# Patient Record
Sex: Female | Born: 1940 | Race: White | Hispanic: No | Smoking: Former smoker
Health system: Southern US, Community
[De-identification: ages and names within clinical notes are randomized; demographics above are authoritative.]

## PROBLEM LIST (undated history)

## (undated) DIAGNOSIS — K5730 Diverticulosis of large intestine without perforation or abscess without bleeding: Secondary | ICD-10-CM

## (undated) DIAGNOSIS — I2510 Atherosclerotic heart disease of native coronary artery without angina pectoris: Secondary | ICD-10-CM

## (undated) DIAGNOSIS — N390 Urinary tract infection, site not specified: Secondary | ICD-10-CM

## (undated) DIAGNOSIS — K272 Acute peptic ulcer, site unspecified, with both hemorrhage and perforation: Secondary | ICD-10-CM

## (undated) DIAGNOSIS — I5189 Other ill-defined heart diseases: Secondary | ICD-10-CM

## (undated) DIAGNOSIS — M1990 Unspecified osteoarthritis, unspecified site: Secondary | ICD-10-CM

## (undated) DIAGNOSIS — G4733 Obstructive sleep apnea (adult) (pediatric): Secondary | ICD-10-CM

## (undated) DIAGNOSIS — R2689 Other abnormalities of gait and mobility: Secondary | ICD-10-CM

## (undated) DIAGNOSIS — K921 Melena: Secondary | ICD-10-CM

## (undated) DIAGNOSIS — J45909 Unspecified asthma, uncomplicated: Secondary | ICD-10-CM

## (undated) DIAGNOSIS — J4489 Other specified chronic obstructive pulmonary disease: Secondary | ICD-10-CM

## (undated) DIAGNOSIS — E854 Organ-limited amyloidosis: Secondary | ICD-10-CM

## (undated) DIAGNOSIS — K439 Ventral hernia without obstruction or gangrene: Secondary | ICD-10-CM

## (undated) DIAGNOSIS — I680 Cerebral amyloid angiopathy: Secondary | ICD-10-CM

## (undated) DIAGNOSIS — B009 Herpesviral infection, unspecified: Secondary | ICD-10-CM

## (undated) DIAGNOSIS — J449 Chronic obstructive pulmonary disease, unspecified: Secondary | ICD-10-CM

## (undated) HISTORY — DX: Organ-limited amyloidosis: I68.0

## (undated) HISTORY — DX: Other specified chronic obstructive pulmonary disease: J44.89

## (undated) HISTORY — DX: Obstructive sleep apnea (adult) (pediatric): G47.33

## (undated) HISTORY — DX: Acute peptic ulcer, site unspecified, with both hemorrhage and perforation: K27.2

## (undated) HISTORY — DX: Urinary tract infection, site not specified: N39.0

## (undated) HISTORY — PX: BUNIONECTOMY: SHX129

## (undated) HISTORY — DX: Chronic obstructive pulmonary disease, unspecified: J44.9

## (undated) HISTORY — DX: Other abnormalities of gait and mobility: R26.89

## (undated) HISTORY — DX: Morbid (severe) obesity due to excess calories: E66.01

## (undated) HISTORY — PX: TOTAL HIP ARTHROPLASTY: SHX124

## (undated) HISTORY — DX: Organ-limited amyloidosis: E85.4

## (undated) HISTORY — DX: Atherosclerotic heart disease of native coronary artery without angina pectoris: I25.10

## (undated) HISTORY — DX: Other ill-defined heart diseases: I51.89

## (undated) HISTORY — PX: APPENDECTOMY: SHX54

## (undated) HISTORY — PX: UMBILICAL HERNIA REPAIR: SHX196

## (undated) HISTORY — DX: Diverticulosis of large intestine without perforation or abscess without bleeding: K57.30

## (undated) HISTORY — DX: Unspecified osteoarthritis, unspecified site: M19.90

## (undated) HISTORY — PX: FOOT SURGERY: SHX648

## (undated) HISTORY — PX: TUBAL LIGATION: SHX77

## (undated) HISTORY — DX: Melena: K92.1

## (undated) HISTORY — DX: Herpesviral infection, unspecified: B00.9

## (undated) HISTORY — DX: Ventral hernia without obstruction or gangrene: K43.9

---

## 1969-01-21 HISTORY — PX: COSMETIC SURGERY: SHX468

## 1998-02-02 ENCOUNTER — Ambulatory Visit (HOSPITAL_COMMUNITY): Admission: RE | Admit: 1998-02-02 | Discharge: 1998-02-02 | Payer: Self-pay | Admitting: Internal Medicine

## 1999-05-10 ENCOUNTER — Encounter: Payer: Self-pay | Admitting: Internal Medicine

## 1999-05-10 ENCOUNTER — Ambulatory Visit (HOSPITAL_COMMUNITY): Admission: RE | Admit: 1999-05-10 | Discharge: 1999-05-10 | Payer: Self-pay | Admitting: Internal Medicine

## 2000-12-11 ENCOUNTER — Ambulatory Visit (HOSPITAL_COMMUNITY): Admission: RE | Admit: 2000-12-11 | Discharge: 2000-12-11 | Payer: Self-pay | Admitting: Internal Medicine

## 2000-12-11 ENCOUNTER — Encounter: Payer: Self-pay | Admitting: Internal Medicine

## 2002-02-18 ENCOUNTER — Encounter: Payer: Self-pay | Admitting: Internal Medicine

## 2002-02-18 ENCOUNTER — Ambulatory Visit (HOSPITAL_COMMUNITY): Admission: RE | Admit: 2002-02-18 | Discharge: 2002-02-18 | Payer: Self-pay | Admitting: Internal Medicine

## 2005-05-23 HISTORY — PX: TOTAL HIP ARTHROPLASTY: SHX124

## 2007-02-20 ENCOUNTER — Ambulatory Visit: Payer: Self-pay

## 2007-03-07 ENCOUNTER — Ambulatory Visit: Payer: Self-pay

## 2007-10-17 ENCOUNTER — Ambulatory Visit: Payer: Self-pay | Admitting: Family Medicine

## 2007-10-17 DIAGNOSIS — B009 Herpesviral infection, unspecified: Secondary | ICD-10-CM | POA: Insufficient documentation

## 2007-10-17 DIAGNOSIS — K921 Melena: Secondary | ICD-10-CM | POA: Insufficient documentation

## 2007-10-17 DIAGNOSIS — K439 Ventral hernia without obstruction or gangrene: Secondary | ICD-10-CM | POA: Insufficient documentation

## 2007-10-22 ENCOUNTER — Ambulatory Visit: Payer: Self-pay | Admitting: Gastroenterology

## 2007-10-29 ENCOUNTER — Ambulatory Visit: Payer: Self-pay | Admitting: Gastroenterology

## 2007-11-26 DIAGNOSIS — K5730 Diverticulosis of large intestine without perforation or abscess without bleeding: Secondary | ICD-10-CM | POA: Insufficient documentation

## 2007-11-27 ENCOUNTER — Ambulatory Visit: Payer: Self-pay | Admitting: Gastroenterology

## 2007-11-27 LAB — CONVERTED CEMR LAB
AST: 20 units/L (ref 0–37)
Alkaline Phosphatase: 84 units/L (ref 39–117)
BUN: 18 mg/dL (ref 6–23)
Basophils Absolute: 0 10*3/uL (ref 0.0–0.1)
Basophils Relative: 0.9 % (ref 0.0–1.0)
CO2: 28 meq/L (ref 19–32)
Chloride: 104 meq/L (ref 96–112)
Eosinophils Absolute: 0.4 10*3/uL (ref 0.0–0.7)
Ferritin: 37.2 ng/mL (ref 10.0–291.0)
GFR calc non Af Amer: 89 mL/min
HCT: 39.6 % (ref 36.0–46.0)
Hemoglobin: 13.6 g/dL (ref 12.0–15.0)
MCHC: 34.5 g/dL (ref 30.0–36.0)
Monocytes Relative: 11.6 % (ref 3.0–12.0)
Potassium: 4.1 meq/L (ref 3.5–5.1)
RBC: 4.39 M/uL (ref 3.87–5.11)
RDW: 13.4 % (ref 11.5–14.6)
TSH: 2.29 microintl units/mL (ref 0.35–5.50)
Total Bilirubin: 0.9 mg/dL (ref 0.3–1.2)
Vitamin B-12: 404 pg/mL (ref 211–911)

## 2007-12-17 ENCOUNTER — Ambulatory Visit: Payer: Self-pay | Admitting: Family Medicine

## 2007-12-31 ENCOUNTER — Ambulatory Visit: Payer: Self-pay | Admitting: Family Medicine

## 2008-01-02 LAB — CONVERTED CEMR LAB: Cholesterol: 229 mg/dL (ref 0–200)

## 2008-01-21 ENCOUNTER — Ambulatory Visit (HOSPITAL_COMMUNITY): Admission: RE | Admit: 2008-01-21 | Discharge: 2008-01-21 | Payer: Self-pay | Admitting: Family Medicine

## 2008-01-22 ENCOUNTER — Encounter (INDEPENDENT_AMBULATORY_CARE_PROVIDER_SITE_OTHER): Payer: Self-pay | Admitting: *Deleted

## 2008-05-21 ENCOUNTER — Telehealth: Payer: Self-pay | Admitting: Family Medicine

## 2008-05-21 ENCOUNTER — Ambulatory Visit: Payer: Self-pay | Admitting: Family Medicine

## 2008-05-21 DIAGNOSIS — J309 Allergic rhinitis, unspecified: Secondary | ICD-10-CM | POA: Insufficient documentation

## 2008-05-21 DIAGNOSIS — M25579 Pain in unspecified ankle and joints of unspecified foot: Secondary | ICD-10-CM | POA: Insufficient documentation

## 2008-06-18 ENCOUNTER — Ambulatory Visit: Payer: Self-pay | Admitting: Family Medicine

## 2008-06-18 ENCOUNTER — Encounter: Payer: Self-pay | Admitting: Family Medicine

## 2008-06-18 ENCOUNTER — Other Ambulatory Visit: Admission: RE | Admit: 2008-06-18 | Discharge: 2008-06-18 | Payer: Self-pay | Admitting: Family Medicine

## 2008-06-18 DIAGNOSIS — E7800 Pure hypercholesterolemia, unspecified: Secondary | ICD-10-CM | POA: Insufficient documentation

## 2008-06-20 ENCOUNTER — Encounter (INDEPENDENT_AMBULATORY_CARE_PROVIDER_SITE_OTHER): Payer: Self-pay | Admitting: *Deleted

## 2008-06-30 ENCOUNTER — Ambulatory Visit: Payer: Self-pay | Admitting: Family Medicine

## 2008-06-30 LAB — CONVERTED CEMR LAB
Bilirubin, Direct: 0.1 mg/dL (ref 0.0–0.3)
Calcium: 9.2 mg/dL (ref 8.4–10.5)
GFR calc Af Amer: 92 mL/min
HDL: 36.1 mg/dL — ABNORMAL LOW (ref >39.0)
Sodium: 139 meq/L (ref 135–145)
Total Bilirubin: 1.1 mg/dL (ref 0.3–1.2)
Total CHOL/HDL Ratio: 5.7
Total Protein: 7.4 g/dL (ref 6.0–8.3)
Triglycerides: 233 mg/dL (ref 0–149)
VLDL: 47 mg/dL — ABNORMAL HIGH (ref 0–40)

## 2008-08-26 ENCOUNTER — Ambulatory Visit: Payer: Self-pay | Admitting: Family Medicine

## 2008-09-30 ENCOUNTER — Encounter (INDEPENDENT_AMBULATORY_CARE_PROVIDER_SITE_OTHER): Payer: Self-pay | Admitting: *Deleted

## 2008-10-13 ENCOUNTER — Encounter: Payer: Self-pay | Admitting: Family Medicine

## 2008-11-04 ENCOUNTER — Encounter: Payer: Self-pay | Admitting: Family Medicine

## 2008-11-10 ENCOUNTER — Encounter: Payer: Self-pay | Admitting: Family Medicine

## 2009-01-27 ENCOUNTER — Ambulatory Visit (HOSPITAL_COMMUNITY): Admission: RE | Admit: 2009-01-27 | Discharge: 2009-01-27 | Payer: Self-pay | Admitting: Family Medicine

## 2009-01-28 ENCOUNTER — Ambulatory Visit: Payer: Self-pay | Admitting: Family Medicine

## 2009-01-29 LAB — CONVERTED CEMR LAB
Cholesterol: 188 mg/dL (ref 0–200)
LDL Cholesterol: 113 mg/dL — ABNORMAL HIGH (ref 0–99)
Triglycerides: 182 mg/dL — ABNORMAL HIGH (ref 0.0–149.0)
VLDL: 36.4 mg/dL (ref 0.0–40.0)

## 2009-03-02 ENCOUNTER — Encounter: Payer: Self-pay | Admitting: Family Medicine

## 2009-03-25 ENCOUNTER — Ambulatory Visit: Payer: Self-pay | Admitting: Family Medicine

## 2009-03-25 DIAGNOSIS — M79609 Pain in unspecified limb: Secondary | ICD-10-CM | POA: Insufficient documentation

## 2009-03-25 LAB — CONVERTED CEMR LAB
Ketones, urine, test strip: NEGATIVE
Nitrite: POSITIVE
Specific Gravity, Urine: 1.015
Urobilinogen, UA: 0.2

## 2009-04-15 ENCOUNTER — Telehealth: Payer: Self-pay | Admitting: Family Medicine

## 2009-06-04 ENCOUNTER — Telehealth: Payer: Self-pay | Admitting: Family Medicine

## 2009-07-13 ENCOUNTER — Ambulatory Visit: Payer: Self-pay | Admitting: Family Medicine

## 2009-07-14 LAB — CONVERTED CEMR LAB
Cholesterol: 162 mg/dL (ref 0–200)
HDL: 47.3 mg/dL (ref >39.00)
LDL Cholesterol: 96 mg/dL (ref 0–99)
Triglycerides: 96 mg/dL (ref 0.0–149.0)
VLDL: 19.2 mg/dL (ref 0.0–40.0)

## 2009-07-20 ENCOUNTER — Ambulatory Visit: Payer: Self-pay | Admitting: Family Medicine

## 2009-07-20 DIAGNOSIS — M259 Joint disorder, unspecified: Secondary | ICD-10-CM | POA: Insufficient documentation

## 2009-10-21 ENCOUNTER — Ambulatory Visit: Payer: Self-pay | Admitting: Family Medicine

## 2009-10-21 DIAGNOSIS — J018 Other acute sinusitis: Secondary | ICD-10-CM | POA: Insufficient documentation

## 2009-12-08 ENCOUNTER — Ambulatory Visit: Payer: Self-pay | Admitting: Family Medicine

## 2009-12-21 ENCOUNTER — Ambulatory Visit: Payer: Self-pay | Admitting: Family Medicine

## 2009-12-21 DIAGNOSIS — Z96649 Presence of unspecified artificial hip joint: Secondary | ICD-10-CM | POA: Insufficient documentation

## 2009-12-24 ENCOUNTER — Telehealth (INDEPENDENT_AMBULATORY_CARE_PROVIDER_SITE_OTHER): Payer: Self-pay | Admitting: *Deleted

## 2009-12-29 ENCOUNTER — Telehealth: Payer: Self-pay | Admitting: Family Medicine

## 2010-01-05 ENCOUNTER — Encounter: Payer: Self-pay | Admitting: Family Medicine

## 2010-01-19 ENCOUNTER — Encounter: Payer: Self-pay | Admitting: Family Medicine

## 2010-01-19 ENCOUNTER — Telehealth: Payer: Self-pay | Admitting: Family Medicine

## 2010-01-22 ENCOUNTER — Telehealth: Payer: Self-pay | Admitting: Family Medicine

## 2010-02-15 ENCOUNTER — Encounter: Payer: Self-pay | Admitting: Family Medicine

## 2010-02-23 ENCOUNTER — Encounter: Payer: Self-pay | Admitting: Family Medicine

## 2010-02-23 ENCOUNTER — Ambulatory Visit: Payer: Self-pay | Admitting: Family Medicine

## 2010-02-23 DIAGNOSIS — G2581 Restless legs syndrome: Secondary | ICD-10-CM | POA: Insufficient documentation

## 2010-02-23 DIAGNOSIS — R0600 Dyspnea, unspecified: Secondary | ICD-10-CM | POA: Insufficient documentation

## 2010-02-23 DIAGNOSIS — R0609 Other forms of dyspnea: Secondary | ICD-10-CM

## 2010-03-02 ENCOUNTER — Ambulatory Visit: Payer: Self-pay | Admitting: Family Medicine

## 2010-03-03 ENCOUNTER — Telehealth: Payer: Self-pay | Admitting: Family Medicine

## 2010-03-05 LAB — CONVERTED CEMR LAB
ALT: 19 units/L (ref 0–35)
AST: 18 units/L (ref 0–37)
Alkaline Phosphatase: 74 units/L (ref 39–117)
Bilirubin, Direct: 0.1 mg/dL (ref 0.0–0.3)
CO2: 25 meq/L (ref 19–32)
Chloride: 104 meq/L (ref 96–112)
Eosinophils Absolute: 0.2 10*3/uL (ref 0.0–0.7)
Ferritin: 30.5 ng/mL (ref 10.0–291.0)
HCT: 39.9 % (ref 36.0–46.0)
Lymphs Abs: 1.4 10*3/uL (ref 0.7–4.0)
MCHC: 34.3 g/dL (ref 30.0–36.0)
MCV: 92.9 fL (ref 78.0–100.0)
Monocytes Absolute: 0.5 10*3/uL (ref 0.1–1.0)
Neutrophils Relative %: 56.7 % (ref 43.0–77.0)
Platelets: 197 10*3/uL (ref 150.0–400.0)
Potassium: 4.3 meq/L (ref 3.5–5.1)
Sodium: 139 meq/L (ref 135–145)
Total CHOL/HDL Ratio: 5
Total Protein: 6.8 g/dL (ref 6.0–8.3)
Triglycerides: 118 mg/dL (ref 0.0–149.0)
Vitamin B-12: 264 pg/mL (ref 211–911)

## 2010-03-23 ENCOUNTER — Encounter: Payer: Self-pay | Admitting: Family Medicine

## 2010-03-23 ENCOUNTER — Ambulatory Visit (HOSPITAL_COMMUNITY): Admission: RE | Admit: 2010-03-23 | Discharge: 2010-03-23 | Payer: Self-pay | Admitting: Family Medicine

## 2010-04-05 ENCOUNTER — Telehealth: Payer: Self-pay | Admitting: Family Medicine

## 2010-05-25 ENCOUNTER — Ambulatory Visit: Admit: 2010-05-25 | Payer: Self-pay | Admitting: Family Medicine

## 2010-06-12 ENCOUNTER — Encounter: Payer: Self-pay | Admitting: Family Medicine

## 2010-06-14 ENCOUNTER — Encounter: Payer: Self-pay | Admitting: Family Medicine

## 2010-06-24 NOTE — Progress Notes (Signed)
Summary: tramadol  Phone Note Refill Request Message from:  [employer] on April 05, 2010 1:20 PM  Refills Requested: Medication #1:  tramadol   Supply Requested: 1 month [facility]   Method Requested: Electronic Initial call taken by: Benny Lennert CMA Duncan Dull),  April 05, 2010 1:21 PM  Follow-up for Phone Call        Not on med list...we have not prescribed for her since 05/2009..  call to determine what MD she has been getting this from or if she hasn't had any since 05/2009. Needs to stick with one prescribing MD.  Follow-up by: Kerby Nora MD,  April 05, 2010 10:58 PM  Additional Follow-up for Phone Call Additional follow up Details #1::        Patient has only took the medication you gave her in january she said that she was just taken excidrin and could get by and she said that she has recently started taken these at night. This helps her when she can not stand the pain anymore.Consuello Masse CMA   Additional Follow-up by: Benny Lennert CMA (AAMA),  April 06, 2010 1:00 PM    New/Updated Medications: TRAMADOL HCL 50 MG TABS (TRAMADOL HCL) 1 tab by mouth daily as needed pain Prescriptions: TRAMADOL HCL 50 MG TABS (TRAMADOL HCL) 1 tab by mouth daily as needed pain  #30 x 0   Entered and Authorized by:   Kerby Nora MD   Signed by:   Kerby Nora MD on 04/06/2010   Method used:   Telephoned to ...       [facility]* (retail)       [address]       [place], [place]  [address]       Ph: [phone]       Fax: [phone]   RxID:   [id number] TRAMADOL HCL 50 MG TABS (TRAMADOL HCL) 1 tab by mouth daily as needed pain  #30 x 0   Entered and Authorized by:   Kerby Nora MD   Signed by:   Kerby Nora MD on 04/06/2010   Method used:   Electronically to        [facility]* (retail)       [address]       [place], [place]  [address]       Ph: [phone]       Fax: [phone]   RxID:    [id number]

## 2010-06-24 NOTE — Assessment & Plan Note (Signed)
Summary: ? SINUS INFECTION   Vital Signs:  Patient profile:   70 year old female Height:      62 inches Weight:      178.25 pounds BMI:     32.72 Temp:     98.6 degrees F oral Pulse rate:   88 / minute Pulse rhythm:   regular BP sitting:   140 / 80  (left arm) Cuff size:   regular  Vitals Entered By: Linde Gillis CMA Duncan Dull) (October 21, 2009 3:24 PM) CC: ? sinus infection   History of Present Illness: 70 yo here for almost 2 weeks of sinus pressure and congestion. chills, no fever. No cough. No SOB or wheezing. Taking OTC sinus medication and Excedrin. No ear pain. No n/v/d.  Current Medications (verified): 1)  Melatonin 3 Mg  Caps (Melatonin) .... Take 1 Tab By Mouth At Bedtime 2)  Acyclovir 400 Mg Tabs (Acyclovir) .Marland Kitchen.. 1 By Mouth Three Times A Day 3)  Allegra 180 Mg Tabs (Fexofenadine Hcl) .... One Daily 4)  Etodolac 400 Mg Tabs (Etodolac) .... As Directed 5)  Fluticasone Propionate 50 Mcg/act Susp (Fluticasone Propionate) .... 2 Sprays Per Nostril Daily 6)  Amoxicillin 500 Mg Tabs (Amoxicillin) .Marland Kitchen.. 1 Tab By Mouth Two Times A Day X 10 Days  Allergies (verified): No Known Drug Allergies  Review of Systems      See HPI General:  Complains of chills; denies fatigue and fever. ENT:  Complains of postnasal drainage, sinus pressure, and sore throat. Resp:  Denies cough, shortness of breath, sputum productive, and wheezing.  Physical Exam  General:  Non toxic, NAD. Nose:  boggy turbinates. TTP throughout Mouth:  good dentition and pharyngeal erythema.   no exudates Lungs:  Normal respiratory effort, chest expands symmetrically. Lungs are clear to auscultation, no crackles or wheezes. Heart:  Normal rate and regular rhythm. S1 and S2 normal without gallop, murmur, click, rub or other extra sounds. Extremities:  no edmea  Psych:  Cognition and judgment appear intact. Alert and cooperative with normal attention span and concentration. No apparent delusions, illusions,  hallucinations   Impression & Recommendations:  Problem # 1:  OTHER ACUTE SINUSITIS (ICD-461.8) Assessment New Given duration and progression of symptoms, will treat for bacterial sinusitis with amoxicillin. See pt instructions for details. Her updated medication list for this problem includes:    Fluticasone Propionate 50 Mcg/act Susp (Fluticasone propionate) .Marland Kitchen... 2 sprays per nostril daily    Amoxicillin 500 Mg Tabs (Amoxicillin) .Marland Kitchen... 1 tab by mouth two times a day x 10 days  Complete Medication List: 1)  Melatonin 3 Mg Caps (Melatonin) .... Take 1 tab by mouth at bedtime 2)  Acyclovir 400 Mg Tabs (Acyclovir) .Marland Kitchen.. 1 by mouth three times a day 3)  Allegra 180 Mg Tabs (Fexofenadine hcl) .... One daily 4)  Etodolac 400 Mg Tabs (Etodolac) .... As directed 5)  Fluticasone Propionate 50 Mcg/act Susp (Fluticasone propionate) .... 2 sprays per nostril daily 6)  Amoxicillin 500 Mg Tabs (Amoxicillin) .Marland Kitchen.. 1 tab by mouth two times a day x 10 days  Patient Instructions: 1)  Take amoxicillin as directed.  Drink lots of fluids.  Treat sympotmatically with Mucinex, nasal saline irrigation, and Tylenol/Ibuprofen. Also try claritin D or zyrtec D over the counter- two times a day as needed ( have to sign for them at pharmacy). You can use warm compresses.  Cough suppressant at night. Call if not improving as expected in 5-7 days.  Prescriptions: AMOXICILLIN 500 MG TABS (  AMOXICILLIN) 1 tab by mouth two times a day x 10 days  #20 x 0   Entered and Authorized by:   Ruthe Mannan MD   Signed by:   Ruthe Mannan MD on 10/21/2009   Method used:   Electronically to        [facility] (retail)       [address]       [place], [place]  [address]       Ph: [phone]       Fax: [phone]   RxID:   [id number]   Current Allergies (reviewed today): No known allergies

## 2010-06-24 NOTE — Assessment & Plan Note (Signed)
Summary: CHECK LUMP ON SHOULDER/CLE   Vital Signs:  Patient profile:   70 year old female Height:      62 inches Weight:      178.2 pounds BMI:     32.71 Temp:     97.6 degrees F oral Pulse rate:   80 / minute Pulse rhythm:   regular BP sitting:   120 / 72  (left arm) Cuff size:   regular  Vitals Entered By: Benny Lennert CMA Duncan Dull) (July 20, 2009 10:51 AM)  History of Present Illness: Chief complaint check lump on shoulder  2 weeks ago noted lump on right shoulder. No pain in lump, no redness, no discharge. No shoulder  Also a lot of nasal congestion..cannot breath through her nse. No nasal discharge. Using sudafed, allegra. Feels it is due to allergy in house and hair shop..does not get worse outside. 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 Take 1 Tab By Mouth At Bedtime 2)  Acyclovir 400 Mg Tabs (Acyclovir) .Marland Kitchen.. 1 By Mouth Three Times A Day 3)  Allegra 180 Mg Tabs (Fexofenadine Hcl) .... One Daily 4)  Etodolac 400 Mg Tabs (Etodolac) .... As Directed 5)  Prednisone 10 Mg Tabs (Prednisone) .... 3 Tabs By Mouth Daily X 3 Days, Then 2 Tabs By Mouth Daily X 2 Days Then 1 Tab By Mouth Daily X 2 Days 6)  Fluticasone Propionate 50 Mcg/act Susp (Fluticasone Propionate) .... 2 Sprays Per Nostril Daily  Allergies (verified): No Known Drug Allergies PMH-FH-SH reviewed-no changes except  otherwise noted  Review of Systems General:  Denies fatigue and fever. ENT:  Complains of nasal congestion and postnasal drainage; denies decreased hearing, difficulty swallowing, ear discharge, nosebleeds, sinus pressure, and sore throat. CV:  Denies chest pain or discomfort. Resp:  Denies shortness of breath. GI:  Denies abdominal pain.  Physical Exam  General:  Overwieght a[[earinmg femael in NAD Head:  no maxillary sinus ttp Ears:  External ear exam shows no significant lesions or deformities.  Otoscopic examination reveals clear canals, tympanic membranes are intact bilaterally without bulging, retraction, inflammation or discharge. Hearing is grossly normal bilaterally. Nose:  B turbinates swollen and eryhtematous Mouth:  Oral mucosa and oropharynx without lesions or exudates.  Teeth in good repair. Neck:  no carotid bruit or thyromegaly no cervical or supraclavicular lymphadenopathy  Lungs:  Normal respiratory effort, chest expands symmetrically. Lungs are clear to auscultation, no crackles or wheezes. Heart:  Normal rate and regular rhythm. S1 and S2 normal without gallop, murmur, click, rub or other extra sounds. Msk:  right shoulder 1 inch diameter solft mass over AC joint. minimally mobile, no redness, no tenderness. 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 Treat with prednisone taper  and start nasal steroid spray.  Her updated medication list for this problem includes:    Allegra 180 Mg Tabs (Fexofenadine hcl) ..... One daily    Fluticasone Propionate 50 Mcg/act Susp (Fluticasone propionate) .Marland Kitchen... 2 sprays per nostril daily  Problem # 2:  OTHER SYMPTOMS REFERABLE TO SHOULDER JOINT (ICD-719.61) ? AC synovial cyst vs. subcutaneous cyst. Spoke with Dr. Lajoyce Corners.. he recommends having her  call there office for possible aspiration and cortisone injection for ? arthritis related fluid accumulation.   Complete Medication List: 1)  Melatonin 3 Mg Caps (Melatonin) .... Take 1 tab by mouth at bedtime 2)  Acyclovir 400 Mg Tabs (Acyclovir) .Marland Kitchen.. 1 by mouth three times a day 3)  Allegra 180 Mg Tabs (Fexofenadine hcl) .... One daily 4)  Etodolac 400 Mg Tabs (Etodolac) .... As directed 5)  Prednisone 10 Mg Tabs (Prednisone) .... 3 tabs by mouth daily x 3 days, then 2 tabs by mouth daily x 2 days then 1 tab by mouth daily x 2 days 6)  Fluticasone Propionate 50 Mcg/act Susp (Fluticasone propionate) .... 2 sprays per nostril daily  Patient Instructions: 1)  Start nasal steroid spray, and prednsione taper . 2)  Stop nasal afrin medicaiton. 3)  Call if no relief..do not start Afrin back. 4)  Call Dr. Lajoyce Corners for further eval of ? [facility] joint arthritis/cyst right shoulder.  Prescriptions: FLUTICASONE PROPIONATE 50 MCG/ACT SUSP (FLUTICASONE PROPIONATE) 2 sprays per nostril daily  #1 x 11   Entered and Authorized by:   Kerby Nora MD   Signed by:   Kerby Nora MD on 07/20/2009   Method used:   Electronically to        [facility]* (retail)       [address]       [place], [place]  [address]       Ph: [phone]       Fax: [phone]   RxID:   [id number] PREDNISONE 10 MG TABS (PREDNISONE) 3 tabs by mouth daily x 3 days, then 2 tabs by mouth daily x 2 days then 1 tab by mouth daily x 2 days  #15 x 0   Entered and Authorized by:   Kerby Nora MD   Signed by:   Kerby Nora MD on 07/20/2009   Method used:   Electronically to        [facility]* (retail)       [address]       [place], [place]  [address]       Ph: [phone]       Fax: [phone]   RxID:   [id number]   Current Allergies (reviewed today): No known allergies

## 2010-06-24 NOTE — Letter (Signed)
Summary: [employer] Additional Documentation Request   Imported By: Beau Fanny 01/07/2010 08:59:46  _____________________________________________________________________  External Attachment:    Type:   Image     Comment:   External Document

## 2010-06-24 NOTE — Progress Notes (Signed)
Summary: wants script for RLS  Phone Note Call from Patient Call back at [phone]   Caller: Patient Call For: Kerby Nora MD Summary of Call: Pt is requesting generic requip for restless leggs,  she wants to take 2 every night. She says she has discussed this with you before.   Uses [facility]. Initial call taken by: Lowella Petties CMA,  March 03, 2010 3:12 PM  Follow-up for Phone Call        I don't know what she means take two at night...we will try 3 mg at bedtime which is typical treatment dose.  Follow-up by: Kerby Nora MD,  March 05, 2010 8:27 AM  Additional Follow-up for Phone Call Additional follow up Details #1::        Patient advised.Consuello Masse CMA   Additional Follow-up by: Benny Lennert CMA Duncan Dull),  March 05, 2010 8:36 AM    New/Updated Medications: ROPINIROLE HCL 3 MG TABS (ROPINIROLE HCL) 1 tab by mouth at bedtime as needed restless legs. Prescriptions: ROPINIROLE HCL 3 MG TABS (ROPINIROLE HCL) 1 tab by mouth at bedtime as needed restless legs.  #30 x 3   Entered and Authorized by:   Kerby Nora MD   Signed by:   Kerby Nora MD on 03/05/2010   Method used:   Electronically to        [facility]* (retail)       [address]       [place], [place]  [address]       Ph: [phone]       Fax: [phone]   RxID:   [id number]

## 2010-06-24 NOTE — Letter (Signed)
Summary: [employer] Progress Report/[employer]   Imported By: Lanelle Bal 02/24/2010 10:31:30  _____________________________________________________________________  External Attachment:    Type:   Image     Comment:   External Document

## 2010-06-24 NOTE — Progress Notes (Signed)
Summary: regarding paper work for scooter  Phone Note Other Incoming   Caller: Shauna at the scooter store  [phone] x [phone] Summary of Call: Sales rep is calling regarding status of paper work on patients scooter.  Has this been done?               Lowella Petties CMA  December 29, 2009 12:02 PM   Follow-up for Phone Call        have faxed this multiple times ,called sales rep and got alternate fax number and will call back later to see if she got this information Follow-up by: Benny Lennert CMA Duncan Dull),  December 29, 2009 12:37 PM  Additional Follow-up for Phone Call Additional follow up Details #1::        shuna will call back when info recieved.Consuello Masse CMA   Additional Follow-up by: Benny Lennert CMA Duncan Dull),  December 29, 2009 12:52 PM    Additional Follow-up for Phone Call Additional follow up Details #2::    sales rep got information Follow-up by: Benny Lennert CMA Duncan Dull),  December 29, 2009 2:28 PM

## 2010-06-24 NOTE — Progress Notes (Signed)
Summary: Rx Ultracet  Phone Note Refill Request Call back at [phone] Message from:  [facility] on June 04, 2009 10:16 AM  Refills Requested: Medication #1:  DARVOCET-N 100 100-650 MG TABS 1 tab by mouth q 6 hours as needed breakthrough pain Darvocet off the market, patient would like to use Ultracet.  Please advise   Method Requested: Pick up at Office Initial call taken by: Benny Lennert CMA Duncan Dull),  June 04, 2009 10:17 AM  Follow-up for Phone Call        Notify pt that ultracet is brand and not on her formulary... it is the equivalent to tramadol with tylenol...to make more affordable, will send in tramadol and she can use tylenol OTC as needed.  Okay to call in ultracet if she prefers in same quantity.   Additional Follow-up for Phone Call Additional follow up Details #1::        Called patient at work number line busy x 3, will call again later.  Tried reaching her at home and her husband said she was at work.   Linde Gillis CMA Duncan Dull)  June 04, 2009 12:44 PM   Rx called to pharmacy and patient notified. Additional Follow-up by: Linde Gillis CMA Duncan Dull),  June 04, 2009 2:27 PM    New/Updated Medications: TRAMADOL HCL 50 MG TABS (TRAMADOL HCL) 1 tab by mouth every 6 hours as needed  for pain, limit use Prescriptions: TRAMADOL HCL 50 MG TABS (TRAMADOL HCL) 1 tab by mouth every 6 hours as needed  for pain, limit use  #30 x 0   Entered and Authorized by:   Kerby Nora MD   Signed by:   Kerby Nora MD on 06/04/2009   Method used:   Telephoned to ...       [facility]* (retail)       [address]       [place], [place]  [address]       Ph: [phone]       Fax: [phone]   RxID:   [id number]

## 2010-06-24 NOTE — Letter (Signed)
Summary: Product Description Form/[employer]   Imported By: Lanelle Bal 01/27/2010 13:19:56  _____________________________________________________________________  External Attachment:    Type:   Image     Comment:   External Document

## 2010-06-24 NOTE — Progress Notes (Signed)
Summary: form from scooter store  Phone Note Other Incoming   Caller: Shauna at the scooter store Summary of Call: Form is on your desk for signature.  This is the last paper they need signed so that pt can get her scooter.  They are asking to have this faxed back to them today. Initial call taken by: Lowella Petties CMA,  January 19, 2010 10:11 AM     Appended Document: form from scooter store Patient called wanting to know status of fax for scooter.  Spoke with Dr. Ermalene Searing, she does have the form and will complete it at the end of the day today.  Notified patients spouse via telephone.

## 2010-06-24 NOTE — Progress Notes (Signed)
Summary: scooter store needs a letter  Phone Note Other Incoming   Caller: Scooter store- [employer]  [phone] Summary of Call: The scooter store needs a letter from you stating that pt is basically chair bound.  Pt says she spends the majority of her time in a chair, due to difficulty with her getting around, and due to pain. Needs this so that insurance will approve scooter.  Please fax to [phone] Initial call taken by: Lowella Petties CMA,  January 22, 2010 3:53 PM  Follow-up for Phone Call        Please compose letter I will edit and sign.  Follow-up by: Kerby Nora MD,  January 22, 2010 4:07 PM  Additional Follow-up for Phone Call Additional follow up Details #1::        [facility] [employer] called back, said they have gotten their authorization from the insurance company and they wont need that letter. Additional Follow-up by: Lowella Petties CMA,  January 22, 2010 4:44 PM

## 2010-06-24 NOTE — Assessment & Plan Note (Signed)
Summary: legs feel jumpy/dlo   Vital Signs:  Patient profile:   70 year old female Height:      62 inches Weight:      184.0 pounds BMI:     33.78 Temp:     98.2 degrees F oral Pulse rate:   70 / minute Pulse rhythm:   regular BP sitting:   116 / 80  (left arm) Cuff size:   regular  Vitals Entered By: Benny Lennert CMA Duncan Dull) (February 23, 2010 11:01 AM)  History of Present Illness: Chief complaint legs feel jumpy  She feels that she has restless legs. Creepy crawly feeling some. Occ cramps.  Legs feel jumpy when resting at night..interfering with sleep at night.  Cannot sleep well at night.  COPD.Marland KitchenMarland KitchenHas noted when standing in shower..she feels SOB.  Some gradual increase in SOB with exertion in past 6 months to 1 year. No chest pain. No cough, no wheezing.  Mother with idiopathic respiratory fibrosis. No longer smoking but has many year history.  She feels she is due for DXA.  Problems Prior to Update: 1)  Dyspnea  (ICD-786.05) 2)  Special Screening For Osteoporosis  (ICD-V82.81) 3)  Restless Leg Syndrome  (ICD-333.94) 4)  Hip Replacement, Left, Hx of  (ICD-V43.64) 5)  Other Acute Sinusitis  (ICD-461.8) 6)  Other Symptoms Referable To Shoulder Joint  (ICD-719.61) 7)  Foot Pain, Bilateral  (ICD-729.5) 8)  Hypercholesterolemia  (ICD-272.0) 9)  Ankle Pain, Bilateral  (ICD-719.47) 10)  Allergic Rhinitis  (ICD-477.9) 11)  Screening For Lipoid Disorders  (ICD-V77.91) 12)  Other Screening Mammogram  (ICD-V76.12) 13)  Diverticulosis, Colon  (ICD-562.10) 14)  Hematochezia  (ICD-578.1) 15)  Herpes Simplex Infection, 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 Take 3 Tab By Mouth At Bedtime 2)  Acyclovir 400 Mg Tabs (Acyclovir) .Marland Kitchen.. 1 By Mouth Three Times A Day 3)  Etodolac 400 Mg Tabs (Etodolac) .... As Directed 4)  Fluticasone Propionate 50 Mcg/act Susp  (Fluticasone Propionate) .... 2 Sprays Per Nostril Daily  Allergies (verified): No Known Drug Allergies  Past History:  Past medical, surgical, family and social histories (including risk factors) reviewed, and no changes noted (except as noted below).  Past Medical History: Reviewed history from 11/27/2007 and no changes required. Current Problems:  DIVERTICULOSIS, COLON (ICD-562.10) HEMATOCHEZIA (ICD-578.1) HERPES SIMPLEX INFECTION, RECURRENT (ICD-054.9) VENTRAL HERNIA (ICD-553.20) COPD (ICD-496)  Past Surgical History: Reviewed history from 10/17/2007 and no changes required. B bunionectomy 2007 left hip replacement Appendectomy Tubal ligation tummy tuck 1970s  Family History: Reviewed history from 10/17/2007 and no changes required. father: CAD, Dm, kidney failure mother: idiopathic resp fibrosis, DM, CAD siblings healthy no cancer no MI less than 13  Social History: Reviewed history from 10/17/2007 and no changes required. Occupation: owns [employer] Alcohol use-yes, 2-3 glasses of wine a night Married 3 kids: healthy former smoker, 40 pack years  Regular exercise-no Diet: poor, skips meals, some fruit and veggies  Review of Systems General:  Complains of fatigue; denies fever. CV:  Denies chest pain or discomfort. Resp:  Complains of shortness of breath; denies cough, sputum productive, and wheezing. GI:  Denies abdominal pain. GU:  Denies dysuria.  Physical Exam  General:  NAD overweight  Ears:  External ear exam shows no significant lesions or deformities.  Otoscopic examination reveals clear canals, tympanic membranes are intact bilaterally without bulging, retraction, inflammation or  discharge. Hearing is grossly normal bilaterally. Nose:  External nasal examination shows no deformity or inflammation. Nasal mucosa are pink and moist without lesions or exudates. Mouth:  Oral mucosa and oropharynx without lesions or exudates.  Teeth in good  repair. Neck:  no carotid bruit or thyromegaly no cervical or supraclavicular lymphadenopathy  Lungs:  Normal respiratory effort, chest expands symmetrically. Lungs are clear to auscultation, no crackles or wheezes. Heart:  Normal rate and regular rhythm. S1 and S2 normal without gallop, murmur, click, rub or other extra sounds. Pulses:  R and L posterior tibial pulses are full and equal bilaterally  Extremities:  no edema  Skin:  Intact without suspicious lesions or rashes Psych:  Cognition and judgment appear intact. Alert and cooperative with normal attention span and concentration. No apparent delusions, illusions, hallucinations   Impression & Recommendations:  Problem # 1:  DYSPNEA (ICD-786.05)  PFTs today are in nml range...no clear COPD . no treatment needed. Will eval for other causes of SOB such as anemia and thyroid issue. No clear cardiac source/chest pain, but if labs are normal consider EKG/stress test/ECHO to eval further.  Orders: Spirometry w/Graph (94010)  Problem # 2:  RESTLESS LEG SYNDROME, MILD (ICD-333.94) Will eval with labs for secondary causes. Will try mustard for leg cramping. if not improving and labs nml...can cosider ropinorole to treat.   Problem # 3:  SPECIAL SCREENING FOR OSTEOPOROSIS (ICD-V82.81) Scheduled DXA.  Orders: Radiology Referral (Radiology)  Complete Medication List: 1)  Melatonin 3 Mg Caps (Melatonin) .... Take 3 tab by mouth at bedtime 2)  Acyclovir 400 Mg Tabs (Acyclovir) .Marland Kitchen.. 1 by mouth three times a day 3)  Etodolac 400 Mg Tabs (Etodolac) .... As directed 4)  Fluticasone Propionate 50 Mcg/act Susp (Fluticasone propionate) .... 2 sprays per nostril daily  Patient Instructions: 1)  Schedule in 1 month  CPX. 2)   Fasting lipids, CMET, TSH, cbc ferritin, B12 Dx 272.0, 333.94 3)  Can use 1 tsp mustard  for cramping. 4)  Referral Appointment Information 5)  Day/Date: 6)  Time: 7)  Place/MD: 8)  Address: 9)  Phone/Fax: 10)  Patient  given appointment information. Information/Orders faxed/mailed.   Current Allergies (reviewed today): No known allergies

## 2010-06-24 NOTE — Progress Notes (Signed)
  Phone Note Other Incoming   Request: Send information Action Taken: Software engineer of Call: Received an Attending Physician's Statement from [employer] requesting records. Request forwarded to [facility].

## 2010-06-24 NOTE — Assessment & Plan Note (Signed)
Summary: ACU FOR REACCURING SINUS INFECTION ANTI-BIOTIC IS NOT WORKING...   Vital Signs:  Patient profile:   70 year old female Weight:      178.25 pounds Temp:     98.7 degrees F oral Pulse rate:   70 / minute Pulse rhythm:   regular BP sitting:   140 / 80  (left arm) Cuff size:   regular  Vitals Entered ByJanee Morn CMA (December 08, 2009 9:51 AM) CC: Sinus infection   History of Present Illness: Seen 6/1 for acute sinusitis....given 10 days of amoxicillin.  Also took 24 amoxicillin  500 mg that she has on hand  Some improvement but continued congestion..purulent nasal disharge.  Left facial pain.  no fever. No ear pain.  no cough, minimal SOB.  No wheeze.    Tried sudafed x 7-10 days. Doing nasal saline irrigation, no mucinex.    ASlo of note...she is looking into buying a scooter...due to chronic ankle pain (no surgivcal otions..she cannot walk further than 200 feet without sitting. Not currently interested in wheelchair, walker, cane.  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 Take 3 Tab By Mouth At Bedtime 2)  Acyclovir 400 Mg Tabs (Acyclovir) .Marland Kitchen.. 1 By Mouth Three Times A Day 3)  Etodolac 400 Mg Tabs (Etodolac) .... As Directed 4)  Fluticasone Propionate 50 Mcg/act Susp (Fluticasone Propionate) .... 2 Sprays Per Nostril  Daily  Allergies (verified): No Known Drug Allergies  Past History:  Past medical, surgical, family and social histories (including risk factors) reviewed, and no changes noted (except as noted below).  Past Medical History: Reviewed history from 11/27/2007 and no changes required. Current Problems:  DIVERTICULOSIS, COLON (ICD-562.10) HEMATOCHEZIA (ICD-578.1) HERPES SIMPLEX INFECTION, RECURRENT (ICD-054.9) VENTRAL HERNIA (ICD-553.20) COPD (ICD-496)  Past Surgical History: Reviewed history from 10/17/2007 and no changes required. B bunionectomy 2007 left hip replacement Appendectomy Tubal ligation tummy tuck 1970s  Family History: Reviewed history from 10/17/2007 and no changes required. father: CAD, Dm, kidney failure mother: idiopathic resp fibrosis, DM, CAD siblings healthy no cancer no MI less than 57  Social History: Reviewed history from 10/17/2007 and no changes required. Occupation: owns [employer] Alcohol use-yes, 2-3 glasses of wine a night Married 3 kids: healthy former smoker, 40 pack years  Regular exercise-no Diet: poor, skips meals, some fruit and veggies  Review of Systems General:  Denies fatigue and fever. CV:  Denies chest pain or discomfort. Resp:  Denies wheezing.  Physical Exam  General:  Non toxic, NAD. Head:  left  maxillary sinus ttp Ears:  External ear exam shows no significant lesions or deformities.  Otoscopic examination reveals clear canals, tympanic membranes are intact bilaterally without bulging, retraction, inflammation or discharge. Hearing is grossly normal bilaterally. Nose:  boggy turbinates. TTP throughout Mouth:  good dentition and pharyngeal erythema.   no exudates Neck:  no carotid bruit or  thyromegaly no cervical or supraclavicular lymphadenopathy  Lungs:  Normal respiratory effort, chest expands symmetrically. Lungs are clear to auscultation, no crackles or wheezes. Heart:  Normal rate and regular rhythm. S1 and  S2 normal without gallop, murmur, click, rub or other extra sounds.   Impression & Recommendations:  Problem # 1:  OTHER ACUTE SINUSITIS (ICD-461.8) Persistant sinus infection...broaden antibitoic. Start mucolytic. OCntinue nasal saline irrigation.  Her updated medication list for this problem includes:    Fluticasone Propionate 50 Mcg/act Susp (Fluticasone propionate) .Marland Kitchen... 2 sprays per nostril daily    Avelox 400 Mg Tabs (Moxifloxacin hcl) .Marland Kitchen... 1 tab by mouth daily x 10 days  Complete Medication List: 1)  Melatonin 3 Mg Caps (Melatonin) .... Take 3 tab by mouth at bedtime 2)  Acyclovir 400 Mg Tabs (Acyclovir) .Marland Kitchen.. 1 by mouth three times a day 3)  Etodolac 400 Mg Tabs (Etodolac) .... As directed 4)  Fluticasone Propionate 50 Mcg/act Susp (Fluticasone propionate) .... 2 sprays per nostril daily 5)  Avelox 400 Mg Tabs (Moxifloxacin hcl) .Marland Kitchen.. 1 tab by mouth daily x 10 days  Patient Instructions: 1)  Continue nasal saline. 2)   Mucinex no decongestant two times a day . 3)  Complete antibiotics. 4)   Call if not improving as expected.  Prescriptions: AVELOX 400 MG TABS (MOXIFLOXACIN HCL) 1 tab by mouth daily x 10 days  #10 x 0   Entered and Authorized by:   Kerby Nora MD   Signed by:   Kerby Nora MD on 12/08/2009   Method used:   Electronically to        [facility]* (retail)       [address]       [place], [place]  [address]       Ph: [phone]       Fax: [phone]   RxID:   [id number]   Current Allergies (reviewed today): No known allergies

## 2010-06-24 NOTE — Assessment & Plan Note (Signed)
Summary: DISCUSS MOBILITY/RBH   Vital Signs:  Patient profile:   70 year old female Height:      62 inches Weight:      181.0 pounds BMI:     33.22 Temp:     97.9 degrees F oral Pulse rate:   70 / minute Pulse rhythm:   regular BP sitting:   118 / 78  (left arm) Cuff size:   regular  Vitals Entered By: Benny Lennert CMA Duncan Dull) (December 21, 2009 10:48 AM)  History of Present Illness: Chief complaint discuss mobility   Chronic ankle and foot  pain. Dx 10 years ago  Worse in last 2 years.  She has seen Dr. 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  She has multiple steroid injections, minimally helpful..last B  injections were in 10/2009. Waers daily B ankle stabilizing.orthosis  She also has hx of left hip replacement 4 years ago, also right knee arthritis.  No surgical otions..she cannot walk further than 200 feet without sitting. She has poor balance and poor endurance.  She cannot stand longer than 5-10 minutes.  Needs shower stool prescription.Marland Kitchenlifestime need.  Also needs prescription for scooter to use in public places.  Use walker or cane when walking in strange places,,,keeps in car.  Feels some SOB with COPD.Marland Kitchenin past..unable to exert herself for example with wheelchair. Worse SPOB with exeriton in last 2 months..cannot afford PFTs at this time.  No current chest pain.   Has bar to hold onto in bathtub  Allergies (verified): No Known Drug Allergies  Review of Systems General:  Denies fatigue and fever. CV:  Denies chest pain or discomfort. Resp:  Complains of shortness of breath; denies cough, sputum productive, and wheezing. GI:  Denies abdominal pain. GU:  Denies dysuria.  Physical Exam  General:  Non toxic, NAD overweight  Mouth:  Oral mucosa and oropharynx without lesions or exudates.  Teeth in good repair. Neck:  no carotid bruit or thyromegaly no  cervical or supraclavicular lymphadenopathy  Lungs:  Normal respiratory effort, chest expands symmetrically. Lungs are clear to auscultation, no crackles or wheezes. Heart:  Normal rate and regular rhythm. S1 and S2 normal without gallop, murmur, click, rub or other extra sounds. Msk:  B deformity of ankles anf feet, severe ankle pronation  pain diffuse palpation B ankles and feet.   Wearing B braces  antalgic gait no swelling B ankles Pulses:  R and L posterior tibial pulses are full and equal bilaterally  Neurologic:  No cranial nerve deficits noted. Station and gait are normal. Plantar reflexes are down-going bilaterally. DTRs are symmetrical throughout. Sensory, motor and coordinative functions appear intact.   Impression & Recommendations:  Problem # 1:  FOOT PAIN, BILATERAL (ICD-729.5) Total visit time > 50% spent counseling and cordinating patients care  completed mobiltiy paperwork.  Pt has a lifestime need of a moterized scooter due to severe foot and ankle issues ..no surgical indications. She is unable to use walker, cance or wheelchair manual due to shortness of breath with exertion die to COPD history.  Sees ORTHO, Dr. Lajoyce Corners.  Problem # 2:  ANKLE PAIN, BILATERAL (ICD-719.47)  Problem # 3:  COPD (ICD-496) Needs reeval when able with PFTs to determine if further meds needed such as spiriva.   Complete Medication List: 1)  Melatonin 3 Mg Caps (Melatonin) .... Take 3 tab by mouth at bedtime 2)  Acyclovir 400 Mg  Tabs (Acyclovir) .Marland Kitchen.. 1 by mouth three times a day 3)  Etodolac 400 Mg Tabs (Etodolac) .... As directed 4)  Fluticasone Propionate 50 Mcg/act Susp (Fluticasone propionate) .... 2 sprays per nostril daily  Current Allergies (reviewed today): No known allergies

## 2010-08-23 ENCOUNTER — Other Ambulatory Visit: Payer: Self-pay | Admitting: Family Medicine

## 2010-08-25 ENCOUNTER — Other Ambulatory Visit: Payer: Self-pay | Admitting: Family Medicine

## 2010-08-30 ENCOUNTER — Ambulatory Visit (INDEPENDENT_AMBULATORY_CARE_PROVIDER_SITE_OTHER): Payer: Medicare Other | Admitting: Family Medicine

## 2010-08-30 ENCOUNTER — Encounter: Payer: Self-pay | Admitting: Family Medicine

## 2010-08-30 VITALS — BP 120/80 | HR 91 | Temp 98.4°F | Ht 63.0 in | Wt 174.8 lb

## 2010-08-30 DIAGNOSIS — K5730 Diverticulosis of large intestine without perforation or abscess without bleeding: Secondary | ICD-10-CM

## 2010-08-30 DIAGNOSIS — K5792 Diverticulitis of intestine, part unspecified, without perforation or abscess without bleeding: Secondary | ICD-10-CM

## 2010-08-30 DIAGNOSIS — K5732 Diverticulitis of large intestine without perforation or abscess without bleeding: Secondary | ICD-10-CM

## 2010-08-30 MED ORDER — METRONIDAZOLE 500 MG PO TABS: 500.0000 mg | ORAL_TABLET | Freq: Three times a day (TID) | ORAL | Status: AC

## 2010-08-30 MED ORDER — CIPROFLOXACIN HCL 750 MG PO TABS: 750.0000 mg | ORAL_TABLET | Freq: Two times a day (BID) | ORAL | Status: AC

## 2010-08-30 NOTE — Patient Instructions (Signed)
Prevent dehydration: drink Gatorade, Pedialyte, Ginger Ale, popsicles  day 1: clear liquids day 1: clear liquids--2-3 oz every 45-60 min. SMALL SIPS OR ICE CHIPS 7-up, ginger ale, sprite tea--no cofee chicken broth plain jello Water  Day 2: contnue clear liquids and add BRAT diet B--banana R--rice---can use chicken noodle or rice soup A--apple sauce T--dry toast GRITS, CREAM OF WHEAT, OATMEAL OK  Day 3: continue day 2 and add simple, non fat non spicy foods1 at a time to diet as canned peaches or pears backed or broiled chicken breast---or lunch meat boiled white potato-cook in chicken broth Chicken and rice or chicken noodles  Diverticulitis Small pockets or "bubbles" can develop in the wall of the intestine. These are called diverticuli. If they become infected and inflamed, the problem is called diverticulitis. This causes belly pain (usually on the left side). There may also be:   Fever.   Bloating.   Gas.   Feeling sick to your stomach (nausea).   Loss of hunger.  Some people with this problem have watery poop (diarrhea). Others feel that they cannot poop (constipated).  HOME CARE  Only take medicine as told by your doctor. If you have been given medicines (antibiotics) that kill germs, finish all the medicine. Do this even if you feel better.   For 24 to 48 hours, drink only clear liquids, such as:   Water.   Fruit juices with no pulp.   Clear broth.   Frozen ice pops.   Drink enough water and fluids to keep your pee clear or pale yellow.   Eat more fiber (whole grains, fruits, and vegetables). Eat a little more each day as you feel better.   Go to the bathroom when you have the urge to go.   Avoid using medicine that makes you go poop (laxatives).  GET HELP RIGHT AWAY IF YOU:  Are not getting better after taking medicine (antibiotics) for 2 days.   Notice bright red blood in your poop (stool).   Develop very bad belly (abdominal) pain.   You  have a temperature by mouth above 102, not controlled by medicine.   Cannot poop.   Keep throwing up (vomiting).  MAKE SURE YOU:   Understand these instructions.   Will watch your condition.   Will get help right away if you are not doing well or get worse.  Document Released: 10/26/2007 Document Re-Released: 08/03/2009 [facility] Patient Information 2011 [place], [place].

## 2010-08-30 NOTE — Progress Notes (Signed)
70 year old female:  About three years ago, ws bleeding from colon.  Showed up and was having some irritation on her colonoscopy. Eating lots of yogurt. For the last few days. Diarrhea, slimy.  Sore in her abdomen. Trying to massage her stomace.  Like okra - mucous.   Diverticulitis: Patient complains of anorexia, diarrhea, LLQ abdominal pain and mucus in the stool.  The pain is described as aching, and is a few/10 in intensity. Onset was 3 days ago. Symptoms have been stable since. Aggravating factors: activity, bowel movement, eating and movement.  Alleviating factors: none. Associated symptoms: anorexia, diarrhea and nausea. The patient denies chills, constipation, diffuse abdominal pain, fever, hematochezia, melena, night sweats and vomiting.  The PMH, PSH, Social History, Family History, Medications, and allergies have been reviewed in [facility], and have been updated if relevant.  ROS: GEN: No acute illnesses, no fevers, chills. GI: above Pulm: No SOB Interactive and getting along well at home.  Otherwise, ROS is as per the HPI.  GEN: WDWN, NAD, Non-toxic, A & O x 3 HEENT: Atraumatic, Normocephalic. Neck supple. No masses, No LAD. Ears and Nose: No external deformity. CV: RRR, No M/G/R. No JVD. No thrill. No extra heart sounds. PULM: CTA B, no wheezes, crackles, rhonchi. No retractions. No resp. distress. No accessory muscle use. ABD: S, TTP LLQ, ND,  HYPERACTIVE BS. No rebound tenderness. No HSM.  EXTR: No c/c/e NEURO Normal gait.  PSYCH: Normally interactive. Conversant. Not depressed or anxious appearing.  Calm demeanor.   A/P: 1. Diverticulitis in the setting of known diverticulosis. Cipro and Flagyl, bowel rest. Refer to the patient instructions sections for details of plan shared with patient.

## 2010-09-01 ENCOUNTER — Other Ambulatory Visit: Payer: Self-pay | Admitting: Neurosurgery

## 2010-09-01 DIAGNOSIS — M47812 Spondylosis without myelopathy or radiculopathy, cervical region: Secondary | ICD-10-CM

## 2010-09-06 ENCOUNTER — Ambulatory Visit
Admission: RE | Admit: 2010-09-06 | Discharge: 2010-09-06 | Disposition: A | Discharge status: Home / Self Care | Payer: Medicare Other | Source: Ambulatory Visit | Attending: Neurosurgery | Admitting: Neurosurgery

## 2010-09-06 DIAGNOSIS — M47812 Spondylosis without myelopathy or radiculopathy, cervical region: Secondary | ICD-10-CM

## 2010-09-10 ENCOUNTER — Other Ambulatory Visit: Payer: Self-pay | Admitting: Neurosurgery

## 2010-09-10 DIAGNOSIS — M47812 Spondylosis without myelopathy or radiculopathy, cervical region: Secondary | ICD-10-CM

## 2010-09-15 ENCOUNTER — Other Ambulatory Visit: Payer: Self-pay | Admitting: *Deleted

## 2010-09-15 MED ORDER — ACYCLOVIR 400 MG PO TABS: 400.0000 mg | ORAL_TABLET | Freq: Two times a day (BID) | ORAL | Status: DC

## 2010-09-15 NOTE — Telephone Encounter (Signed)
Has this been done?

## 2010-09-20 ENCOUNTER — Ambulatory Visit (INDEPENDENT_AMBULATORY_CARE_PROVIDER_SITE_OTHER): Payer: Medicare Other | Admitting: Family Medicine

## 2010-09-20 ENCOUNTER — Other Ambulatory Visit: Payer: Self-pay | Admitting: Neurosurgery

## 2010-09-20 ENCOUNTER — Encounter: Payer: Self-pay | Admitting: Family Medicine

## 2010-09-20 ENCOUNTER — Ambulatory Visit
Admission: RE | Admit: 2010-09-20 | Discharge: 2010-09-20 | Disposition: A | Discharge status: Home / Self Care | Payer: Medicare Other | Source: Ambulatory Visit | Attending: Neurosurgery | Admitting: Neurosurgery

## 2010-09-20 VITALS — BP 144/82 | HR 88 | Temp 98.5°F | Wt 176.0 lb

## 2010-09-20 DIAGNOSIS — J40 Bronchitis, not specified as acute or chronic: Secondary | ICD-10-CM

## 2010-09-20 DIAGNOSIS — M47812 Spondylosis without myelopathy or radiculopathy, cervical region: Secondary | ICD-10-CM

## 2010-09-20 MED ORDER — AZITHROMYCIN 250 MG PO TABS: ORAL_TABLET | ORAL | Status: DC

## 2010-09-20 NOTE — Patient Instructions (Addendum)
Keep taking the mucinex and tylenol as needed.  If your aren't getting better by the end of the week, then start the antibiotics.  Let us know if you have other concerns.     09/21/2010, letter in patient's chart about being out of work is incorrect and not needed, can't cancel. Please dis-regard, per Dr.Luzmaria Devaux.  D.Smith, CMA

## 2010-09-20 NOTE — Assessment & Plan Note (Addendum)
Nontoxic, possibly viral.  Hold abx in meantime.  Use mucinex, tylenol prn.  Okay for outpatient fu. See instructions.

## 2010-09-20 NOTE — Progress Notes (Signed)
Friday with chest congestion.  Since then with fever, episodically, at night.  Some sputum, yellow.  Rhinorrhea, congested.  Some ear congestion.  Quit smoking 2002.  "I don't feel bad, it's just annoying."    ROS: See HPI.  Otherwise negative.    Meds, vitals, and allergies reviewed.   GEN: nad, alert and oriented, nontoxic.  HEENT: mucous membranes moist, TM w/o erythema, nasal epithelium injected, OP with cobblestoning NECK: supple w/o LA CV: rrr. PULM: no inc wob but scattered ronchi, no wheeze ABD: soft, +bs EXT: no edema

## 2010-10-05 ENCOUNTER — Ambulatory Visit
Admission: RE | Admit: 2010-10-05 | Discharge: 2010-10-05 | Disposition: A | Discharge status: Home / Self Care | Payer: Medicare Other | Source: Ambulatory Visit | Attending: Neurosurgery | Admitting: Neurosurgery

## 2010-10-05 DIAGNOSIS — M47812 Spondylosis without myelopathy or radiculopathy, cervical region: Secondary | ICD-10-CM

## 2010-10-19 ENCOUNTER — Telehealth (INDEPENDENT_AMBULATORY_CARE_PROVIDER_SITE_OTHER): Payer: Medicare Other | Admitting: Family Medicine

## 2010-10-19 ENCOUNTER — Ambulatory Visit: Payer: Medicare Other | Admitting: Family Medicine

## 2010-10-19 DIAGNOSIS — E7800 Pure hypercholesterolemia, unspecified: Secondary | ICD-10-CM

## 2010-10-19 NOTE — Telephone Encounter (Signed)
Message copied by Excell Seltzer on Tue Oct 19, 2010  8:46 AM ------      Message from: Baldomero Lamy      Created: Wed Oct 13, 2010 10:23 AM      Regarding: Cpx labs wed 5/30       Please order  future cpx labs for pt's upcomming lab appt.      Thanks      Rodney Booze

## 2010-10-20 ENCOUNTER — Other Ambulatory Visit (INDEPENDENT_AMBULATORY_CARE_PROVIDER_SITE_OTHER): Payer: Medicare Other | Admitting: Family Medicine

## 2010-10-20 DIAGNOSIS — E7800 Pure hypercholesterolemia, unspecified: Secondary | ICD-10-CM

## 2010-10-20 LAB — LIPID PANEL
Cholesterol: 203 mg/dL — ABNORMAL HIGH (ref 0–200)
VLDL: 19.8 mg/dL (ref 0.0–40.0)

## 2010-10-20 LAB — COMPREHENSIVE METABOLIC PANEL
ALT: 21 U/L (ref 0–35)
CO2: 25 mEq/L (ref 19–32)
Calcium: 8.8 mg/dL (ref 8.4–10.5)
Chloride: 109 mEq/L (ref 96–112)
Creatinine, Ser: 0.8 mg/dL (ref 0.4–1.2)
GFR: 79 mL/min (ref >60.00)
Glucose, Bld: 91 mg/dL (ref 70–99)
Sodium: 140 mEq/L (ref 135–145)
Total Bilirubin: 0.8 mg/dL (ref 0.3–1.2)
Total Protein: 6.7 g/dL (ref 6.0–8.3)

## 2010-10-20 LAB — LDL CHOLESTEROL, DIRECT: Direct LDL: 146.6 mg/dL

## 2010-10-26 ENCOUNTER — Telehealth: Payer: Self-pay | Admitting: Family Medicine

## 2010-10-26 ENCOUNTER — Ambulatory Visit (INDEPENDENT_AMBULATORY_CARE_PROVIDER_SITE_OTHER): Payer: Medicare Other | Admitting: Family Medicine

## 2010-10-26 ENCOUNTER — Ambulatory Visit (INDEPENDENT_AMBULATORY_CARE_PROVIDER_SITE_OTHER)
Admission: RE | Admit: 2010-10-26 | Discharge: 2010-10-26 | Disposition: A | Discharge status: Home / Self Care | Payer: Medicare Other | Source: Ambulatory Visit | Attending: Family Medicine | Admitting: Family Medicine

## 2010-10-26 ENCOUNTER — Encounter: Payer: Self-pay | Admitting: Family Medicine

## 2010-10-26 DIAGNOSIS — R0600 Dyspnea, unspecified: Secondary | ICD-10-CM

## 2010-10-26 DIAGNOSIS — R0609 Other forms of dyspnea: Secondary | ICD-10-CM

## 2010-10-26 DIAGNOSIS — IMO0002 Reserved for concepts with insufficient information to code with codable children: Secondary | ICD-10-CM | POA: Insufficient documentation

## 2010-10-26 DIAGNOSIS — R0989 Other specified symptoms and signs involving the circulatory and respiratory systems: Secondary | ICD-10-CM

## 2010-10-26 DIAGNOSIS — R0602 Shortness of breath: Secondary | ICD-10-CM

## 2010-10-26 DIAGNOSIS — G2581 Restless legs syndrome: Secondary | ICD-10-CM

## 2010-10-26 DIAGNOSIS — Z0000 Encounter for general adult medical examination without abnormal findings: Secondary | ICD-10-CM

## 2010-10-26 DIAGNOSIS — E7800 Pure hypercholesterolemia, unspecified: Secondary | ICD-10-CM

## 2010-10-26 NOTE — Assessment & Plan Note (Signed)
Start red yeast rice. Work on exercise as tolerated and low chol diet.  Recheck in 6 months.

## 2010-10-26 NOTE — Progress Notes (Signed)
Subjective:    Patient ID: Susan [name], female    DOB: 1940-11-10, 70 y.o.   MRN: [id number]  HPI I have personally reviewed the Medicare Annual Wellness questionnaire and have noted 1. The patient's medical and social history 2. Their use of alcohol, tobacco or illicit drugs 3. Their current medications and supplements 4. The patient's functional ability including ADL's, fall risks, home safety risks and hearing or visual             impairment. 5. Diet and physical activities 6. Evidence for depression or mood disorders The patients weight, height, BMI and visual acuity have been recorded in the chart I have made referrals, counseling and provided education to the patient based review of the above and I have provided the pt with a written personalized care plan for preventive services.  Elevated Cholesterol:inadequate control, goal <130 On no medications.  Right arm and neck pain: Had MRI of neck ordered by Dr. Channing Mutters. 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 Had cortisone injections x 3 in neck... Has helped some.  Husband recently had a CVA currently in rehab.  She is having trouble sleeping. Using medication (requip 3 mg) for restless legs, this helps a lot.  She reports that she is not SOB at rest, but still some dyspnea on exertion. Has had nml lung function tests. He mother had pulmonary fibrosis. She is requesting a CXR today. She has a history of smoking, 40 pack years.       Review of Systems  Constitutional: Negative for fever and fatigue.  HENT: Negative for ear pain.   Eyes: Negative for pain.  Respiratory: Positive for shortness of breath. Negative for chest tightness.   Cardiovascular: Negative for chest pain, palpitations and leg swelling.  Gastrointestinal: Negative for abdominal pain.  Genitourinary: Negative for dysuria.       Objective:   Physical Exam  Constitutional: Vital signs are normal. She appears well-developed and well-nourished. She is  cooperative.  Non-toxic appearance. She does not appear ill. No distress.  HENT:  Head: Normocephalic.  Right Ear: Hearing, tympanic membrane, external ear and ear canal normal.  Left Ear: Hearing, tympanic membrane, external ear and ear canal normal.  Nose: Nose normal.  Eyes: Conjunctivae, EOM and lids are normal. Pupils are equal, round, and reactive to light. No foreign bodies found.  Neck: Trachea normal and normal range of motion. Neck supple. Carotid bruit is not present. No mass and no thyromegaly present.  Cardiovascular: Normal rate, regular rhythm, S1 normal, S2 normal, normal heart sounds and intact distal pulses.  Exam reveals no gallop.   No murmur heard. Pulmonary/Chest: Effort normal and breath sounds normal. No respiratory distress. She has no wheezes. She has no rhonchi. She has no rales.  Abdominal: Soft. Normal appearance and bowel sounds are normal. She exhibits no distension, no fluid wave, no abdominal bruit and no mass. There is no hepatosplenomegaly. There is no tenderness. There is no rebound, no guarding and no CVA tenderness. No hernia.  Genitourinary: No breast swelling, tenderness, discharge or bleeding. Pelvic exam was performed with patient prone.  Musculoskeletal:       Deformity B feet  Lymphadenopathy:    She has no cervical adenopathy.    She has no axillary adenopathy.  Neurological: She is alert. She has normal strength. No cranial nerve deficit or sensory deficit.  Skin: Skin is warm, dry and intact. No rash noted.  Psychiatric: Her speech is normal and behavior is normal. Judgment normal. Her  mood appears not anxious. Cognition and memory are normal. She does not exhibit a depressed mood.          Assessment & Plan:  Annual Medicare Wellness:  The patient's preventative maintenance and recommended screening tests for an annual wellness exam were reviewed in full today. Brought up to date unless services declined.  Counselled on the importance of  diet, exercise, and its role in overall health and mortality. The patient's FH and SH was reviewed, including their home life, tobacco status, and drug and alcohol status.   DEXA: last one in 2011.

## 2010-10-26 NOTE — Assessment & Plan Note (Signed)
HAs had nml pulmonary function tests. Will check CXR given smoking history. No chest [pain.

## 2010-10-26 NOTE — Patient Instructions (Addendum)
Red yeast rice 2400 mg divided daily. Continue fish oil. B12 1000 mcg daily Work on lower fat diet. Schedule mammogram in November

## 2010-10-26 NOTE — Telephone Encounter (Signed)
Message copied by Excell Seltzer on Tue Oct 26, 2010  5:16 PM ------      Message from: Gilmer Mor      Created: Tue Oct 26, 2010  5:02 PM       Patient advised as instructed via telephone.  She is willing to have ECHO done.

## 2010-10-26 NOTE — Assessment & Plan Note (Signed)
Stable on current medication.  

## 2010-11-09 ENCOUNTER — Other Ambulatory Visit: Payer: Medicare Other | Admitting: *Deleted

## 2010-11-11 ENCOUNTER — Other Ambulatory Visit: Payer: Self-pay | Admitting: Neurosurgery

## 2010-11-11 DIAGNOSIS — M47812 Spondylosis without myelopathy or radiculopathy, cervical region: Secondary | ICD-10-CM

## 2010-11-15 ENCOUNTER — Ambulatory Visit
Admission: RE | Admit: 2010-11-15 | Discharge: 2010-11-15 | Disposition: A | Discharge status: Home / Self Care | Payer: Medicare Other | Source: Ambulatory Visit | Attending: Neurosurgery | Admitting: Neurosurgery

## 2010-11-15 DIAGNOSIS — M47812 Spondylosis without myelopathy or radiculopathy, cervical region: Secondary | ICD-10-CM

## 2010-11-23 ENCOUNTER — Other Ambulatory Visit (INDEPENDENT_AMBULATORY_CARE_PROVIDER_SITE_OTHER): Payer: Medicare Other | Admitting: *Deleted

## 2010-11-23 DIAGNOSIS — R0600 Dyspnea, unspecified: Secondary | ICD-10-CM

## 2010-11-23 DIAGNOSIS — R0989 Other specified symptoms and signs involving the circulatory and respiratory systems: Secondary | ICD-10-CM

## 2010-11-23 DIAGNOSIS — I5189 Other ill-defined heart diseases: Secondary | ICD-10-CM

## 2010-12-13 ENCOUNTER — Ambulatory Visit: Payer: Medicare Other | Admitting: Cardiovascular Disease

## 2010-12-16 ENCOUNTER — Encounter: Payer: Self-pay | Admitting: Cardiovascular Disease

## 2010-12-21 ENCOUNTER — Ambulatory Visit: Payer: Medicare Other | Admitting: Cardiovascular Disease

## 2011-01-07 ENCOUNTER — Other Ambulatory Visit: Payer: Self-pay | Admitting: Family Medicine

## 2011-01-27 ENCOUNTER — Encounter: Payer: Self-pay | Admitting: Family Medicine

## 2011-01-27 ENCOUNTER — Ambulatory Visit (INDEPENDENT_AMBULATORY_CARE_PROVIDER_SITE_OTHER): Payer: Medicare Other | Admitting: Family Medicine

## 2011-01-27 VITALS — BP 120/72 | HR 81 | Temp 98.0°F | Ht 63.0 in | Wt 171.8 lb

## 2011-01-27 DIAGNOSIS — M5030 Other cervical disc degeneration, unspecified cervical region: Secondary | ICD-10-CM

## 2011-01-27 DIAGNOSIS — IMO0002 Reserved for concepts with insufficient information to code with codable children: Secondary | ICD-10-CM

## 2011-01-27 MED ORDER — ROPINIROLE HCL 3 MG PO TABS: 3.0000 mg | ORAL_TABLET | Freq: Every day | ORAL | Status: DC

## 2011-01-27 MED ORDER — CYCLOBENZAPRINE HCL 5 MG PO TABS: ORAL_TABLET | ORAL | Status: DC

## 2011-01-27 NOTE — Progress Notes (Signed)
  Subjective:    Patient ID: Susan [name], female    DOB: Aug 06, 1940, 70 y.o.   MRN: [id number]  HPI  70 year old female presents with neck pain and left trapezius. Pain when taking things of shelf.  In past she has had Ortho eval of shoulder with Dr. Lajoyce Corners.. Films etc. Saw Dr. Channing Mutters last week for arthritis in neck.. Surgery is not a great option, would cause restriction in her neck.  She comes to today for better pain control. Taking meloxicam 7.5 mg daily. Using tramadol for breakthru pain at night... 50 mg daily. No stomach irritation no past, GI bleeding or ulcers.   Review of Systems  Constitutional: Negative for fever and fatigue.  Respiratory: Negative for shortness of breath.   Cardiovascular: Negative for chest pain.  Gastrointestinal: Negative for nausea, abdominal pain, diarrhea and constipation.       Objective:   Physical Exam  Constitutional: She is oriented to person, place, and time. She appears well-developed and well-nourished.  Cardiovascular: Normal rate, regular rhythm and intact distal pulses.   Pulmonary/Chest: Effort normal and breath sounds normal.  Abdominal: Soft. Bowel sounds are normal.  Musculoskeletal:       ttp in left trapezius, no vertebral ttp. No pain with ROM of shoulder, neg impingement signs   Neurological: She is alert and oriented to person, place, and time. She has normal strength and normal reflexes. She displays normal reflexes. No cranial nerve deficit or sensory deficit.          Assessment & Plan:

## 2011-01-27 NOTE — Patient Instructions (Addendum)
Increase meloxicam to 15 mg daily. Continue to use tramadol in limited fashion, but can take twice a day if needed. Use muscle relaxant for trial at bedtime.. If not helping may stop. Call if any stomach irritation. Let me know if higher dose of meloxicam not helping.  Consider massage on left upper shoulder. Gentle stretching exercise, heat.

## 2011-01-27 NOTE — Assessment & Plan Note (Signed)
Causing left nack and upper shoulder pain, no evidence of radiculopathy.  No surgical options per Dr. Channing Mutters.  INCrease meloxicam, trial of muscle relaxant, limit tramadol but can use BID as long as no stomach irritation.  Follow up as needed.

## 2011-02-07 ENCOUNTER — Other Ambulatory Visit: Payer: Self-pay | Admitting: *Deleted

## 2011-02-07 NOTE — Telephone Encounter (Signed)
Phoned request from patient.  She needs this for neck and shoulder pain.

## 2011-02-08 MED ORDER — TRAMADOL HCL 50 MG PO TABS: 50.0000 mg | ORAL_TABLET | Freq: Every day | ORAL | Status: DC

## 2011-02-23 ENCOUNTER — Other Ambulatory Visit: Payer: Self-pay | Admitting: Family Medicine

## 2011-04-04 ENCOUNTER — Ambulatory Visit (INDEPENDENT_AMBULATORY_CARE_PROVIDER_SITE_OTHER): Payer: Medicare Other | Admitting: Family Medicine

## 2011-04-04 ENCOUNTER — Encounter: Payer: Self-pay | Admitting: Family Medicine

## 2011-04-04 DIAGNOSIS — E7800 Pure hypercholesterolemia, unspecified: Secondary | ICD-10-CM

## 2011-04-04 DIAGNOSIS — L659 Nonscarring hair loss, unspecified: Secondary | ICD-10-CM | POA: Insufficient documentation

## 2011-04-04 DIAGNOSIS — M79609 Pain in unspecified limb: Secondary | ICD-10-CM

## 2011-04-04 DIAGNOSIS — R0602 Shortness of breath: Secondary | ICD-10-CM

## 2011-04-04 DIAGNOSIS — M79603 Pain in arm, unspecified: Secondary | ICD-10-CM | POA: Insufficient documentation

## 2011-04-04 MED ORDER — LIDOCAINE 5 % EX PTCH: 1.0000 | MEDICATED_PATCH | Freq: Two times a day (BID) | CUTANEOUS | Status: DC

## 2011-04-04 MED ORDER — CELECOXIB 200 MG PO CAPS: 200.0000 mg | ORAL_CAPSULE | Freq: Every day | ORAL | Status: DC

## 2011-04-04 NOTE — Assessment & Plan Note (Signed)
Does not correspond to shoulder or elbow, nml neck exam.  More muscular in nature, she does work as Interior and spatial designer long hours. Eval TSH, consider further eval if not improving with time/celebrex trial etc.

## 2011-04-04 NOTE — Patient Instructions (Addendum)
Stop by front desk to make referral AGAIN (missed first) to Dr. Mariah Milling for dyspnea, diastolic dysfunction, as well as cardiac clearance for surgery. Stop advil. Trial of celebrex.. Limit as much as possible. Use lidoderm patches for foot pain as well. We will call back with lab result. Schedule AMW in next 6 months with fasting labs prior. Call sooner if not feeling better.

## 2011-04-04 NOTE — Assessment & Plan Note (Signed)
Unclear cause.Marland Kitchen Possibly due to stress from husband;s health issues.  Will reeval TSH. May be androgenic hair loss as well although pt denies family history of this.

## 2011-04-04 NOTE — Assessment & Plan Note (Signed)
May simply due to deconditioning, but given new finding of diastolic dysfunction and upcoming surgery... Will have her see cardiology for clearance prior to surgeries. Pt would feel most comfortable with this plan.

## 2011-04-04 NOTE — Assessment & Plan Note (Signed)
Severe. Discussed options of celebrex vs vicodin. Discussed SE. CV and GI SE of celebrex. Of not pt already over using ibuprofen which has similar risks.  She has chosen to try trial of celebrex, will stop other NSAIDs. If no improvement or not covered, will manage with vicodin until surgery.

## 2011-04-04 NOTE — Progress Notes (Signed)
Subjective:    Patient ID: Susan [name], female    DOB: 1940/08/31, 70 y.o.   MRN: [id number]  HPI  70 year old female presents for multiple medical issues as well as surgical clearance.  Has upcoming hip surgery.. On right in 05/2011. Also has appt for foot surgery (has chronic foot issues) in [place]... 06/2011.  She needs surgical clearance for these surgeries. No past history of MI, CVA. She feels dyspnea has improved, eval in 10/2010 showed nml  lung function tests. He mother had pulmonary fibrosis.  She has a history of smoking, 40 pack years.  CXR showed borderline cardiomegaly, no lesions. EKG: NSR. ECHO showed:  Diastolic dysfunction.. Recommended referral to Dr. Mariah Milling. She missed the appointment. BP well controlled.  She has noted hair loss in past few months, thinning in general. She is under stress from... Husbands health. No clear depression, good energy.  Upper arms achy, chest wall achy to palpation. No known injury.   Tramadol does not help with pain, so stopped., meloxicam, diclofenac no help. Needs pain med for hip and foot pain.  Already taking 6-8 advil a day.       Review of Systems  Constitutional: Negative for fever and fatigue.  HENT: Negative for ear pain.   Eyes: Negative for pain.  Respiratory: Positive for shortness of breath. Negative for cough and chest tightness.   Cardiovascular: Negative for chest pain, palpitations and leg swelling.  Gastrointestinal: Negative for abdominal pain and blood in stool.  Genitourinary: Negative for dysuria.  Musculoskeletal: Positive for myalgias. Negative for back pain, joint swelling and gait problem.  Skin: Negative for rash.       Objective:   Physical Exam  Constitutional: Vital signs are normal. She appears well-developed and well-nourished. She is cooperative.  Non-toxic appearance. She does not appear ill. No distress.  HENT:  Head: Normocephalic.  Right Ear: Hearing, tympanic membrane,  external ear and ear canal normal. Tympanic membrane is not erythematous, not retracted and not bulging.  Left Ear: Hearing, tympanic membrane, external ear and ear canal normal. Tympanic membrane is not erythematous, not retracted and not bulging.  Nose: No mucosal edema or rhinorrhea. Right sinus exhibits no maxillary sinus tenderness and no frontal sinus tenderness. Left sinus exhibits no maxillary sinus tenderness and no frontal sinus tenderness.  Mouth/Throat: Uvula is midline, oropharynx is clear and moist and mucous membranes are normal.  Eyes: Conjunctivae, EOM and lids are normal. Pupils are equal, round, and reactive to light. No foreign bodies found.  Neck: Trachea normal and normal range of motion. Neck supple. Carotid bruit is not present. No mass and no thyromegaly present.  Cardiovascular: Normal rate, regular rhythm, S1 normal, S2 normal, normal heart sounds, intact distal pulses and normal pulses.  Exam reveals no gallop and no friction rub.   No murmur heard. Pulmonary/Chest: Effort normal and breath sounds normal. Not tachypneic. No respiratory distress. She has no decreased breath sounds. She has no wheezes. She has no rhonchi. She has no rales.  Abdominal: Soft. Normal appearance and bowel sounds are normal. There is no tenderness.  Musculoskeletal:       Deformity of feet TTP in B upper arm diffusely, ttp over anterior chest wall.  Full ROM, neck, B shoulders, elbows, no deformity in these joints. Neg Spurling B, neg impingement sign B.  Neurological: She is alert.  Skin: Skin is warm, dry and intact. No rash noted.  Psychiatric: She has a normal mood and affect. Her speech is  normal and behavior is normal. Judgment and thought content normal. Her mood appears not anxious. Cognition and memory are normal. She does not exhibit a depressed mood.          Assessment & Plan:

## 2011-04-05 ENCOUNTER — Telehealth: Payer: Self-pay | Admitting: *Deleted

## 2011-04-05 LAB — COMPREHENSIVE METABOLIC PANEL
CO2: 26 mEq/L (ref 19–32)
Creatinine, Ser: 0.6 mg/dL (ref 0.4–1.2)
GFR: 116.32 mL/min (ref >60.00)
Glucose, Bld: 91 mg/dL (ref 70–99)
Total Bilirubin: 0.8 mg/dL (ref 0.3–1.2)

## 2011-04-05 LAB — LIPID PANEL
Cholesterol: 188 mg/dL (ref 0–200)
HDL: 46.8 mg/dL (ref >39.00)
Triglycerides: 159 mg/dL — ABNORMAL HIGH (ref 0.0–149.0)
VLDL: 31.8 mg/dL (ref 0.0–40.0)

## 2011-04-05 NOTE — Telephone Encounter (Signed)
Prior Berkley Harvey is needed for celebrex, form is on your desk.

## 2011-04-05 NOTE — Telephone Encounter (Signed)
Will complete on return to office

## 2011-04-06 ENCOUNTER — Encounter: Payer: Self-pay | Admitting: *Deleted

## 2011-04-08 NOTE — Telephone Encounter (Signed)
Will complete 

## 2011-04-08 NOTE — Telephone Encounter (Signed)
Prior auth form faxed back to [place] and approval given.  Advised pharmacy.  Approval letter placed on doctor's desk for signature and scanning.

## 2011-04-21 ENCOUNTER — Ambulatory Visit (INDEPENDENT_AMBULATORY_CARE_PROVIDER_SITE_OTHER): Payer: Medicare Other | Admitting: Cardiovascular Disease

## 2011-04-21 ENCOUNTER — Encounter: Payer: Self-pay | Admitting: Cardiovascular Disease

## 2011-04-21 VITALS — BP 130/80 | HR 100 | Ht 63.0 in | Wt 170.2 lb

## 2011-04-21 DIAGNOSIS — I519 Heart disease, unspecified: Secondary | ICD-10-CM

## 2011-04-21 DIAGNOSIS — R0602 Shortness of breath: Secondary | ICD-10-CM

## 2011-04-21 DIAGNOSIS — Z01810 Encounter for preprocedural cardiovascular examination: Secondary | ICD-10-CM | POA: Insufficient documentation

## 2011-04-21 DIAGNOSIS — R000 Tachycardia, unspecified: Secondary | ICD-10-CM | POA: Insufficient documentation

## 2011-04-21 DIAGNOSIS — I5189 Other ill-defined heart diseases: Secondary | ICD-10-CM | POA: Insufficient documentation

## 2011-04-21 MED ORDER — METOPROLOL SUCCINATE ER 25 MG PO TB24: 25.0000 mg | ORAL_TABLET | Freq: Every day | ORAL | Status: DC

## 2011-04-21 NOTE — Assessment & Plan Note (Signed)
Heart rate noted to be elevated today as well as in previous visits. We'll start metoprolol succinate 25 mg daily and have suggested she continue this indefinitely.

## 2011-04-21 NOTE — Assessment & Plan Note (Signed)
Finding of diastolic dysfunction on her echocardiogram, likely secondary to underlying LVH. We will start low-dose beta blocker.

## 2011-04-21 NOTE — Assessment & Plan Note (Signed)
She would be acceptable risk for upcoming hip replacement surgery in early January 2013 in [place].

## 2011-04-21 NOTE — Assessment & Plan Note (Signed)
Etiology of her shortness of breath is likely multifactorial including obesity, LVH, diastolic dysfunction, history of smoking and tachycardia. We will start metoprolol for tachycardia.

## 2011-04-21 NOTE — Progress Notes (Signed)
Patient ID: Susan [name], female    DOB: 10-17-1940, 70 y.o.   MRN: [id number]  HPI Comments: Susan [name] is a very pleasant 70 year old man with a history of smoking, possible COPD, status post left hip replacement who is scheduled for right hip replacement in early January and presents for preoperative evaluation.  Overall she reports that she feels well. She has no complaints of shortness of breath At rest though does have mild shortness of breath with heavy exertion. No chest pain or edema. No lightheadedness or dizziness. She is active and is able to exert herself without stopping. She is concerned about her husband who had a stroke. She believes that he is very depressed and she has been taking care of him.  She does report that her heart rate is typically elevated  Recent echocardiogram shows moderate concentric LVH, normal LV systolic function, diastolic dysfunction, borderline to mild pulmonary hypertension.  EKG shows sinus tachycardia with rate 100 beats per minute with rare PVC, no significant ST or T wave changes      Outpatient Encounter Prescriptions as of 04/21/2011  Medication Sig Dispense Refill  . acetaminophen (TYLENOL) 500 MG tablet Take 500 mg by mouth every 6 (six) hours as needed.        Marland Kitchen acyclovir (ZOVIRAX) 400 MG tablet Take 1 tablet (400 mg total) by mouth 2 (two) times daily.  60 tablet  3  . ibuprofen (ADVIL,MOTRIN) 200 MG tablet Take 200 mg by mouth every 6 (six) hours as needed.        . lidocaine (LIDODERM) 5 % Place 1 patch onto the skin every 12 (twelve) hours. Remove & Discard patch within 12 hours or as directed by MD  90 patch  3  . rOPINIRole (REQUIP) 3 MG tablet Take 1 tablet (3 mg total) by mouth at bedtime.  30 tablet  11    Review of Systems  Constitutional: Negative.   HENT: Negative.   Eyes: Negative.   Respiratory: Positive for shortness of breath.   Cardiovascular: Negative.   Gastrointestinal: Negative.   Musculoskeletal: Negative.     Skin: Negative.   Neurological: Negative.   Hematological: Negative.   Psychiatric/Behavioral: Negative.   All other systems reviewed and are negative.    BP 130/80  Pulse 100  Ht 5\' 3"  (1.6 m)  Wt 170 lb 4 oz (77.225 kg)  BMI 30.16 kg/m2  Physical Exam  Nursing note and vitals reviewed. Constitutional: She is oriented to person, place, and time. She appears well-developed and well-nourished.  HENT:  Head: Normocephalic.  Nose: Nose normal.  Mouth/Throat: Oropharynx is clear and moist.  Eyes: Conjunctivae are normal. Pupils are equal, round, and reactive to light.  Neck: Normal range of motion. Neck supple. No JVD present.  Cardiovascular: Normal rate, regular rhythm, S1 normal, S2 normal, normal heart sounds and intact distal pulses.  Exam reveals no gallop and no friction rub.   No murmur heard. Pulmonary/Chest: Effort normal and breath sounds normal. No respiratory distress. She has no wheezes. She has no rales. She exhibits no tenderness.  Abdominal: Soft. Bowel sounds are normal. She exhibits no distension. There is no tenderness.  Musculoskeletal: Normal range of motion. She exhibits no edema and no tenderness.  Lymphadenopathy:    She has no cervical adenopathy.  Neurological: She is alert and oriented to person, place, and time. Coordination normal.  Skin: Skin is warm and dry. No rash noted. No erythema.  Psychiatric: She has a normal mood and  affect. Her behavior is normal. Judgment and thought content normal.         Assessment and Plan

## 2011-04-21 NOTE — Patient Instructions (Signed)
You are doing well.  Please start the metoprolol one a day for fast heart rate  Please call us if you have new issues that need to be addressed before your next appt.  The office will contact you for a follow up Appt. In 12 months

## 2011-04-25 ENCOUNTER — Other Ambulatory Visit: Payer: Self-pay | Admitting: Family Medicine

## 2011-05-06 ENCOUNTER — Telehealth: Payer: Self-pay | Admitting: *Deleted

## 2011-05-06 NOTE — Telephone Encounter (Signed)
Pt called stating since taking metoprolol succinate 25mg  at night she has felt very fatigued during daytime. Pt has not been tracking BP/HR. HR last visit was 100. We are trying to decr hr prior to hip sx sched 05/26/11. I advised pt 1) to start monitoring HR 2) gave option to take med a different time of day, and if fatigue continues try 1/2 tab but we need to know HR numbers. She will call back next week.

## 2011-05-11 ENCOUNTER — Other Ambulatory Visit: Payer: Self-pay | Admitting: Family Medicine

## 2011-05-12 NOTE — Telephone Encounter (Addendum)
Please call pt.. Is she still using tramadol.. She told me this was not helping at last OV.   I went ahead and sent in but make sure she is not taking multiple meds.

## 2011-05-16 ENCOUNTER — Other Ambulatory Visit: Payer: Self-pay | Admitting: Family Medicine

## 2011-05-23 ENCOUNTER — Telehealth: Payer: Self-pay | Admitting: *Deleted

## 2011-05-23 NOTE — Telephone Encounter (Signed)
Pt has stated that she cannot take metoprolol that was prescribed at [facility] 11/29 due to severe fatigue. She was taking metop succ 25mg  daily. Per last note, I asked her to cut in half and try and also monitor BP/HR and give me numbers. She states she did not do this, and has been off of metop since we last spoke, she also does not know what her HR is since stopping. She was given metop to decr HR prior to sx scheduled 05/26/11. She says at her pre-op appt HR was 77. She denies any c/o at this time. Told pt I will notify Dr. Mariah Milling of her side effects and that she has stopped taking.

## 2011-05-25 ENCOUNTER — Telehealth: Payer: Self-pay | Admitting: Internal Medicine

## 2011-05-25 NOTE — Telephone Encounter (Signed)
Dr. Patsy Lager took care of this

## 2011-05-25 NOTE — Telephone Encounter (Signed)
Patient called and stated she needs medical clearance for a right hip replacement by Friday.  She is going to send the papers to fill out.

## 2011-05-26 ENCOUNTER — Telehealth: Payer: Self-pay | Admitting: Cardiovascular Disease

## 2011-05-26 NOTE — Telephone Encounter (Signed)
Patient called and stated she dropped off surgical clearance letter yesterday.  She needs to pick up the letter today, please call her at work [phone].

## 2011-05-26 NOTE — Telephone Encounter (Signed)
Clearance letter was faxed this AM. Pt notified, she said that is fine, she does not need original if already sent.

## 2011-06-27 ENCOUNTER — Other Ambulatory Visit: Payer: Self-pay | Admitting: Family Medicine

## 2011-08-08 ENCOUNTER — Other Ambulatory Visit: Payer: Self-pay | Admitting: Family Medicine

## 2011-08-08 NOTE — Telephone Encounter (Signed)
Last seen 04-04-2011 last refill 06-27-2011

## 2011-08-08 NOTE — Telephone Encounter (Signed)
Received faxed refill request from pharmacy for Meloxicam 7.5 mg, take one by mouth every day. This is not on medication list. The refill sheet shows that it was originally filled by Dr. Trey Sailors, last refill 06/27/11 #30. Is it okay to refill medication?

## 2011-08-10 ENCOUNTER — Other Ambulatory Visit: Payer: Self-pay | Admitting: *Deleted

## 2011-08-10 NOTE — Telephone Encounter (Signed)
Patient requesting refill on meloxicam 7.5 mg but this medication not on medication list is this okay?

## 2011-08-10 NOTE — Telephone Encounter (Signed)
No.. Should not take meloxicam and tramadol together, can cause stomach irritation, ulcers, bleeding etc.

## 2011-08-11 NOTE — Telephone Encounter (Signed)
Pharmacy advised  

## 2011-08-22 ENCOUNTER — Encounter: Payer: Self-pay | Admitting: Family Medicine

## 2011-08-22 ENCOUNTER — Ambulatory Visit (INDEPENDENT_AMBULATORY_CARE_PROVIDER_SITE_OTHER): Payer: Medicare Other | Admitting: Family Medicine

## 2011-08-22 VITALS — BP 130/70 | HR 104 | Temp 98.5°F | Ht 63.0 in | Wt 168.8 lb

## 2011-08-22 DIAGNOSIS — R109 Unspecified abdominal pain: Secondary | ICD-10-CM

## 2011-08-22 LAB — POCT URINALYSIS DIPSTICK
Bilirubin, UA: NEGATIVE
Glucose, UA: NEGATIVE
Ketones, UA: NEGATIVE

## 2011-08-22 NOTE — Progress Notes (Signed)
Patient Name: Susan [name] Date of Birth: [birth year] Age: 71 y.o. Medical Record Number: [id number] Gender: female Date of Encounter: 08/22/2011  History of Present Illness:  Susan [name] is a 71 y.o. very pleasant female patient who presents with the following:  Patient presents with "problems with intestines", but on questioning, has no N/V/D. No abdominal pain at baseline. No frequency or dysuria. No gross blood.  UA at Orthopedics office was c/w UTI and Ceftin was called in, which she did not fill.  Has a L revision hip arthroplasty scheduled for Friday.  Last week Dr. Doristine Counter - went last week for preop and it looked as  [facility]  No dysuria, no urgency.   Patient Active Problem List  Diagnoses  . HERPES SIMPLEX INFECTION, RECURRENT  . HYPERCHOLESTEROLEMIA  . RESTLESS LEG SYNDROME  . ALLERGIC RHINITIS  . VENTRAL HERNIA  . DIVERTICULOSIS, COLON  . ANKLE PAIN, BILATERAL  . FOOT PAIN, BILATERAL  . DYSPNEA  . HIP REPLACEMENT, LEFT, HX OF  . DDD (degenerative disc disease)  . Alopecia  . Arm pain  . Diastolic dysfunction  . Tachycardia  . SOB (shortness of breath)  . Preoperative cardiovascular examination   Past Medical History  Diagnosis Date  . Diverticulosis of colon (without mention of hemorrhage)   . 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 Acute peptic ulcer, unspecified site, with hemorrhage and perforation, without mention of obstruction   . Chronic airway obstruction, not elsewhere classified   . Ventral hernia   . Hematochezia    Past Surgical History  Procedure Date  . Bunionectomy   . Total hip arthroplasty 2007    left  . Appendectomy   . Tubal ligation   . Cosmetic surgery 1970's    abdomen. Tummy Tuck   History  Substance Use Topics  . Smoking status: Former Smoker -- 40 years    Types: Cigarettes    Quit date: 08/29/2000  . Smokeless tobacco: Not on file  . Alcohol Use: Yes     2-3 glasses of wine per  night   Family History  Problem Relation Age of Onset  . Diabetes Mother   . Coronary artery disease Mother   . Lung disease Mother   . Heart disease Mother     CAD  . Coronary artery disease Father   . Kidney disease Father   . Diabetes Father   . Heart disease Father     CAD  . Cancer Neg Hx    No Known Allergies Current Outpatient Prescriptions on File Prior to Visit  Medication Sig Dispense Refill  . acetaminophen (TYLENOL) 500 MG tablet Take 500 mg by mouth every 6 (six) hours as needed.        Marland Kitchen acyclovir (ZOVIRAX) 400 MG tablet Take 1 tablet (400 mg total) by mouth 2 (two) times daily.  60 tablet  3  . cyclobenzaprine (FLEXERIL) 5 MG tablet TAKE 1 TO 2 TABLETS AT BEDTIME AS NEEDEDFOR MUSCLE SPASM  30 tablet  0  . fluticasone (FLONASE) 50 MCG/ACT nasal spray USE 2 SPRAYS IN EACH NOSTRIL            EVERY DAY  16 g  3  . ibuprofen (ADVIL,MOTRIN) 200 MG tablet Take 200 mg by mouth every 6 (six) hours as needed.        . lidocaine (LIDODERM) 5 % Place 1 patch onto the skin every 12 (twelve) hours. Remove & Discard patch within 12 hours or  as directed by MD  90 patch  3  . rOPINIRole (REQUIP) 3 MG tablet Take 1 tablet (3 mg total) by mouth at bedtime.  30 tablet  11  . traMADol (ULTRAM) 50 MG tablet TAKE 1 TABLET EVERY DAY  30 tablet  0     Past Medical History, Surgical History, Social History, Family History, Problem List, Medications, and Allergies have been reviewed and updated if relevant.  Review of Systems:  ROS: GEN: Acute illness details above GI: Tolerating PO intake GU: maintaining adequate hydration and urination Pulm: No SOB Interactive and getting along well at home.  Otherwise, ROS is as per the HPI.   PE  Filed Vitals:   08/22/11 1211  BP: 130/70  Pulse: 104  Temp: 98.5 F (36.9 C)  TempSrc: Oral  Height: 5\' 3"  (1.6 m)  Weight: 168 lb 12.8 oz (76.567 kg)  SpO2: 98%     GEN: WDWN, A&Ox4,NAD. Non-toxic HEENT: Atraumatc, normocephalic. CV: RRR,  No M/G/R PULM: CTA B, No wheezes, crackles, or rhonchi ABD: S, mild hypogastric and LLQ abd pain, ND, +BS, no rebound. No CVAT.EXT: No c/c/e   Body mass index is 29.90 kg/(m^2).   Assessment and Plan:  1. Abdominal  pain, other specified site  Urine culture, POCT Urinalysis Dipstick, POCT UA - Microscopic Only, POCT Urinalysis Dipstick   prob UTI, check culture. On ceftin.  She also asks about blood levels of metal from prior arthroplasty per her report, which I recommended that she discuss with the operating surgeon for his recommendations.  Urinalysis    Component Value Date/Time   LABSPEC 1.015 03/25/2009 1510   PHURINE 5.0 03/25/2009 1510   HGBUR small 03/25/2009 1510   BILIRUBINUR neg 08/22/2011 1307   BILIRUBINUR negative 03/25/2009 1510   UROBILINOGEN 0.2 08/22/2011 1307   UROBILINOGEN 0.2 03/25/2009 1510   NITRITE positive 08/22/2011 1307   NITRITE positive 03/25/2009 1510   LEUKOCYTESUR Trace 08/22/2011 1307       Orders Today: Orders Placed This Encounter  Procedures  . Urine culture  . POCT Urinalysis Dipstick    Medications Today: No orders of the defined types were placed in this encounter.

## 2011-08-24 LAB — URINE CULTURE

## 2011-08-30 ENCOUNTER — Other Ambulatory Visit: Payer: Self-pay | Admitting: Family Medicine

## 2011-09-14 ENCOUNTER — Telehealth: Payer: Self-pay | Admitting: Family Medicine

## 2011-09-14 DIAGNOSIS — E7800 Pure hypercholesterolemia, unspecified: Secondary | ICD-10-CM

## 2011-09-14 NOTE — Telephone Encounter (Signed)
Message copied by Excell Seltzer on Wed Sep 14, 2011 10:16 PM ------      Message from: [name]      Created: Wed Sep 14, 2011  4:49 PM       Patient is scheduled for CPX labs, please order future labs, Thanks , Terri        Pt has a couple of tests ordered from December

## 2011-09-19 ENCOUNTER — Other Ambulatory Visit (INDEPENDENT_AMBULATORY_CARE_PROVIDER_SITE_OTHER): Payer: Medicare Other

## 2011-09-19 DIAGNOSIS — E7800 Pure hypercholesterolemia, unspecified: Secondary | ICD-10-CM

## 2011-09-19 LAB — COMPREHENSIVE METABOLIC PANEL
Albumin: 3.5 g/dL (ref 3.5–5.2)
BUN: 13 mg/dL (ref 6–23)
CO2: 26 mEq/L (ref 19–32)
Calcium: 8.6 mg/dL (ref 8.4–10.5)
Chloride: 104 mEq/L (ref 96–112)
Creatinine, Ser: 0.6 mg/dL (ref 0.4–1.2)
GFR: 113.78 mL/min (ref >60.00)
Glucose, Bld: 92 mg/dL (ref 70–99)
Potassium: 4.2 mEq/L (ref 3.5–5.1)

## 2011-09-19 LAB — LIPID PANEL
Cholesterol: 154 mg/dL (ref 0–200)
HDL: 47.4 mg/dL (ref >39.00)
Total CHOL/HDL Ratio: 3
Triglycerides: 86 mg/dL (ref 0.0–149.0)

## 2011-09-26 ENCOUNTER — Encounter: Payer: Medicare Other | Admitting: Family Medicine

## 2011-10-04 ENCOUNTER — Encounter: Payer: Medicare Other | Admitting: Family Medicine

## 2011-10-12 ENCOUNTER — Ambulatory Visit (INDEPENDENT_AMBULATORY_CARE_PROVIDER_SITE_OTHER): Payer: Medicare Other | Admitting: Family Medicine

## 2011-10-12 ENCOUNTER — Encounter: Payer: Self-pay | Admitting: Family Medicine

## 2011-10-12 VITALS — BP 150/98 | HR 96 | Temp 98.0°F | Wt 170.2 lb

## 2011-10-12 DIAGNOSIS — N3289 Other specified disorders of bladder: Secondary | ICD-10-CM

## 2011-10-12 DIAGNOSIS — R3989 Other symptoms and signs involving the genitourinary system: Secondary | ICD-10-CM

## 2011-10-12 DIAGNOSIS — R102 Pelvic and perineal pain: Secondary | ICD-10-CM

## 2011-10-12 DIAGNOSIS — R109 Unspecified abdominal pain: Secondary | ICD-10-CM

## 2011-10-12 LAB — POCT URINALYSIS DIPSTICK
Bilirubin, UA: NEGATIVE
Glucose, UA: NEGATIVE
Ketones, UA: NEGATIVE
Nitrite, UA: NEGATIVE

## 2011-10-12 MED ORDER — FLUTICASONE PROPIONATE 50 MCG/ACT NA SUSP: 2.0000 | Freq: Every day | NASAL | Status: DC

## 2011-10-12 NOTE — Patient Instructions (Addendum)
I've refilled flonase. I'm not sure if there is infection currently but I have sent off urine culture to verify - could be infection.  If positive, we will wait on sensitivity results to treat. If worsening, please let us know. In meantime, push fluids and plenty of rest - may use tylenol for bladder discomfort. Keep appointment next week with Dr. Ermalene Searing.

## 2011-10-12 NOTE — Assessment & Plan Note (Signed)
UA with moderate blood and trace LE, neg nitrites.  Micro not suspicious for infection. However, complicated by currently being on 6 wk course of keflex for presumed hardware infection or prophylaxis, which could be obfuscating urinalysis. Will send off urine culture and call pt with results.. Advised if sxs worsening to call us for course of cipro.  In meantime, await culture. Pt agrees with plan. ?crystals on micro, denies h/o kidney stones, denies any pain. Has f/u appt with PCP on Wednesday.

## 2011-10-12 NOTE — Progress Notes (Signed)
  Subjective:    Patient ID: Susan [name], female    DOB: 1940/09/26, 71 y.o.   MRN: [id number]  HPI CC: "i think i have a urinary infection"  Seen here mid April with abd pain and concern for UTI, UCx grew >100k - resistant to several cephalosporins, sensitive to cipro.  Initially on ceftin but that was changed to cipro by ortho.  Recently with hip replacements s/p revision, latest on left side 09/01/2011.  States on 6 wk course of abx by Dr. Autumn Messing (ortho).  Doesn't know what type of abx currently on - Kim called [facility] and verified keflex 500mg  qid.  Now endorsing 1 wk h/o suprapubic pressure with some frequency.  Denise dysuria, urgency, frequency.  Denies fevers/chills, nausea/vomiting, flank or back pain, hematuria.  bp elevated today - rushing to get here  Review of Systems Per HPI    Objective:   Physical Exam  Nursing note and vitals reviewed. Constitutional: She appears well-developed and well-nourished. No distress.  HENT:  Head: Normocephalic and atraumatic.  Mouth/Throat: Oropharynx is clear and moist. No oropharyngeal exudate.  Abdominal: Soft. Bowel sounds are normal. She exhibits no distension and no mass. There is no hepatosplenomegaly. There is no tenderness. There is no rebound, no guarding and no CVA tenderness.       Suprapubic pressure to palpation       Assessment & Plan:

## 2011-10-13 LAB — URINE CULTURE: Colony Count: NO GROWTH

## 2011-11-08 ENCOUNTER — Ambulatory Visit: Payer: Medicare Other | Admitting: Family Medicine

## 2011-12-12 ENCOUNTER — Ambulatory Visit (INDEPENDENT_AMBULATORY_CARE_PROVIDER_SITE_OTHER): Payer: Medicare Other | Admitting: Family Medicine

## 2011-12-12 ENCOUNTER — Encounter: Payer: Self-pay | Admitting: Family Medicine

## 2011-12-12 VITALS — BP 130/80 | HR 88 | Temp 98.3°F | Ht 63.0 in | Wt 171.0 lb

## 2011-12-12 DIAGNOSIS — J019 Acute sinusitis, unspecified: Secondary | ICD-10-CM | POA: Insufficient documentation

## 2011-12-12 MED ORDER — FLUTICASONE PROPIONATE 50 MCG/ACT NA SUSP: 2.0000 | Freq: Every day | NASAL | Status: DC

## 2011-12-12 MED ORDER — ROPINIROLE HCL 3 MG PO TABS: 3.0000 mg | ORAL_TABLET | Freq: Every day | ORAL | Status: DC

## 2011-12-12 MED ORDER — AMOXICILLIN-POT CLAVULANATE 875-125 MG PO TABS: 1.0000 | ORAL_TABLET | Freq: Two times a day (BID) | ORAL | Status: AC

## 2011-12-12 NOTE — Progress Notes (Signed)
  Subjective:    Patient ID: Susan [name], female    DOB: 1940-12-26, 71 y.o.   MRN: [id number]  HPI CC: "i have a sinus infection"  1wk h/o sinus sxs.  Blowing out purulent yellow/green sputum.  Does feel head congestion.  Feels generalized malaise as well.  Also bad taste when eating food.  Tried mucinex which helps loosen mucous.  Also has flonase but forgets to use.  No fevers/chills, headaches, chest pain/tightness, abd pain, n/v, coughing.  No ear or tooth pain.  No rhinorrhea.  No PNDrainage  Pending surgery on left ankle 12/21/2011.  Takes 2 BC powder bid. Had bilat hip replacement this year. H/o urinary incontinence.  Past Medical History  Diagnosis Date  . Diverticulosis of colon (without mention of hemorrhage)   . 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 Acute peptic ulcer, unspecified site, with hemorrhage and perforation, without mention of obstruction   . Chronic airway obstruction, not elsewhere classified   . Ventral hernia   . Hematochezia      Review of Systems Per HPI    Objective:   Physical Exam  Nursing note and vitals reviewed. Constitutional: She appears well-developed and well-nourished. No distress.  HENT:  Head: Normocephalic and atraumatic.  Right Ear: Hearing, tympanic membrane, external ear and ear canal normal.  Left Ear: Hearing, tympanic membrane, external ear and ear canal normal.  Nose: Mucosal edema present. No rhinorrhea. Right sinus exhibits no maxillary sinus tenderness and no frontal sinus tenderness. Left sinus exhibits no maxillary sinus tenderness and no frontal sinus tenderness.  Mouth/Throat: Uvula is midline, oropharynx is clear and moist and mucous membranes are normal. No oropharyngeal exudate, posterior oropharyngeal edema, posterior oropharyngeal erythema or tonsillar abscesses.  Eyes: Conjunctivae and EOM are normal. Pupils are equal, round, and reactive to light. No scleral icterus.  Neck: Normal range of motion.  Neck supple.  Cardiovascular: Normal rate, regular rhythm, normal heart sounds and intact distal pulses.   No murmur heard. Pulmonary/Chest: Effort normal and breath sounds normal. No respiratory distress. She has no wheezes. She has no rales.  Musculoskeletal: She exhibits no edema.  Lymphadenopathy:    She has no cervical adenopathy.  Skin: Skin is warm and dry. No rash noted.      Assessment & Plan:  Requests refill of meds.

## 2011-12-12 NOTE — Assessment & Plan Note (Signed)
Anticipate early acute sinusitis as 1 wk into illness. Will treat aggressively with abx given upcoming ankle surgery. See pt instructions for details. To update [place] if not improved as expected.

## 2011-12-12 NOTE — Patient Instructions (Addendum)
I've sent in flonase to your pharmacy. You have a sinus infection. Take medicine as prescribed: augmentin twice daily with food for 10 days. Push fluids and plenty of rest. Nasal saline irrigation or neti pot to help drain sinuses. May use simple mucinex with plenty of fluid to help mobilize mucous. Let us know if fever >101.5, trouble opening/closing mouth, difficulty swallowing, or worsening - you may need to be seen again.

## 2012-01-17 ENCOUNTER — Encounter: Payer: Self-pay | Admitting: Family Medicine

## 2012-01-17 ENCOUNTER — Ambulatory Visit (INDEPENDENT_AMBULATORY_CARE_PROVIDER_SITE_OTHER): Payer: Medicare Other | Admitting: Family Medicine

## 2012-01-17 VITALS — BP 156/78 | HR 72 | Temp 97.6°F

## 2012-01-17 DIAGNOSIS — Z1231 Encounter for screening mammogram for malignant neoplasm of breast: Secondary | ICD-10-CM

## 2012-01-17 DIAGNOSIS — G4700 Insomnia, unspecified: Secondary | ICD-10-CM

## 2012-01-17 DIAGNOSIS — R350 Frequency of micturition: Secondary | ICD-10-CM

## 2012-01-17 DIAGNOSIS — E7800 Pure hypercholesterolemia, unspecified: Secondary | ICD-10-CM

## 2012-01-17 DIAGNOSIS — Z780 Asymptomatic menopausal state: Secondary | ICD-10-CM

## 2012-01-17 DIAGNOSIS — Z0000 Encounter for general adult medical examination without abnormal findings: Secondary | ICD-10-CM

## 2012-01-17 DIAGNOSIS — N3941 Urge incontinence: Secondary | ICD-10-CM

## 2012-01-17 LAB — POCT URINALYSIS DIPSTICK
Bilirubin, UA: NEGATIVE
Glucose, UA: NEGATIVE
Ketones, UA: NEGATIVE
Spec Grav, UA: >=1.03

## 2012-01-17 MED ORDER — TRAZODONE HCL 50 MG PO TABS: 25.0000 mg | ORAL_TABLET | Freq: Every evening | ORAL | Status: DC | PRN

## 2012-01-17 NOTE — Assessment & Plan Note (Signed)
Well controlled on no med. 

## 2012-01-17 NOTE — Progress Notes (Signed)
Subjective:    Patient ID: Susan [name], female    DOB: 1940-06-09, 71 y.o.   MRN: [id number]  HPI  I have personally reviewed the Medicare Annual Wellness questionnaire and have noted 1. The patient's medical and social history 2. Their use of alcohol, tobacco or illicit drugs 3. Their current medications and supplements 4. The patient's functional ability including ADL's, fall risks, home safety risks and hearing or visual             impairment. 5. Diet and physical activities 6. Evidence for depression or mood disorders The patients weight, height, BMI and visual acuity have been recorded in the chart I have made referrals, counseling and provided education to the patient based review of the above and I have provided the pt with a written personalized care plan for preventive services.  Elevated Cholesterol: Well controlled at last check. Lab Results  Component Value Date   CHOL 154 09/19/2011   HDL 47.40 09/19/2011   LDLCALC 89 09/19/2011   LDLDIRECT 146.6 10/20/2010   TRIG 86.0 09/19/2011   CHOLHDL 3 09/19/2011  Diet compliance: Exercise: Other complaints:  Has had B hip revisions surgery 05/2011, 4/ 2013  (Dr. Autumn Messing) and left foot surgery in last 6 months.  Going to get cast off foot, surgery (Dr. Dareen Piano) 11/2011  She has been having continuous urge incontinence and frequency. No dysuria. Urine culture no growth in 09/2011. Was catheterized in 11/2011 during surgery. Sleeping issue: she has ben taking daily benadryl.  No history of elevated BP during surgeries.. May be due to trying to get here with cast on in a rush. Recheck 140/70 after resting.  Has seperated from husband of 22 years. She is happy about it. "I feel free."   Review of Systems  Constitutional: Negative for fever, fatigue and unexpected weight change.  HENT: Negative for ear pain, congestion, sore throat, sneezing, trouble swallowing and sinus pressure.   Eyes: Negative for pain and itching.    Respiratory: Negative for cough, shortness of breath and wheezing.   Cardiovascular: Negative for chest pain, palpitations and leg swelling.  Gastrointestinal: Negative for nausea, abdominal pain, diarrhea, constipation and blood in stool.  Genitourinary: Positive for frequency. Negative for dysuria, hematuria, vaginal discharge, difficulty urinating and menstrual problem.       Urinary incontinence.  Skin: Negative for rash.  Neurological: Negative for syncope, weakness, light-headedness, numbness and headaches.  Psychiatric/Behavioral: Negative for confusion and dysphoric mood. The patient is not nervous/anxious.        Difficulty sleeping, wishes to try trazodone.        Objective:   Physical Exam  Constitutional: Vital signs are normal. She appears well-developed and well-nourished. She is cooperative.  Non-toxic appearance. She does not appear ill. No distress.  HENT:  Head: Normocephalic.  Right Ear: Hearing, tympanic membrane, external ear and ear canal normal.  Left Ear: Hearing, tympanic membrane, external ear and ear canal normal.  Nose: Nose normal.  Eyes: Conjunctivae, EOM and lids are normal. Pupils are equal, round, and reactive to light. No foreign bodies found.  Neck: Trachea normal and normal range of motion. Neck supple. Carotid bruit is not present. No mass and no thyromegaly present.  Cardiovascular: Normal rate, regular rhythm, S1 normal, S2 normal, normal heart sounds and intact distal pulses.  Exam reveals no gallop.   No murmur heard. Pulmonary/Chest: Effort normal and breath sounds normal. No respiratory distress. She has no wheezes. She has no rhonchi. She has no rales.  Abdominal: Soft. Normal appearance and bowel sounds are normal. She exhibits no distension, no fluid wave, no abdominal bruit and no mass. There is no hepatosplenomegaly. There is no tenderness. There is no rebound, no guarding and no CVA tenderness. No hernia.  Musculoskeletal:       Left foot  in cast  Lymphadenopathy:    She has no cervical adenopathy.    She has no axillary adenopathy.  Neurological: She is alert. She has normal strength. No cranial nerve deficit or sensory deficit.  Skin: Skin is warm, dry and intact. No rash noted.  Psychiatric: Her speech is normal and behavior is normal. Judgment normal. Her mood appears not anxious. Cognition and memory are normal. She does not exhibit a depressed mood.          Assessment & Plan:  The patient's preventative maintenance and recommended screening tests for an annual wellness exam were reviewed in full today. Brought up to date unless services declined.  Counselled on the importance of diet, exercise, and its role in overall health and mortality. The patient's FH and SH was reviewed, including their home life, tobacco status, and drug and alcohol status.   Vaccines:Uptodate with all. DEXA: last in 2011, due now Colon: Dr. Jarold Motto. Procedure date: 10/29/2007  Next Due Date Colonoscopy: 10/2012 Mammo: last 08/2010 DVE/pap:not indicated due to age. No family history of uterine or ovarian cancer

## 2012-01-17 NOTE — Assessment & Plan Note (Signed)
Stop benadryl as may be contributing. Will send for culturte given cath and nitrate.  Interested in trying a med for this if needed.

## 2012-01-17 NOTE — Assessment & Plan Note (Signed)
Trial of trazodone. 

## 2012-01-17 NOTE — Patient Instructions (Addendum)
Stop benadryl and melatonin.  Trial of trazodone for sleep.  We will call with urine culture result.   Stop at front to set up mammogram and bone density.

## 2012-01-19 LAB — URINE CULTURE: Colony Count: >=100000

## 2012-01-24 ENCOUNTER — Telehealth: Payer: Self-pay

## 2012-01-24 MED ORDER — SOLIFENACIN SUCCINATE 5 MG PO TABS: 5.0000 mg | ORAL_TABLET | Freq: Every day | ORAL | Status: DC

## 2012-01-24 NOTE — Telephone Encounter (Signed)
Pt wants to try trial of Vesicare for urge incontinence to [facility] pharmacy.

## 2012-01-24 NOTE — Telephone Encounter (Signed)
Spoke with patient and advised results   

## 2012-02-20 ENCOUNTER — Encounter: Payer: Self-pay | Admitting: Family Medicine

## 2012-02-20 ENCOUNTER — Ambulatory Visit (INDEPENDENT_AMBULATORY_CARE_PROVIDER_SITE_OTHER): Payer: Medicare Other | Admitting: Family Medicine

## 2012-02-20 VITALS — BP 132/80 | HR 80 | Temp 97.9°F | Wt 181.0 lb

## 2012-02-20 DIAGNOSIS — R609 Edema, unspecified: Secondary | ICD-10-CM

## 2012-02-20 DIAGNOSIS — R600 Localized edema: Secondary | ICD-10-CM | POA: Insufficient documentation

## 2012-02-20 NOTE — Progress Notes (Signed)
Hip surgery 1/13, other hip surgery in 4/13.  Foot surgery 12/19/11 by Tillman Sers in [place] [phone]).  Was in w/c for weeks afterward.  Is now up using a cane.  Was seen in f/u in [place] 10 days ago.  Was out of the boot over the last few days.  Then she had local drainage and local edema in L leg and foot.  Was some better over the weekend and is some better still now.  She had some left over keflex and started taking it bid, started a few days ago.  The swelling started to improve as she was taking the keflex.    Here today for eval of edema  Meds, vitals, and allergies reviewed.   ROS: See HPI.  Otherwise, noncontributory.  nad Not sob, no inc in wob ctab rrr L leg with 1+edema L calf 40cm R calf 38 cm Calf not ttp x2 L foot with intact DP pulse  Surgical scar on L lateral foot with 1.5cm dehiscence that doesn't probe deeply.  No erythema and no purulent discharge

## 2012-02-20 NOTE — Assessment & Plan Note (Signed)
She isn't very recently out of surgery so I doubt DVT but I would check u/s tomorrow.  She has no CP or SOB.  If either, then to ER or dial 911.  She agrees.  Continue keflex for now and I'll d/w pt's PCP.  Pt agrees with plan.  >25 min spent with face to face with patient, >50% counseling and/or coordinating care.

## 2012-02-20 NOTE — Patient Instructions (Addendum)
Susan [name] will call about your referral tomorrow AM.   Keep taking the keflex in the meantime and I'll take to Dr. Ermalene Searing.   Take care. If you have any chest pain or shortness of breath, then go to the ER or dial 911.

## 2012-02-21 ENCOUNTER — Telehealth: Payer: Self-pay | Admitting: *Deleted

## 2012-02-21 ENCOUNTER — Encounter (INDEPENDENT_AMBULATORY_CARE_PROVIDER_SITE_OTHER): Payer: Medicare Other

## 2012-02-21 DIAGNOSIS — M7989 Other specified soft tissue disorders: Secondary | ICD-10-CM

## 2012-02-21 DIAGNOSIS — R600 Localized edema: Secondary | ICD-10-CM

## 2012-02-21 NOTE — Telephone Encounter (Signed)
Forward to PCP as FYI

## 2012-02-21 NOTE — Telephone Encounter (Signed)
Patient advised.  She states she is going to the beach and will keep it elevated and ice it down.

## 2012-02-21 NOTE — Telephone Encounter (Signed)
Call pt. Neg u/s.  No DVT.  Continue on the keflex for now.  If edema continues or the wound continues to drain any, then f/u with Dr. Ermalene Searing later in the week.

## 2012-02-21 NOTE — Telephone Encounter (Signed)
Call report: Normal venus duplex.

## 2012-02-22 NOTE — Telephone Encounter (Signed)
Noted  

## 2012-03-12 ENCOUNTER — Ambulatory Visit (INDEPENDENT_AMBULATORY_CARE_PROVIDER_SITE_OTHER)
Admission: RE | Admit: 2012-03-12 | Discharge: 2012-03-12 | Disposition: A | Discharge status: Home / Self Care | Payer: Medicare Other | Source: Ambulatory Visit | Attending: Family Medicine | Admitting: Family Medicine

## 2012-03-12 ENCOUNTER — Ambulatory Visit (INDEPENDENT_AMBULATORY_CARE_PROVIDER_SITE_OTHER): Payer: Medicare Other | Admitting: Family Medicine

## 2012-03-12 ENCOUNTER — Telehealth: Payer: Self-pay

## 2012-03-12 ENCOUNTER — Encounter: Payer: Self-pay | Admitting: Family Medicine

## 2012-03-12 VITALS — BP 122/80 | HR 76 | Temp 98.0°F | Wt 178.8 lb

## 2012-03-12 DIAGNOSIS — IMO0002 Reserved for concepts with insufficient information to code with codable children: Secondary | ICD-10-CM

## 2012-03-12 DIAGNOSIS — Z23 Encounter for immunization: Secondary | ICD-10-CM

## 2012-03-12 DIAGNOSIS — G4700 Insomnia, unspecified: Secondary | ICD-10-CM

## 2012-03-12 DIAGNOSIS — M79609 Pain in unspecified limb: Secondary | ICD-10-CM

## 2012-03-12 DIAGNOSIS — M79673 Pain in unspecified foot: Secondary | ICD-10-CM

## 2012-03-12 DIAGNOSIS — T1490XA Injury, unspecified, initial encounter: Secondary | ICD-10-CM

## 2012-03-12 MED ORDER — TRAZODONE HCL 50 MG PO TABS: 75.0000 mg | ORAL_TABLET | Freq: Every evening | ORAL | Status: DC | PRN

## 2012-03-12 NOTE — Progress Notes (Signed)
"  I'm tired of the foot trouble."   L foot still with some drainage, but less than prev and not purulent.  She was prev casted and had a hole near the heel of the cast.  She was concerned about a FB in the heel, but didn't mention this at the last OV.  Heel isn't painful unless she is weight bearing.    Insomnia controlled with 75mg  qhs trazodone.  Doing well w/o ADE.  Needs refill.   Meds, vitals, and allergies reviewed.   ROS: See HPI.  Otherwise, noncontributory.  nad Is not tender to the to touch at the heel, but is tender there walking.  No erythema at the heel.  Plantar fascia isn't ttp.  Prev wound cite with healing, no dehiscence.  Minimal drainage.

## 2012-03-12 NOTE — Telephone Encounter (Signed)
Call pt.  She does have foreign body on the xray.  She needs to call the clinic in [place] about f/u on this (or to see who they rec for second opinion).  I will notify PCP as a FYI as she is out of office today.

## 2012-03-12 NOTE — Telephone Encounter (Signed)
[name] called report for Left foot complete; report in EMR under imaging tab 03/12/12. Report given to Dr Para March; pt is not waiting.

## 2012-03-12 NOTE — Telephone Encounter (Signed)
Patient advised.  She says she has an appt with that MD on 03/23/12 and she asks if she could get a copy of the x-ray films and the report to take to that appt.

## 2012-03-12 NOTE — Patient Instructions (Addendum)
Go to the lab on the way out for your xrays.  We'll contact you with your xray report. I'll talk to Dr. Ermalene Searing in the meantime. Keep using the walker.

## 2012-03-12 NOTE — Telephone Encounter (Signed)
Please burn a copy for the patient and give to her.  Thanks.

## 2012-03-13 DIAGNOSIS — IMO0002 Reserved for concepts with insufficient information to code with codable children: Secondary | ICD-10-CM | POA: Insufficient documentation

## 2012-03-13 NOTE — Assessment & Plan Note (Signed)
continue 75mg  qhs trazodone. Doing well w/o ADE

## 2012-03-13 NOTE — Assessment & Plan Note (Signed)
Concern for FB on xray.  Will refer back to [facility] and will notify PCP.

## 2012-03-14 ENCOUNTER — Other Ambulatory Visit: Payer: Self-pay | Admitting: Family Medicine

## 2012-03-14 ENCOUNTER — Ambulatory Visit (HOSPITAL_COMMUNITY)
Admission: RE | Admit: 2012-03-14 | Discharge: 2012-03-14 | Disposition: A | Discharge status: Home / Self Care | Payer: Medicare Other | Source: Ambulatory Visit | Attending: Family Medicine | Admitting: Family Medicine

## 2012-03-14 DIAGNOSIS — Z780 Asymptomatic menopausal state: Secondary | ICD-10-CM

## 2012-03-14 DIAGNOSIS — Z13820 Encounter for screening for osteoporosis: Secondary | ICD-10-CM | POA: Insufficient documentation

## 2012-03-16 ENCOUNTER — Ambulatory Visit (INDEPENDENT_AMBULATORY_CARE_PROVIDER_SITE_OTHER): Payer: Medicare Other | Admitting: Family Medicine

## 2012-03-16 ENCOUNTER — Encounter: Payer: Self-pay | Admitting: Family Medicine

## 2012-03-16 VITALS — BP 132/78 | HR 78 | Temp 98.2°F | Wt 173.5 lb

## 2012-03-16 DIAGNOSIS — K429 Umbilical hernia without obstruction or gangrene: Secondary | ICD-10-CM

## 2012-03-16 NOTE — Patient Instructions (Addendum)
Watch umbilical hernias.. Call immediately if redness, and if you are unable to push it back in. If persistant soreness call for surgical referral   If pain is severe go to ER. Work on weight loss.Marland Kitchen Healthy eating and exercise as tolerated.

## 2012-03-16 NOTE — Progress Notes (Signed)
  Subjective:    Patient ID: Susan [name], female    DOB: 1940/08/13, 71 y.o.   MRN: [id number]  HPI 71 year old lady with recent foot surgery now dealing with foreign body and infection left in wound presents with painful umbilical hernia.  [place]  She has had umbilical hernia for years never been spore before. Started 1 week ago began with soreness, no redness, bulged out some. Started after wear tight pants. She has been using walker in last few months. She took ibuprofen, pain went away then returned yesterday, soreness. She has gained 10 lbs in last few months.    Review of Systems  Constitutional: Negative for fever and fatigue.  HENT: Negative for ear pain.   Eyes: Negative for pain.  Respiratory: Negative for chest tightness and shortness of breath.   Cardiovascular: Negative for chest pain, palpitations and leg swelling.  Gastrointestinal: Positive for abdominal pain. Negative for blood in stool and abdominal distention.  Genitourinary: Negative for dysuria.       Objective:   Physical Exam  Constitutional: She appears well-developed and well-nourished.  Neck: Normal range of motion. Neck supple.  Cardiovascular: Normal rate and regular rhythm.   Pulmonary/Chest: Effort normal and breath sounds normal.  Abdominal: Bowel sounds are normal. There is tenderness. There is no rebound and no guarding.       Reducible umbilical hernia, mildly tender          Assessment & Plan:

## 2012-03-27 ENCOUNTER — Ambulatory Visit (HOSPITAL_COMMUNITY): Payer: Medicare Other

## 2012-04-01 DIAGNOSIS — K429 Umbilical hernia without obstruction or gangrene: Secondary | ICD-10-CM | POA: Insufficient documentation

## 2012-04-01 NOTE — Assessment & Plan Note (Signed)
Minimally tender, reducible. Offered referral to surgeon or watch and wait plan. She has decided to watch the hernia. Reviewed emergent symptoms on when to go to ER>

## 2012-04-02 ENCOUNTER — Other Ambulatory Visit: Payer: Self-pay | Admitting: Family Medicine

## 2012-04-02 NOTE — Telephone Encounter (Signed)
Ok to refill 

## 2012-04-03 ENCOUNTER — Other Ambulatory Visit: Payer: Self-pay | Admitting: *Deleted

## 2012-04-03 ENCOUNTER — Other Ambulatory Visit: Payer: Self-pay

## 2012-04-03 MED ORDER — ROPINIROLE HCL 3 MG PO TABS: 3.0000 mg | ORAL_TABLET | Freq: Every day | ORAL | Status: DC

## 2012-04-03 NOTE — Telephone Encounter (Signed)
Pt left v/m requesting refill requip to [facility]. Note already sent from pharmacy requesting refill.Please advise.

## 2012-04-03 NOTE — Telephone Encounter (Signed)
Opened in error

## 2012-04-12 ENCOUNTER — Telehealth: Payer: Self-pay | Admitting: Family Medicine

## 2012-04-12 NOTE — Telephone Encounter (Signed)
Patient advised and will take zrytec

## 2012-04-12 NOTE — Telephone Encounter (Signed)
Patient Information:  Caller Name: Shevelle  Phone: [phone]  Patient: Susan [name]  Gender: Female  DOB: 1940-06-14  Age: 71 Years  PCP: Kerby Nora (Family Practice)   Symptoms  Reason For Call & Symptoms: Allergies with lots of sneezing and clear runny nose.  Reviewed Health History In EMR: Yes  Reviewed Medications In EMR: Yes  Reviewed Allergies In EMR: Yes  Date of Onset of Symptoms: 03/29/2012  Treatments Tried: Flonase, Chlorpheneramine, Allegra  Treatments Tried Worked: No  Guideline(s) Used:  Hay Fever - Nasal Allergies  No Protocol Available - Sick Adult  Disposition Per Guideline:   See Today or Tomorrow in Office  Reason For Disposition Reached:  Per manual triage, advsed to see MD within 24 hours for being treated by provider for allergies AND symptoms not responding or are worsening with prescribed treatment per Allergies guideline.   Advice Given:  N/A  Office Follow Up:  Does the office need to follow up with this patient?: Yes  Instructions For The Office: Advised to see MD for allergies with severe congestion X 2 weeks but caller preferred not to make appointment.  Stated will follow up if not better by 04/16/12.  RN Note:  Using multiple antihistamines, Sudafed,nasal spray and saline.

## 2012-04-12 NOTE — Telephone Encounter (Signed)
Has she ever tried zyrtec.. If not try instead of allegra. ? singulair?

## 2012-05-09 ENCOUNTER — Other Ambulatory Visit: Payer: Self-pay | Admitting: Neurosurgery

## 2012-05-09 DIAGNOSIS — R200 Anesthesia of skin: Secondary | ICD-10-CM

## 2012-05-09 DIAGNOSIS — M79601 Pain in right arm: Secondary | ICD-10-CM

## 2012-05-09 DIAGNOSIS — M479 Spondylosis, unspecified: Secondary | ICD-10-CM

## 2012-05-28 ENCOUNTER — Ambulatory Visit
Admission: RE | Admit: 2012-05-28 | Discharge: 2012-05-28 | Disposition: A | Discharge status: Home / Self Care | Payer: Medicare Other | Source: Ambulatory Visit | Attending: Neurosurgery | Admitting: Neurosurgery

## 2012-05-28 DIAGNOSIS — R200 Anesthesia of skin: Secondary | ICD-10-CM

## 2012-05-28 DIAGNOSIS — M79601 Pain in right arm: Secondary | ICD-10-CM

## 2012-05-28 DIAGNOSIS — M479 Spondylosis, unspecified: Secondary | ICD-10-CM

## 2012-05-28 DIAGNOSIS — M79602 Pain in left arm: Secondary | ICD-10-CM

## 2012-06-04 ENCOUNTER — Ambulatory Visit: Payer: Medicare Other | Admitting: Cardiovascular Disease

## 2012-06-12 ENCOUNTER — Ambulatory Visit (HOSPITAL_COMMUNITY): Payer: Medicare Other

## 2012-06-12 ENCOUNTER — Telehealth: Payer: Self-pay | Admitting: Family Medicine

## 2012-06-12 MED ORDER — TRAZODONE HCL 50 MG PO TABS: 75.0000 mg | ORAL_TABLET | Freq: Every evening | ORAL | Status: DC | PRN

## 2012-06-12 NOTE — Telephone Encounter (Signed)
Called to request refill of Trazodone 50 mg sig: 1.5 po at bedtime for sleep.  Reports last RX says to take 1 tablet (50 mg) at bedtime but she does not get full benefit unless takes 1.5 tabs (75 mg). Has two tablets remaining.  Verified per Epic, Trazodone is not an active medication.  Last office visit 03/16/12.  Please call refill to [facility].

## 2012-06-19 ENCOUNTER — Ambulatory Visit (HOSPITAL_COMMUNITY): Payer: Medicare Other

## 2012-06-25 ENCOUNTER — Ambulatory Visit (HOSPITAL_COMMUNITY): Payer: Medicare Other

## 2012-07-30 ENCOUNTER — Encounter: Payer: Self-pay | Admitting: Family Medicine

## 2012-07-30 ENCOUNTER — Ambulatory Visit (INDEPENDENT_AMBULATORY_CARE_PROVIDER_SITE_OTHER): Payer: Medicare Other | Admitting: Family Medicine

## 2012-07-30 VITALS — BP 120/64 | HR 64 | Temp 98.0°F | Ht 63.0 in | Wt 176.5 lb

## 2012-07-30 DIAGNOSIS — J069 Acute upper respiratory infection, unspecified: Secondary | ICD-10-CM

## 2012-07-30 MED ORDER — AMOXICILLIN 500 MG PO CAPS: 1000.0000 mg | ORAL_CAPSULE | Freq: Two times a day (BID) | ORAL | Status: DC

## 2012-07-30 NOTE — Progress Notes (Signed)
Patient Name: Susan [name] Date of Birth: 02-Feb-1941 Medical Record Number: [id number]  History of Present Illness:  Patent presents with runny nose, sneezing, malaise and minimal / low-grade fever .  Feeling bad for about a week. Can't breathe through nose. Not usually probs with sinuses.  ? recent exposure to others with similar symptoms.   The patent denies sore throat as the primary complaint. Denies sthortness of breath/wheezing, high fever, chest pain, rhinits for more than 14 days, significant myalgia, otalgia, facial pain, abdominal pain, changes in bowel or bladder.  PMH, PHS, Allergies, Problem List, Medications, Family History, and Social History have all been reviewed.  Patient Active Problem List  Diagnosis  . HERPES SIMPLEX INFECTION, RECURRENT  . HYPERCHOLESTEROLEMIA  . RESTLESS LEG SYNDROME  . ALLERGIC RHINITIS  . DIVERTICULOSIS, COLON  . ANKLE PAIN, BILATERAL  . FOOT PAIN, BILATERAL  . DYSPNEA  . HIP REPLACEMENT, LEFT, HX OF  . DDD (degenerative disc disease)  . Diastolic dysfunction  . Preoperative cardiovascular examination  . Urinary incontinence, urge  . Insomnia  . Leg edema  . FB (foreign body)  . Umbilical hernia    Past Medical History  Diagnosis Date  . Diverticulosis of colon (without mention of hemorrhage)   . 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 Acute peptic ulcer, unspecified site, with hemorrhage and perforation, without mention of obstruction   . Chronic airway obstruction, not elsewhere classified   . Ventral hernia   . Hematochezia     Past Surgical History  Procedure Laterality Date  . Bunionectomy    . Total hip arthroplasty  2007    left  . Appendectomy    . Tubal ligation    . Cosmetic surgery  1970's    abdomen. Tummy Tuck    History   Social History  . Marital Status: Married    Spouse Name: N/A    Number of Children: 3  . Years of Education: N/A   Occupational History  . self employeed       Owns beauty shop   Social History Main Topics  . Smoking status: Former Smoker -- 40 years    Types: Cigarettes    Quit date: 08/29/2000  . Smokeless tobacco: Never Used  . Alcohol Use: Yes     Comment: rare  . Drug Use: No  . Sexually Active: Not on file   Other Topics Concern  . Not on file   Social History Narrative   Diet: Moderate, low fat, skips meals, some fruit and veggies   Regular exercise:  No   HCPOA is [facility] and [employer],  Has living will, Not sure of code status (reviewed 2013)    Family History  Problem Relation Age of Onset  . Diabetes Mother   . Coronary artery disease Mother   . Lung disease Mother   . Heart disease Mother     CAD  . Coronary artery disease Father   . Kidney disease Father   . Diabetes Father   . Heart disease Father     CAD  . Cancer Neg Hx     No Known Allergies  Current Outpatient Prescriptions on File Prior to Visit  Medication Sig Dispense Refill  . traZODone (DESYREL) 50 MG tablet Take 1.5 tablets (75 mg total) by mouth at bedtime as needed for sleep.  45 tablet  1  . valACYclovir (VALTREX) 500 MG tablet Take 500 mg by mouth 2 (two) times daily  as needed.       No current facility-administered medications on file prior to visit.    Review of Systems: as above, eating and drinking - tolerating PO. Urinating normally. No excessive vomitting or diarrhea. O/w as above.  Physical Exam:  Filed Vitals:   07/30/12 0929  BP: 120/64  Pulse: 64  Temp: 98 F (36.7 C)  TempSrc: Oral  Height: 5\' 3"  (1.6 m)  Weight: 176 lb 8 oz (80.06 kg)  SpO2: 97%    GEN: WDWN, Non-toxic, Atraumatic, normocephalic. A and O x 3. HEENT: Oropharynx clear without exudate, MMM, no significant LAD, mild rhinnorhea Ears: TM clear, COL visualized with good landmarks CV: RRR, no m/g/r. Pulm: CTA B, no wheezes, rhonchi, or crackles, normal respiratory effort. EXT: no c/c/e Psych: well oriented, neither depressed nor anxious in  appearance  A/P: 1. URI. Supportive care reviewed with patient. See patient instruction section.  R max sinus - ? Early sinus  amox to hold

## 2012-08-10 ENCOUNTER — Other Ambulatory Visit: Payer: Self-pay | Admitting: Family Medicine

## 2012-08-22 ENCOUNTER — Encounter: Payer: Self-pay | Admitting: Family Medicine

## 2012-08-22 ENCOUNTER — Ambulatory Visit (INDEPENDENT_AMBULATORY_CARE_PROVIDER_SITE_OTHER): Payer: Medicare Other | Admitting: Family Medicine

## 2012-08-22 VITALS — BP 144/90 | HR 60 | Temp 97.6°F | Wt 176.2 lb

## 2012-08-22 DIAGNOSIS — R5381 Other malaise: Secondary | ICD-10-CM

## 2012-08-22 DIAGNOSIS — R82998 Other abnormal findings in urine: Secondary | ICD-10-CM

## 2012-08-22 DIAGNOSIS — R8290 Unspecified abnormal findings in urine: Secondary | ICD-10-CM

## 2012-08-22 LAB — POCT URINALYSIS DIPSTICK
Bilirubin, UA: NEGATIVE
Ketones, UA: NEGATIVE
Leukocytes, UA: NEGATIVE
pH, UA: 6

## 2012-08-22 NOTE — Assessment & Plan Note (Addendum)
No UTI sxs. UA with pos nitrites and moderate blood but neg LE.  Micro without any RBC identified but there were crystals.  Also revealing concentrated urine.  No h/o kidney stones in past. I encouraged increased fluid intake.  I will send off urine culture to further evaluation for occult infection. To return if sxs persist or new sxs develop. Pt agrees with plan.

## 2012-08-22 NOTE — Progress Notes (Addendum)
  Subjective:    Patient ID: Susan [name], female    DOB: 01/27/1941, 72 y.o.   MRN: [id number]  HPI CC:?UTI  Pleasant 72yo pt of Dr. Ermalene Searing presents with concern with stronger urine smell.  Also has been having some malaise.   No fevers/chills, nausea/vomiting, abd pain, back pain, flank pain, dysuria, urgency, frequency, hematuria. Staying well hydrated with lots of water No h/o kidney stones  Feels malaise like when she has herpes breakout, but currently without rash anywhere.  Started taking valtrex just in case.  R shoulder pain - started going to [place] physical therapy last week, had "needling" procedure which is deeper than acupuncture.  Sees Dr. Lajoyce Corners for steroid shots in past.  Wonders if she could return here.  I did mention that several providers here do steroid shots but to ask up front prior to scheduling appt.  Seen here 07/30/2012 with URI, WASP amoxicillin script provided which she did fill  Past Medical History  Diagnosis Date  . Diverticulosis of colon (without mention of hemorrhage)   . 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 Acute peptic ulcer, unspecified site, with hemorrhage and perforation, without mention of obstruction   . Chronic airway obstruction, not elsewhere classified   . Ventral hernia   . Hematochezia      Review of Systems Per HPI    Objective:   Physical Exam  Nursing note and vitals reviewed. Constitutional: She appears well-developed and well-nourished. No distress.  Abdominal: Soft. Normal appearance and bowel sounds are normal. She exhibits no distension and no mass. There is no hepatosplenomegaly. There is no tenderness. There is no rigidity, no rebound, no guarding, no CVA tenderness and negative Murphy's sign.       Assessment & Plan:

## 2012-08-22 NOTE — Patient Instructions (Signed)
Urine is looking concentrated, but no infection. I am worried you are a bit dehydrated.  Increase water intake, drink some cranberry juice as well Let us know if symptoms worsen or persist.

## 2012-08-22 NOTE — Addendum Note (Signed)
Addended by: Eustaquio Boyden on: 08/22/2012 09:48 AM   Modules accepted: Orders

## 2012-08-24 ENCOUNTER — Other Ambulatory Visit: Payer: Self-pay | Admitting: Family Medicine

## 2012-08-24 LAB — URINE CULTURE: Colony Count: 70000

## 2012-08-24 MED ORDER — CIPROFLOXACIN HCL 250 MG PO TABS: 250.0000 mg | ORAL_TABLET | Freq: Two times a day (BID) | ORAL | Status: DC

## 2012-08-27 ENCOUNTER — Encounter: Payer: Self-pay | Admitting: Family Medicine

## 2012-08-27 ENCOUNTER — Ambulatory Visit (INDEPENDENT_AMBULATORY_CARE_PROVIDER_SITE_OTHER): Payer: Medicare Other | Admitting: Family Medicine

## 2012-08-27 VITALS — BP 120/72 | HR 65 | Temp 97.4°F | Ht 63.0 in | Wt 176.8 lb

## 2012-08-27 DIAGNOSIS — M19019 Primary osteoarthritis, unspecified shoulder: Secondary | ICD-10-CM

## 2012-08-27 DIAGNOSIS — M19011 Primary osteoarthritis, right shoulder: Secondary | ICD-10-CM

## 2012-08-27 MED ORDER — LIDOCAINE 5 % EX PTCH: 1.0000 | MEDICATED_PATCH | CUTANEOUS | Status: DC

## 2012-08-27 NOTE — Progress Notes (Signed)
Nature conservation officer at [facility] [address] [place] [address] Phone: [phone] Fax: [phone]  Date:  08/27/2012   Name:  Susan [name]   DOB:  12-14-1940   MRN:  [id number] Gender: female Age: 72 y.o.  Primary Physician:  Kerby Nora, MD  Evaluating MD: Hannah Beat, MD   Chief Complaint: Shoulder Pain   History of Present Illness:  Susan [name] is a 72 y.o. pleasant patient who presents with the following:  Right shoulder, has been seeing Dr. Lajoyce Corners. 2 years ago, and lifted 50 pounds of corn. Hurt her shoulder and her neck.  Larey Seat in the door and doing some therapy. The therapy has helped her shoulder somewhat. She has had films at [facility], and Dr. Lajoyce Corners told her that the primary problem is osteoarthritis of the shoulder.  Patient does have crepitus with movement in the shoulder. She has had some good response to prior intra-articular injections done at [facility] orth[facility].  Shoulder.   Patient Active Problem List  Diagnosis  . HERPES SIMPLEX INFECTION, RECURRENT  . HYPERCHOLESTEROLEMIA  . RESTLESS LEG SYNDROME  . ALLERGIC RHINITIS  . DIVERTICULOSIS, COLON  . ANKLE PAIN, BILATERAL  . FOOT PAIN, BILATERAL  . DYSPNEA  . HIP REPLACEMENT, LEFT, HX OF  . DDD (degenerative disc disease)  . Diastolic dysfunction  . Preoperative cardiovascular examination  . Urinary incontinence, urge  . Insomnia  . Leg edema  . FB (foreign body)  . Umbilical hernia  . Malaise    Past Medical History  Diagnosis Date  . Diverticulosis of colon (without mention of hemorrhage)   . 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 Acute peptic ulcer, unspecified site, with hemorrhage and perforation, without mention of obstruction   . Chronic airway obstruction, not elsewhere classified   . Ventral hernia   . Hematochezia     Past Surgical History  Procedure Laterality Date  . Bunionectomy    . Total hip arthroplasty  2007    left  .  Appendectomy    . Tubal ligation    . Cosmetic surgery  1970's    abdomen. Tummy Tuck    History   Social History  . Marital Status: Married    Spouse Name: N/A    Number of Children: 3  . Years of Education: N/A   Occupational History  . self employeed     Owns beauty shop   Social History Main Topics  . Smoking status: Former Smoker -- 40 years    Types: Cigarettes    Quit date: 08/29/2000  . Smokeless tobacco: Never Used  . Alcohol Use: Yes     Comment: rare  . Drug Use: No  . Sexually Active: Not on file   Other Topics Concern  . Not on file   Social History Narrative   Diet: Moderate, low fat, skips meals, some fruit and veggies   Regular exercise:  No   HCPOA is [facility] and [employer],  Has living will, Not sure of code status (reviewed 2013)    Family History  Problem Relation Age of Onset  . Diabetes Mother   . Coronary artery disease Mother   . Lung disease Mother   . Heart disease Mother     CAD  . Coronary artery disease Father   . Kidney disease Father   . Diabetes Father   . Heart disease Father     CAD  . Cancer Neg Hx  No Known Allergies  Medication list has been reviewed and updated.  Outpatient Prescriptions Prior to Visit  Medication Sig Dispense Refill  . ciprofloxacin (CIPRO) 250 MG tablet Take 1 tablet (250 mg total) by mouth 2 (two) times daily.  10 tablet  0  . traZODone (DESYREL) 50 MG tablet TAKE ONE AND ONE-HALF TABLETS AT BEDTIMEIF NEEDED FOR SLEEP  30 tablet  1  . valACYclovir (VALTREX) 500 MG tablet Take 500 mg by mouth 2 (two) times daily as needed.       No facility-administered medications prior to visit.    Review of Systems:   GEN: No fevers, chills. Nontoxic. Primarily MSK c/o today. MSK: Detailed in the HPI GI: tolerating PO intake without difficulty Neuro: No numbness, parasthesias, or tingling associated. Otherwise the pertinent positives of the ROS are noted above.    Physical Examination: BP  120/72  Pulse 65  Temp(Src) 97.4 F (36.3 C) (Oral)  Ht 5\' 3"  (1.6 m)  Wt 176 lb 12 oz (80.173 kg)  BMI 31.32 kg/m2  SpO2 98%  Ideal Body Weight: Weight in (lb) to have BMI = 25: 140.8   GEN: WDWN, NAD, Non-toxic, Alert & Oriented x 3 HEENT: Atraumatic, Normocephalic.  Ears and Nose: No external deformity. EXTR: No clubbing/cyanosis/edema NEURO: Normal gait.  PSYCrH: Normally interactive. Conversant. Not depressed or anxious appearing.  Calm demeanor.   Shoulder: R Inspection: No muscle wasting or winging NOTABLE CREPITUS Ecchymosis/edema: neg  AC joint, scapula, clavicle: NT Cervical spine: NT, full ROM Spurling's: neg Abduction: full, 5/5 Flexion: full, 5/5 IR, full, lift-off: 5/5 ER at neutral: full, 5/5 AC crossover and compression: neg Neer: neg Hawkins: neg Drop Test: neg Empty Can: neg Supraspinatus insertion: NT Bicipital groove: NT Speed's: neg Yergason's: neg Sulcus sign: neg Scapular dyskinesis: none C5-T1 intact Sensation intact Grip 5/5   Assessment and Plan: Osteoarthritis of glenohumeral joint, right   No impingement. Cough is very strong. Notable crepitus with loading the glenohumeral joint motion. Suspect primary osteoarthritis exacerbation of the glenohumeral joint.  Intrarticular Shoulder Injection, RIGHT Verbal consent was obtained from the patient. Risks including infection explained and contrasted with benefits and alternatives. Patient prepped with Chloraprep and Ethyl Chloride used for anesthesia. An intraarticular shoulder injection was performed using the posterior approach. The patient tolerated the procedure well and had decreased pain post injection. No complications. Injection: 8 cc of Lidocaine 1% and 2 cc of Depo-Medrol 40 mg. Needle: 22 gauge   Signed, Shannia Jacuinde T. Kylin Dubs, MD 08/27/2012 10:15 AM

## 2012-09-26 ENCOUNTER — Other Ambulatory Visit: Payer: Self-pay | Admitting: *Deleted

## 2012-09-26 NOTE — Telephone Encounter (Signed)
Last filled 08/28/12 

## 2012-09-27 MED ORDER — TRAZODONE HCL 50 MG PO TABS: ORAL_TABLET | ORAL | Status: DC

## 2012-10-11 ENCOUNTER — Ambulatory Visit (INDEPENDENT_AMBULATORY_CARE_PROVIDER_SITE_OTHER)
Admission: RE | Admit: 2012-10-11 | Discharge: 2012-10-11 | Disposition: A | Discharge status: Home / Self Care | Payer: Medicare Other | Source: Ambulatory Visit | Attending: Family Medicine | Admitting: Family Medicine

## 2012-10-11 ENCOUNTER — Ambulatory Visit (INDEPENDENT_AMBULATORY_CARE_PROVIDER_SITE_OTHER): Payer: Medicare Other | Admitting: Family Medicine

## 2012-10-11 ENCOUNTER — Encounter: Payer: Self-pay | Admitting: Family Medicine

## 2012-10-11 VITALS — BP 120/72 | HR 89 | Temp 98.2°F | Ht 63.0 in | Wt 172.5 lb

## 2012-10-11 DIAGNOSIS — I519 Heart disease, unspecified: Secondary | ICD-10-CM

## 2012-10-11 DIAGNOSIS — R0609 Other forms of dyspnea: Secondary | ICD-10-CM

## 2012-10-11 DIAGNOSIS — M25519 Pain in unspecified shoulder: Secondary | ICD-10-CM

## 2012-10-11 DIAGNOSIS — M25511 Pain in right shoulder: Secondary | ICD-10-CM | POA: Insufficient documentation

## 2012-10-11 DIAGNOSIS — R0989 Other specified symptoms and signs involving the circulatory and respiratory systems: Secondary | ICD-10-CM

## 2012-10-11 DIAGNOSIS — R0600 Dyspnea, unspecified: Secondary | ICD-10-CM

## 2012-10-11 DIAGNOSIS — R0602 Shortness of breath: Secondary | ICD-10-CM

## 2012-10-11 DIAGNOSIS — G4733 Obstructive sleep apnea (adult) (pediatric): Secondary | ICD-10-CM

## 2012-10-11 DIAGNOSIS — I5189 Other ill-defined heart diseases: Secondary | ICD-10-CM

## 2012-10-11 LAB — CBC WITH DIFFERENTIAL/PLATELET
Basophils Absolute: 0 10*3/uL (ref 0.0–0.1)
Basophils Relative: 0.4 % (ref 0.0–3.0)
Eosinophils Absolute: 0.1 10*3/uL (ref 0.0–0.7)
HCT: 40.4 % (ref 36.0–46.0)
Hemoglobin: 13.7 g/dL (ref 12.0–15.0)
Lymphs Abs: 1.6 10*3/uL (ref 0.7–4.0)
MCHC: 34 g/dL (ref 30.0–36.0)
Neutro Abs: 6 10*3/uL (ref 1.4–7.7)
RDW: 15.2 % — ABNORMAL HIGH (ref 11.5–14.6)

## 2012-10-11 LAB — COMPREHENSIVE METABOLIC PANEL
ALT: 17 U/L (ref 0–35)
AST: 18 U/L (ref 0–37)
Alkaline Phosphatase: 86 U/L (ref 39–117)
Sodium: 140 mEq/L (ref 135–145)
Total Bilirubin: 0.7 mg/dL (ref 0.3–1.2)
Total Protein: 7.4 g/dL (ref 6.0–8.3)

## 2012-10-11 MED ORDER — TRAMADOL HCL 50 MG PO TABS: 50.0000 mg | ORAL_TABLET | Freq: Three times a day (TID) | ORAL | Status: DC | PRN

## 2012-10-11 NOTE — Assessment & Plan Note (Addendum)
Likely osteoarthrits.  No improvement in months with steroid injections, NSAIDs, PT, trigger point injections, lidoderm patch..  Will eval with X-ray.  Depending on results consider MRI shoulder eval prior to seeing specialist to prepare for the visit.

## 2012-10-11 NOTE — Assessment & Plan Note (Addendum)
Likely multifactorial but given recent increase in past few months will evaluate further.  EKG shows no specific cause, occ PACs not likely causing issues. Spirometry showed likely moderate  Restriction, but pt unable to adequately perform test..  willsend for full PFTS pre and post albuterolo at hospital.  Eval with labs including hg and TSH, etc.

## 2012-10-11 NOTE — Progress Notes (Signed)
Subjective:    Patient ID: Susan [name], female    DOB: 10/02/1940, 72 y.o.   MRN: [id number]  HPI  72 year old female with diastolic dysfunction presents with  new shortness of breath with exertion. Some SOB at rest as well. Occ hearing wheezing. No cough, No mucus.  No fever. She has herpes break out on hip and feels usual associated    She saw Dr. Mariah Milling for preop eval prior to her hip replacement in 03/2011. ECHO: EF 55-65 %, diastolic dysf. DOE was felt to be multifactorial from diastolic dys., obesity and deconditioning, tachycardia, hx of smoking. EKG  At that OV showed tachycardia: was started on BBlocker which she has since stopped given SE.  HX of nml spirometry in 2012 despite years of smoking > 50 pack year history. Quit 2002.  Today brady on pulse ox , but nml rate on EKG.  No swelling, no bleeding.  Her right shoulder has been hurting for years, worse in last few months. Cannot lift arm above head without pain. Dx with OA in right shoulder...  In 08/2012 has steroid injection, minimally effective... 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 Using ibuprofen 12 a day at times with minimal improvement. PT and needling in trigger points in trapezius made it more painful.  Has had itching with vicodin in past. Has tolerated tramadol in past.  Has appt with Dr. Ramond Marrow, Shoulder specialist in [place].  Has appointment with Dr. Channing Mutters for  Neck pain ongoing for years. DDD in past on MRI. Told in past nothing for surgery. Has appt to see him again.  Had sleep study several years ago.. Diagnosed with sleep apnea.. But unable to tolerate mask   Review of Systems  Constitutional: Positive for fatigue. Negative for fever.  HENT: Negative for ear pain, congestion, sneezing and sinus pressure.   Eyes: Negative for pain, discharge and itching.       Objective:   Physical Exam  Constitutional: Vital signs are normal. She appears well-developed and  well-nourished. She is cooperative.  Non-toxic appearance. She does not appear ill. No distress.  HENT:  Head: Normocephalic.  Right Ear: Hearing, tympanic membrane, external ear and ear canal normal. Tympanic membrane is not erythematous, not retracted and not bulging.  Left Ear: Hearing, tympanic membrane, external ear and ear canal normal. Tympanic membrane is not erythematous, not retracted and not bulging.  Nose: No mucosal edema or rhinorrhea. Right sinus exhibits no maxillary sinus tenderness and no frontal sinus tenderness. Left sinus exhibits no maxillary sinus tenderness and no frontal sinus tenderness.  Mouth/Throat: Uvula is midline, oropharynx is clear and moist and mucous membranes are normal.  Eyes: Conjunctivae, EOM and lids are normal. Pupils are equal, round, and reactive to light. No foreign bodies found.  Neck: Trachea normal and normal range of motion. Neck supple. Carotid bruit is not present. No mass and no thyromegaly present.  Cardiovascular: Normal rate, regular rhythm, S1 normal, S2 normal, normal heart sounds, intact distal pulses and normal pulses.  Exam reveals no gallop and no friction rub.   No murmur heard. Pulmonary/Chest: Effort normal and breath sounds normal. Not tachypneic. No respiratory distress. She has no decreased breath sounds. She has no wheezes. She has no rhonchi. She has no rales.  Abdominal: Soft. Normal appearance and bowel sounds are normal. There is no tenderness.  Musculoskeletal:  Deformity of feet B. TTP on right lateral shoulder and anterior, neg crossover, neg drop arm, neg neer's, empty can,.Marland KitchenMarland Kitchen  sign of impingement.  PAin with int and ext rotation of right shoulder. Neg spurling's.  No vertebral ttp.    Neurological: She is alert.  Skin: Skin is warm, dry and intact. No rash noted.  Psychiatric: Her speech is normal and behavior is normal. Judgment and thought content normal. Her mood appears not anxious. Cognition and memory are normal.  She does not exhibit a depressed mood.          Assessment & Plan:

## 2012-10-11 NOTE — Patient Instructions (Addendum)
Tramadol for pain as needed. Call if not helping. Stop ibuprofen for now. We will call with Xray and lab results. Keep appt with shoulder specialist and neck specialist.  Stop at front desk to set up full lung function tests.

## 2012-10-11 NOTE — Assessment & Plan Note (Signed)
May have progressed from last check... No fluid overload today and no rales. Consider repeat ECHO if other testing negative.

## 2012-10-22 ENCOUNTER — Telehealth: Payer: Self-pay | Admitting: *Deleted

## 2012-10-22 DIAGNOSIS — M25511 Pain in right shoulder: Secondary | ICD-10-CM

## 2012-10-22 NOTE — Telephone Encounter (Signed)
Patient called stating that she needs you to order an MRI of her right shoulder ASAP. Patient states that she has an appointment scheduled with a shoulder specialist at [facility] on 10/29/12.

## 2012-10-23 ENCOUNTER — Ambulatory Visit: Payer: Self-pay | Admitting: Family Medicine

## 2012-10-23 LAB — PULMONARY FUNCTION TEST

## 2012-10-24 ENCOUNTER — Ambulatory Visit
Admission: RE | Admit: 2012-10-24 | Discharge: 2012-10-24 | Disposition: A | Discharge status: Home / Self Care | Payer: Medicare Other | Source: Ambulatory Visit | Attending: Family Medicine | Admitting: Family Medicine

## 2012-10-24 DIAGNOSIS — M25511 Pain in right shoulder: Secondary | ICD-10-CM

## 2012-10-26 LAB — PULMONARY FUNCTION TEST

## 2012-11-08 ENCOUNTER — Telehealth: Payer: Self-pay | Admitting: Family Medicine

## 2012-11-08 DIAGNOSIS — R0602 Shortness of breath: Secondary | ICD-10-CM

## 2012-11-08 NOTE — Telephone Encounter (Signed)
Pt remains symptomatic for SOB after nml spirometry.  Will refer for ECHO.

## 2012-11-14 ENCOUNTER — Telehealth: Payer: Self-pay

## 2012-11-14 MED ORDER — IBUPROFEN 800 MG PO TABS: 800.0000 mg | ORAL_TABLET | Freq: Three times a day (TID) | ORAL | Status: DC | PRN

## 2012-11-14 NOTE — Telephone Encounter (Signed)
Will send in.. But she needs to make sure to take with food, stop if stoimach upset and limit use as long term use of NSAIDs has been shown to be related to cardiovascular events.

## 2012-11-14 NOTE — Telephone Encounter (Signed)
Pt request prescription for Motrin 800 mg sent to [facility] for shoulder aching. Tramadol causes pt to be "disfunctional", very sleepy and pt is stopping Tramadol. Someone gave pt Motrin 800 mg last night and it worked well.Please advise.

## 2012-11-14 NOTE — Telephone Encounter (Signed)
Patient advised.

## 2012-11-15 ENCOUNTER — Encounter: Payer: Self-pay | Admitting: Family Medicine

## 2012-11-27 ENCOUNTER — Other Ambulatory Visit (INDEPENDENT_AMBULATORY_CARE_PROVIDER_SITE_OTHER): Payer: Medicare Other

## 2012-11-27 ENCOUNTER — Other Ambulatory Visit: Payer: Self-pay

## 2012-11-27 ENCOUNTER — Other Ambulatory Visit: Payer: Self-pay | Admitting: Family Medicine

## 2012-11-27 DIAGNOSIS — R079 Chest pain, unspecified: Secondary | ICD-10-CM

## 2012-11-27 DIAGNOSIS — R0602 Shortness of breath: Secondary | ICD-10-CM

## 2012-12-07 ENCOUNTER — Encounter: Payer: Self-pay | Admitting: Family Medicine

## 2012-12-07 ENCOUNTER — Ambulatory Visit (INDEPENDENT_AMBULATORY_CARE_PROVIDER_SITE_OTHER): Payer: Medicare Other | Admitting: Family Medicine

## 2012-12-07 VITALS — BP 120/60 | HR 77 | Temp 97.8°F | Ht 63.0 in | Wt 175.0 lb

## 2012-12-07 DIAGNOSIS — R0602 Shortness of breath: Secondary | ICD-10-CM

## 2012-12-07 DIAGNOSIS — R0600 Dyspnea, unspecified: Secondary | ICD-10-CM

## 2012-12-07 DIAGNOSIS — R0989 Other specified symptoms and signs involving the circulatory and respiratory systems: Secondary | ICD-10-CM

## 2012-12-07 DIAGNOSIS — R0609 Other forms of dyspnea: Secondary | ICD-10-CM

## 2012-12-07 NOTE — Assessment & Plan Note (Signed)
Most likely multifactorial from deconditioning and obesity.  Will eval with cardiolyte  Given possible surgery in next year.  if negative we will have her slowly increase exercsie and activity as tolerated... Silver sneakers etc.

## 2012-12-07 NOTE — Progress Notes (Signed)
  Subjective:    Patient ID: Susan [name], female    DOB: 03-Jul-1940, 72 y.o.   MRN: [id number]  HPI  71 year old female presents for follow up of dyspnea on exertion. Cannot walk into  Our office without shortness of breath. SOB going up stairs, but she avoids. No tachycardia, no chest pian, no chest tightness. No wheeze.   Some increase in exercise seems to be helping.  Hg nml, TSH nml PFTS full at [facility] were normal on 6/6.  ECHO: 7/8 nml EF, no abnormality EKG shows no specific cause, occ PACs not likely causing issues.   She saw Dr. Mariah Milling for preop eval prior to her hip replacement in 03/2011. ECHO: EF 55-65 %, diastolic dysf. DOE was felt to be multifactorial from diastolic dys., obesity and deconditioning, tachycardia, hx of smoking.  EKG At that OV showed tachycardia: was started on BBlocker which she has since stopped given SE.  Right shoulder pain: very abnormal MRI.Marland Kitchen Has seen specialist, recommended reverse shoulder replacement.. Has an appt with specialist. She may have surgery in next year.  Review of Systems  Constitutional: Negative for fever and fatigue.  HENT: Negative for ear pain.   Eyes: Negative for pain.  Respiratory: Negative for chest tightness and shortness of breath.   Cardiovascular: Negative for chest pain, palpitations and leg swelling.  Gastrointestinal: Negative for abdominal pain.  Genitourinary: Negative for dysuria.       Objective:   Physical Exam  Constitutional: Vital signs are normal. She appears well-developed and well-nourished. She is cooperative.  Non-toxic appearance. She does not appear ill. No distress.  HENT:  Head: Normocephalic.  Right Ear: Hearing, tympanic membrane, external ear and ear canal normal. Tympanic membrane is not erythematous, not retracted and not bulging.  Left Ear: Hearing, tympanic membrane, external ear and ear canal normal. Tympanic membrane is not erythematous, not retracted and not bulging.  Nose: No  mucosal edema or rhinorrhea. Right sinus exhibits no maxillary sinus tenderness and no frontal sinus tenderness. Left sinus exhibits no maxillary sinus tenderness and no frontal sinus tenderness.  Mouth/Throat: Uvula is midline, oropharynx is clear and moist and mucous membranes are normal.  Eyes: Conjunctivae, EOM and lids are normal. Pupils are equal, round, and reactive to light. No foreign bodies found.  Neck: Trachea normal and normal range of motion. Neck supple. Carotid bruit is not present. No mass and no thyromegaly present.  Cardiovascular: Normal rate, regular rhythm, S1 normal, S2 normal, normal heart sounds, intact distal pulses and normal pulses.  Exam reveals no gallop and no friction rub.   No murmur heard. Pulmonary/Chest: Effort normal and breath sounds normal. Not tachypneic. No respiratory distress. She has no decreased breath sounds. She has no wheezes. She has no rhonchi. She has no rales.  Abdominal: Soft. Normal appearance and bowel sounds are normal. There is no tenderness.  Neurological: She is alert.  Skin: Skin is warm, dry and intact. No rash noted.  Psychiatric: Her speech is normal and behavior is normal. Judgment and thought content normal. Her mood appears not anxious. Cognition and memory are normal. She does not exhibit a depressed mood.          Assessment & Plan:

## 2012-12-07 NOTE — Patient Instructions (Addendum)
Stop at front desk to schedule Cardiolite as long as this look good. The shortness of breath is likely from weight and deconditioning.  Work on gradually increasing exercise, consider silver sneaker.

## 2012-12-13 ENCOUNTER — Telehealth: Payer: Self-pay | Admitting: Family Medicine

## 2012-12-13 ENCOUNTER — Telehealth: Payer: Self-pay | Admitting: *Deleted

## 2012-12-13 DIAGNOSIS — R0600 Dyspnea, unspecified: Secondary | ICD-10-CM

## 2012-12-13 NOTE — Telephone Encounter (Signed)
Message copied by Ermalene Searing Tyan Lasure E on Thu Dec 13, 2012  1:08 PM ------      Message from: Ricki Miller H      Created: Thu Dec 13, 2012  9:25 AM      Regarding: Referral needed       Morning Dr. Ermalene Searing,      When scheduling the Myocardial Perfusion Imaging you ordered for pt, [facility] stated that they would prefer for [place] to see one of their cardiologists prior to scheduling the imaging.  If this is OK with you, please add an Internal Cardiology referral and I will get all scheduled.  Thank You! Linda T. ------

## 2012-12-13 NOTE — Telephone Encounter (Signed)
Orders cancelled.

## 2012-12-25 ENCOUNTER — Other Ambulatory Visit: Payer: Self-pay | Admitting: *Deleted

## 2012-12-25 MED ORDER — TRAZODONE HCL 50 MG PO TABS: 75.0000 mg | ORAL_TABLET | Freq: Every evening | ORAL | Status: DC | PRN

## 2012-12-31 ENCOUNTER — Ambulatory Visit: Payer: Self-pay | Admitting: Family Medicine

## 2013-01-04 ENCOUNTER — Encounter: Payer: Self-pay | Admitting: Family Medicine

## 2013-01-04 ENCOUNTER — Encounter: Payer: Self-pay | Admitting: Cardiovascular Disease

## 2013-01-04 ENCOUNTER — Ambulatory Visit (INDEPENDENT_AMBULATORY_CARE_PROVIDER_SITE_OTHER): Payer: Medicare Other | Admitting: Cardiovascular Disease

## 2013-01-04 VITALS — BP 120/64 | HR 90 | Ht 60.0 in | Wt 177.0 lb

## 2013-01-04 DIAGNOSIS — R0609 Other forms of dyspnea: Secondary | ICD-10-CM

## 2013-01-04 DIAGNOSIS — E7800 Pure hypercholesterolemia, unspecified: Secondary | ICD-10-CM

## 2013-01-04 DIAGNOSIS — R0602 Shortness of breath: Secondary | ICD-10-CM

## 2013-01-04 DIAGNOSIS — R0600 Dyspnea, unspecified: Secondary | ICD-10-CM

## 2013-01-04 DIAGNOSIS — R0989 Other specified symptoms and signs involving the circulatory and respiratory systems: Secondary | ICD-10-CM

## 2013-01-04 DIAGNOSIS — R0789 Other chest pain: Secondary | ICD-10-CM

## 2013-01-04 MED ORDER — ALBUTEROL SULFATE HFA 108 (90 BASE) MCG/ACT IN AERS: 2.0000 | INHALATION_SPRAY | Freq: Four times a day (QID) | RESPIRATORY_TRACT | Status: DC | PRN

## 2013-01-04 NOTE — Progress Notes (Signed)
Patient ID: Susan [name], female    DOB: 1940/11/17, 72 y.o.   MRN: [id number]  HPI Comments: Susan [name] is a very pleasant 72 year old man with a long history of smoking,   Mild COPD, status post left hip replacement,  right hip replacement, who  presents for preoperative evaluation and for symptoms of shortness of breath.  She reports having worsening shortness of breath over the past several months. She is uncertain if symptoms are from deconditioning, obesity, pain from her foot and shoulder. Friend presents with her today and concerned about underlying coronary artery disease as a cause of her shortness of breath. She's unable to treadmill secondary to severe foot pain.  She reports that she gets very wheezy at times, worse with exertion. Friend verifies this. She reports that she lived with the patient for 8 months and often hurt significant wheezing, coughing in the morning and night like a chronic bronchitis.   She reports that her husband died some time ago, she has been evicted from her house by the daughter of her late husband. She lived with him for 20 years.  Recent echocardiogram shows moderate concentric LVH, normal LV systolic function, diastolic dysfunction, borderline to mild pulmonary hypertension.  EKG shows sinus tachycardia with rate 90 beats per minute with rare APCs, no significant ST or T wave changes       Outpatient Encounter Prescriptions as of 01/04/2013  Medication Sig Dispense Refill  . ibuprofen (ADVIL,MOTRIN) 800 MG tablet Take 1 tablet (800 mg total) by mouth every 8 (eight) hours as needed for pain.  90 tablet  0  . lidocaine (LIDODERM) 5 % Place 1 patch onto the skin daily. Remove & Discard patch within 12 hours or as directed by MD  30 patch  5  . rOPINIRole (REQUIP) 3 MG tablet Take 3 mg by mouth at bedtime.      . traZODone (DESYREL) 50 MG tablet Take 1.5 tablets (75 mg total) by mouth at bedtime as needed for sleep.  30 tablet  0  . valACYclovir  (VALTREX) 500 MG tablet Take 500 mg by mouth 2 (two) times daily as needed.      Marland Kitchen albuterol (PROVENTIL HFA;VENTOLIN HFA) 108 (90 BASE) MCG/ACT inhaler Inhale 2 puffs into the lungs every 6 (six) hours as needed for wheezing.  1 Inhaler  2   Review of Systems  Constitutional: Negative.   HENT: Negative.   Eyes: Negative.   Respiratory: Positive for shortness of breath.   Cardiovascular: Negative.   Gastrointestinal: Negative.   Musculoskeletal: Negative.   Skin: Negative.   Neurological: Negative.   Psychiatric/Behavioral: Negative.   All other systems reviewed and are negative.    BP 120/64  Pulse 90  Ht 5' (1.524 m)  Wt 177 lb (80.287 kg)  BMI 34.57 kg/m2  Physical Exam  Nursing note and vitals reviewed. Constitutional: She is oriented to person, place, and time. She appears well-developed and well-nourished.  HENT:  Head: Normocephalic.  Nose: Nose normal.  Mouth/Throat: Oropharynx is clear and moist.  Eyes: Conjunctivae are normal. Pupils are equal, round, and reactive to light.  Neck: Normal range of motion. Neck supple. No JVD present.  Cardiovascular: Normal rate, regular rhythm, S1 normal, S2 normal, normal heart sounds and intact distal pulses.  Exam reveals no gallop and no friction rub.   No murmur heard. Pulmonary/Chest: Effort normal and breath sounds normal. No respiratory distress. She has no wheezes. She has no rales. She exhibits no tenderness.  Abdominal: Soft. Bowel sounds are normal. She exhibits no distension. There is no tenderness.  Musculoskeletal: Normal range of motion. She exhibits no edema and no tenderness.  Lymphadenopathy:    She has no cervical adenopathy.  Neurological: She is alert and oriented to person, place, and time. Coordination normal.  Skin: Skin is warm and dry. No rash noted. No erythema.  Psychiatric: She has a normal mood and affect. Her behavior is normal. Judgment and thought content normal.    Assessment and Plan

## 2013-01-04 NOTE — Assessment & Plan Note (Signed)
Shortness of breath likely from deconditioning, obesity, mild COPD from years of smoking. We will try albuterol inhaler at her request. Also scheduled pharmacologic Myoview to rule out ischemia. She is unable to treadmill given severe foot pain.

## 2013-01-04 NOTE — Assessment & Plan Note (Signed)
Cholesterol last year was actually quite good

## 2013-01-04 NOTE — Patient Instructions (Addendum)
You are doing well.  try albuterol for shortness of breath  We will schedule you for a stress test,  lexiscan myoview for shortness of breath                [facility] MYOVIEW  Your caregiver has ordered a Stress Test with nuclear imaging. The purpose of this test is to evaluate the blood supply to your heart muscle. This procedure is referred to as a "Non-Invasive Stress Test." This is because other than having an IV started in your vein, nothing is inserted or "invades" your body. Cardiac stress tests are done to find areas of poor blood flow to the heart by determining the extent of coronary artery disease (CAD). Some patients exercise on a treadmill, which naturally increases the blood flow to your heart, while others who are  unable to walk on a treadmill due to physical limitations have a pharmacologic/chemical stress agent called Lexiscan . This medicine will mimic walking on a treadmill by temporarily increasing your coronary blood flow.   Please note: these test may take anywhere between 2-4 hours to complete  PLEASE REPORT TO [facility] MEDICAL MALL ENTRANCE  THE VOLUNTEERS AT THE FIRST DESK WILL DIRECT YOU WHERE TO GO  Date of Procedure:_________Tuesday, August 20_____________  Arrival Time for Procedure:__________7:30 am____________________  Instructions regarding medication:   ____ : Hold diabetes medication morning of procedure  ____:  Hold betablocker(s) night before procedure and morning of procedure  ____:  Hold other medications as follows:_________________________________________________________________________________________________________________________________________________________________________________________________________________________________________________________________________________________  PLEASE NOTIFY THE OFFICE AT LEAST 24 HOURS IN ADVANCE IF YOU ARE UNABLE TO KEEP YOUR APPOINTMENT.  [phone] AND  PLEASE NOTIFY NUCLEAR MEDICINE AT [facility] AT LEAST  24 HOURS IN ADVANCE IF YOU ARE UNABLE TO KEEP YOUR APPOINTMENT. [phone]  How to prepare for your Myoview test:  1. Do not eat or drink after midnight 2. No caffeine for 24 hours prior to test 3. No smoking 24 hours prior to test. 4. Your medication may be taken with water.  If your doctor stopped a medication because of this test, do not take that medication. 5. Ladies, please do not wear dresses.  Skirts or pants are appropriate. Please wear a short sleeve shirt. 6. No perfume, cologne or lotion. 7. Wear comfortable walking shoes. No heels!      Marland Kitchenarm     Please call us if you have new issues that need to be addressed before your next appt.

## 2013-01-08 ENCOUNTER — Encounter: Payer: Self-pay | Admitting: *Deleted

## 2013-01-09 ENCOUNTER — Ambulatory Visit: Payer: Self-pay | Admitting: Cardiovascular Disease

## 2013-01-09 DIAGNOSIS — R0602 Shortness of breath: Secondary | ICD-10-CM

## 2013-01-10 ENCOUNTER — Other Ambulatory Visit: Payer: Self-pay

## 2013-01-10 DIAGNOSIS — R0602 Shortness of breath: Secondary | ICD-10-CM

## 2013-01-10 DIAGNOSIS — R0789 Other chest pain: Secondary | ICD-10-CM

## 2013-01-14 ENCOUNTER — Telehealth: Payer: Self-pay | Admitting: Family Medicine

## 2013-01-14 DIAGNOSIS — E7800 Pure hypercholesterolemia, unspecified: Secondary | ICD-10-CM

## 2013-01-14 NOTE — Telephone Encounter (Signed)
Message copied by Excell Seltzer on Mon Jan 14, 2013 10:46 PM ------      Message from: [name]      Created: Wed Jan 09, 2013  3:10 PM      Regarding: Lab orders for Tuesday, 8.26.14       Patient is scheduled for CPX labs, please order future labs, Thanks , Susan [name]             ------

## 2013-01-15 ENCOUNTER — Ambulatory Visit (INDEPENDENT_AMBULATORY_CARE_PROVIDER_SITE_OTHER): Payer: Medicare Other | Admitting: Internal Medicine

## 2013-01-15 ENCOUNTER — Other Ambulatory Visit (INDEPENDENT_AMBULATORY_CARE_PROVIDER_SITE_OTHER): Payer: Medicare Other

## 2013-01-15 ENCOUNTER — Encounter: Payer: Self-pay | Admitting: Internal Medicine

## 2013-01-15 VITALS — BP 104/68 | HR 81 | Temp 97.8°F | Wt 175.5 lb

## 2013-01-15 DIAGNOSIS — E7800 Pure hypercholesterolemia, unspecified: Secondary | ICD-10-CM

## 2013-01-15 DIAGNOSIS — M1711 Unilateral primary osteoarthritis, right knee: Secondary | ICD-10-CM

## 2013-01-15 DIAGNOSIS — G8929 Other chronic pain: Secondary | ICD-10-CM

## 2013-01-15 DIAGNOSIS — M25569 Pain in unspecified knee: Secondary | ICD-10-CM

## 2013-01-15 DIAGNOSIS — M1710 Unilateral primary osteoarthritis, unspecified knee: Secondary | ICD-10-CM

## 2013-01-15 LAB — LIPID PANEL
Cholesterol: 178 mg/dL (ref 0–200)
HDL: 49.7 mg/dL (ref >39.00)
LDL Cholesterol: 112 mg/dL — ABNORMAL HIGH (ref 0–99)
VLDL: 16.4 mg/dL (ref 0.0–40.0)

## 2013-01-15 LAB — COMPREHENSIVE METABOLIC PANEL
Alkaline Phosphatase: 71 U/L (ref 39–117)
Creatinine, Ser: 0.6 mg/dL (ref 0.4–1.2)
Glucose, Bld: 93 mg/dL (ref 70–99)
Sodium: 141 mEq/L (ref 135–145)
Total Bilirubin: 0.4 mg/dL (ref 0.3–1.2)
Total Protein: 7.3 g/dL (ref 6.0–8.3)

## 2013-01-15 MED ORDER — METHYLPREDNISOLONE ACETATE 40 MG/ML IJ SUSP
80.0000 mg | Freq: Once | INTRAMUSCULAR | Status: AC
  Administered 2013-01-15: 80 mg via INTRAMUSCULAR

## 2013-01-15 NOTE — Assessment & Plan Note (Signed)
Will give 80 mg Depo IM today Continue Biofreeze topically Continue to follow with [facility] If you would like, make an appointment with Dr. Patsy Lager for a joint injection in the right knee

## 2013-01-15 NOTE — Progress Notes (Signed)
Subjective:    Patient ID: Susan [name], female    DOB: 1940/11/24, 72 y.o.   MRN: [id number]  HPI  Pt presents to the clinic today with c/o right knee pain. This is a chronic issue for her. She was told by [facility] that she has no cartilage left in that knee and she needs a total knee replacement. She is not ready for that yet. It does hurt more when she walks on it. It is aggravated by walking up stairs. She does put Biofreeze on it which does help alleviate the pain. She would like a joint injection today.   Review of Systems.  Past Medical History  Diagnosis Date  . Diverticulosis of colon (without mention of hemorrhage)   . 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 Acute peptic ulcer, unspecified site, with hemorrhage and perforation, without mention of obstruction   . Chronic airway obstruction, not elsewhere classified   . Ventral hernia   . Hematochezia     Current Outpatient Prescriptions  Medication Sig Dispense Refill  . ibuprofen (ADVIL,MOTRIN) 800 MG tablet Take 800 mg by mouth every 8 (eight) hours as needed for pain.      Marland Kitchen lidocaine (LIDODERM) 5 % Place 1 patch onto the skin daily. Remove & Discard patch within 12 hours or as directed by MD      . rOPINIRole (REQUIP) 3 MG tablet Take 3 mg by mouth at bedtime.      . traZODone (DESYREL) 50 MG tablet Take 1.5 tablets (75 mg total) by mouth at bedtime as needed for sleep.  30 tablet  0  . valACYclovir (VALTREX) 500 MG tablet Take 500 mg by mouth 2 (two) times daily as needed.      Marland Kitchen albuterol (PROVENTIL HFA;VENTOLIN HFA) 108 (90 BASE) MCG/ACT inhaler Inhale 2 puffs into the lungs every 6 (six) hours as needed for wheezing.  1 Inhaler  2   No current facility-administered medications for this visit.    No Known Allergies  Family History  Problem Relation Age of Onset  . Diabetes Mother   . Coronary artery disease Mother   . Lung disease Mother   . Heart disease Mother     CAD  . Coronary  artery disease Father   . Kidney disease Father   . Diabetes Father   . Heart disease Father     CAD  . Cancer Neg Hx     History   Social History  . Marital Status: Married    Spouse Name: N/A    Number of Children: 3  . Years of Education: N/A   Occupational History  . self employeed     Owns beauty shop   Social History Main Topics  . Smoking status: Former Smoker -- 40 years    Types: Cigarettes    Quit date: 08/29/2000  . Smokeless tobacco: Never Used  . Alcohol Use: Yes     Comment: rare  . Drug Use: No  . Sexual Activity: Not on file   Other Topics Concern  . Not on file   Social History Narrative   Diet: Moderate, low fat, skips meals, some fruit and veggies   Regular exercise:  No   HCPOA is [place] and [place] [place],  Has living will, Not sure of code status (reviewed 2013)     Constitutional: Denies fever, malaise, fatigue, headache or abrupt weight changes.  Musculoskeletal: Pt reports right knee pain. Denies decrease in range of motion,  muscle pain.  Neurological: Denies dizziness, difficulty with memory, difficulty with speech or problems with balance and coordination.   No other specific complaints in a complete review of systems (except as listed in HPI above).     Objective:   Physical Exam   BP 104/68  Pulse 81  Temp(Src) 97.8 F (36.6 C) (Oral)  Wt 175 lb 8 oz (79.606 kg)  BMI 34.27 kg/m2  SpO2 96% Wt Readings from Last 3 Encounters:  01/15/13 175 lb 8 oz (79.606 kg)  01/04/13 177 lb (80.287 kg)  12/07/12 175 lb (79.379 kg)    General: Appears her stated age, chronically ill appearing in NAD. Cardiovascular: Normal rate and rhythm. S1,S2 noted.  No murmur, rubs or gallops noted. No JVD or BLE edema. No carotid bruits noted. Pulmonary/Chest: Normal effort and positive vesicular breath sounds. No respiratory distress. No wheezes, rales or ronchi noted.  Musculoskeletal: Ataxic gait. Pain with palpation of the lateral joint line.  Crepitus noted with ROM. Negative anterior/posterior drawer test. Neurological: Alert and oriented. Cranial nerves II-XII intact. Coordination normal. +DTRs bilaterally.   BMET    Component Value Date/Time   NA 140 10/11/2012 1355   K 3.7 10/11/2012 1355   CL 107 10/11/2012 1355   CO2 24 10/11/2012 1355   GLUCOSE 96 10/11/2012 1355   BUN 18 10/11/2012 1355   CREATININE 0.7 10/11/2012 1355   CALCIUM 9.0 10/11/2012 1355   GFRNONAA 75.73 03/02/2010 0925   GFRAA 92 06/30/2008 1109    Lipid Panel     Component Value Date/Time   CHOL 154 09/19/2011 1002   TRIG 86.0 09/19/2011 1002   HDL 47.40 09/19/2011 1002   CHOLHDL 3 09/19/2011 1002   VLDL 17.2 09/19/2011 1002   LDLCALC 89 09/19/2011 1002    CBC    Component Value Date/Time   WBC 8.2 10/11/2012 1355   RBC 4.55 10/11/2012 1355   HGB 13.7 10/11/2012 1355   HCT 40.4 10/11/2012 1355   PLT 212.0 10/11/2012 1355   MCV 88.7 10/11/2012 1355   MCHC 34.0 10/11/2012 1355   RDW 15.2* 10/11/2012 1355   LYMPHSABS 1.6 10/11/2012 1355   MONOABS 0.5 10/11/2012 1355   EOSABS 0.1 10/11/2012 1355   BASOSABS 0.0 10/11/2012 1355    Hgb A1C No results found for this basename: HGBA1C         Assessment & Plan:

## 2013-01-15 NOTE — Addendum Note (Signed)
Addended by: Criselda Peaches B on: 01/15/2013 09:57 AM   Modules accepted: Orders

## 2013-01-15 NOTE — Patient Instructions (Signed)
Knee Exercises EXERCISES RANGE OF MOTION(ROM) AND STRETCHING EXERCISES These exercises may help you when beginning to rehabilitate your injury. Your symptoms may resolve with or without further involvement from your physician, physical therapist or athletic trainer. While completing these exercises, remember:   Restoring tissue flexibility helps normal motion to return to the joints. This allows healthier, less painful movement and activity.  An effective stretch should be held for at least 30 seconds.  A stretch should never be painful. You should only feel a gentle lengthening or release in the stretched tissue. STRETCH - Knee Extension, Prone  Lie on your stomach on a firm surface, such as a bed or countertop. Place your right / left knee and leg just beyond the edge of the surface. You may wish to place a towel under the far end of your right / left thigh for comfort.  Relax your leg muscles and allow gravity to straighten your knee. Your clinician may advise you to add an ankle weight if more resistance is helpful for you.  You should feel a stretch in the back of your right / left knee. Hold this position for __________ seconds. Repeat __________ times. Complete this stretch __________ times per day. * Your physician, physical therapist or athletic trainer may ask you to add ankle weight to enhance your stretch.  RANGE OF MOTION - Knee Flexion, Active  Lie on your back with both knees straight. (If this causes back discomfort, bend your opposite knee, placing your foot flat on the floor.)  Slowly slide your heel back toward your buttocks until you feel a gentle stretch in the front of your knee or thigh.  Hold for __________ seconds. Slowly slide your heel back to the starting position. Repeat __________ times. Complete this exercise __________ times per day.  STRETCH - Quadriceps, Prone   Lie on your stomach on a firm surface, such as a bed or padded floor.  Bend your right /  left knee and grasp your ankle. If you are unable to reach, your ankle or pant leg, use a belt around your foot to lengthen your reach.  Gently pull your heel toward your buttocks. Your knee should not slide out to the side. You should feel a stretch in the front of your thigh and/or knee.  Hold this position for __________ seconds. Repeat __________ times. Complete this stretch __________ times per day.  STRETCH  Hamstrings, Supine   Lie on your back. Loop a belt or towel over the ball of your right / left foot.  Straighten your right / left knee and slowly pull on the belt to raise your leg. Do not allow the right / left knee to bend. Keep your opposite leg flat on the floor.  Raise the leg until you feel a gentle stretch behind your right / left knee or thigh. Hold this position for __________ seconds. Repeat __________ times. Complete this stretch __________ times per day.  STRENGTHENING EXERCISES These exercises may help you when beginning to rehabilitate your injury. They may resolve your symptoms with or without further involvement from your physician, physical therapist or athletic trainer. While completing these exercises, remember:   Muscles can gain both the endurance and the strength needed for everyday activities through controlled exercises.  Complete these exercises as instructed by your physician, physical therapist or athletic trainer. Progress the resistance and repetitions only as guided.  You may experience muscle soreness or fatigue, but the pain or discomfort you are trying to eliminate should   never worsen during these exercises. If this pain does worsen, stop and make certain you are following the directions exactly. If the pain is still present after adjustments, discontinue the exercise until you can discuss the trouble with your clinician. STRENGTH - Quadriceps, Isometrics  Lie on your back with your right / left leg extended and your opposite knee bent.  Gradually  tense the muscles in the front of your right / left thigh. You should see either your knee cap slide up toward your hip or increased dimpling just above the knee. This motion will push the back of the knee down toward the floor/mat/bed on which you are lying.  Hold the muscle as tight as you can without increasing your pain for __________ seconds.  Relax the muscles slowly and completely in between each repetition. Repeat __________ times. Complete this exercise __________ times per day.  STRENGTH - Quadriceps, Short Arcs   Lie on your back. Place a __________ inch towel roll under your knee so that the knee slightly bends.  Raise only your lower leg by tightening the muscles in the front of your thigh. Do not allow your thigh to rise.  Hold this position for __________ seconds. Repeat __________ times. Complete this exercise __________ times per day.  OPTIONAL ANKLE WEIGHTS: Begin with ____________________, but DO NOT exceed ____________________. Increase in 1 pound/0.5 kilogram increments.  STRENGTH - Quadriceps, Straight Leg Raises  Quality counts! Watch for signs that the quadriceps muscle is working to insure you are strengthening the correct muscles and not "cheating" by substituting with healthier muscles.  Lay on your back with your right / left leg extended and your opposite knee bent.  Tense the muscles in the front of your right / left thigh. You should see either your knee cap slide up or increased dimpling just above the knee. Your thigh may even quiver.  Tighten these muscles even more and raise your leg 4 to 6 inches off the floor. Hold for __________ seconds.  Keeping these muscles tense, lower your leg.  Relax the muscles slowly and completely in between each repetition. Repeat __________ times. Complete this exercise __________ times per day.  STRENGTH - Hamstring, Curls  Lay on your stomach with your legs extended. (If you lay on a bed, your feet may hang over the  edge.)  Tighten the muscles in the back of your thigh to bend your right / left knee up to 90 degrees. Keep your hips flat on the bed/floor.  Hold this position for __________ seconds.  Slowly lower your leg back to the starting position. Repeat __________ times. Complete this exercise __________ times per day.  OPTIONAL ANKLE WEIGHTS: Begin with ____________________, but DO NOT exceed ____________________. Increase in 1 pound/0.5 kilogram increments.  STRENGTH  Quadriceps, Squats  Stand in a door frame so that your feet and knees are in line with the frame.  Use your hands for balance, not support, on the frame.  Slowly lower your weight, bending at the hips and knees. Keep your lower legs upright so that they are parallel with the door frame. Squat only within the range that does not increase your knee pain. Never let your hips drop below your knees.  Slowly return upright, pushing with your legs, not pulling with your hands. Repeat __________ times. Complete this exercise __________ times per day.  STRENGTH - Quadriceps, Wall Slides  Follow guidelines for form closely. Increased knee pain often results from poorly placed feet or knees.  Lean against   a smooth wall or door and walk your feet out 18-24 inches. Place your feet hip-width apart.  Slowly slide down the wall or door until your knees bend __________ degrees.* Keep your knees over your heels, not your toes, and in line with your hips, not falling to either side.  Hold for __________ seconds. Stand up to rest for __________ seconds in between each repetition. Repeat __________ times. Complete this exercise __________ times per day. * Your physician, physical therapist or athletic trainer will alter this angle based on your symptoms and progress. Document Released: 03/23/2005 Document Revised: 08/01/2011 Document Reviewed: 08/21/2008 [employer] Patient Information 2014 [employer].  

## 2013-01-16 ENCOUNTER — Telehealth: Payer: Self-pay

## 2013-01-16 NOTE — Telephone Encounter (Signed)
Preliminary was reviewed with pt.

## 2013-01-16 NOTE — Telephone Encounter (Signed)
Pt would like stress test results.  

## 2013-01-22 ENCOUNTER — Ambulatory Visit (INDEPENDENT_AMBULATORY_CARE_PROVIDER_SITE_OTHER): Payer: Medicare Other | Admitting: Family Medicine

## 2013-01-22 ENCOUNTER — Encounter: Payer: Self-pay | Admitting: Family Medicine

## 2013-01-22 VITALS — BP 120/84 | HR 78 | Temp 97.7°F | Ht 60.35 in | Wt 172.0 lb

## 2013-01-22 DIAGNOSIS — E7800 Pure hypercholesterolemia, unspecified: Secondary | ICD-10-CM

## 2013-01-22 DIAGNOSIS — Z23 Encounter for immunization: Secondary | ICD-10-CM

## 2013-01-22 DIAGNOSIS — R0989 Other specified symptoms and signs involving the circulatory and respiratory systems: Secondary | ICD-10-CM

## 2013-01-22 DIAGNOSIS — Z0000 Encounter for general adult medical examination without abnormal findings: Secondary | ICD-10-CM

## 2013-01-22 DIAGNOSIS — R0609 Other forms of dyspnea: Secondary | ICD-10-CM

## 2013-01-22 DIAGNOSIS — R0600 Dyspnea, unspecified: Secondary | ICD-10-CM

## 2013-01-22 NOTE — Patient Instructions (Addendum)
Stop at front desk to set up referral for shortness of breath with exertion. Call Dr. Jarold Motto for repeat colonoscopy in next 6 months.  Exercise as able... walking etc.

## 2013-01-22 NOTE — Assessment & Plan Note (Signed)
Cardiac eval negative, nml CXR and nml PFTs.... Will refer to pulm for further eval.

## 2013-01-22 NOTE — Progress Notes (Signed)
I have personally reviewed the Medicare Annual Wellness questionnaire and have noted  1. The patient's medical and social history  2. Their use of alcohol, tobacco or illicit drugs  3. Their current medications and supplements  4. The patient's functional ability including ADL's, fall risks, home safety risks and hearing or visual  impairment.  5. Diet and physical activities  6. Evidence for depression or mood disorders  The patients weight, height, BMI and visual acuity have been recorded in the chart  I have made referrals, counseling and provided education to the patient based review of the above and I have provided the pt with a written personalized care plan for preventive services.   DOE: neg cardiac work up, nml PFTs. Pt wishes to see pulmonologist.  Albuterol has not helped at all.  Elevated Cholesterol: Well controlled at goal LDL <130.  Lab Results  Component Value Date   CHOL 178 01/15/2013   HDL 49.70 01/15/2013   LDLCALC 112* 01/15/2013   LDLDIRECT 146.6 10/20/2010   TRIG 82.0 01/15/2013   CHOLHDL 4 01/15/2013  Diet compliance:  Healthy diet. Exercise:  Limited Other complaints:   Has had B hip revisions surgery 05/2011, 4/ 2013 (Dr. Autumn Messing) and left foot surgery  (Dr. Dareen Piano) 11/2011   Review of Systems  Constitutional: Negative for fever, fatigue and unexpected weight change.  HENT: Negative for ear pain, congestion, sore throat, sneezing, trouble swallowing and sinus pressure.  Eyes: Negative for pain and itching.  Respiratory: Negative for cough, stable shortness of breath  With exertion  and wheezing.  Cardiovascular: Negative for chest pain, palpitations and leg swelling.  Gastrointestinal: Negative for nausea, abdominal pain, diarrhea, constipation and blood in stool.  Genitourinary: Negative for frequency. Negative for dysuria, hematuria, vaginal discharge, difficulty urinating and menstrual problem.  Skin: Negative for rash.  Neurological: Negative for syncope,  weakness, light-headedness, numbness and headaches.  Psychiatric/Behavioral: Negative for confusion and dysphoric mood. The patient is not nervous/anxious.   Objective:   Physical Exam  Constitutional: Vital signs are normal. She appears well-developed and well-nourished. She is cooperative. Non-toxic appearance. She does not appear ill. No distress.  HENT:  Head: Normocephalic.  Right Ear: Hearing, tympanic membrane, external ear and ear canal normal.  Left Ear: Hearing, tympanic membrane, external ear and ear canal normal.  Nose: Nose normal.  Eyes: Conjunctivae, EOM and lids are normal. Pupils are equal, round, and reactive to light. No foreign bodies found.  Neck: Trachea normal and normal range of motion. Neck supple. Carotid bruit is not present. No mass and no thyromegaly present.  Cardiovascular: Normal rate, regular rhythm, S1 normal, S2 normal, normal heart sounds and intact distal pulses. Exam reveals no gallop.  No murmur heard.  Pulmonary/Chest: Effort normal and breath sounds normal. No respiratory distress. She has no wheezes. She has no rhonchi. She has no rales.  Abdominal: Soft. Normal appearance and bowel sounds are normal. She exhibits no distension, no fluid wave, no abdominal bruit and no mass. There is no hepatosplenomegaly. There is no tenderness. There is no rebound, no guarding and no CVA tenderness. No hernia.  Musculoskeletal:  Lymphadenopathy:  She has no cervical adenopathy.  She has no axillary adenopathy.  Neurological: She is alert. She has normal strength. No cranial nerve deficit or sensory deficit.  Skin: Skin is warm, dry and intact. No rash noted.  Psychiatric: Her speech is normal and behavior is normal. Judgment normal. Her mood appears not anxious. Cognition and memory are normal. She does not  exhibit a depressed mood.  Assessment & Plan:   The patient's preventative maintenance and recommended screening tests for an annual wellness exam were reviewed  in full today.  Brought up to date unless services declined.  Counselled on the importance of diet, exercise, and its role in overall health and mortality.  The patient's FH and SH was reviewed, including their home life, tobacco status, and drug and alcohol status.   Vaccines:Uptodate with all.  Flu was given. DEXA: last in 12/2011 Colon: Dr. Jarold Motto. Procedure date: 10/29/2007  Next Due Date Colonoscopy: 10/2012 ... DUE Mammo: 01/04/2013 nml DVE/pap:not indicated due to age. No family history of uterine or ovarian cancer.

## 2013-01-22 NOTE — Assessment & Plan Note (Signed)
Well controlled with diet Encouraged exercise, weight loss, healthy eating habits.   

## 2013-01-23 ENCOUNTER — Ambulatory Visit (INDEPENDENT_AMBULATORY_CARE_PROVIDER_SITE_OTHER): Payer: Medicare Other | Admitting: Family Medicine

## 2013-01-25 ENCOUNTER — Telehealth: Payer: Self-pay | Admitting: *Deleted

## 2013-01-25 NOTE — Telephone Encounter (Signed)
Susan [name] left voicemail requesting Dr. Ermalene Searing to order a "sleep apnea test" for her.  States she forgot to mention this to her at her physical appointment this week.  Will forward to Dr. Ermalene Searing for review.

## 2013-01-25 NOTE — Telephone Encounter (Signed)
Notified Ms. Homewood to discuss this with her pulmonologist at her visit she has scheduled later on this month.  Patient is in agreement with that plan.

## 2013-01-25 NOTE — Telephone Encounter (Signed)
Let pt lknow we typical do this through pulmonary... She already has pulm referral so they can set it up there.

## 2013-01-26 ENCOUNTER — Other Ambulatory Visit: Payer: Self-pay | Admitting: Family Medicine

## 2013-01-27 NOTE — Telephone Encounter (Signed)
Last office visit 01/22/2013.  Ok to refill?

## 2013-01-28 ENCOUNTER — Ambulatory Visit (INDEPENDENT_AMBULATORY_CARE_PROVIDER_SITE_OTHER): Payer: Medicare Other | Admitting: Family Medicine

## 2013-01-28 ENCOUNTER — Encounter: Payer: Self-pay | Admitting: Family Medicine

## 2013-01-28 VITALS — BP 140/86 | HR 59 | Temp 97.8°F | Ht 60.35 in | Wt 176.2 lb

## 2013-01-28 DIAGNOSIS — M19019 Primary osteoarthritis, unspecified shoulder: Secondary | ICD-10-CM

## 2013-01-28 DIAGNOSIS — IMO0002 Reserved for concepts with insufficient information to code with codable children: Secondary | ICD-10-CM

## 2013-01-28 DIAGNOSIS — M1710 Unilateral primary osteoarthritis, unspecified knee: Secondary | ICD-10-CM

## 2013-01-28 DIAGNOSIS — M19011 Primary osteoarthritis, right shoulder: Secondary | ICD-10-CM

## 2013-01-28 DIAGNOSIS — M1711 Unilateral primary osteoarthritis, right knee: Secondary | ICD-10-CM

## 2013-01-28 NOTE — Progress Notes (Signed)
Nature conservation officer at [facility] [address] [place] [address] Phone: [phone] Fax: [phone]  Date:  01/28/2013   Name:  Susan [name]   DOB:  June 21, 1940   MRN:  [id number] Gender: female Age: 72 y.o.  Primary Physician:  Kerby Nora, MD  Evaluating MD: Hannah Beat, MD   Chief Complaint: Shoulder Pain and Knee Pain   History of Present Illness:  Susan [name] is a 72 y.o. pleasant patient who presents with the following:  R shoulder: End stage GH arthritis. MRI of R shoulder 10/2012 reviewed by myself. Clearly with end-stage OA and chronic retracted rotator cuff tears. She has already spoken to Dr. Andrey Campanile at [facility] about a reverse total shoulder. She also asks my opinion.  R knee: also with known significant knee OA, gets some relief for 1-2 months from steroids.      Patient Active Problem List   Diagnosis Date Noted  . Chronic knee pain 01/15/2013  . Obstructive sleep apnea 10/11/2012  . Umbilical hernia 04/01/2012  . Urinary incontinence, urge 01/17/2012  . Diastolic dysfunction 04/21/2011  . Preoperative cardiovascular examination 04/21/2011  . DDD (degenerative disc disease) 10/26/2010  . RESTLESS LEG SYNDROME 02/23/2010  . Dyspnea on exertion 02/23/2010  . HIP REPLACEMENT, LEFT, HX OF 12/21/2009  . FOOT PAIN, BILATERAL 03/25/2009  . HYPERCHOLESTEROLEMIA 06/18/2008  . ALLERGIC RHINITIS 05/21/2008  . ANKLE PAIN, BILATERAL 05/21/2008  . DIVERTICULOSIS, COLON 11/26/2007  . HERPES SIMPLEX INFECTION, RECURRENT 10/17/2007    Past Medical History  Diagnosis Date  . Diverticulosis of colon (without mention of hemorrhage)   . 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 Acute peptic ulcer, unspecified site, with hemorrhage and perforation, without mention of obstruction   . Chronic airway obstruction, not elsewhere classified   . Ventral hernia   . Hematochezia     Past Surgical History  Procedure Laterality Date  .  Bunionectomy    . Total hip arthroplasty  2007    left  . Appendectomy    . Tubal ligation    . Cosmetic surgery  1970's    abdomen. Tummy Tuck  . Foot surgery      left foot   . Total hip arthroplasty      bilateral     History   Social History  . Marital Status: Married    Spouse Name: N/A    Number of Children: 3  . Years of Education: N/A   Occupational History  . self employeed     Owns beauty shop   Social History Main Topics  . Smoking status: Former Smoker -- 40 years    Types: Cigarettes    Quit date: 08/29/2000  . Smokeless tobacco: Never Used  . Alcohol Use: Yes     Comment: rare  . Drug Use: No  . Sexual Activity: Not on file   Other Topics Concern  . Not on file   Social History Narrative   Diet: Moderate, low fat, skips meals, some fruit and veggies   Regular exercise:  No   HCPOA is [facility]il Abbe and [place] [employer],  Has living will, Not sure of code status (reviewed 2013)    Family History  Problem Relation Age of Onset  . Diabetes Mother   . Coronary artery disease Mother   . Lung disease Mother   . Heart disease Mother     CAD  . Coronary artery disease Father   . Kidney disease Father   .  Diabetes Father   . Heart disease Father     CAD  . Cancer Neg Hx     No Known Allergies  Medication list has been reviewed and updated.  Outpatient Prescriptions Prior to Visit  Medication Sig Dispense Refill  . albuterol (PROVENTIL HFA;VENTOLIN HFA) 108 (90 BASE) MCG/ACT inhaler Inhale 2 puffs into the lungs every 6 (six) hours as needed for wheezing.  1 Inhaler  2  . ibuprofen (ADVIL,MOTRIN) 800 MG tablet Take 800 mg by mouth every 8 (eight) hours as needed for pain.      Marland Kitchen lidocaine (LIDODERM) 5 % Place 1 patch onto the skin daily. Remove & Discard patch within 12 hours or as directed by MD      . rOPINIRole (REQUIP) 3 MG tablet Take 3 mg by mouth at bedtime.      . traZODone (DESYREL) 50 MG tablet TAKE 1 & 1/2 TABLETS BY MOUTH AT BEDTIMEAS  NEEDED FOR SLEEP  45 tablet  1  . valACYclovir (VALTREX) 500 MG tablet Take 500 mg by mouth 2 (two) times daily as needed.       No facility-administered medications prior to visit.    Review of Systems:   GEN: No fevers, chills. Nontoxic. Primarily MSK c/o today. MSK: Detailed in the HPI GI: tolerating PO intake without difficulty Neuro: No numbness, parasthesias, or tingling associated. Otherwise the pertinent positives of the ROS are noted above.    Physical Examination: BP 140/86  Pulse 59  Temp(Src) 97.8 F (36.6 C) (Oral)  Ht 5' 0.35" (1.533 m)  Wt 176 lb 4 oz (79.946 kg)  BMI 34.02 kg/m2  Ideal Body Weight: Weight in (lb) to have BMI = 25: 129.2   GEN: WDWN, NAD, Non-toxic, Alert & Oriented x 3 HEENT: Atraumatic, Normocephalic.  Ears and Nose: No external deformity. EXTR: No clubbing/cyanosis/edema PSYCH: Normally interactive. Conversant. Not depressed or anxious appearing.  Calm demeanor.   Shoulder: R Inspection: No muscle wasting or winging Ecchymosis/edema: neg  AC joint, scapula, clavicle: NT Cervical spine: NT, full ROM Spurling's: neg Abduction: to 160, 4++/5 Flexion: full, 5/5 IR, full, lift-off: 5/5 ER at neutral: full, 5/5 AC crossover and compression: pos Neer: pos Hawkins: pos Drop Test: neg Empty Can: pos Supraspinatus insertion: NT Bicipital groove: NT Speed's: neg Yergason's: neg Sulcus sign: neg Loud crepitus with motion C5-T1 intact Sensation intact Grip 5/5  R knee, mild effusion. Crepitus with motion. Stable ACL, PCL, MCL, LCL.  Assessment and Plan: Glenohumeral arthritis, right  Osteoarthritis of right knee   Discussed total shoulder and reverse total shoulder. She wants another opinion, and I suggested Dr. Ave Filter. If she feels the needs to go to one of the teaching hospitals, I suggested Dr. Joanette Gula at [facility].  Intrarticular Shoulder Injection, R Verbal consent was obtained from the patient. Risks including infection  explained and contrasted with benefits and alternatives. Patient prepped with Chloraprep and Ethyl Chloride used for anesthesia. An intraarticular shoulder injection was performed using the posterior approach. The patient tolerated the procedure well and had decreased pain post injection. No complications. Injection: 8 cc of Lidocaine 1% and 2 cc of Depo-Medrol 40 mg. Needle: 22 gauge  Knee Injection, R Patient verbally consented to procedure. Risks (including potential rare risk of infection), benefits, and alternatives explained. Sterilely prepped with Chloraprep. Ethyl cholride used for anesthesia. 8 cc Lidocaine 1% mixed with 2 cc of Depo-Medrol 40 mg injected using the anterolateral approach without difficulty. No complications with procedure and tolerated well. Patient had  decreased pain post-injection.   Signed, Elpidio Galea. Suhan Paci, MD 01/28/2013 10:52 AM

## 2013-01-28 NOTE — Patient Instructions (Addendum)
Dr. Jackquline Bosch, [facility]. Shoulder surgeon. Dr. Wendee Copp, [facility]. Shoulder surgeon.

## 2013-02-11 ENCOUNTER — Other Ambulatory Visit: Payer: Self-pay | Admitting: *Deleted

## 2013-02-11 MED ORDER — ROPINIROLE HCL 3 MG PO TABS: 3.0000 mg | ORAL_TABLET | Freq: Every day | ORAL | Status: DC

## 2013-02-12 ENCOUNTER — Ambulatory Visit (INDEPENDENT_AMBULATORY_CARE_PROVIDER_SITE_OTHER): Payer: Medicare Other | Admitting: Pulmonary Disease

## 2013-02-12 ENCOUNTER — Encounter: Payer: Self-pay | Admitting: Pulmonary Disease

## 2013-02-12 VITALS — BP 142/76 | HR 75 | Temp 98.6°F | Ht 60.0 in | Wt 172.4 lb

## 2013-02-12 DIAGNOSIS — G4733 Obstructive sleep apnea (adult) (pediatric): Secondary | ICD-10-CM

## 2013-02-12 DIAGNOSIS — R0602 Shortness of breath: Secondary | ICD-10-CM

## 2013-02-12 NOTE — Patient Instructions (Addendum)
Take the spiriva sample every day no matter how you feel If you feel that the spiriva helps let us know and we will send in an Rx We will call you with the results of the CT scan  We will order a sleep study in your home  We will see you back in 3-4 weeks or sooner if needed

## 2013-02-12 NOTE — Assessment & Plan Note (Signed)
So far Susan [name] has had a very good workup to look for the most common causes of shortness of breath. These have included a chest x-ray, full pulmonary function testing, echocardiogram, and a stress test. All of this has been normal as have a hemoglobin and hematocrit level checked in May 2014.  However, her strong smoking history and wheezing on physical exam and what I believe is hyperinflation on her chest x-ray is suggestive of emphysema. Further when you look at her pulmonary function testing her DLCO was markedly abnormal in the setting of a normal hemoglobin. This strongly suggests a diffusion abnormality of some sort. This can be a pulmonary vascular problem (less likely considering the normal echo) or emphysema which was not appreciated on her chest x-ray.  Plan: -We will order a chest CT to look for emphysema -I gave her a sample of Spiriva to try today for emphysema, she will call us if she feels that it is helping -Followup with me in 3-4 weeks.

## 2013-02-12 NOTE — Assessment & Plan Note (Signed)
She has symptoms of obstructive sleep apnea and that she is fatigued most days and feels like she does not sleep well. Her sleep hygiene is terrible, but it is reasonable to have her tested for obstructive sleep apnea considering her fatigue.  Plan: -Home sleep study -Educated her to turn off the television and all lights at night -Stop caffeine

## 2013-02-12 NOTE — Progress Notes (Signed)
Subjective:    Patient ID: Susan [name], female    DOB: [birth year], 72 y.o.   MRN: [id number]  HPI  This is a very pleasant 72 year old female who comes to our clinic today for evaluation of shortness of breath. She stated that she had a normal childhood without respiratory illnesses but unfortunately started smoking cigarettes as a young adult. She says that she smoked one half pack of cigarettes daily for 40 years and quit 12 years ago. She's not sure exactly how long ago the shortness of breath started but she thinks it may have been 3 years or so ago. It is definitely been worse in the last year. It is only with exertion though sometimes the exertion has to be minimal an order for her to have shortness of breath. She recently went to a wedding where she had to walk what sounds like several 100 yards in a field and felt like she was going to black out because of shortness of breath. She has been given albuterol recently and she thinks that it may have helped. She does notice wheezing when she is short of breath. She has not had frank chest pain. She previously had bouts of bronchitis on an annual basis up until about 5 years ago but at that point she received a pneumonia shot and she says that she has not had any episodes since then. She recently had a stress test performed by Dr. Mariah Milling and this was normal. She also had an echocardiogram which was normal. She also describes daytime somnolence and feels like she is going to have to take a nap every day at 3:00. She does not often. She does not sleep well at night. She says that sometimes she has a hard time falling asleep at other times she'll wake up around 4 or 5:00 in the morning and cannot go back to sleep. She sleeps with the television on the room. She drinks caffeine throughout the day up until about 4 PM. There are no triggers such as chemicals, dust, or fumes which will make her more short of breath. She currently works as a  Interior and spatial designer.  Past Medical History  Diagnosis Date  . Diverticulosis of colon (without mention of hemorrhage)   . 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 Acute peptic ulcer, unspecified site, with hemorrhage and perforation, without mention of obstruction   . Chronic airway obstruction, not elsewhere classified   . Ventral hernia   . Hematochezia      Family History  Problem Relation Age of Onset  . Diabetes Mother   . Coronary artery disease Mother   . Lung disease Mother   . Heart disease Mother     CAD  . Coronary artery disease Father   . Kidney disease Father   . Diabetes Father   . Heart disease Father     CAD  . Cancer Neg Hx      History   Social History  . Marital Status: Married    Spouse Name: N/A    Number of Children: 3  . Years of Education: N/A   Occupational History  . self employeed     Owns beauty shop   Social History Main Topics  . Smoking status: Former Smoker -- 0.50 packs/day for 40 years    Types: Cigarettes    Quit date: 08/29/2000  . Smokeless tobacco: Never Used  . Alcohol Use: Yes     Comment: rare  . Drug Use: No  .  Sexual Activity: Not on file   Other Topics Concern  . Not on file   Social History Narrative   Diet: Moderate, low fat, skips meals, some fruit and veggies   Regular exercise:  No   HCPOA is [facility]il Abbe and [employer] [employer],  Has living will, Not sure of code status (reviewed 2013)     No Known Allergies   Outpatient Prescriptions Prior to Visit  Medication Sig Dispense Refill  . albuterol (PROVENTIL HFA;VENTOLIN HFA) 108 (90 BASE) MCG/ACT inhaler Inhale 2 puffs into the lungs every 6 (six) hours as needed for wheezing.  1 Inhaler  2  . ibuprofen (ADVIL,MOTRIN) 800 MG tablet Take 800 mg by mouth every 8 (eight) hours as needed for pain.      Marland Kitchen lidocaine (LIDODERM) 5 % Place 1 patch onto the skin daily. Remove & Discard patch within 12 hours or as directed by MD      . rOPINIRole (REQUIP) 3 MG tablet  Take 1 tablet (3 mg total) by mouth at bedtime.  30 tablet  5  . traZODone (DESYREL) 50 MG tablet TAKE 1 & 1/2 TABLETS BY MOUTH AT BEDTIMEAS NEEDED FOR SLEEP  45 tablet  1  . valACYclovir (VALTREX) 500 MG tablet Take 500 mg by mouth 2 (two) times daily as needed.       No facility-administered medications prior to visit.      Review of Systems  Constitutional: Negative for fever, chills and unexpected weight change.  HENT: Negative for ear pain, nosebleeds, congestion, sore throat, rhinorrhea, sneezing, trouble swallowing, dental problem, voice change, postnasal drip and sinus pressure.   Eyes: Negative for visual disturbance.  Respiratory: Positive for shortness of breath. Negative for cough and choking.   Cardiovascular: Negative for chest pain and leg swelling.  Gastrointestinal: Negative for vomiting, abdominal pain and diarrhea.  Genitourinary: Negative for difficulty urinating.  Musculoskeletal: Positive for arthralgias.  Skin: Negative for rash.  Neurological: Negative for tremors, syncope and headaches.  Hematological: Does not bruise/bleed easily.       Objective:   Physical Exam  Filed Vitals:   02/12/13 1015  BP: 142/76  Pulse: 75  Temp: 98.6 F (37 C)  TempSrc: Oral  Height: 5' (1.524 m)  Weight: 172 lb 6.4 oz (78.2 kg)  SpO2: 94%  ra  Gen: well appearing, no acute distress HEENT: NCAT, PERRL, EOMi, OP clear, neck supple without masses PULM: Few scattered expiratory wheezes, normal percussion CV: RRR, no mgr, no JVD AB: BS+, soft, nontender, no hsm Ext: warm, no edema, no clubbing, no cyanosis Derm: no rash or skin breakdown Neuro: A&Ox4, CN II-XII intact, strength 5/5 in all 4 extremities        Assessment & Plan:   Shortness of breath So far Mrs. Wilcoxen has had a very good workup to look for the most common causes of shortness of breath. These have included a chest x-ray, full pulmonary function testing, echocardiogram, and a stress test. All of  this has been normal as have a hemoglobin and hematocrit level checked in May 2014.  However, her strong smoking history and wheezing on physical exam and what I believe is hyperinflation on her chest x-ray is suggestive of emphysema. Further when you look at her pulmonary function testing her DLCO was markedly abnormal in the setting of a normal hemoglobin. This strongly suggests a diffusion abnormality of some sort. This can be a pulmonary vascular problem (less likely considering the normal echo) or emphysema which was not  appreciated on her chest x-ray.  Plan: -We will order a chest CT to look for emphysema -I gave her a sample of Spiriva to try today for emphysema, she will call us if she feels that it is helping -Followup with me in 3-4 weeks.  Obstructive sleep apnea She has symptoms of obstructive sleep apnea and that she is fatigued most days and feels like she does not sleep well. Her sleep hygiene is terrible, but it is reasonable to have her tested for obstructive sleep apnea considering her fatigue.  Plan: -Home sleep study -Educated her to turn off the television and all lights at night -Stop caffeine   Updated Medication List Outpatient Encounter Prescriptions as of 02/12/2013  Medication Sig Dispense Refill  . albuterol (PROVENTIL HFA;VENTOLIN HFA) 108 (90 BASE) MCG/ACT inhaler Inhale 2 puffs into the lungs every 6 (six) hours as needed for wheezing.  1 Inhaler  2  . ibuprofen (ADVIL,MOTRIN) 800 MG tablet Take 800 mg by mouth every 8 (eight) hours as needed for pain.      Marland Kitchen lidocaine (LIDODERM) 5 % Place 1 patch onto the skin daily. Remove & Discard patch within 12 hours or as directed by MD      . rOPINIRole (REQUIP) 3 MG tablet Take 1 tablet (3 mg total) by mouth at bedtime.  30 tablet  5  . traZODone (DESYREL) 50 MG tablet TAKE 1 & 1/2 TABLETS BY MOUTH AT BEDTIMEAS NEEDED FOR SLEEP  45 tablet  1  . valACYclovir (VALTREX) 500 MG tablet Take 500 mg by mouth 2 (two) times  daily as needed.       No facility-administered encounter medications on file as of 02/12/2013.

## 2013-02-18 ENCOUNTER — Ambulatory Visit (INDEPENDENT_AMBULATORY_CARE_PROVIDER_SITE_OTHER)
Admission: RE | Admit: 2013-02-18 | Discharge: 2013-02-18 | Disposition: A | Discharge status: Home / Self Care | Payer: Medicare Other | Source: Ambulatory Visit | Attending: Pulmonary Disease | Admitting: Pulmonary Disease

## 2013-02-18 DIAGNOSIS — R0602 Shortness of breath: Secondary | ICD-10-CM

## 2013-02-19 ENCOUNTER — Encounter: Payer: Self-pay | Admitting: Pulmonary Disease

## 2013-02-19 DIAGNOSIS — R911 Solitary pulmonary nodule: Secondary | ICD-10-CM | POA: Insufficient documentation

## 2013-02-20 ENCOUNTER — Other Ambulatory Visit: Payer: Self-pay | Admitting: Family Medicine

## 2013-02-20 NOTE — Telephone Encounter (Signed)
Last seen be Dr. Patsy Lager on 01/28/2013.  Refill?

## 2013-02-25 DIAGNOSIS — G4733 Obstructive sleep apnea (adult) (pediatric): Secondary | ICD-10-CM

## 2013-02-25 DIAGNOSIS — R5383 Other fatigue: Secondary | ICD-10-CM

## 2013-02-25 DIAGNOSIS — R5381 Other malaise: Secondary | ICD-10-CM

## 2013-03-01 DIAGNOSIS — G4733 Obstructive sleep apnea (adult) (pediatric): Secondary | ICD-10-CM

## 2013-03-04 ENCOUNTER — Encounter: Payer: Self-pay | Admitting: Pulmonary Disease

## 2013-03-04 ENCOUNTER — Telehealth: Payer: Self-pay | Admitting: *Deleted

## 2013-03-04 DIAGNOSIS — G4733 Obstructive sleep apnea (adult) (pediatric): Secondary | ICD-10-CM

## 2013-03-04 NOTE — Telephone Encounter (Signed)
Message copied by Caryl Ada on Mon Mar 04, 2013  9:20 AM ------      Message from: Max Fickle B      Created: Sun Mar 03, 2013  3:06 PM       L,            We will need to let her know that her sleep study showed severe sleep apnea and we will need to set up an Autot[employer]rating RESMED 9 machine set on 5-20cm h20 with 3 week download.            Thanks      B      ----- Message -----         From: Barbaraann Share, MD         Sent: 03/01/2013   9:01 AM           To: Lupita Leash, MD            Kipp Brood, this pt has severe sleep apnea on her home sleep test, with an AHI of 44/hr.  Should be scanned in computer next 48hrs.        ------

## 2013-03-04 NOTE — Telephone Encounter (Signed)
Pt is aware of sleep study results. Order will be placed for CPAP.

## 2013-03-05 ENCOUNTER — Ambulatory Visit: Payer: Medicare Other | Admitting: Pulmonary Disease

## 2013-03-05 ENCOUNTER — Encounter: Payer: Self-pay | Admitting: Pulmonary Disease

## 2013-03-11 ENCOUNTER — Telehealth: Payer: Self-pay | Admitting: Pulmonary Disease

## 2013-03-11 MED ORDER — TIOTROPIUM BROMIDE MONOHYDRATE 18 MCG IN CAPS: 18.0000 ug | ORAL_CAPSULE | Freq: Every day | RESPIRATORY_TRACT | Status: DC

## 2013-03-11 NOTE — Telephone Encounter (Signed)
Last ov 9.23.14 with BQ, upcoming appt scheduled for 11.3.14 with BQ  BQ is at [facility] tomorrow but no nurse is here to take samples Called spoke with patient, advised of the above.  Pt stated that she has 1 capsule left and has already called the [facility] office and was told to call this office as they did not have any samples. Pt stated that she may be able to come to the [place] office tomorrow morning before she starts work to pick up samples.  Pt stated that she is aware of our location.   2 samples left up front for patient to pick up tomorrow morning at her convenience and documented per protocol. Nothing further needed at this time; will sign off.

## 2013-03-13 ENCOUNTER — Telehealth: Payer: Self-pay | Admitting: Pulmonary Disease

## 2013-03-13 NOTE — Telephone Encounter (Signed)
Spoke with pt and she said she has it figured out now.  Nothing further needed.

## 2013-03-20 ENCOUNTER — Other Ambulatory Visit: Payer: Self-pay | Admitting: Family Medicine

## 2013-03-20 NOTE — Telephone Encounter (Signed)
Last office visit 01/28/2013 with Dr. Patsy Lager.  Ok to refill?

## 2013-03-25 ENCOUNTER — Encounter: Payer: Self-pay | Admitting: Pulmonary Disease

## 2013-03-25 ENCOUNTER — Ambulatory Visit (INDEPENDENT_AMBULATORY_CARE_PROVIDER_SITE_OTHER): Payer: Medicare Other | Admitting: Pulmonary Disease

## 2013-03-25 VITALS — BP 130/72 | HR 110 | Temp 98.5°F | Ht 60.5 in | Wt 172.0 lb

## 2013-03-25 DIAGNOSIS — R911 Solitary pulmonary nodule: Secondary | ICD-10-CM

## 2013-03-25 DIAGNOSIS — J432 Centrilobular emphysema: Secondary | ICD-10-CM | POA: Insufficient documentation

## 2013-03-25 DIAGNOSIS — G4733 Obstructive sleep apnea (adult) (pediatric): Secondary | ICD-10-CM

## 2013-03-25 DIAGNOSIS — J449 Chronic obstructive pulmonary disease, unspecified: Secondary | ICD-10-CM

## 2013-03-25 MED ORDER — TIOTROPIUM BROMIDE MONOHYDRATE 18 MCG IN CAPS: 18.0000 ug | ORAL_CAPSULE | Freq: Every day | RESPIRATORY_TRACT | Status: DC

## 2013-03-25 MED ORDER — LEVALBUTEROL TARTRATE 45 MCG/ACT IN AERO: 2.0000 | INHALATION_SPRAY | RESPIRATORY_TRACT | Status: DC | PRN

## 2013-03-25 NOTE — Assessment & Plan Note (Signed)
Fortunately, this appears benign but it needs to be followed. Repeat CT chest December 2014

## 2013-03-25 NOTE — Progress Notes (Signed)
Subjective:    Patient ID: Darrold Junker, female    DOB: 1940/12/06, 72 y.o.   MRN: [id number]  Synopsis: Ms. Wolfert first saw the [facility] pulmonary clinic in September 2014 for shortness of breath. She had a previous heavy smoking history but he quit by the time she saw me. Lung function testing was actually not consistent with obstruction but she did have emphysema on her chest CT. Spiriva was started empirically for that. She also had a home sleep study which showed an apnea hypopnea index of 44.  HPI  03/25/2013 ROV >  Stevana says that her breathing is better sice the last visit.  She feels that the pressure from the machine is too nhight most of the time.  She supposed to have R foot surgery in [place] soon.  She is set to have this on 05/17/2013.  She still doesn't coug a lot.  She previously had bronchitis but she doesn't have it any more.  She feels that the albuterol inhaler was making her have night sweats.  The spiriva really helps with the shortness of breath.  She has a hard time paying for it.    Past Medical History  Diagnosis Date  . Diverticulosis of colon (without mention of hemorrhage)   . 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 Acute peptic ulcer, unspecified site, with hemorrhage and perforation, without mention of obstruction   . Chronic airway obstruction, not elsewhere classified   . Ventral hernia   . Hematochezia      Review of Systems  Constitutional: Negative for fever, chills and fatigue.  HENT: Negative for congestion, nosebleeds, postnasal drip and rhinorrhea.   Respiratory: Negative for cough, shortness of breath and wheezing.   Cardiovascular: Negative for chest pain, palpitations and leg swelling.       Objective:   Physical Exam  Filed Vitals:   03/25/13 0936  BP: 130/72  Pulse: 110  Temp: 98.5 F (36.9 C)  TempSrc: Oral  Height: 5' 0.5" (1.537 m)  Weight: 172 lb (78.019 kg)  SpO2: 96%  RA  Gen:  comfortable HEENT: NCAT, EOMi PULM: CTA B CV: RRR, no mgr AB: BS+, soft, nontender Ext: warm, no edema  June 2014 full pulmonary function test at [facility] ratio 80%, FEV1 1.74 L (101% predicted) total lung capacity is 3.81 L (91% predicted), DLCO 11.0 (56% predicted) May 2014 chest x-ray no acute polyp cardiopulmonary abnormality, questionable hyperinflation 01/2013 CT chest 7mm sub pleural nodule, mild emphysema     Assessment & Plan:   Obstructive sleep apnea Mrs. Gaber has not been tolerating the CPAP machine very well at home. She notes that the pressure changes too frequently and sometimes it feels like its too much pressure other times it is not enough.  Plan: -She needs to get a split-night study to sort all this out  Solitary pulmonary nodule Fortunately, this appears benign but it needs to be followed. Repeat CT chest December 2014  COPD, mild Based on her smoking history, history of shortness of breath, emphysema on chest CT, and response to Spiriva I feel strongly that she does have COPD. She does not have much chronic bronchitis.  Plan: -Continue Spiriva daily -Flu shot    Updated Medication List Outpatient Encounter Prescriptions as of 03/25/2013  Medication Sig  . acyclovir (ZOVIRAX) 400 MG tablet TAKE ONE TABLET TWICE A DAY  . albuterol (PROVENTIL HFA;VENTOLIN HFA) 108 (90 BASE) MCG/ACT inhaler Inhale 2 puffs into the lungs  every 6 (six) hours as needed for wheezing.  Marland Kitchen ibuprofen (ADVIL,MOTRIN) 800 MG tablet Take 800 mg by mouth every 8 (eight) hours as needed for pain.  Marland Kitchen ibuprofen (ADVIL,MOTRIN) 800 MG tablet TAKE 1 TABLET (800 MG TOTAL) BY MOUTH EVERY 8 (EIGHT) HOURS AS NEEDED FOR PAIN.  Marland Kitchen lidocaine (LIDODERM) 5 % Place 1 patch onto the skin daily. Remove & Discard patch within 12 hours or as directed by MD  . rOPINIRole (REQUIP) 3 MG tablet Take 1 tablet (3 mg total) by mouth at bedtime.  . traZODone (DESYREL) 50 MG tablet TAKE 1 &  1/2 TABLETS BY MOUTH AT BEDTIMEAS NEEDED FOR SLEEP  . valACYclovir (VALTREX) 500 MG tablet Take 500 mg by mouth 2 (two) times daily as needed.  . tiotropium (SPIRIVA) 18 MCG inhalation capsule Place 1 capsule (18 mcg total) into inhaler and inhale daily.

## 2013-03-25 NOTE — Assessment & Plan Note (Signed)
Mrs. Talmadge has not been tolerating the CPAP machine very well at home. She notes that the pressure changes too frequently and sometimes it feels like its too much pressure other times it is not enough.  Plan: -She needs to get a split-night study to sort all this out

## 2013-03-25 NOTE — Assessment & Plan Note (Signed)
Based on her smoking history, history of shortness of breath, emphysema on chest CT, and response to Spiriva I feel strongly that she does have COPD. She does not have much chronic bronchitis.  Plan: -Continue Spiriva daily -Flu shot

## 2013-03-25 NOTE — Patient Instructions (Signed)
We will arrange a sleep study for you in [place] We will set up a CT of your chest to evaluate the pulmonary nodule before you surgery in December Use spiriva dailiy Use the Xopenex inhaler as needed for shortness of breath  We will see you back in 6 months or sooner if needed

## 2013-03-27 ENCOUNTER — Telehealth: Payer: Self-pay | Admitting: Pulmonary Disease

## 2013-03-27 DIAGNOSIS — G4733 Obstructive sleep apnea (adult) (pediatric): Secondary | ICD-10-CM

## 2013-03-27 NOTE — Telephone Encounter (Signed)
LMOM TCB x1 **No DME on file to send order to

## 2013-03-27 NOTE — Telephone Encounter (Signed)
Pt made aware. Order sent

## 2013-03-27 NOTE — Telephone Encounter (Signed)
OK, let's change the resmed autotitration settings to 5-12cm H2O until the split night

## 2013-03-27 NOTE — Telephone Encounter (Signed)
Pt returned triage's call.  Pt states DME is [facility].   Antionette Fairy

## 2013-03-27 NOTE — Telephone Encounter (Signed)
Spoke with the pt She states that she continues to have issues with CPAP pressure- too much pressure causing her to have diff with using machine  She states that her split night is not scheduled until Dec and she wants to have the pressure decreased now  Please advise thanks!

## 2013-04-01 ENCOUNTER — Telehealth: Payer: Self-pay

## 2013-04-01 ENCOUNTER — Telehealth: Payer: Self-pay | Admitting: Pulmonary Disease

## 2013-04-01 NOTE — Telephone Encounter (Signed)
Samples are ready for pick up. Pt is aware. She will come by the [facility] office for these.

## 2013-04-01 NOTE — Telephone Encounter (Signed)
When calling the pt back she states her CPAP has been taken care of. Her daughter forgot to tell her that this had been done already. Nothing further is needed at this time.

## 2013-04-01 NOTE — Telephone Encounter (Signed)
Pt rolled off a stool while planting flowers(12" from stool to ground) on 03/25/13; no pain and no noted injury; pt worked all week with no pain. On 03/31/13 pt woke up with constant lt groin pain when weight bearing(pain level now 5); has done no heavy lifting or pulling. If pt is sitting or laying has no pain. Pt had hip replacement in 08/2012 with ortho in [place] and has appt 04/02/13 at 9:30 with ortho but pt would prefer not to travel distance and wants to know if could be seen in our office today. [place].Please advise.

## 2013-04-01 NOTE — Telephone Encounter (Signed)
Patient notified as instructed by telephone. Pt voiced understanding.  

## 2013-04-01 NOTE — Telephone Encounter (Signed)
Operating surgeon should evaluate.

## 2013-04-02 ENCOUNTER — Telehealth: Payer: Self-pay | Admitting: Pulmonary Disease

## 2013-04-02 NOTE — Telephone Encounter (Signed)
Kim from [facility] returned call & states that this was done remotely, just not documented.  States that since this has already been completed, no reason for message.  Nothing further needed.  Antionette Fairy

## 2013-04-03 ENCOUNTER — Telehealth: Payer: Self-pay | Admitting: Pulmonary Disease

## 2013-04-03 NOTE — Telephone Encounter (Signed)
Sleep lab is closed. Call back in AM

## 2013-04-04 NOTE — Telephone Encounter (Signed)
LMTCB for Susan [name] as he is out of the office today

## 2013-04-05 NOTE — Telephone Encounter (Signed)
I LMTCB with Onalee Hua. Marita [place] is off today but will return on Monday. Carron Curie, CMA

## 2013-04-08 NOTE — Telephone Encounter (Signed)
She is struggling with her current set up and needs a new titration study I want the split night to see if she has obstructive or central sleep apnea (can't tell from home study results)

## 2013-04-08 NOTE — Telephone Encounter (Signed)
I spoke with Susan [name]. An order was placed for a split night study. Pt had home sleep study earlier this year. It looks like an order was already placed for pt to get a CPAP. Susan [name] wants to know if this should be for a titration study instea? Please advise Dr. Kendrick Fries thanks

## 2013-04-09 NOTE — Telephone Encounter (Signed)
lmtcb x1 for Susan [name] on VM

## 2013-04-10 NOTE — Telephone Encounter (Signed)
lmomtcb x2 on vm for vernon

## 2013-04-12 NOTE — Telephone Encounter (Signed)
I spoke with Onalee Hua and advised. He will let Marita Kansas know. Carron Curie, CMA

## 2013-04-22 ENCOUNTER — Ambulatory Visit (INDEPENDENT_AMBULATORY_CARE_PROVIDER_SITE_OTHER): Payer: Medicare Other | Admitting: Family Medicine

## 2013-04-22 ENCOUNTER — Ambulatory Visit (INDEPENDENT_AMBULATORY_CARE_PROVIDER_SITE_OTHER)
Admission: RE | Admit: 2013-04-22 | Discharge: 2013-04-22 | Disposition: A | Discharge status: Home / Self Care | Payer: Medicare Other | Source: Ambulatory Visit | Attending: Pulmonary Disease | Admitting: Pulmonary Disease

## 2013-04-22 ENCOUNTER — Telehealth: Payer: Self-pay | Admitting: Pulmonary Disease

## 2013-04-22 ENCOUNTER — Ambulatory Visit (INDEPENDENT_AMBULATORY_CARE_PROVIDER_SITE_OTHER)
Admission: RE | Admit: 2013-04-22 | Discharge: 2013-04-22 | Disposition: A | Discharge status: Home / Self Care | Payer: Medicare Other | Source: Ambulatory Visit | Attending: Family Medicine | Admitting: Family Medicine

## 2013-04-22 ENCOUNTER — Encounter: Payer: Self-pay | Admitting: Family Medicine

## 2013-04-22 VITALS — BP 130/88 | HR 79 | Temp 97.8°F | Ht 60.5 in | Wt 176.5 lb

## 2013-04-22 DIAGNOSIS — Z96649 Presence of unspecified artificial hip joint: Secondary | ICD-10-CM

## 2013-04-22 DIAGNOSIS — R911 Solitary pulmonary nodule: Secondary | ICD-10-CM

## 2013-04-22 DIAGNOSIS — M25552 Pain in left hip: Secondary | ICD-10-CM

## 2013-04-22 DIAGNOSIS — S32512A Fracture of superior rim of left pubis, initial encounter for closed fracture: Secondary | ICD-10-CM

## 2013-04-22 DIAGNOSIS — S32509A Unspecified fracture of unspecified pubis, initial encounter for closed fracture: Secondary | ICD-10-CM

## 2013-04-22 DIAGNOSIS — M25559 Pain in unspecified hip: Secondary | ICD-10-CM

## 2013-04-22 MED ORDER — HYDROCODONE-ACETAMINOPHEN 5-325 MG PO TABS: 1.0000 | ORAL_TABLET | Freq: Four times a day (QID) | ORAL | Status: DC | PRN

## 2013-04-22 NOTE — Patient Instructions (Signed)
F/u mid january

## 2013-04-22 NOTE — Telephone Encounter (Signed)
Susan [name] called back and said to void this message.  He found out what he needed to know.

## 2013-04-22 NOTE — Progress Notes (Signed)
Date:  04/22/2013   Name:  Susan [name]   DOB:  [birth year]   MRN:  [id number] Gender: female Age: 72 y.o.  Primary Physician:  Kerby Nora, MD   Chief Complaint: Groin Pain   Subjective:   History of Present Illness:  Susan [name] is a 72 y.o. pleasant patient who presents with the following:  This patient is a very pleasant, and I remember her well. She is status post bilateral total hip arthroplasty in 2013, with the 1st done in January, and a 2nd done in April of 2013. She relates a story from several weeks ago when she was sitting and then rolled off and rolled onto the road. At that point a bystander came by and help her stand up. That was on a Monday, and she fell relatively okay at that point. By Sunday, she woke up and could hardly walk.  The following week she called the office, and asked if I could see the patient, but I recommended that she followup with her orthopedic surgeon who did her total hip arthroplasties who is in [place]. This is Dr. Providence Lanius who works in [place]. Dr. Providence Lanius saw the patient subsequently within one to 2 days, evaluated her, and the x-rays were reportedly normal. She actually Artie has an appointment set up his followup with him in the next 1-2 weeks to recheck. In the meantime she became more concerned, and she wanted me to evaluate her condition.  She continues to have significant pain more on the anterior pelvis. She is not having back pain. No lateral hip pain. No posterior hip pain.  Specimen time looking through her chart at [facility], where she had her total hip arthroplasties done. I reviewed the images. Reviewed operative summaries and notes from her operating physician. I attempted to find her orthopedist office to review his office note, but this does not appear to be available.  Patient Active Problem List   Diagnosis Date Noted  . Fracture of superior pubic ramus 04/23/2013  . 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 Solitary  pulmonary nodule 02/19/2013  . Chronic knee pain 01/15/2013  . Obstructive sleep apnea 10/11/2012  . Umbilical hernia 04/01/2012  . Urinary incontinence, urge 01/17/2012  . Diastolic dysfunction 04/21/2011  . Preoperative cardiovascular examination 04/21/2011  . DDD (degenerative disc disease) 10/26/2010  . RESTLESS LEG SYNDROME 02/23/2010  . Dyspnea on exertion 02/23/2010  . HIP REPLACEMENT, LEFT, HX OF 12/21/2009  . FOOT PAIN, BILATERAL 03/25/2009  . HYPERCHOLESTEROLEMIA 06/18/2008  . ALLERGIC RHINITIS 05/21/2008  . ANKLE PAIN, BILATERAL 05/21/2008  . DIVERTICULOSIS, COLON 11/26/2007  . HERPES SIMPLEX INFECTION, RECURRENT 10/17/2007    Past Medical History  Diagnosis Date  . Diverticulosis of colon (without mention of hemorrhage)   . 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 Acute peptic ulcer, unspecified site, with hemorrhage and perforation, without mention of obstruction   . Chronic airway obstruction, not elsewhere classified   . Ventral hernia   . Hematochezia     Past Surgical History  Procedure Laterality Date  . Bunionectomy    . Total hip arthroplasty  2007    left  . Appendectomy    . Tubal ligation    . Cosmetic surgery  1970's    abdomen. Tummy Tuck  . Foot surgery      left foot   . Total hip arthroplasty      bilateral     History   Social History  . Marital Status:  Married    Spouse Name: N/A    Number of Children: 3  . Years of Education: N/A   Occupational History  . self employeed     Owns beauty shop   Social History Main Topics  . Smoking status: Former Smoker -- 0.50 packs/day for 40 years    Types: Cigarettes    Quit date: 08/29/2000  . Smokeless tobacco: Never Used  . Alcohol Use: Yes     Comment: rare  . Drug Use: No  . Sexual Activity: Not on file   Other Topics Concern  . Not on file   Social History Narrative   Diet: Moderate, low fat, skips meals, some fruit and veggies   Regular exercise:  No   HCPOA is  [facility] and [employer],  Has living will, Not sure of code status (reviewed 2013)    Family History  Problem Relation Age of Onset  . Diabetes Mother   . Coronary artery disease Mother   . Lung disease Mother   . Heart disease Mother     CAD  . Coronary artery disease Father   . Kidney disease Father   . Diabetes Father   . Heart disease Father     CAD  . Cancer Neg Hx     No Known Allergies  Medication list has been reviewed and updated.  Review of Systems:  GEN: No fevers, chills. Nontoxic. Primarily MSK c/o today. MSK: Detailed in the HPI GI: tolerating PO intake without difficulty Neuro: No numbness, parasthesias, or tingling associated. Dr. Dareen Piano in [place] is going to be doing some upcoming foot surgery on the patient. Otherwise, the pertinent positives and negatives are listed above and in the HPI, otherwise a full review of systems has been reviewed and is negative unless noted positive.   Objective:   Physical Examination: BP 130/88  Pulse 79  Temp(Src) 97.8 F (36.6 C) (Oral)  Ht 5' 0.5" (1.537 m)  Wt 176 lb 8 oz (80.06 kg)  BMI 33.89 kg/m2  Ideal Body Weight: Weight in (lb) to have BMI = 25: 129.9   GEN: WDWN, NAD, Non-toxic, Alert & Oriented x 3 HEENT: Atraumatic, Normocephalic.  Ears and Nose: No external deformity. EXTR: No clubbing/cyanosis/edema NEURO: MARKEDLY ANTALGIC, USING WALKER PSYCH: Normally interactive. Conversant. Not depressed or anxious appearing.  Calm demeanor.   HIP EXAM: SIDE: B ROM: Abduction, Flexion, Internal and External range of motion: RELATIVELY NORMAL S/P THA Superior PELVIC RAMUS ON THE LEFT IS NOTABLY TENDER TO PALPATION Pain with terminal IROM and EROM: ANTERIOR ONLY GTB: NT SLR: NEG Knees: No effusion FABER: NT Piriformis: NT at direct palpation Str: flexion: 4/5 abduction: 4/5 adduction: 4/5  Dg Hip Complete Left  04/22/2013   CLINICAL DATA:  Trauma 1 month ago. Left-sided pain the last 2  weeks, suspect pubic ramus fracture on left.  EXAM: LEFT HIP - COMPLETE 2+ VIEW  COMPARISON:  None.  FINDINGS: There is a healing fracture of the left superior pubic rami. Bilateral hip replacements are identified without dislocation.  IMPRESSION: There is a healing fracture of the left superior pubic rami.   Electronically Signed   By: Sherian Rein M.D.   On: 04/22/2013 10:34    Assessment & Plan:    Fracture of superior pubic ramus, left, closed, initial encounter  Left hip pain - Plan: DG Hip Complete Left  HIP REPLACEMENT, LEFT, HX OF  >40 minutes spent in face to face time with patient, >50% spent in counselling or  coordination of care: spent in coordination of care review of all prior notes, review of prior records and images. Obtain x-rays of the LEFT hip stat. My independent review his consistent with radiological interpretation. There does appear to be evidence of a superior pelvic ramus fracture.  I discussed this with the patient at length. I suggested that she start taking calcium and vitamin D. Given her age and pain level currently, I suspected this fracture will take anywhere from 8-12 weeks to heal. She is going to continue to use her walker and followup with me in about 5 or 6 weeks.  Patient Instructions  F/u mid january   Orders Today:  Orders Placed This Encounter  Procedures  . DG Hip Complete Left    New medications, updates to list, dose adjustments: Meds ordered this encounter  Medications  . Ibuprofen 200 MG CAPS    Sig: Take 600 mg by mouth 3 (three) times daily.  Marland Kitchen HYDROcodone-acetaminophen (NORCO/VICODIN) 5-325 MG per tablet    Sig: Take 1 tablet by mouth every 6 (six) hours as needed for moderate pain.    Dispense:  40 tablet    Refill:  0    Signed,  Rylee Nuzum T. Ahaan Zobrist, MD, CAQ Sports Medicine  [facility] [address] [place] [address] Phone: [phone] Fax: [phone]  Updated Complete Medication List:     Medication List       This list is accurate as of: 04/22/13 11:59 PM.  Always use your most recent med list.               HYDROcodone-acetaminophen 5-325 MG per tablet  Commonly known as:  NORCO/VICODIN  Take 1 tablet by mouth every 6 (six) hours as needed for moderate pain.     Ibuprofen 200 MG Caps  Take 600 mg by mouth 3 (three) times daily.     lidocaine 5 %  Commonly known as:  LIDODERM  Place 1 patch onto the skin daily. Remove & Discard patch within 12 hours or as directed by MD     rOPINIRole 3 MG tablet  Commonly known as:  REQUIP  Take 1 tablet (3 mg total) by mouth at bedtime.     tiotropium 18 MCG inhalation capsule  Commonly known as:  SPIRIVA  Place 1 capsule (18 mcg total) into inhaler and inhale daily.     traZODone 50 MG tablet  Commonly known as:  DESYREL  TAKE 1 & 1/2 TABLETS BY MOUTH AT BEDTIMEAS NEEDED FOR SLEEP     valACYclovir 500 MG tablet  Commonly known as:  VALTREX  Take 500 mg by mouth 2 (two) times daily as needed.

## 2013-04-22 NOTE — Progress Notes (Signed)
Pre-visit discussion using our clinic review tool. No additional management support is needed unless otherwise documented below in the visit note.  

## 2013-04-23 ENCOUNTER — Encounter: Payer: Self-pay | Admitting: Pulmonary Disease

## 2013-04-23 ENCOUNTER — Ambulatory Visit (HOSPITAL_BASED_OUTPATIENT_CLINIC_OR_DEPARTMENT_OTHER): Payer: Medicare Other | Attending: Pulmonary Disease | Admitting: Radiology

## 2013-04-23 ENCOUNTER — Telehealth: Payer: Self-pay

## 2013-04-23 VITALS — Ht 61.0 in | Wt 170.0 lb

## 2013-04-23 DIAGNOSIS — S32519A Fracture of superior rim of unspecified pubis, initial encounter for closed fracture: Secondary | ICD-10-CM | POA: Insufficient documentation

## 2013-04-23 DIAGNOSIS — G4733 Obstructive sleep apnea (adult) (pediatric): Secondary | ICD-10-CM

## 2013-04-23 NOTE — Telephone Encounter (Signed)
Pt aware of ct results.  No further action needed. Caulfield,Ashley L

## 2013-04-23 NOTE — Telephone Encounter (Signed)
Message copied by Velvet Bathe on Tue Apr 23, 2013  9:32 AM ------      Message from: Lupita Leash      Created: Tue Apr 23, 2013  8:49 AM       A,            Please let her know that the CT chest showed that her nodule is unchanged and she doesn't need another CT for 12 months.            Thanks      B ------

## 2013-04-25 ENCOUNTER — Ambulatory Visit (INDEPENDENT_AMBULATORY_CARE_PROVIDER_SITE_OTHER): Payer: Medicare Other | Admitting: Family Medicine

## 2013-04-25 ENCOUNTER — Encounter: Payer: Self-pay | Admitting: Family Medicine

## 2013-04-25 VITALS — BP 120/78 | HR 93 | Temp 97.7°F | Ht 60.5 in | Wt 176.0 lb

## 2013-04-25 DIAGNOSIS — Z01810 Encounter for preprocedural cardiovascular examination: Secondary | ICD-10-CM

## 2013-04-25 DIAGNOSIS — Z0000 Encounter for general adult medical examination without abnormal findings: Secondary | ICD-10-CM

## 2013-04-25 DIAGNOSIS — J449 Chronic obstructive pulmonary disease, unspecified: Secondary | ICD-10-CM

## 2013-04-25 LAB — CBC WITH DIFFERENTIAL/PLATELET
Basophils Relative: 0.5 % (ref 0.0–3.0)
Eosinophils Absolute: 0.3 10*3/uL (ref 0.0–0.7)
Eosinophils Relative: 4.1 % (ref 0.0–5.0)
HCT: 39.7 % (ref 36.0–46.0)
Hemoglobin: 13.4 g/dL (ref 12.0–15.0)
Lymphs Abs: 1.6 10*3/uL (ref 0.7–4.0)
MCHC: 33.7 g/dL (ref 30.0–36.0)
MCV: 90 fl (ref 78.0–100.0)
Monocytes Absolute: 0.5 10*3/uL (ref 0.1–1.0)
Neutro Abs: 3.9 10*3/uL (ref 1.4–7.7)
Neutrophils Relative %: 61.9 % (ref 43.0–77.0)
RBC: 4.41 Mil/uL (ref 3.87–5.11)
WBC: 6.3 10*3/uL (ref 4.5–10.5)

## 2013-04-25 LAB — COMPREHENSIVE METABOLIC PANEL
AST: 16 U/L (ref 0–37)
Albumin: 4 g/dL (ref 3.5–5.2)
Alkaline Phosphatase: 84 U/L (ref 39–117)
BUN: 14 mg/dL (ref 6–23)
Glucose, Bld: 88 mg/dL (ref 70–99)
Potassium: 3.8 mEq/L (ref 3.5–5.1)
Total Bilirubin: 0.9 mg/dL (ref 0.3–1.2)

## 2013-04-25 NOTE — Assessment & Plan Note (Signed)
Minimally to moderately invasive surgery with low to moderate risk pt.  EKG stable, previous cardiology eval in last year nml.  Eval with labs.  Cleared for surgery.

## 2013-04-25 NOTE — Assessment & Plan Note (Signed)
Resolved DOE with spiriva.

## 2013-04-25 NOTE — Progress Notes (Signed)
Pre-visit discussion using our clinic review tool. No additional management support is needed unless otherwise documented below in the visit note.  

## 2013-04-25 NOTE — Progress Notes (Signed)
   Subjective:    Patient ID: Susan [name], female    DOB: 1941/01/05, 72 y.o.   MRN: [id number]  HPI  72 year old female with history of OSA, diastolic dysfunction and recently diagnosed mild COPD ( cause of DOE) presents for pre-op eval.  Started on spiriva.. She has been  Doing very well on this. Resolution of DOE.  She has appt for right foot on 12/26.  Extensive foot surgery.   BP Readings from Last 3 Encounters:  04/25/13 120/78  04/22/13 130/88  03/25/13 130/72   Has had recent surgeries in last 2 years... No complicaitons. Had Cardiologist  Pre op eval in 2012.  Stable EKG, nml ECHO in 11/2012.     Review of Systems  Constitutional: Negative for fever and fatigue.  HENT: Positive for congestion. Negative for ear pain.        Using mucinex D  Eyes: Negative for pain.  Respiratory: Negative for cough, shortness of breath and wheezing.   Cardiovascular: Negative for chest pain, palpitations and leg swelling.  Gastrointestinal: Negative for abdominal pain.  Genitourinary: Negative for dysuria.  Neurological: Negative for syncope and headaches.       Objective:   Physical Exam  Constitutional: She is oriented to person, place, and time. Vital signs are normal. She appears well-developed and well-nourished. She is cooperative.  Non-toxic appearance. She does not appear ill. No distress.  Using walker  HENT:  Head: Normocephalic.  Right Ear: Hearing, tympanic membrane, external ear and ear canal normal. Tympanic membrane is not erythematous, not retracted and not bulging.  Left Ear: Hearing, tympanic membrane, external ear and ear canal normal. Tympanic membrane is not erythematous, not retracted and not bulging.  Nose: No mucosal edema or rhinorrhea. Right sinus exhibits no maxillary sinus tenderness and no frontal sinus tenderness. Left sinus exhibits no maxillary sinus tenderness and no frontal sinus tenderness.  Mouth/Throat: Uvula is midline, oropharynx is clear  and moist and mucous membranes are normal.  Eyes: Conjunctivae, EOM and lids are normal. Pupils are equal, round, and reactive to light. Lids are everted and swept, no foreign bodies found.  Neck: Trachea normal and normal range of motion. Neck supple. Carotid bruit is not present. No mass and no thyromegaly present.  Cardiovascular: Normal rate, regular rhythm, S1 normal, S2 normal, normal heart sounds, intact distal pulses and normal pulses.  Exam reveals no gallop and no friction rub.   No murmur heard. Pulmonary/Chest: Effort normal and breath sounds normal. Not tachypneic. No respiratory distress. She has no decreased breath sounds. She has no wheezes. She has no rhonchi. She has no rales.  Abdominal: Soft. Normal appearance and bowel sounds are normal. There is no tenderness.  Neurological: She is alert and oriented to person, place, and time.  Skin: Skin is warm, dry and intact. No rash noted.  Psychiatric: Her speech is normal and behavior is normal. Judgment and thought content normal. Her mood appears not anxious. Cognition and memory are normal. She does not exhibit a depressed mood.          Assessment & Plan:

## 2013-04-25 NOTE — Patient Instructions (Signed)
Stop at lab on way out. We will fax records and labs to surgeon.

## 2013-05-08 DIAGNOSIS — G4733 Obstructive sleep apnea (adult) (pediatric): Secondary | ICD-10-CM

## 2013-05-08 NOTE — Sleep Study (Signed)
   NAME: Susan [name] DATE OF BIRTH:  1940/10/29 MEDICAL RECORD NUMBER [id number]  LOCATION: [facility]  PHYSICIAN: Barbaraann Share  DATE OF STUDY: 04/23/2013  SLEEP STUDY TYPE: Nocturnal Polysomnogram               REFERRING PHYSICIAN: Lupita Leash, MD  INDICATION FOR STUDY: Hypersomnia with sleep apnea  EPWORTH SLEEPINESS SCORE:   HEIGHT: 5\' 1"  (154.9 cm)  WEIGHT: 170 lb (77.111 kg)    Body mass index is 32.14 kg/(m^2).  NECK SIZE: 15 in.  MEDICATIONS:   SLEEP ARCHITECTURE: The patient had a total sleep time of 342 minutes with no slow-wave sleep and decreased quantity of REM. Sleep onset latency was normal at 4 minutes, and REM onset was delayed. Sleep efficiency was 96% during the diagnostic portion of the study, an 86% during the titration portion.  RESPIRATORY DATA: The patient was found to have 45 obstructive events in the first 181 minutes of sleep. This gave her an AHI of 15 events per hour. The events occurred in all body positions, and there was mild snoring noted throughout. By split-night protocol, the patient was then fitted with a small ResMed Mirage FX full face mask, and found to have an optimal pressure of 10 cm of water. The patient only had 18 minutes of total sleep time on the final setting.  OXYGEN DATA: There was oxygen desaturation as low as 84% with the patient's obstructive events  CARDIAC DATA: Rare PVC noted, but no clinically significant arrhythmias were seen  MOVEMENT/PARASOMNIA: Small numbers of leg jerks noted with no significant sleep disruption. No abnormal behaviors seen  IMPRESSION/ RECOMMENDATION:    1) mild obstructive sleep apnea with an AHI of 15 events per hour and oxygen desaturation as low as 84% during the diagnostic portion of the study. The patient was then fitted with a small ResMed Mirage fx full face mask, and titrated to an optimal pressure of 10 cm of water. This was toward the end of the study, and the  patient only had 18 minutes of total sleep time on this setting. Treatment for this degree of sleep apnea can include a trial of weight loss alone, upper airway surgery, dental appliance, and also CPAP. Because of its mild nature, this does not represent an increase cardiovascular risk to the patient. Clinical correlation is suggested.  2) rare PVC noted, but no clinically significant arrhythmias were seen.   Barbaraann Share Diplomate, American Board of Sleep Medicine  ELECTRONICALLY SIGNED ON:  05/08/2013, 8:08 AM [facility] PH: [phone]   FX: [phone] ACCREDITED BY THE AMERICAN ACADEMY OF SLEEP MEDICINE

## 2013-05-09 ENCOUNTER — Telehealth: Payer: Self-pay | Admitting: Pulmonary Disease

## 2013-05-09 NOTE — Telephone Encounter (Signed)
Called pt, advised that we would not be in [place] office until 06/03/2013 for her sample but could have it ready today at the [facility] office.  She said she could make arrangements to come get it.  Nothing further needed. Reilynn Lauro L, CMA

## 2013-05-10 ENCOUNTER — Other Ambulatory Visit: Payer: Self-pay | Admitting: Family Medicine

## 2013-05-10 NOTE — Telephone Encounter (Signed)
Last office visit 04/25/2013.  Ok to refill? 

## 2013-06-10 ENCOUNTER — Telehealth: Payer: Self-pay | Admitting: Pulmonary Disease

## 2013-06-10 ENCOUNTER — Telehealth: Payer: Self-pay

## 2013-06-10 MED ORDER — SULFAMETHOXAZOLE-TMP DS 800-160 MG PO TABS: 1.0000 | ORAL_TABLET | Freq: Two times a day (BID) | ORAL | Status: DC

## 2013-06-10 NOTE — Telephone Encounter (Signed)
Pt recently had foot surgery in [place]; pt cannot walk and in w/c, pt cannot drive car also. Pt cannot get out of home; pt had UTI while in the hospital and antibiotic was given but did not clear up UTI; voiding more frequently, odor. No back or abd pain, no fever, no burning or pain when urinate. Pt got strip at pharmacy to test for  UTI and it indicated UTI. [place]. Pt request cb.

## 2013-06-10 NOTE — Telephone Encounter (Signed)
Left message for Susan [name] that antibiotics have been sent to her pharmacy.  If symptoms are not resolved after antibiotics, she will need to make an appointment to be seen.

## 2013-06-10 NOTE — Telephone Encounter (Signed)
Will treat with sulfa/tmp x 3 days.. If symptoms not resolved she needs to make appt to be seen!

## 2013-06-10 NOTE — Telephone Encounter (Signed)
Pt is aware that her samples are ready to be picked up in the [place] office. As to her sleep study, I advised that I would get this message to BQ to address results.

## 2013-06-17 NOTE — Telephone Encounter (Signed)
Please let her know that she has mild sleep apnea, but we don't necessarily have to use CPAP to treat her.  The most important approach is to lose weight.  After that we can consider CPAP vs a dental appliance.  We can discuss further on the next visit.

## 2013-06-17 NOTE — Telephone Encounter (Signed)
I spoke with patient about results and she verbalized understanding and had no questions 

## 2013-06-20 ENCOUNTER — Encounter: Payer: Self-pay | Admitting: Family Medicine

## 2013-06-20 ENCOUNTER — Ambulatory Visit (INDEPENDENT_AMBULATORY_CARE_PROVIDER_SITE_OTHER): Payer: Medicare Other | Admitting: Family Medicine

## 2013-06-20 VITALS — BP 140/78 | HR 92 | Temp 97.4°F

## 2013-06-20 DIAGNOSIS — R300 Dysuria: Secondary | ICD-10-CM

## 2013-06-20 LAB — POCT URINALYSIS DIPSTICK
BILIRUBIN UA: NEGATIVE
GLUCOSE UA: NEGATIVE
Ketones, UA: NEGATIVE
Leukocytes, UA: NEGATIVE
NITRITE UA: NEGATIVE
PH UA: 5
Spec Grav, UA: <=1.005
Urobilinogen, UA: 0.2

## 2013-06-20 MED ORDER — CIPROFLOXACIN HCL 250 MG PO TABS: 250.0000 mg | ORAL_TABLET | Freq: Two times a day (BID) | ORAL | Status: DC

## 2013-06-20 NOTE — Addendum Note (Signed)
Addended by: Carter Kitten on: 06/20/2013 12:59 PM   Modules accepted: Orders

## 2013-06-20 NOTE — Assessment & Plan Note (Signed)
Not clearly a UTI on UA, not enough for micro...given recent UTI likely partially treated... Will treat with 3 days of Cipro. Send for culture

## 2013-06-20 NOTE — Patient Instructions (Signed)
Complete all antibiotics. We will call with urine culture results. Push fluids.

## 2013-06-20 NOTE — Progress Notes (Signed)
   Subjective:    Patient ID: Susan [name], female    DOB: 01/21/1941, 73 y.o.   MRN: [id number]  HPI 73 year old female S/P recent extensive foot surgery presets for dysuria.  While in hospital  in [place], she was catheterized. Had UTI... Treated with ? Antibiotics.  Called to office on 1/19... Could not come in to office given decreased mobility... Treated with Bactrim x 3 days. Symptoms resolved completely.  She reports her foot is doing well.. Using post op shoe, stiches resolved.  She is not using pain medication. She has had dysuria, urine odor returned in last few days.  Also urinary frequency.   NO fever. No flank pain, no abdominal pain.   Review of Systems  Constitutional: Negative for fever and fatigue.  HENT: Negative for ear pain.   Eyes: Negative for pain.  Respiratory: Negative for chest tightness and shortness of breath.   Cardiovascular: Negative for chest pain, palpitations and leg swelling.  Gastrointestinal: Negative for abdominal pain.  Genitourinary: Negative for dysuria.       Objective:   Physical Exam  Constitutional: Vital signs are normal. She appears well-developed and well-nourished. She is cooperative.  Non-toxic appearance. She does not appear ill. No distress.  HENT:  Head: Normocephalic.  Right Ear: Hearing, tympanic membrane, external ear and ear canal normal. Tympanic membrane is not erythematous, not retracted and not bulging.  Left Ear: Hearing, tympanic membrane, external ear and ear canal normal. Tympanic membrane is not erythematous, not retracted and not bulging.  Nose: No mucosal edema or rhinorrhea. Right sinus exhibits no maxillary sinus tenderness and no frontal sinus tenderness. Left sinus exhibits no maxillary sinus tenderness and no frontal sinus tenderness.  Mouth/Throat: Uvula is midline, oropharynx is clear and moist and mucous membranes are normal.  Eyes: Conjunctivae, EOM and lids are normal. Pupils are equal, round, and  reactive to light. Lids are everted and swept, no foreign bodies found.  Neck: Trachea normal and normal range of motion. Neck supple. Carotid bruit is not present. No mass and no thyromegaly present.  Cardiovascular: Normal rate, regular rhythm, S1 normal, S2 normal, normal heart sounds, intact distal pulses and normal pulses.  Exam reveals no gallop and no friction rub.   No murmur heard. Pulmonary/Chest: Effort normal and breath sounds normal. Not tachypneic. No respiratory distress. She has no decreased breath sounds. She has no wheezes. She has no rhonchi. She has no rales.  Abdominal: Soft. Normal appearance and bowel sounds are normal. There is no tenderness.  Neurological: She is alert.  Skin: Skin is warm, dry and intact. No rash noted.  Psychiatric: Her speech is normal and behavior is normal. Judgment and thought content normal. Her mood appears not anxious. Cognition and memory are normal. She does not exhibit a depressed mood.          Assessment & Plan:

## 2013-06-20 NOTE — Progress Notes (Signed)
Pre-visit discussion using our clinic review tool. No additional management support is needed unless otherwise documented below in the visit note.  

## 2013-06-22 LAB — URINE CULTURE
Colony Count: NO GROWTH
ORGANISM ID, BACTERIA: NO GROWTH

## 2013-07-13 ENCOUNTER — Other Ambulatory Visit: Payer: Self-pay | Admitting: Family Medicine

## 2013-07-14 NOTE — Telephone Encounter (Signed)
Last office visit 06/20/2013.  Ok to refill?

## 2013-07-16 ENCOUNTER — Ambulatory Visit: Payer: Medicare Other | Admitting: Family Medicine

## 2013-07-25 ENCOUNTER — Telehealth: Payer: Self-pay | Admitting: Pulmonary Disease

## 2013-07-25 NOTE — Telephone Encounter (Signed)
Samples will be placed up front for pick up. Pt is aware. Also aware that these samples are at the [facility] location.

## 2013-08-12 ENCOUNTER — Ambulatory Visit: Payer: Medicare Other | Admitting: Family Medicine

## 2013-08-13 ENCOUNTER — Other Ambulatory Visit: Payer: Self-pay | Admitting: Family Medicine

## 2013-08-19 ENCOUNTER — Ambulatory Visit (INDEPENDENT_AMBULATORY_CARE_PROVIDER_SITE_OTHER): Payer: Medicare Other | Admitting: Family Medicine

## 2013-08-19 ENCOUNTER — Encounter: Payer: Self-pay | Admitting: Family Medicine

## 2013-08-19 VITALS — BP 140/90 | HR 80 | Temp 98.3°F | Ht 60.5 in | Wt 184.0 lb

## 2013-08-19 DIAGNOSIS — IMO0002 Reserved for concepts with insufficient information to code with codable children: Secondary | ICD-10-CM

## 2013-08-19 DIAGNOSIS — M19019 Primary osteoarthritis, unspecified shoulder: Secondary | ICD-10-CM

## 2013-08-19 DIAGNOSIS — M1711 Unilateral primary osteoarthritis, right knee: Secondary | ICD-10-CM

## 2013-08-19 DIAGNOSIS — M1710 Unilateral primary osteoarthritis, unspecified knee: Secondary | ICD-10-CM

## 2013-08-19 MED ORDER — TRAZODONE HCL 50 MG PO TABS: ORAL_TABLET | ORAL | Status: DC

## 2013-08-19 MED ORDER — ROPINIROLE HCL 3 MG PO TABS: ORAL_TABLET | ORAL | Status: DC

## 2013-08-19 NOTE — Patient Instructions (Signed)
Call and ask insurance about which Hyaluronic acid injections for the knee they cover. Synvisc, or whichever they prefer from an insurance standpoint.

## 2013-08-19 NOTE — Progress Notes (Signed)
Date:  08/19/2013   Name:  Susan [name]   DOB:  24-Sep-1940   MRN:  [id number]  Primary Physician:  Susan Lofts, MD   Chief Complaint: Knee Pain and Shoulder Pain   Subjective:   History of Present Illness:  Susan [name] is a 73 y.o. pleasant patient who presents with the following:  Out of surgery x 3 months from foot surgery. Wound started to open and was opening and opening. Put a wound vac on foot.   Went to Dr. Ouida Sills in [place].   R shoulder, artic: the patient has end-stage glenohumeral joint osteoarthritis, and she essentially needs a total shoulder replacement in terms of what can be done from an operative standpoint. She is relatively functioning well right now. She has had a few intra-articular injections which provide 2 or 3 months of relief.  R knee: RIGHT knee pain, significant crepitus and joint pain and clinical evidence of osteoarthritis. I could not find any recent films to evaluate the joint.  Patient Active Problem List   Diagnosis Date Noted  . Dysuria 06/20/2013  . Fracture of superior pubic ramus 04/23/2013  . 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 Solitary pulmonary nodule 02/19/2013  . Chronic knee pain 01/15/2013  . Obstructive sleep apnea 10/11/2012  . Umbilical hernia 87/56/4332  . Urinary incontinence, urge 01/17/2012  . Diastolic dysfunction 95/18/8416  . Preoperative cardiovascular examination 04/21/2011  . DDD (degenerative disc disease) 10/26/2010  . RESTLESS LEG SYNDROME 02/23/2010  . HIP REPLACEMENT, LEFT, HX OF 12/21/2009  . FOOT PAIN, BILATERAL 03/25/2009  . HYPERCHOLESTEROLEMIA 06/18/2008  . ALLERGIC RHINITIS 05/21/2008  . ANKLE PAIN, BILATERAL 05/21/2008  . DIVERTICULOSIS, COLON 11/26/2007  . HERPES SIMPLEX INFECTION, RECURRENT 10/17/2007    Past Medical History  Diagnosis Date  . Diverticulosis of colon (without mention of hemorrhage)   . 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 Acute peptic ulcer, unspecified site,  with hemorrhage and perforation, without mention of obstruction   . Chronic airway obstruction, not elsewhere classified   . Ventral hernia   . Hematochezia     Past Surgical History  Procedure Laterality Date  . Bunionectomy    . Total hip arthroplasty  2007    left  . Appendectomy    . Tubal ligation    . Cosmetic surgery  1970's    abdomen. Tummy Tuck  . Foot surgery      left foot   . Total hip arthroplasty      bilateral     History   Social History  . Marital Status: Married    Spouse Name: N/A    Number of Children: 3  . Years of Education: N/A   Occupational History  . self employeed     Owns beauty shop   Social History Main Topics  . Smoking status: Former Smoker -- 0.50 packs/day for 40 years    Types: Cigarettes    Quit date: 08/29/2000  . Smokeless tobacco: Never Used  . Alcohol Use: Yes     Comment: rare  . Drug Use: No  . Sexual Activity: Not on file   Other Topics Concern  . Not on file   Social History Narrative   Diet: Moderate, low fat, skips meals, some fruit and veggies   Regular exercise:  No   HCPOA is [name] and [name],  Has living will, Not sure of code status (reviewed 2013)    Family History  Problem Relation Age of Onset  .  Diabetes Mother   . Coronary artery disease Mother   . Lung disease Mother   . Heart disease Mother     CAD  . Coronary artery disease Father   . Kidney disease Father   . Diabetes Father   . Heart disease Father     CAD  . Cancer Neg Hx     No Known Allergies  Medication list has been reviewed and updated.  Review of Systems:  GEN: No fevers, chills. Nontoxic. Primarily MSK c/o today. MSK: Detailed in the HPI GI: tolerating PO intake without difficulty Neuro: No numbness, parasthesias, or tingling associated. Otherwise the pertinent positives of the ROS are noted above.   Objective:   Physical Examination: BP 140/90  Pulse 80  Temp(Src) 98.3 F (36.8 C) (Oral)  Ht 5'  0.5" (1.537 m)  Wt 184 lb (83.462 kg)  BMI 35.33 kg/m2  Ideal Body Weight: Weight in (lb) to have BMI = 25: 129.9   GEN: WDWN, NAD, Non-toxic, Alert & Oriented x 3 HEENT: Atraumatic, Normocephalic.  Ears and Nose: No external deformity. EXTR: No clubbing/cyanosis/edema NEURO: some antalgia PSYCH: Normally interactive. Conversant. Not depressed or anxious appearing.  Calm demeanor.    RIGHT knee lacks 3 of extension and she has flexion to 115. Tenderness significant on the medial greater than lateral joint line. Some tenderness with manipulation of the patella. Medial collateral ligament and lateral collateral ligament are stable. Negative Lockman. There is some pain on flexion tension McMurray's test.  RIGHT shoulder with painful arc of motion in abduction, full range of motion is achieved. Notable crepitus with motion. Crossover is quite painful. Strength is 4/5 in abduction and flexion as well as internal and external rotation. Michel Bickers test is notably tender as well. Virtually all manipulation of the shoulder causes pain.  *RADIOLOGY REPORT*   Clinical Data:  Right shoulder pain.   MRI OF THE RIGHT SHOULDER WITHOUT CONTRAST   Technique:  Multiplanar, multisequence MR imaging of the right shoulder was performed.  No intravenous contrast was administered.   Comparison:  Radiographs dated 10/11/2012   FINDINGS: Rotator cuff:  There are large chronic full-thickness retracted tears of the infraspinatus and supraspinatus tendons.  The teres minor and subscapularis tendons are intact. Muscles:  Marked atrophy of all of the muscles of the rotator cuff but particularly the infraspinatus and supraspinatus. Biceps long head:  The bicipital groove is empty.  I suspect the patient has a chronic complete tear of the long head of the biceps tendon with distal retraction.   Acromioclavicular Joint:  Moderate arthritis of the acromioclavicular joint.  The undersurface of the  acromion is eroded due to superior subluxation of the humeral head. Glenohumeral Joint:  Severe osteoarthritis of the glenohumeral joint.  Large joint effusion with extensive debris within the joint.  The joint fluid has extended into the subacromial/subdeltoid bursae through the large rotator cuff tears. There are multiple septations within the markedly distended bursal spaces with extensive debris. This is consistent with severe synovitis superimposed on the arthritis.   Labrum:  The labrum is disintegrated. Bones:  Severe osteoarthritis of the glenohumeral joint with erosion of the undersurface of the acromion due to articulation with the humeral head.   IMPRESSION:   1.  Severe osteoarthritis of the glenohumeral joint with a prominent joint effusion with debris.  The bursae are distended with fluid and debris. 2. Chronic large full-thickness retracted tears of the infraspinatus and supraspinatus tendons with marked atrophy of the muscles of the  rotator cuff. 3.  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     Original Report Authenticated By: Lorriane Shire, M.D. *RADIOLOGY REPORT*   Clinical Data: Right shoulder pain   RIGHT SHOULDER - 2+ VIEW   Comparison: None.   Findings: Three views of the right shoulder submitted. Degenerative narrowing of the glenohumeral joint.  Spurring of the humeral head.  Degenerative changes acromioclavicular joint.  High riding humeral head highly suspicious for rotator cuff insufficiency.  No acute fracture or subluxation.   IMPRESSION:   Degenerative narrowing of the glenohumeral joint.  Spurring of the humeral head.  Degenerative changes acromioclavicular joint. High riding humeral head highly suspicious for rotator cuff insufficiency.  No acute fracture or subluxation.     Original Report Authenticated By: Lahoma Crocker, M.D.   Assessment & Plan:   Glenohumeral arthritis,  Severe  Osteoarthritis of right knee   End-stage right-sided glenohumeral arthritis with multiple other problems in the shoulder. The patient is not interested in all and operative management or total shoulder arthroplasty. I think it is reasonable to do an intra-articular injection for pain control management.  Intrarticular Shoulder Injection, RIGHT Verbal consent was obtained from the patient. Risks including infection explained and contrasted with benefits and alternatives. Patient prepped with Chloraprep and Ethyl Chloride used for anesthesia. An intraarticular shoulder injection was performed using the posterior approach. The patient tolerated the procedure well and had decreased pain post injection. No complications. Injection: 8 cc of Lidocaine 1% and 2 cc of Depo-Medrol 40 mg. Needle: 6 gauge   Right-sided knee pain with clinical osteoarthritis. For now, inject with corticosteroid. Depending on response, she would likely be a good candidate for hyaluronic acid injections.  Knee Injection, RIGHT Patient verbally consented to procedure. Risks (including potential rare risk of infection), benefits, and alternatives explained. Sterilely prepped with Chloraprep. Ethyl cholride used for anesthesia. 8 cc Lidocaine 1% mixed with 2 cc of Depo-Medrol 40 mg injected using the anteromedial approach without difficulty. No complications with procedure and tolerated well. Patient had decreased pain post-injection.   Patient's Medications  New Prescriptions   No medications on file  Previous Medications   TIOTROPIUM (SPIRIVA) 18 MCG INHALATION CAPSULE    Place 1 capsule (18 mcg total) into inhaler and inhale daily.   VALACYCLOVIR (VALTREX) 500 MG TABLET    Take 500 mg by mouth 2 (two) times daily as needed.  Modified Medications   Modified Medication Previous Medication   ROPINIROLE (REQUIP) 3 MG TABLET rOPINIRole (REQUIP) 3 MG tablet      TAKE ONE TABLET AT BEDTIME    TAKE ONE TABLET AT BEDTIME    TRAZODONE (DESYREL) 50 MG TABLET traZODone (DESYREL) 50 MG tablet      TAKE ONE AND ONE-HALF TABLETS AT BEDTIME. AS NEEDED FOR SLEEP    TAKE ONE AND ONE-HALF TABLETS AT BEDTIME. AS NEEDED FOR SLEEP  Discontinued Medications   CIPROFLOXACIN (CIPRO) 250 MG TABLET    Take 1 tablet (250 mg total) by mouth 2 (two) times daily.   Patient Instructions  Call and ask insurance about which Hyaluronic acid injections for the knee they cover. Synvisc, or whichever they prefer from an insurance standpoint.   Signed,  Maud Deed. Avyon Herendeen, MD, [place] at [facility] [place] [address] Phone: [phone] Fax: [phone]

## 2013-08-19 NOTE — Progress Notes (Signed)
Pre visit review using our clinic review tool, if applicable. No additional management support is needed unless otherwise documented below in the visit note. 

## 2013-08-20 DIAGNOSIS — M19019 Primary osteoarthritis, unspecified shoulder: Secondary | ICD-10-CM | POA: Insufficient documentation

## 2013-08-26 ENCOUNTER — Encounter: Payer: Self-pay | Admitting: Family Medicine

## 2013-08-26 ENCOUNTER — Ambulatory Visit (INDEPENDENT_AMBULATORY_CARE_PROVIDER_SITE_OTHER): Payer: Medicare Other | Admitting: Family Medicine

## 2013-08-26 VITALS — BP 120/78 | HR 86 | Temp 98.3°F | Ht 60.5 in | Wt 182.8 lb

## 2013-08-26 DIAGNOSIS — J019 Acute sinusitis, unspecified: Secondary | ICD-10-CM

## 2013-08-26 DIAGNOSIS — J441 Chronic obstructive pulmonary disease with (acute) exacerbation: Secondary | ICD-10-CM

## 2013-08-26 MED ORDER — AMOXICILLIN 500 MG PO CAPS: 1000.0000 mg | ORAL_CAPSULE | Freq: Two times a day (BID) | ORAL | Status: DC

## 2013-08-26 MED ORDER — ALBUTEROL SULFATE HFA 108 (90 BASE) MCG/ACT IN AERS: 2.0000 | INHALATION_SPRAY | Freq: Four times a day (QID) | RESPIRATORY_TRACT | Status: DC | PRN

## 2013-08-26 NOTE — Progress Notes (Signed)
   Date:  08/26/2013   Name:  Susan [name]   DOB:  1941/04/09   MRN:  [id number] Gender: female Age: 73 y.o.  Primary Physician:  Eliezer Lofts, MD   Chief Complaint: chest congestion, Cough and Fever   Subjective:   History of Present Illness:  Susan [name] is a 73 y.o. very pleasant female patient who presents with the following:  1 week, chest has gotten bad, coughing. Some "gurgling." No wheezing. Some sinus pain worsening over the last few days. No fever. Coughing a lot. Mucinex, sudafed.  Spiriva - needs samples.   Past Medical History, Surgical History, Social History, Family History, Problem List, Medications, and Allergies have been reviewed and updated if relevant.  Review of Systems: ROS: GEN: Acute illness details above GI: Tolerating PO intake GU: maintaining adequate hydration and urination Pulm: as above Interactive and getting along well at home.  Otherwise, ROS is as per the HPI.   Objective:   Physical Examination: BP 120/78  Pulse 86  Temp(Src) 98.3 F (36.8 C) (Oral)  Ht 5' 0.5" (1.537 m)  Wt 182 lb 12 oz (82.895 kg)  BMI 35.09 kg/m2  SpO2 96%   GEN: A and O x 3. WDWN. NAD.    ENT: Nose clear, ext NML.  No LAD.  No JVD.  TM's clear. Oropharynx clear. Sinus max ttp b PULM: Normal WOB, no distress. No crackles, rare wheezes, no rhonchi. CV: RRR, no M/G/R, No rubs, No JVD.   EXT: warm and well-perfused, No c/c/e. PSYCH: Pleasant and conversant.    Laboratory and Imaging Data:  Assessment & Plan:   Sinusitis, acute  COPD exacerbation  Mild copd flare with sinusitis Amox, refill albuterol  No Follow-up on file.  No orders of the defined types were placed in this encounter.   Patient's Medications  New Prescriptions   AMOXICILLIN (AMOXIL) 500 MG CAPSULE    Take 2 capsules (1,000 mg total) by mouth 2 (two) times daily.  Previous Medications   ROPINIROLE (REQUIP) 3 MG TABLET    TAKE ONE TABLET AT BEDTIME   TIOTROPIUM (SPIRIVA)  18 MCG INHALATION CAPSULE    Place 1 capsule (18 mcg total) into inhaler and inhale daily.   VALACYCLOVIR (VALTREX) 500 MG TABLET    Take 500 mg by mouth 2 (two) times daily as needed.  Modified Medications   Modified Medication Previous Medication   ALBUTEROL (PROVENTIL HFA;VENTOLIN HFA) 108 (90 BASE) MCG/ACT INHALER albuterol (PROVENTIL HFA;VENTOLIN HFA) 108 (90 BASE) MCG/ACT inhaler      Inhale 2 puffs into the lungs every 6 (six) hours as needed for wheezing.    Inhale 2 puffs into the lungs every 6 (six) hours as needed for wheezing.   TRAZODONE (DESYREL) 50 MG TABLET traZODone (DESYREL) 50 MG tablet      Take 100 mg by mouth at bedtime as needed.    TAKE ONE AND ONE-HALF TABLETS AT BEDTIME. AS NEEDED FOR SLEEP  Discontinued Medications   No medications on file   There are no Patient Instructions on file for this visit.  Signed,  Maud Deed. Annaleah Arata, MD, [place] at [facility] [place] [address] Phone: [phone] Fax: [phone]

## 2013-08-26 NOTE — Progress Notes (Signed)
Pre visit review using our clinic review tool, if applicable. No additional management support is needed unless otherwise documented below in the visit note. 

## 2013-08-27 ENCOUNTER — Telehealth: Payer: Self-pay | Admitting: Pulmonary Disease

## 2013-08-27 MED ORDER — TIOTROPIUM BROMIDE MONOHYDRATE 18 MCG IN CAPS: 18.0000 ug | ORAL_CAPSULE | Freq: Every day | RESPIRATORY_TRACT | Status: DC

## 2013-08-27 NOTE — Telephone Encounter (Signed)
Last ov w/ BQ was 11.3.14: Patient Instructions     We will arrange a sleep study for you in [place]  We will set up a CT of your chest to evaluate the pulmonary nodule before you surgery in December  Use spiriva dailiy  Use the Xopenex inhaler as needed for shortness of breath  We will see you back in 6 months or sooner if needed    I do not see in pt's chart where she was given a script for this medication on 11.3.14 (chart says it was printed) and her samples requests are not regular 2 samples obtained for pt with a coupon card Went to lobby and spoke with patient.  Asked her about a prescription.  Pt stated that she has never had a script, "they just normally give me a month's samples at a time."  Advised pt that it would be best for her to have a rx as we cannot always guarantee that we have samples and we do not want her to go without her meds.  Pt has [employer] PPO, so advised pt that rx will be sent in addition to her samples and gave her the coupon card.  Pt okay with this and verbalized her understanding. Rx sent to [facility] in [place] Nothing further needed at this time; will sign off.

## 2013-09-02 ENCOUNTER — Encounter: Payer: Self-pay | Admitting: Family Medicine

## 2013-09-02 ENCOUNTER — Ambulatory Visit (INDEPENDENT_AMBULATORY_CARE_PROVIDER_SITE_OTHER): Payer: Medicare Other | Admitting: Family Medicine

## 2013-09-02 VITALS — BP 120/80 | HR 60 | Temp 98.3°F | Ht 60.5 in | Wt 180.2 lb

## 2013-09-02 DIAGNOSIS — J209 Acute bronchitis, unspecified: Secondary | ICD-10-CM

## 2013-09-02 DIAGNOSIS — J441 Chronic obstructive pulmonary disease with (acute) exacerbation: Secondary | ICD-10-CM

## 2013-09-02 MED ORDER — PREDNISONE 20 MG PO TABS: ORAL_TABLET | ORAL | Status: DC

## 2013-09-02 MED ORDER — DOXYCYCLINE HYCLATE 100 MG PO TABS: 100.0000 mg | ORAL_TABLET | Freq: Two times a day (BID) | ORAL | Status: DC

## 2013-09-02 NOTE — Progress Notes (Signed)
Pre visit review using our clinic review tool, if applicable. No additional management support is needed unless otherwise documented below in the visit note. 

## 2013-09-02 NOTE — Progress Notes (Signed)
Date:  09/02/2013   Name:  Susan [name]   DOB:  [birth year]   MRN:  [id number] Gender: female Age: 73 y.o.  Primary Physician:  Eliezer Lofts, MD   Chief Complaint: Not Feeling Any Better   Subjective:   History of Present Illness:  Susan [name] is a 73 y.o. very pleasant female patient who presents with the following:  Still feeling awful Breathing about the same, some SOB, using albuterol q 4 hours.  Having some gurgling in her chest. A little chest congestion Not much sinus symptoms or drainage   Wheezing now.   08/26/2013 Last OV with Owens Loffler, MD  1 week, chest has gotten bad, coughing. Some "gurgling." No wheezing. Some sinus pain worsening over the last few days. No fever. Coughing a lot. Mucinex, sudafed.  Spiriva - needs samples.   Past Medical History, Surgical History, Social History, Family History, Problem List, Medications, and Allergies have been reviewed and updated if relevant.  Review of Systems: ROS: GEN: Acute illness details above GI: Tolerating PO intake GU: maintaining adequate hydration and urination Pulm: as above Interactive and getting along well at home.  Otherwise, ROS is as per the HPI.   Objective:   Physical Examination: BP 120/80  Pulse 60  Temp(Src) 98.3 F (36.8 C) (Oral)  Ht 5' 0.5" (1.537 m)  Wt 180 lb 4 oz (81.761 kg)  BMI 34.61 kg/m2  SpO2 97%   GEN: A and O x 3. WDWN. NAD.    ENT: Nose clear, ext NML.  No LAD.  No JVD.  TM's clear. Oropharynx clear. Sinus max ttp b PULM: Normal WOB, no distress. No crackles, intermittent wheezes, no rhonchi. CV: RRR, no M/G/R, No rubs, No JVD.   EXT: warm and well-perfused, No c/c/e. PSYCH: Pleasant and conversant.    Laboratory and Imaging Data:  Assessment & Plan:   COPD exacerbation  Acute bronchitis  COPD worsened, add steroids Change ABX to cover atypicals  Patient's Medications  New Prescriptions   DOXYCYCLINE (VIBRA-TABS) 100 MG TABLET    Take 1 tablet  (100 mg total) by mouth 2 (two) times daily.   PREDNISONE (DELTASONE) 20 MG TABLET    2 tabs po daily x 4 days, then 1 tab po daily x 4 days  Previous Medications   ALBUTEROL (PROVENTIL HFA;VENTOLIN HFA) 108 (90 BASE) MCG/ACT INHALER    Inhale 2 puffs into the lungs every 6 (six) hours as needed for wheezing.   ROPINIROLE (REQUIP) 3 MG TABLET    TAKE ONE TABLET AT BEDTIME   TIOTROPIUM (SPIRIVA) 18 MCG INHALATION CAPSULE    Place 1 capsule (18 mcg total) into inhaler and inhale daily.   TRAZODONE (DESYREL) 50 MG TABLET    Take 100 mg by mouth at bedtime as needed.   VALACYCLOVIR (VALTREX) 500 MG TABLET    Take 500 mg by mouth 2 (two) times daily as needed.  Modified Medications   No medications on file  Discontinued Medications   AMOXICILLIN (AMOXIL) 500 MG CAPSULE    Take 2 capsules (1,000 mg total) by mouth 2 (two) times daily.   TIOTROPIUM (SPIRIVA) 18 MCG INHALATION CAPSULE    Place 1 capsule (18 mcg total) into inhaler and inhale daily.   There are no Patient Instructions on file for this visit.  Signed,  Maud Deed. Wael Maestas, MD, [place] at [facility] [place] [address] Phone: [phone] Fax: [phone]

## 2013-09-09 ENCOUNTER — Other Ambulatory Visit: Payer: Self-pay | Admitting: Family Medicine

## 2013-09-09 NOTE — Telephone Encounter (Signed)
Last office visit was 09/02/2013 w/ Dr Lorelei Pont, ok to refill?

## 2013-09-23 ENCOUNTER — Encounter: Payer: Self-pay | Admitting: Family Medicine

## 2013-09-23 ENCOUNTER — Ambulatory Visit (INDEPENDENT_AMBULATORY_CARE_PROVIDER_SITE_OTHER): Payer: Medicare Other | Admitting: Family Medicine

## 2013-09-23 VITALS — BP 122/88 | HR 72 | Temp 98.1°F | Wt 179.5 lb

## 2013-09-23 DIAGNOSIS — J209 Acute bronchitis, unspecified: Secondary | ICD-10-CM

## 2013-09-23 DIAGNOSIS — J441 Chronic obstructive pulmonary disease with (acute) exacerbation: Secondary | ICD-10-CM

## 2013-09-23 MED ORDER — DOXYCYCLINE HYCLATE 100 MG PO TABS: 100.0000 mg | ORAL_TABLET | Freq: Two times a day (BID) | ORAL | Status: DC

## 2013-09-23 MED ORDER — PREDNISONE 20 MG PO TABS: ORAL_TABLET | ORAL | Status: DC

## 2013-09-23 NOTE — Progress Notes (Signed)
Pre visit review using our clinic review tool, if applicable. No additional management support is needed unless otherwise documented below in the visit note. 

## 2013-09-23 NOTE — Progress Notes (Signed)
Date:  09/23/2013   Name:  Susan [name]   DOB:  [birth year]   MRN:  [id number] Gender: female Age: 73 y.o.  Primary Physician:  Eliezer Lofts, MD   Chief Complaint: Cough   Subjective:   History of Present Illness:  Susan [name] is a 73 y.o. very pleasant female patient who presents with the following:  Coughing / congestion. Some SOB - has had recent COPD exab. Compliant with Spiriva. Stopped smoking completely some time ago. No routine albuterol use.   Cough prod of sputum  Using spiriva.  COPD exac.  09/02/2013 Last OV with Owens Loffler, MD  Still feeling awful Breathing about the same, some SOB, using albuterol q 4 hours.  Having some gurgling in her chest. A little chest congestion Not much sinus symptoms or drainage   Wheezing now.   08/26/2013 Last OV with Owens Loffler, MD  1 week, chest has gotten bad, coughing. Some "gurgling." No wheezing. Some sinus pain worsening over the last few days. No fever. Coughing a lot. Mucinex, sudafed.  Spiriva - needs samples.   Past Medical History, Surgical History, Social History, Family History, Problem List, Medications, and Allergies have been reviewed and updated if relevant.  Review of Systems: ROS: GEN: Acute illness details above GI: Tolerating PO intake GU: maintaining adequate hydration and urination Pulm: as above Interactive and getting along well at home.  Otherwise, ROS is as per the HPI.   Objective:   Physical Examination: BP 122/88  Pulse 72  Temp(Src) 98.1 F (36.7 C) (Oral)  Wt 179 lb 8 oz (81.421 kg)  SpO2 95%   GEN: A and O x 3. WDWN. NAD.    ENT: Nose clear, ext NML.  No LAD.  No JVD.  TM's clear. Oropharynx clear. Sinus max ttp b PULM: Normal WOB, no distress. No crackles, intermittent wheezes, no rhonchi. CV: RRR, no M/G/R, No rubs, No JVD.   EXT: warm and well-perfused, No c/c/e. PSYCH: Pleasant and conversant.    Laboratory and Imaging Data:  Assessment & Plan:   COPD  exacerbation  Acute bronchitis  COPD worsened, add steroids ABX to cover atypicals  More likely pure COPD - if sx worsen over time, may consider additional med such as foradil.  Patient's Medications  New Prescriptions   No medications on file  Previous Medications   ACYCLOVIR (ZOVIRAX) 400 MG TABLET    TAKE ONE TABLET TWICE A DAY   ALBUTEROL (PROVENTIL HFA;VENTOLIN HFA) 108 (90 BASE) MCG/ACT INHALER    Inhale 2 puffs into the lungs every 6 (six) hours as needed for wheezing.   ROPINIROLE (REQUIP) 3 MG TABLET    TAKE ONE TABLET AT BEDTIME   TIOTROPIUM (SPIRIVA) 18 MCG INHALATION CAPSULE    Place 1 capsule (18 mcg total) into inhaler and inhale daily.   TRAZODONE (DESYREL) 50 MG TABLET    Take 100 mg by mouth at bedtime as needed.   VALACYCLOVIR (VALTREX) 500 MG TABLET    Take 500 mg by mouth 2 (two) times daily as needed.  Modified Medications   Modified Medication Previous Medication   DOXYCYCLINE (VIBRA-TABS) 100 MG TABLET doxycycline (VIBRA-TABS) 100 MG tablet      Take 1 tablet (100 mg total) by mouth 2 (two) times daily.    Take 1 tablet (100 mg total) by mouth 2 (two) times daily.   PREDNISONE (DELTASONE) 20 MG TABLET predniSONE (DELTASONE) 20 MG tablet      2 tabs po daily x 4  days, then 1 tab po daily x 4 days    2 tabs po daily x 4 days, then 1 tab po daily x 4 days  Discontinued Medications   No medications on file   There are no Patient Instructions on file for this visit.  Signed,  Maud Deed. Obie Kallenbach, MD, [place] at [facility] [place] [address] Phone: [phone] Fax: [phone]

## 2013-10-16 ENCOUNTER — Encounter: Payer: Self-pay | Admitting: Internal Medicine

## 2013-10-21 ENCOUNTER — Encounter: Payer: Self-pay | Admitting: Adult Health

## 2013-10-21 ENCOUNTER — Other Ambulatory Visit: Payer: Self-pay | Admitting: *Deleted

## 2013-10-21 ENCOUNTER — Ambulatory Visit (INDEPENDENT_AMBULATORY_CARE_PROVIDER_SITE_OTHER): Payer: Medicare Other | Admitting: Adult Health

## 2013-10-21 VITALS — BP 142/80 | HR 94 | Temp 98.2°F | Ht 60.0 in | Wt 183.0 lb

## 2013-10-21 DIAGNOSIS — J449 Chronic obstructive pulmonary disease, unspecified: Secondary | ICD-10-CM

## 2013-10-21 MED ORDER — LEVALBUTEROL HCL 0.63 MG/3ML IN NEBU
0.6300 mg | INHALATION_SOLUTION | Freq: Once | RESPIRATORY_TRACT | Status: AC
  Administered 2013-10-21: 0.63 mg via RESPIRATORY_TRACT

## 2013-10-21 MED ORDER — FLUTICASONE PROPIONATE 50 MCG/ACT NA SUSP: 2.0000 | Freq: Every day | NASAL | Status: DC

## 2013-10-21 MED ORDER — PREDNISONE 10 MG PO TABS: ORAL_TABLET | ORAL | Status: DC

## 2013-10-21 MED ORDER — AMOXICILLIN-POT CLAVULANATE 875-125 MG PO TABS: 1.0000 | ORAL_TABLET | Freq: Two times a day (BID) | ORAL | Status: AC

## 2013-10-21 NOTE — Assessment & Plan Note (Addendum)
Flare with bronchitis  xopenex neb x 1   Plan  Augmentin 875mg  Twice daily  For 7 days -take w/ food  Mucinex DM Twice daily As needed  Cough/congestion  Prednisone taper over next week.  Fluids and rest Please contact office for sooner follow up if symptoms do not improve or worsen or seek emergency care  Follow up Dr. Lake Bells as planned and As needed

## 2013-10-21 NOTE — Addendum Note (Signed)
Addended by: Parke Poisson E on: 10/21/2013 12:59 PM   Modules accepted: Orders

## 2013-10-21 NOTE — Patient Instructions (Signed)
Augmentin 875mg  Twice daily  For 7 days -take w/ food  Mucinex DM Twice daily As needed  Cough/congestion  Prednisone taper over next week.  Fluids and rest Please contact office for sooner follow up if symptoms do not improve or worsen or seek emergency care  Follow up Dr. Lake Bells as planned and As needed

## 2013-10-21 NOTE — Progress Notes (Signed)
Subjective:    Patient ID: Susan [name], female    DOB: 08-Apr-1941, 73 y.o.   MRN: [id number]  HPI   Synopsis: Susan [name] first saw the [facility] pulmonary clinic in September 2014 for shortness of breath. She had a previous heavy smoking history but he quit by the time she saw me. Lung function testing was actually not consistent with obstruction but she did have emphysema on her chest CT. Spiriva was started empirically for that. She also had a home sleep study which showed an apnea hypopnea index of 44.   03/25/2013 ROV >  Susan [name] says that her breathing is better sice the last visit.  She feels that the pressure from the machine is too nhight most of the time.  She supposed to have R foot surgery in [place] soon.  She is set to have this on 05/17/2013.  She still doesn't coug a lot.  She previously had bronchitis but she doesn't have it any more.  She feels that the albuterol inhaler was making her have night sweats.  The spiriva really helps with the shortness of breath.  She has a hard time paying for it.   .  10/21/2013 Acute OV (Hx of Mild COPD , OSA , Diastoic Dysfxn, pulm nodules -[facility] pt )  Patient presents to office for an acute office visit. She complains of chest congestion, wheezing, increased SOB, head congestion w/ PND x2 weeks.  Has very thick mucus and difficult to get up  She denies any chest pain, hemoptysis, orthopnea, PND, or leg swelling.  Was on Doxycycline 1 month ago.      Review of Systems Constitutional:   No  weight loss, night sweats,  Fevers, chills, fatigue, or  lassitude.  HEENT:   No headaches,  Difficulty swallowing,  Tooth/dental problems, or  Sore throat,                No sneezing, itching, ear ache,  +nasal congestion, post nasal drip,   CV:  No chest pain,  Orthopnea, PND, swelling in lower extremities, anasarca, dizziness, palpitations, syncope.   GI  No heartburn, indigestion, abdominal pain, nausea, vomiting, diarrhea, change  in bowel habits, loss of appetite, bloody stools.   Resp:  No coughing up of blood.  No chest wall deformity  Skin: no rash or lesions.  GU: no dysuria, change in color of urine, no urgency or frequency.  No flank pain, no hematuria   MS:  No joint pain or swelling.  No decreased range of motion.  No back pain.  Psych:  No change in mood or affect. No depression or anxiety.  No memory loss.         Objective:   Physical Exam GEN: A/Ox3; pleasant , NAD, well nourished   HEENT:  [place]/AT,  EACs-clear, TMs-wnl, NOSE-clear drainage , THROAT-clear, no lesions, no postnasal drip or exudate noted.   NECK:  Supple w/ fair ROM; no JVD; normal carotid impulses w/o bruits; no thyromegaly or nodules palpated; no lymphadenopathy.  RESP  Exp wheezing noted no accessory muscle use, no dullness to percussion  CARD:  RRR, no m/r/g  , no peripheral edema, pulses intact, no cyanosis or clubbing.  GI:   Soft & nt; nml bowel sounds; no organomegaly or masses detected.  Musco: Warm bil, no deformities or joint swelling noted.   Neuro: alert, no focal deficits noted.    Skin: Warm, no lesions or rashes  Assessment & Plan:

## 2013-10-22 ENCOUNTER — Ambulatory Visit (INDEPENDENT_AMBULATORY_CARE_PROVIDER_SITE_OTHER): Payer: Medicare Other | Admitting: Family Medicine

## 2013-10-22 ENCOUNTER — Encounter: Payer: Self-pay | Admitting: Family Medicine

## 2013-10-22 VITALS — BP 136/82 | HR 76 | Temp 98.1°F | Ht 60.0 in | Wt 182.5 lb

## 2013-10-22 DIAGNOSIS — R21 Rash and other nonspecific skin eruption: Secondary | ICD-10-CM | POA: Insufficient documentation

## 2013-10-22 DIAGNOSIS — J449 Chronic obstructive pulmonary disease, unspecified: Secondary | ICD-10-CM | POA: Insufficient documentation

## 2013-10-22 DIAGNOSIS — J441 Chronic obstructive pulmonary disease with (acute) exacerbation: Secondary | ICD-10-CM

## 2013-10-22 MED ORDER — TRIAMCINOLONE ACETONIDE 0.5 % EX CREA: 1.0000 "application " | TOPICAL_CREAM | Freq: Two times a day (BID) | CUTANEOUS | Status: DC

## 2013-10-22 NOTE — Progress Notes (Signed)
   Subjective:    Patient ID: Susan [name], female    DOB: 1940/11/14, 73 y.o.   MRN: [id number]  HPI  72 year old female presents with continued SOB and  COPD flare Started on augmentin, prednisone yesterday. She has noted improvement since yesterday. She has been on 3 courses of  antibiotics and prednsione. She wonders if she needs CXR.   She has itchy  Red rash next to wound for 2 months.  Scar from past foot surgery healed 04/2013. Had wound vac for several weeks. Wound looked good then.  She has been using antifungal cream 3 times a day for weeks without relief.  She puts triple antibitoic ointment.    Review of Systems  Constitutional: Negative for fever and fatigue.  HENT: Negative for ear pain.   Eyes: Negative for pain.  Respiratory: Positive for cough, shortness of breath and wheezing. Negative for chest tightness.   Cardiovascular: Negative for chest pain, palpitations and leg swelling.  Gastrointestinal: Negative for abdominal pain.  Genitourinary: Negative for dysuria.       Objective:   Physical Exam  Constitutional: Vital signs are normal. She appears well-developed and well-nourished. She is cooperative.  Non-toxic appearance. She does not appear ill. No distress.  HENT:  Head: Normocephalic.  Right Ear: Hearing, tympanic membrane, external ear and ear canal normal. Tympanic membrane is not erythematous, not retracted and not bulging.  Left Ear: Hearing, tympanic membrane, external ear and ear canal normal. Tympanic membrane is not erythematous, not retracted and not bulging.  Nose: No mucosal edema or rhinorrhea. Right sinus exhibits no maxillary sinus tenderness and no frontal sinus tenderness. Left sinus exhibits no maxillary sinus tenderness and no frontal sinus tenderness.  Mouth/Throat: Uvula is midline, oropharynx is clear and moist and mucous membranes are normal.  Eyes: Conjunctivae, EOM and lids are normal. Pupils are equal, round, and reactive to  light. Lids are everted and swept, no foreign bodies found.  Neck: Trachea normal and normal range of motion. Neck supple. Carotid bruit is not present. No mass and no thyromegaly present.  Cardiovascular: Normal rate, regular rhythm, S1 normal, S2 normal, normal heart sounds, intact distal pulses and normal pulses.  Exam reveals no gallop and no friction rub.   No murmur heard. Pulmonary/Chest: Effort normal and breath sounds normal. Not tachypneic. No respiratory distress. She has no decreased breath sounds. She has no wheezes. She has no rhonchi. She has no rales.  Occ cough and forced expiratory wheeze, no wheeze on chest exam.  Abdominal: Soft. Normal appearance and bowel sounds are normal. There is no tenderness.  Neurological: She is alert.  Skin: Skin is warm, dry and intact. No rash noted.  Erythema and warmth in 3 inch diameter plaque around wound site.Flaky patches  Psychiatric: Her speech is normal and behavior is normal. Judgment and thought content normal. Her mood appears not anxious. Cognition and memory are normal. She does not exhibit a depressed mood.          Assessment & Plan:

## 2013-10-22 NOTE — Patient Instructions (Signed)
Call here or pulm if not continuing to improve with lung infeciton symtpoms.  Complete antibitoics and steroids.  Start topical steroid.  Call if not improving in 2 weeks, but likely will take longer to resolve.

## 2013-10-22 NOTE — Assessment & Plan Note (Signed)
Improving on augmentin and prednsione.  No indication at this time for CXR.

## 2013-10-22 NOTE — Progress Notes (Signed)
Pre visit review using our clinic review tool, if applicable. No additional management support is needed unless otherwise documented below in the visit note. 

## 2013-10-22 NOTE — Assessment & Plan Note (Signed)
Doubt bacterial infeciton as she has been on multipel antibitoics for lungs without improvement.  Neg KOH and unresponsive to fungal treatment.  Start topical steroid cream .  Follow up if not improving.

## 2013-10-24 ENCOUNTER — Telehealth: Payer: Self-pay

## 2013-10-24 NOTE — Telephone Encounter (Signed)
We have one in stock.  Will have Dee to order.

## 2013-10-24 NOTE — Telephone Encounter (Signed)
Can you make sure that we have some SYNVISC-ONE stocked?   If we only have one, can you go ahead and order another one.

## 2013-10-24 NOTE — Telephone Encounter (Signed)
Pt left v/m; pt already has appt on 10/28/13 at 11:30 with Dr Lorelei Pont for injection in knee; pt wants note sent to Dr Lorelei Pont to let him be aware pt is coming to appt on 10/28/13 and he can decide what type shot pt should receive. Unable to reach pt by phone for more info.

## 2013-10-28 ENCOUNTER — Ambulatory Visit (INDEPENDENT_AMBULATORY_CARE_PROVIDER_SITE_OTHER): Payer: Medicare Other | Admitting: Family Medicine

## 2013-10-28 ENCOUNTER — Ambulatory Visit (INDEPENDENT_AMBULATORY_CARE_PROVIDER_SITE_OTHER)
Admission: RE | Admit: 2013-10-28 | Discharge: 2013-10-28 | Disposition: A | Discharge status: Home / Self Care | Payer: Medicare Other | Source: Ambulatory Visit | Attending: Family Medicine | Admitting: Family Medicine

## 2013-10-28 ENCOUNTER — Encounter: Payer: Self-pay | Admitting: Family Medicine

## 2013-10-28 VITALS — BP 120/72 | HR 84 | Temp 97.8°F | Ht 60.0 in | Wt 182.5 lb

## 2013-10-28 DIAGNOSIS — M1710 Unilateral primary osteoarthritis, unspecified knee: Secondary | ICD-10-CM

## 2013-10-28 DIAGNOSIS — M1711 Unilateral primary osteoarthritis, right knee: Secondary | ICD-10-CM

## 2013-10-28 DIAGNOSIS — IMO0002 Reserved for concepts with insufficient information to code with codable children: Secondary | ICD-10-CM

## 2013-10-28 NOTE — Progress Notes (Signed)
Pre visit review using our clinic review tool, if applicable. No additional management support is needed unless otherwise documented below in the visit note. 

## 2013-10-28 NOTE — Progress Notes (Signed)
[place] [address] Phone: [phone] Fax: [phone]  Patient ID: Susan [name] MRN: [id number], DOB: 02-21-1941, 73 y.o. Date of Encounter: 10/28/2013  Primary Physician:  Eliezer Lofts, MD   Chief Complaint: Knee Injection   Subjective:   History of Present Illness:  Susan [name] is a 73 y.o. very pleasant female patient Body mass index is 35.64 kg/(m^2).  who presents with the following:  R knee. Ongoing severe osteoarthritis of the right knee with failure of multiple medications. Most recently I injected her knee on the right with corticosteroid on 08/19/2013. She did have relief of some symptoms, but it was relatively short lived certainly less than 2 months. She also is been on oral prednisone for pulmonary complaints without much significant impact on her joints.  Shoulder. Severe glenohumeral arthritis. The last injected her shoulder on 08/19/2013.  Past Medical History, Surgical History, Social History, Family History, Problem List, Medications, and Allergies have been reviewed and updated if relevant.  Review of Systems:  GEN: No fevers, chills. Nontoxic. Primarily MSK c/o today. MSK: Detailed in the HPI GI: tolerating PO intake without difficulty Neuro: No numbness, parasthesias, or tingling associated. Otherwise the pertinent positives of the ROS are noted above.   Objective:   Physical Examination: BP 120/72  Pulse 84  Temp(Src) 97.8 F (36.6 C) (Oral)  Ht 5' (1.524 m)  Wt 182 lb 8 oz (82.781 kg)  BMI 35.64 kg/m2   GEN: WDWN, NAD, Non-toxic, Alert & Oriented x 3 HEENT: Atraumatic, Normocephalic.  Ears and Nose: No external deformity. EXTR: No clubbing/cyanosis/edema PSYCH: Normally interactive. Conversant. Not depressed or anxious appearing.  Calm demeanor.    Right knee with range of motion from 0-110. Tender on the medial and lateral joint line. Stable to varus and valgus stress. Anterior cruciate ligament and PCL are  intact. There is tenderness and pain with McMurray's as well as bounce testing.  Radiology: Dg Knee Ap/lat W/sunrise Right  10/28/2013   CLINICAL DATA:  Osteoarthritis.  EXAM: DG KNEE - 3 VIEWS  COMPARISON:  None.  FINDINGS: There is severe narrowing in the lateral compartment with subchondral sclerosis and osteophytosis. Valgus angulation at the knee joint. No definite joint effusion. Patellofemoral osteophytosis.  IMPRESSION: Osteoarthritis, worst in the lateral compartment.   Electronically Signed   By: Lorin Picket M.D.   On: 10/28/2013 13:24    Assessment & Plan:   Osteoarthritis of right knee - Plan: DG Knee AP/LAT W/Sunrise Right  Osteoarthritis of right knee, Severe  With moderate to severe osteoarthritis and failure of multiple oral and injectable medications, we are going to do a trial of some Synvisc 1.  Knee Injection: Synvisc-One, RIGHT KNEE Patient verbally consented to procedure. Risks (including infection), benefits, and alternatives explained. Sterilely prepped with Chloraprep. Ethyl cholride used for anesthesia, then 7 cc of Lidocaine 1% used for anesthesia in the anteromedial position. Reprepped with Chloraprep.  Anteromedial approach used to inject joint without difficulty, injected with Synvisc-One, 6 mL. No complications with procedure and tolerated well.   Orders Placed This Encounter  Procedures  . DG Knee AP/LAT W/Sunrise Right   Follow-up: No Follow-up on file. Unless noted above, the patient is to follow-up if symptoms worsen. Red flags were reviewed with the patient.  Signed,  Maud Deed. Rudolpho Claxton, MD, [facility]   Discontinued Medications   PREDNISONE (DELTASONE) 10 MG TABLET    4 tabs for 2 days, then 3 tabs for 2 days, 2 tabs for  2 days, then 1 tab for 2 days, then stop   Current Medications at Discharge:   Medication List       This list is accurate as of: 10/28/13  1:44 PM.  Always use your most recent med list.                albuterol 108 (90 BASE) MCG/ACT inhaler  Commonly known as:  PROVENTIL HFA;VENTOLIN HFA  Inhale 2 puffs into the lungs every 6 (six) hours as needed for wheezing.     amoxicillin-clavulanate 875-125 MG per tablet  Commonly known as:  AUGMENTIN  Take 1 tablet by mouth 2 (two) times daily.     fluticasone 50 MCG/ACT nasal spray  Commonly known as:  FLONASE  Place 2 sprays into both nostrils daily.     rOPINIRole 3 MG tablet  Commonly known as:  REQUIP  TAKE ONE TABLET AT BEDTIME     tiotropium 18 MCG inhalation capsule  Commonly known as:  SPIRIVA  Place 1 capsule (18 mcg total) into inhaler and inhale daily.     traZODone 50 MG tablet  Commonly known as:  DESYREL  Take 100 mg by mouth at bedtime as needed.     triamcinolone cream 0.5 %  Commonly known as:  KENALOG  Apply 1 application topically 2 (two) times daily.     valACYclovir 500 MG tablet  Commonly known as:  VALTREX  Take 500 mg by mouth 2 (two) times daily as needed.

## 2013-11-12 ENCOUNTER — Other Ambulatory Visit: Payer: Self-pay | Admitting: Family Medicine

## 2013-11-12 NOTE — Telephone Encounter (Signed)
Last office visit 10/28/2013.  Valtrex 500mg  on medication list.  Refill?

## 2013-12-16 ENCOUNTER — Encounter: Payer: Medicare Other | Admitting: Internal Medicine

## 2013-12-23 ENCOUNTER — Encounter: Payer: Self-pay | Admitting: Family Medicine

## 2013-12-23 ENCOUNTER — Ambulatory Visit (INDEPENDENT_AMBULATORY_CARE_PROVIDER_SITE_OTHER): Payer: Medicare Other | Admitting: Family Medicine

## 2013-12-23 VITALS — BP 140/68 | HR 97 | Temp 98.2°F | Ht 60.0 in | Wt 177.5 lb

## 2013-12-23 DIAGNOSIS — M19019 Primary osteoarthritis, unspecified shoulder: Secondary | ICD-10-CM

## 2013-12-23 DIAGNOSIS — M19011 Primary osteoarthritis, right shoulder: Secondary | ICD-10-CM

## 2013-12-23 MED ORDER — MELOXICAM 15 MG PO TABS: 15.0000 mg | ORAL_TABLET | Freq: Every day | ORAL | Status: DC

## 2013-12-23 NOTE — Progress Notes (Signed)
[place] [address] Phone: [phone] Fax: [phone]  Patient ID: Susan [name] MRN: [id number], DOB: 05-May-1941, 73 y.o. Date of Encounter: 12/23/2013  Primary Physician:  Eliezer Lofts, MD   Chief Complaint: Follow-up   Subjective:   History of Present Illness:  Susan [name] is a 73 y.o. very pleasant female patient who presents with the following:  Right knee doing poorly. Last Synvisc did not really help at all. She is going to have TKA in a few months in [place]  R shoulder has been bothering her a lot, too. Fairing well and able to keep up working. She knows that she has end-stage glenohumeral arthritis, her shoulder is in very poor shape. I have discussed having a total shoulder replacement with her in the past, and she is not ready to do that yet. We have injected her shoulder a number of times, and she seemed to be it get good symptomatic relief from that. Intraarticular.   Past Medical History, Surgical History, Social History, Family History, Problem List, Medications, and Allergies have been reviewed and updated if relevant.  Review of Systems:  GEN: No fevers, chills. Nontoxic. Primarily MSK c/o today. MSK: Detailed in the HPI GI: tolerating PO intake without difficulty Neuro: No numbness, parasthesias, or tingling associated. Otherwise the pertinent positives of the ROS are noted above.   Objective:   Physical Examination: BP 140/68  Pulse 97  Temp(Src) 98.2 F (36.8 C) (Oral)  Ht 5' (1.524 m)  Wt 177 lb 8 oz (80.513 kg)  BMI 34.67 kg/m2   GEN: WDWN, NAD, Non-toxic, Alert & Oriented x 3 HEENT: Atraumatic, Normocephalic.  Ears and Nose: No external deformity. EXTR: No clubbing/cyanosis/edema NEURO: Normal gait.  PSYCH: Normally interactive. Conversant. Not depressed or anxious appearing.  Calm demeanor.    RIGHT shoulder approaches full range of motion. She does lack about 20 of abduction and flexion. There is also loss of  approximately 10-15 of internal and actual range of motion. Her strength is 4+/5 in all directions. Significant crepitus.   Radiology: No results found.  Assessment & Plan:   Glenohumeral arthritis, right  End-stage GH OA. Mobic daily reasonable.   Intrarticular Shoulder Injection, RIGHT Verbal consent was obtained from the patient. Risks including infection explained and contrasted with benefits and alternatives. Patient prepped with Chloraprep and Ethyl Chloride used for anesthesia. An intraarticular shoulder injection was performed using the posterior approach. The patient tolerated the procedure well and had decreased pain post injection. No complications. Injection: 8 cc of Lidocaine 1% and Depo-Medrol 80 mg. Needle: 22 gauge   New Prescriptions   MELOXICAM (MOBIC) 15 MG TABLET    Take 1 tablet (15 mg total) by mouth daily.   Discontinued Medications   ALBUTEROL (PROVENTIL HFA;VENTOLIN HFA) 108 (90 BASE) MCG/ACT INHALER    Inhale 2 puffs into the lungs every 6 (six) hours as needed for wheezing.   FLUTICASONE (FLONASE) 50 MCG/ACT NASAL SPRAY    Place 2 sprays into both nostrils daily.   TRIAMCINOLONE CREAM (KENALOG) 0.5 %    Apply 1 application topically 2 (two) times daily.   VALACYCLOVIR (VALTREX) 500 MG TABLET    Take 500 mg by mouth 2 (two) times daily as needed.   Modified Medications   No medications on file   No orders of the defined types were placed in this encounter.   Follow-up: No Follow-up on file. Unless noted above, the patient is to follow-up if symptoms worsen. Red flags  were reviewed with the patient.  Signed,  Maud Deed. Albirta Rhinehart, MD, [facility]  Current Medications at Discharge:   Medication List       This list is accurate as of: 12/23/13 11:59 PM.  Always use your most recent med list.               acyclovir 400 MG tablet  Commonly known as:  ZOVIRAX  TAKE ONE TABLET TWICE A DAY     meloxicam 15 MG tablet  Commonly known as:   MOBIC  Take 1 tablet (15 mg total) by mouth daily.     rOPINIRole 3 MG tablet  Commonly known as:  REQUIP  TAKE ONE TABLET AT BEDTIME     tiotropium 18 MCG inhalation capsule  Commonly known as:  SPIRIVA  Place 1 capsule (18 mcg total) into inhaler and inhale daily.     traZODone 50 MG tablet  Commonly known as:  DESYREL  Take 100 mg by mouth at bedtime as needed.

## 2013-12-23 NOTE — Progress Notes (Signed)
Pre visit review using our clinic review tool, if applicable. No additional management support is needed unless otherwise documented below in the visit note. 

## 2014-01-16 ENCOUNTER — Other Ambulatory Visit: Payer: Self-pay | Admitting: Family Medicine

## 2014-01-16 NOTE — Telephone Encounter (Signed)
Last office visit 12/23/2013 with Dr. Lorelei Pont.   Not on current medication list.  Ok to refill?

## 2014-03-03 ENCOUNTER — Ambulatory Visit (INDEPENDENT_AMBULATORY_CARE_PROVIDER_SITE_OTHER): Payer: Medicare Other | Admitting: Family Medicine

## 2014-03-03 ENCOUNTER — Encounter: Payer: Self-pay | Admitting: Family Medicine

## 2014-03-03 VITALS — BP 140/80 | HR 98 | Temp 98.1°F | Ht 60.0 in | Wt 180.5 lb

## 2014-03-03 DIAGNOSIS — G8929 Other chronic pain: Secondary | ICD-10-CM

## 2014-03-03 DIAGNOSIS — M170 Bilateral primary osteoarthritis of knee: Secondary | ICD-10-CM

## 2014-03-03 DIAGNOSIS — Z23 Encounter for immunization: Secondary | ICD-10-CM

## 2014-03-03 DIAGNOSIS — R350 Frequency of micturition: Secondary | ICD-10-CM

## 2014-03-03 DIAGNOSIS — M25569 Pain in unspecified knee: Secondary | ICD-10-CM

## 2014-03-03 LAB — POCT URINALYSIS DIPSTICK
Bilirubin, UA: NEGATIVE
Glucose, UA: NEGATIVE
Ketones, UA: NEGATIVE
LEUKOCYTES UA: NEGATIVE
NITRITE UA: NEGATIVE
PH UA: 6
PROTEIN UA: NEGATIVE
Spec Grav, UA: >=1.03
Urobilinogen, UA: 0.2

## 2014-03-03 MED ORDER — TRAMADOL HCL 50 MG PO TABS: 50.0000 mg | ORAL_TABLET | Freq: Four times a day (QID) | ORAL | Status: DC | PRN

## 2014-03-03 MED ORDER — METHYLPREDNISOLONE ACETATE 40 MG/ML IJ SUSP: 80.0000 mg | Freq: Once | INTRAMUSCULAR | Status: DC

## 2014-03-03 MED ORDER — METHYLPREDNISOLONE ACETATE 40 MG/ML IJ SUSP
80.0000 mg | Freq: Once | INTRAMUSCULAR | Status: AC
  Administered 2014-03-03: 80 mg via INTRA_ARTICULAR

## 2014-03-03 NOTE — Progress Notes (Signed)
[place] [address] Phone: [phone] Fax: [phone]  Patient ID: [name] MRN: [id number], DOB: [birth year], 73 y.o. Date of Encounter: 03/03/2014  Primary Physician:  Eliezer Lofts, MD   Chief Complaint: Knee Pain and Urinary Frequency   Subjective:   History of Present Illness:  Susan [name] is a 73 y.o. very pleasant female patient Body mass index is 35.25 kg/(m^2).  who presents with the following:  Patient with known severe OA of the knees B, R > L, failure multiple meds, who is actually set up for T[place] on the [place] in 05/2014 in [place] We tried synvisc-1 in 10/2013, which helped only somewhat.   Also with increased urinary pressure and frequency.  10/28/2013 Last OV with Owens Loffler, MD  R knee. Ongoing severe osteoarthritis of the right knee with failure of multiple medications. Most recently I injected her knee on the right with corticosteroid on 08/19/2013. She did have relief of some symptoms, but it was relatively short lived certainly less than 2 months. She also is been on oral prednisone for pulmonary complaints without much significant impact on her joints.  Shoulder. Severe glenohumeral arthritis. The last injected her shoulder on 08/19/2013.  Past Medical History, Surgical History, Social History, Family History, Problem List, Medications, and Allergies have been reviewed and updated if relevant.  Review of Systems:  GEN: No fevers, chills. Nontoxic. Primarily MSK c/o today. MSK: Detailed in the HPI GI: tolerating PO intake without difficulty Neuro: No numbness, parasthesias, or tingling associated. Otherwise the pertinent positives of the ROS are noted above.   Objective:   Physical Examination: BP 140/80  Pulse 98  Temp(Src) 98.1 F (36.7 C) (Oral)  Ht 5' (1.524 m)  Wt 180 lb 8 oz (81.874 kg)  BMI 35.25 kg/m2   GEN: WDWN, NAD, Non-toxic, Alert & Oriented x 3 HEENT: Atraumatic, Normocephalic.  Ears and Nose: No external  deformity. EXTR: No clubbing/cyanosis/edema PSYCH: Normally interactive. Conversant. Not depressed or anxious appearing.  Calm demeanor.    No CVAT ABD: S, NT, ND, + BS, No rebound, No HSM   Right and LEFT knee with range of motion from 0-110. Tender on the medial and lateral joint line. Stable to varus and valgus stress. Anterior cruciate ligament and PCL are intact. There is tenderness and pain with McMurray's as well as bounce home testing.  Radiology: Dg Knee Ap/lat W/sunrise Right  10/28/2013   CLINICAL DATA:  Osteoarthritis.  EXAM: DG KNEE - 3 VIEWS  COMPARISON:  None.  FINDINGS: There is severe narrowing in the lateral compartment with subchondral sclerosis and osteophytosis. Valgus angulation at the knee joint. No definite joint effusion. Patellofemoral osteophytosis.  IMPRESSION: Osteoarthritis, worst in the lateral compartment.   Electronically Signed   By: Lorin Picket M.D.   On: 10/28/2013 13:24    Results for orders placed in visit on 03/03/14  POCT URINALYSIS DIPSTICK      Result Value Ref Range   Color, UA yellow     Clarity, UA clear     Glucose, UA negative     Bilirubin, UA negative     Ketones, UA negative     Spec Grav, UA >=1.030     Blood, UA trace     pH, UA 6.0     Protein, UA negative     Urobilinogen, UA 0.2     Nitrite, UA negative     Leukocytes, UA Negative       Assessment & Plan:  Primary osteoarthritis of both knees  Urinary frequency - Plan: POCT urinalysis dipstick, Urine culture  Need for prophylactic vaccination against Streptococcus pneumoniae (pneumococcus) - Plan: Pneumococcal conjugate vaccine 13-valent IM  Need for prophylactic vaccination and inoculation against influenza - Plan: Flu Vaccine QUAD 36+ mos IM  Chronic knee pain, unspecified laterality - Plan: methylPREDNISolone acetate (DEPO-MEDROL) injection 80 mg, methylPREDNISolone acetate (DEPO-MEDROL) injection 80 mg, DISCONTINUED: methylPREDNISolone acetate (DEPO-MEDROL)  injection 80 mg  With severe osteoarthritis and failure of multiple oral and injectable medications, s/p synvisc in 10/2013. Scheduled for R TKA in 05/2013.  Check cx, blood only on ua  Knee Injection, RIGHT Patient verbally consented to procedure. Risks (including potential rare risk of infection), benefits, and alternatives explained. Sterilely prepped with Chloraprep. Ethyl cholride used for anesthesia. 8 cc Lidocaine 1% mixed with Depo-Medrol 80 mg injected using the anteromedial approach without difficulty. No complications with procedure and tolerated well. Patient had decreased pain post-injection.   Knee Injection, LEFT Patient verbally consented to procedure. Risks (including potential rare risk of infection), benefits, and alternatives explained. Sterilely prepped with Chloraprep. Ethyl cholride used for anesthesia. 8 cc Lidocaine 1% mixed with Depo-Medrol 80 mg injected using the anteromedial approach without difficulty. No complications with procedure and tolerated well. Patient had decreased pain post-injection.   New Prescriptions   TRAMADOL (ULTRAM) 50 MG TABLET    Take 1-2 tablets (50-100 mg total) by mouth every 6 (six) hours as needed.   Orders Placed This Encounter  Procedures  . Urine culture  . Pneumococcal conjugate vaccine 13-valent IM  . Flu Vaccine QUAD 36+ mos IM  . POCT urinalysis dipstick   Follow-up: No Follow-up on file. Unless noted above, the patient is to follow-up if symptoms worsen. Red flags were reviewed with the patient.  Signed,  Maud Deed. Pierre Cumpton, MD, [place]  Patient's Medications  New Prescriptions   TRAMADOL (ULTRAM) 50 MG TABLET    Take 1-2 tablets (50-100 mg total) by mouth every 6 (six) hours as needed.  Previous Medications   ACYCLOVIR (ZOVIRAX) 400 MG TABLET    TAKE ONE TABLET TWICE A DAY   MELOXICAM (MOBIC) 15 MG TABLET    Take 1 tablet (15 mg total) by mouth daily.   ROPINIROLE (REQUIP) 3 MG TABLET    TAKE ONE TABLET AT  BEDTIME   TIOTROPIUM (SPIRIVA) 18 MCG INHALATION CAPSULE    Place 1 capsule (18 mcg total) into inhaler and inhale daily.   TRAZODONE (DESYREL) 50 MG TABLET    Take 100 mg by mouth at bedtime as needed.   TRIAMCINOLONE CREAM (KENALOG) 0.5 %    APPLY TO AFFECTED AREAS TWICE A DAY  Modified Medications   No medications on file  Discontinued Medications   No medications on file

## 2014-03-03 NOTE — Progress Notes (Signed)
Pre visit review using our clinic review tool, if applicable. No additional management support is needed unless otherwise documented below in the visit note. 

## 2014-03-06 ENCOUNTER — Telehealth: Payer: Self-pay | Admitting: *Deleted

## 2014-03-06 LAB — URINE CULTURE: Colony Count: >=100000

## 2014-03-06 MED ORDER — CIPROFLOXACIN HCL 250 MG PO TABS: 250.0000 mg | ORAL_TABLET | Freq: Two times a day (BID) | ORAL | Status: DC

## 2014-03-06 NOTE — Telephone Encounter (Signed)
Message copied by Carter Kitten on Thu Mar 06, 2014  4:43 PM ------      Message from: Owens Loffler      Created: Thu Mar 06, 2014  4:34 PM       Please call:            Urine culture did show a UTI            cipro 250 mg, 1 po bid, #14             ------

## 2014-03-06 NOTE — Telephone Encounter (Signed)
Ms. Barbuto notified as instructed by telephone.  Cipro 250 mg sent to [facility] as instructed by Dr. Lorelei Pont.

## 2014-03-12 ENCOUNTER — Encounter: Payer: Self-pay | Admitting: Family Medicine

## 2014-03-12 ENCOUNTER — Ambulatory Visit (INDEPENDENT_AMBULATORY_CARE_PROVIDER_SITE_OTHER): Payer: Medicare Other | Admitting: Family Medicine

## 2014-03-12 VITALS — BP 120/82 | HR 86 | Temp 97.8°F | Ht 60.0 in | Wt 179.5 lb

## 2014-03-12 DIAGNOSIS — M25511 Pain in right shoulder: Secondary | ICD-10-CM

## 2014-03-12 DIAGNOSIS — M19011 Primary osteoarthritis, right shoulder: Secondary | ICD-10-CM

## 2014-03-12 MED ORDER — METHYLPREDNISOLONE ACETATE 40 MG/ML IJ SUSP
80.0000 mg | Freq: Once | INTRAMUSCULAR | Status: AC
  Administered 2014-03-12: 80 mg via INTRA_ARTICULAR

## 2014-03-12 NOTE — Progress Notes (Signed)
   Dr. Frederico Hamman T. Rebakah Cokley, MD, [facility] Primary Care and Sports Medicine [place], [address] Phone: [phone] Fax: [phone]  03/12/2014  Patient: Susan [name], MRN: [id number], DOB: 1941-02-02, 73 y.o.  Primary Physician:  Eliezer Lofts, MD  Chief Complaint: Shoulder Pain  Subjective:   Susan [name] is a 73 y.o. very pleasant female patient who presents with the following:  Known endstage R GH OA, has had 2 injections in the last year. Taking mobic and ultram. Still with great difficulty working.   I have discussed total shoulder arthroplasty with her in the past.  Past Medical History, Surgical History, Social History, Family History, Problem List, Medications, and Allergies have been reviewed and updated if relevant.  GEN: No fevers, chills. Nontoxic. Primarily MSK c/o today. MSK: Detailed in the HPI GI: tolerating PO intake without difficulty Neuro: No numbness, parasthesias, or tingling associated. Otherwise the pertinent positives of the ROS are noted above.   Objective:   BP 120/82  Pulse 86  Temp(Src) 97.8 F (36.6 C) (Oral)  Ht 5' (1.524 m)  Wt 179 lb 8 oz (81.421 kg)  BMI 35.06 kg/m2   GEN: WDWN, NAD, Non-toxic, Alert & Oriented x 3 HEENT: Atraumatic, Normocephalic.  Ears and Nose: No external deformity. PSYCH: Normally interactive. Conversant. Not depressed or anxious appearing.  Calm demeanor.    Loss of 35 deg abd and flexion with R shoulder, crepitus. Mild anterior effusion.  str 4+/5  Radiology: No results found.  Assessment and Plan:   Right shoulder pain - Plan: methylPREDNISolone acetate (DEPO-MEDROL) injection 80 mg  Glenohumeral arthritis, right  Intrarticular Shoulder Injection, RIGHT Verbal consent was obtained from the patient. Risks including infection explained and contrasted with benefits and alternatives. Patient prepped with Chloraprep and Ethyl Chloride used for anesthesia. An intraarticular  shoulder injection was performed using the posterior approach. The patient tolerated the procedure well and had decreased pain post injection. No complications. Injection: 8 cc of Lidocaine 1% and Depo-Medrol 80 mg. Needle: 22 gauge   Follow-up: No Follow-up on file.  New Prescriptions   No medications on file   No orders of the defined types were placed in this encounter.    Signed,  Maud Deed. Stephanie Littman, MD   Patient's Medications  New Prescriptions   No medications on file  Previous Medications   ACYCLOVIR (ZOVIRAX) 400 MG TABLET    TAKE ONE TABLET TWICE A DAY   CIPROFLOXACIN (CIPRO) 250 MG TABLET    Take 1 tablet (250 mg total) by mouth 2 (two) times daily.   MELOXICAM (MOBIC) 15 MG TABLET    Take 1 tablet (15 mg total) by mouth daily.   ROPINIROLE (REQUIP) 3 MG TABLET    TAKE ONE TABLET AT BEDTIME   TIOTROPIUM (SPIRIVA) 18 MCG INHALATION CAPSULE    Place 1 capsule (18 mcg total) into inhaler and inhale daily.   TRAMADOL (ULTRAM) 50 MG TABLET    Take 1-2 tablets (50-100 mg total) by mouth every 6 (six) hours as needed.   TRAZODONE (DESYREL) 50 MG TABLET    Take 100 mg by mouth at bedtime as needed.   TRIAMCINOLONE CREAM (KENALOG) 0.5 %    APPLY TO AFFECTED AREAS TWICE A DAY  Modified Medications   No medications on file  Discontinued Medications   No medications on file

## 2014-03-12 NOTE — Progress Notes (Signed)
Pre visit review using our clinic review tool, if applicable. No additional management support is needed unless otherwise documented below in the visit note. 

## 2014-03-18 ENCOUNTER — Other Ambulatory Visit: Payer: Self-pay | Admitting: Family Medicine

## 2014-03-18 MED ORDER — LIDOCAINE 5 % EX PTCH: 1.0000 | MEDICATED_PATCH | CUTANEOUS | Status: DC

## 2014-03-18 NOTE — Telephone Encounter (Signed)
Received fax from [facility] requesting refill for Lidoderm 5% patch.  Looks like originally prescribed by Dr. Sharol Given and addressed to Dr. Lorelei Pont.

## 2014-03-19 ENCOUNTER — Telehealth: Payer: Self-pay

## 2014-03-19 NOTE — Telephone Encounter (Signed)
Pt left v/m; lidocaine patch requires Prior auth; pt request contact [place] to start PA. Spoke with at [facility] and [place] was faxed last night and again this morning.Please advise.

## 2014-03-19 NOTE — Telephone Encounter (Signed)
Prior Authorization completed on CoverMyMeds.

## 2014-03-20 ENCOUNTER — Telehealth: Payer: Self-pay | Admitting: Family Medicine

## 2014-03-20 NOTE — Telephone Encounter (Signed)
Susan [name] notified that Dr. Lorelei Pont is not able to inject the same shoulder this close together.  Advised you usually have to weight 90 days in between steroid injections.

## 2014-03-20 NOTE — Telephone Encounter (Signed)
Not in the same shoulder

## 2014-03-20 NOTE — Telephone Encounter (Signed)
Pt had shot in shoulder on 10/21 and is calling to see if she can get another one on Monday 03/24/14>  [phone]

## 2014-03-20 NOTE — Telephone Encounter (Signed)
Received a call today from [facility] and was informed that shoulder pain in not an indication for Lidocaine Patches so PA was denied.  Ms. Kuper notiifed as well as Financial planner.

## 2014-05-14 ENCOUNTER — Other Ambulatory Visit: Payer: Self-pay | Admitting: *Deleted

## 2014-05-14 MED ORDER — TIOTROPIUM BROMIDE MONOHYDRATE 18 MCG IN CAPS: 18.0000 ug | ORAL_CAPSULE | Freq: Every day | RESPIRATORY_TRACT | Status: DC

## 2014-05-26 ENCOUNTER — Ambulatory Visit: Payer: Medicare Other | Admitting: Internal Medicine

## 2014-06-02 ENCOUNTER — Ambulatory Visit (INDEPENDENT_AMBULATORY_CARE_PROVIDER_SITE_OTHER): Payer: PPO | Admitting: Family Medicine

## 2014-06-02 ENCOUNTER — Ambulatory Visit (INDEPENDENT_AMBULATORY_CARE_PROVIDER_SITE_OTHER)
Admission: RE | Admit: 2014-06-02 | Discharge: 2014-06-02 | Disposition: A | Discharge status: Home / Self Care | Payer: PPO | Source: Ambulatory Visit | Attending: Family Medicine | Admitting: Family Medicine

## 2014-06-02 ENCOUNTER — Ambulatory Visit: Payer: Self-pay | Admitting: Family Medicine

## 2014-06-02 VITALS — BP 162/90 | HR 90 | Temp 97.9°F | Ht 60.0 in | Wt 177.0 lb

## 2014-06-02 DIAGNOSIS — M25562 Pain in left knee: Secondary | ICD-10-CM

## 2014-06-02 NOTE — Progress Notes (Signed)
Pre visit review using our clinic review tool, if applicable. No additional management support is needed unless otherwise documented below in the visit note. 

## 2014-06-02 NOTE — Progress Notes (Signed)
Dr. Frederico Hamman T. Thelma Viana, MD, [facility] Primary Care and Sports Medicine [place], [address] Phone: [phone] Fax: [phone]  06/02/2014  Patient: Susan [name], MRN: [id number], DOB: 25-Nov-1940, 74 y.o.  Primary Physician:  Eliezer Lofts, MD  Chief Complaint: Knee Pain and Foot Pain  Subjective:   Susan [name] is a 74 y.o. very pleasant female patient who presents with the following:  Left knee pain:  No recent x-ray. The patient's LEFT knee is bothering her quite a bit over the last few days.  She has had some mild swelling.  It is hurting her a lot more than it ever has before.  Her RIGHT knee Arty has known end-stage degenerative joint disease, and she was supposed to have a knee replacement quite recently, but she had to cancel this due to some complications with some friends and family members.  She also has bilateral total hip replacements, and she has severe glenohumeral joint arthritis as well as severe degenerative changes in her hands and feet.  Dr. Ouida Sills operated on her RIGHT ankle last year, and she is having more pain in her feet and ankles over the last week or so.  Was supposed to have R TKR but cancelled surgery.   Past Medical History, Surgical History, Social History, Family History, Problem List, Medications, and Allergies have been reviewed and updated if relevant.  GEN: No fevers, chills. Nontoxic. Primarily MSK c/o today. MSK: Detailed in the HPI GI: tolerating PO intake without difficulty Neuro: No numbness, parasthesias, or tingling associated. Otherwise the pertinent positives of the ROS are noted above.   Objective:   BP 162/90 mmHg  Pulse 90  Temp(Src) 97.9 F (36.6 C) (Oral)  Ht 5' (1.524 m)  Wt 177 lb (80.287 kg)  BMI 34.57 kg/m2   GEN: WDWN, NAD, Non-toxic, Alert & Oriented x 3 HEENT: Atraumatic, Normocephalic.  Ears and Nose: No external deformity. EXTR: No clubbing/cyanosis/edema PSYCH: Normally  interactive. Conversant. Not depressed or anxious appearing.  Calm demeanor.    LEFT knee: Notable for a mild effusion.  She has tenderness on the medial and lateral joint line.  ACL, PCL, MCL, and LCL are stable.  McMurray's causes pain.  Flexion pinch causes pain.  Bounce home testing is negative.  No patellar apprehension.  Radiology: Dg Knee Ap/lat W/sunrise Left  06/02/2014   CLINICAL DATA:  Medial left knee pain.  EXAM: DG KNEE - 3 VIEWS  COMPARISON:  None.  FINDINGS: There is no evidence of fracture, dislocation, or joint effusion. There is no evidence of arthropathy or other focal bone abnormality. Soft tissues are unremarkable.  IMPRESSION: Negative.   Electronically Signed   By: Earle Gell M.D.   On: 06/02/2014 15:18    On my interpretation, the patient on weightbearing views appears to have almost complete loss of joint space on the lateral compartment.  Medial compartment is consequently more open than anticipated.  The patellofemoral joint is relatively well-preserved.  No occult fracture.  Overall, at least moderate degree osteoarthritic change greatest in the lateral compartment. Electronically Signed  By: Owens Loffler, MD On: 06/03/2014 2:44 PM   Assessment and Plan:   Left knee pain - Plan: DG Knee AP/LAT W/Sunrise Left, methylPREDNISolone acetate (DEPO-MEDROL) injection 80 mg  I do not think there is an easy solution here.  On LEFT knee to me it appears as if she has got some significant degenerative changes clinically and radiographically.  She may have a degenerative  meniscal tear, but that would not affect management at this point.  She has so many different joints involved, we discussed options, and I think she should at least consider having her total knee on the RIGHT, and then contemplate further action.  She is Arty on high-dose anti-inflammatories, tramadol, and Tylenol.  Recommend ice.  Rest as tolerated.  Intra-articular corticosteroid to try to calm down her acute  knee of knee pain  Knee Injection, LEFT Patient verbally consented to procedure. Risks (including potential rare risk of infection), benefits, and alternatives explained. Sterilely prepped with Chloraprep. Ethyl cholride used for anesthesia. 8 cc Lidocaine 1% mixed with Depo-Medrol 80 mg injected using the anteromedial approach without difficulty. No complications with procedure and tolerated well. Patient had decreased pain post-injection.   Follow-up: No Follow-up on file.  New Prescriptions   No medications on file   Orders Placed This Encounter  Procedures  . DG Knee AP/LAT W/Sunrise Left    Signed,  Jadwiga Faidley T. Recardo Linn, MD   Patient's Medications  New Prescriptions   No medications on file  Previous Medications   ACYCLOVIR (ZOVIRAX) 400 MG TABLET    TAKE ONE TABLET TWICE A DAY   MELOXICAM (MOBIC) 15 MG TABLET    Take 1 tablet (15 mg total) by mouth daily.   ROPINIROLE (REQUIP) 3 MG TABLET    TAKE ONE TABLET AT BEDTIME   TIOTROPIUM (SPIRIVA) 18 MCG INHALATION CAPSULE    Place 1 capsule (18 mcg total) into inhaler and inhale daily.   TRAMADOL (ULTRAM) 50 MG TABLET    Take 1-2 tablets (50-100 mg total) by mouth every 6 (six) hours as needed.   TRAZODONE (DESYREL) 50 MG TABLET    TAKE ONE AND ONE-HALF TABLETS AT BEDTIMEIF NEEDED FOR SLEEP  Modified Medications   No medications on file  Discontinued Medications   CIPROFLOXACIN (CIPRO) 250 MG TABLET    Take 1 tablet (250 mg total) by mouth 2 (two) times daily.   LIDOCAINE (LIDODERM) 5 %    Place 1 patch onto the skin daily. Remove & Discard patch within 12 hours or as directed by MD   TRAZODONE (DESYREL) 50 MG TABLET    Take 100 mg by mouth at bedtime as needed.   TRIAMCINOLONE CREAM (KENALOG) 0.5 %    APPLY TO AFFECTED AREAS TWICE A DAY

## 2014-06-03 ENCOUNTER — Encounter: Payer: Self-pay | Admitting: Family Medicine

## 2014-06-03 MED ORDER — METHYLPREDNISOLONE ACETATE 40 MG/ML IJ SUSP
80.0000 mg | Freq: Once | INTRAMUSCULAR | Status: AC
  Administered 2014-06-02: 80 mg via INTRA_ARTICULAR

## 2014-06-23 ENCOUNTER — Telehealth: Payer: Self-pay | Admitting: Family Medicine

## 2014-06-23 NOTE — Telephone Encounter (Signed)
All records sent to Dr Wynelle Link at [facility] # provided. Patient is trying to schedule her own appt and they wanted Med Records before they would set it up.

## 2014-06-23 NOTE — Telephone Encounter (Signed)
Pt called stating that [facility] dr Gwenith Daily wanted all notes from dr copland faxed to him about both her knees fax [phone] Phone [phone]  They told her she had to call to get records sent

## 2014-06-27 ENCOUNTER — Telehealth: Payer: Self-pay | Admitting: Family Medicine

## 2014-07-16 ENCOUNTER — Other Ambulatory Visit: Payer: Self-pay | Admitting: Family Medicine

## 2014-07-16 NOTE — Telephone Encounter (Signed)
Last office visit 06/02/2014 with Dr. Lorelei Pont.   Last refilled 03/18/2014 for #45 with 1 refill.  Ok to refill?

## 2014-07-18 ENCOUNTER — Telehealth: Payer: Self-pay | Admitting: Pulmonary Disease

## 2014-07-18 NOTE — Telephone Encounter (Signed)
Spoke with pt, advised her that we do not have any samples of spiriva but offered to put patient assistance forms up front for her.  She will have her friend pick these up for her.  Also scheduled pt for appt with BQ as she has not seen BQ since 2014.  Pt scheduled for 3/15.  Nothing further needed at this time.

## 2014-07-28 ENCOUNTER — Ambulatory Visit: Payer: Self-pay | Admitting: Orthopedic Surgery

## 2014-07-28 MED ORDER — SODIUM CHLORIDE 0.9 % IV SOLN: 4.0000 mg | Freq: Once | INTRAVENOUS | Status: AC

## 2014-07-28 MED ORDER — ACETAMINOPHEN 10 MG/ML IV SOLN: 1000.0000 mg | Freq: Once | INTRAVENOUS | Status: AC

## 2014-07-28 MED ORDER — DEXAMETHASONE SODIUM PHOSPHATE 4 MG/ML IJ SOLN: 4.0000 mg | Freq: Once | INTRAMUSCULAR | Status: AC

## 2014-07-28 NOTE — Progress Notes (Signed)
Please put orders in Epic surgery 08-08-14 pre op 08-04-14 Thanks

## 2014-07-31 ENCOUNTER — Encounter: Payer: Self-pay | Admitting: Family Medicine

## 2014-07-31 ENCOUNTER — Ambulatory Visit (INDEPENDENT_AMBULATORY_CARE_PROVIDER_SITE_OTHER): Payer: PPO | Admitting: Family Medicine

## 2014-07-31 ENCOUNTER — Other Ambulatory Visit (HOSPITAL_COMMUNITY): Payer: Self-pay | Admitting: *Deleted

## 2014-07-31 VITALS — BP 165/87 | HR 78 | Temp 97.5°F | Ht 60.0 in | Wt 179.5 lb

## 2014-07-31 DIAGNOSIS — Z01810 Encounter for preprocedural cardiovascular examination: Secondary | ICD-10-CM

## 2014-07-31 DIAGNOSIS — J449 Chronic obstructive pulmonary disease, unspecified: Secondary | ICD-10-CM

## 2014-07-31 DIAGNOSIS — I5189 Other ill-defined heart diseases: Secondary | ICD-10-CM

## 2014-07-31 DIAGNOSIS — R030 Elevated blood-pressure reading, without diagnosis of hypertension: Secondary | ICD-10-CM

## 2014-07-31 DIAGNOSIS — I519 Heart disease, unspecified: Secondary | ICD-10-CM

## 2014-07-31 LAB — TSH: TSH: 1.19 u[IU]/mL (ref 0.35–4.50)

## 2014-07-31 NOTE — Progress Notes (Signed)
74 year old female with history of OSA, diastolic dysfunction and recently diagnosed mild COPD ( cause of DOE) presents for pre-op eval for right knee surgery (right TKA, medical and lateral).  She will go to [facility] for rehab.  Has eval with pulmonologist with Dr. Lake Bells. Started on spiriva.. She has been Doing very well on this. Resolution of DOE.  No chest pain, no palpitations.  She is under a lot of stress. BP Readings from Last 3 Encounters:  07/31/14 165/87  06/02/14 162/90  03/12/14 120/82  She does not check blood pressure at home. No diagnosis of HTN. She is scheduled for CPX in next 3 months,             Has had multiple surgeries in last 5 years... No complicaitons. Had Cardiologist Pre op eval in 2012 and 2014   Stable EKG, nml ECHO in 11/2012.   Review of Systems  Constitutional: Negative for fever and fatigue.  HENT: Positive for congestion. Negative for ear pain.   Using mucinex D  Eyes: Negative for pain.  Respiratory: Negative for cough, shortness of breath and wheezing.  Cardiovascular: Negative for chest pain, palpitations and leg swelling.  Gastrointestinal: Negative for abdominal pain.  Genitourinary: Negative for dysuria.  Neurological: Negative for syncope and headaches.       Objective:   Physical Exam  Constitutional: She is oriented to person, place, and time. Vital signs are normal. She appears well-developed and well-nourished. She is cooperative. Non-toxic appearance. She does not appear ill. No distress.  Using walker  HENT:  Head: Normocephalic.  Right Ear: Hearing, tympanic membrane, external ear and ear canal normal. Tympanic membrane is not erythematous, not retracted and not bulging.  Left Ear: Hearing, tympanic membrane, external ear and ear canal normal. Tympanic membrane is not erythematous, not retracted and not bulging.  Nose: No mucosal edema or rhinorrhea. Right sinus exhibits no maxillary sinus  tenderness and no frontal sinus tenderness. Left sinus exhibits no maxillary sinus tenderness and no frontal sinus tenderness.  Mouth/Throat: Uvula is midline, oropharynx is clear and moist and mucous membranes are normal.  Eyes: Conjunctivae, EOM and lids are normal. Pupils are equal, round, and reactive to light. Lids are everted and swept, no foreign bodies found.  Neck: Trachea normal and normal range of motion. Neck supple. Carotid bruit is not present. No mass and no thyromegaly present.  Cardiovascular: Normal rate, regular rhythm, S1 normal, S2 normal, normal heart sounds, intact distal pulses and normal pulses. Exam reveals no gallop and no friction rub.  No murmur heard. Pulmonary/Chest: Effort normal and breath sounds normal. Not tachypneic. No respiratory distress. She has no decreased breath sounds. She has no wheezes. She has no rhonchi. She has no rales.  Abdominal: Soft. Normal appearance and bowel sounds are normal. There is no tenderness.  Neurological: She is alert and oriented to person, place, and time.  Skin: Skin is warm, dry and intact. No rash noted.  Psychiatric: Her speech is normal and behavior is normal. Judgment and thought content normal. Her mood appears not anxious. Cognition and memory are normal. She does not exhibit a depressed mood.   MSK: deformity in hands, knees and feet from OA.

## 2014-07-31 NOTE — Assessment & Plan Note (Signed)
Will follow BPs at home. Eval with labs for secondary etiology and  end organ damage

## 2014-07-31 NOTE — Progress Notes (Signed)
Pre visit review using our clinic review tool, if applicable. No additional management support is needed unless otherwise documented below in the visit note. 

## 2014-07-31 NOTE — Patient Instructions (Signed)
Follow blood pressure at home today and tommorrow. Call with blood pressure measurements on Friday. Will plan to possibly start a medication for blood pressure if > 140/90.

## 2014-07-31 NOTE — Assessment & Plan Note (Signed)
Minimally invasive surgery.  pt is stable  And is cleared as long as no issues per pulm eval next week.  We will improve her blood pressure if truly elevated prior to surgery.. Likely will start a med.

## 2014-07-31 NOTE — Patient Instructions (Addendum)
Susan [name]  07/31/2014   Your procedure is scheduled on: Friday 08/08/2014  Report to [facility] Main  Entrance and follow signs to               [facility].  Call this number if you have problems the morning of surgery [phone]   Remember:  Do not eat food or drink liquids :After Midnight.     Take these medicines the morning of surgery with A SIP OF WATER: spiriva                               You may not have any metal on your body including hair pins and              piercings  Do not wear jewelry, make-up, lotions, powders or perfumes.             Do not wear nail polish.  Do not shave  48 hours prior to surgery.              Men may shave face and neck.   Do not bring valuables to the hospital. [place].  Contacts, dentures or bridgework may not be worn into surgery.  Leave suitcase in the car. After surgery it may be brought to your room.     Patients discharged the day of surgery will not be allowed to drive home.  Name and phone number of your driver:  Special Instructions: N/A              Please read over the following fact sheets you were given: _____________________________________________________________________             [facility] - Preparing for Surgery Before surgery, you can play an important role.  Because skin is not sterile, your skin needs to be as free of germs as possible.  You can reduce the number of germs on your skin by washing with CHG (chlorahexidine gluconate) soap before surgery.  CHG is an antiseptic cleaner which kills germs and bonds with the skin to continue killing germs even after washing. Please DO NOT use if you have an allergy to CHG or antibacterial soaps.  If your skin becomes reddened/irritated stop using the CHG and inform your nurse when you arrive at Short Stay. Do not shave (including legs and underarms) for at least 48 hours  prior to the first CHG shower.  You may shave your face/neck. Please follow these instructions carefully:  1.  Shower with CHG Soap the night before surgery and the  morning of Surgery.  2.  If you choose to wash your hair, wash your hair first as usual with your  normal  shampoo.  3.  After you shampoo, rinse your hair and body thoroughly to remove the  shampoo.                           4.  Use CHG as you would any other liquid soap.  You can apply chg directly  to the skin and wash                       Gently  with a scrungie or clean washcloth.  5.  Apply the CHG Soap to your body ONLY FROM THE NECK DOWN.   Do not use on face/ open                           Wound or open sores. Avoid contact with eyes, ears mouth and genitals (private parts).                       Wash face,  Genitals (private parts) with your normal soap.             6.  Wash thoroughly, paying special attention to the area where your surgery  will be performed.  7.  Thoroughly rinse your body with warm water from the neck down.  8.  DO NOT shower/wash with your normal soap after using and rinsing off  the CHG Soap.                9.  Pat yourself dry with a clean towel.            10.  Wear clean pajamas.            11.  Place clean sheets on your bed the night of your first shower and do not  sleep with pets. Day of Surgery : Do not apply any lotions/deodorants the morning of surgery.  Please wear clean clothes to the hospital/surgery center.  FAILURE TO FOLLOW THESE INSTRUCTIONS MAY RESULT IN THE CANCELLATION OF YOUR SURGERY PATIENT SIGNATURE_________________________________  NURSE SIGNATURE__________________________________  ________________________________________________________________________   Adam Phenix  An incentive spirometer is a tool that can help keep your lungs clear and active. This tool measures how well you are filling your lungs with each breath. Taking long deep breaths may help reverse  or decrease the chance of developing breathing (pulmonary) problems (especially infection) following:  A long period of time when you are unable to move or be active. BEFORE THE PROCEDURE   If the spirometer includes an indicator to show your best effort, your nurse or respiratory therapist will set it to a desired goal.  If possible, sit up straight or lean slightly forward. Try not to slouch.  Hold the incentive spirometer in an upright position. INSTRUCTIONS FOR USE   Sit on the edge of your bed if possible, or sit up as far as you can in bed or on a chair.  Hold the incentive spirometer in an upright position.  Breathe out normally.  Place the mouthpiece in your mouth and seal your lips tightly around it.  Breathe in slowly and as deeply as possible, raising the piston or the ball toward the top of the column.  Hold your breath for 3-5 seconds or for as long as possible. Allow the piston or ball to fall to the bottom of the column.  Remove the mouthpiece from your mouth and breathe out normally.  Rest for a few seconds and repeat Steps 1 through 7 at least 10 times every 1-2 hours when you are awake. Take your time and take a few normal breaths between deep breaths.  The spirometer may include an indicator to show your best effort. Use the indicator as a goal to work toward during each repetition.  After each set of 10 deep breaths, practice coughing to be sure your lungs are clear. If you have an incision (the cut made at the time of surgery), support  your incision when coughing by placing a pillow or rolled up towels firmly against it. Once you are able to get out of bed, walk around indoors and cough well. You may stop using the incentive spirometer when instructed by your caregiver.  RISKS AND COMPLICATIONS  Take your time so you do not get dizzy or light-headed.  If you are in pain, you may need to take or ask for pain medication before doing incentive spirometry. It is  harder to take a deep breath if you are having pain. AFTER USE  Rest and breathe slowly and easily.  It can be helpful to keep track of a log of your progress. Your caregiver can provide you with a simple table to help with this. If you are using the spirometer at home, follow these instructions: [place] IF:   You are having difficultly using the spirometer.  You have trouble using the spirometer as often as instructed.  Your pain medication is not giving enough relief while using the spirometer.  You develop fever of 100.5 F (38.1 C) or higher. SEEK IMMEDIATE MEDICAL CARE IF:   You cough up bloody sputum that had not been present before.  You develop fever of 102 F (38.9 C) or greater.  You develop worsening pain at or near the incision site. MAKE SURE YOU:   Understand these instructions.  Will watch your condition.  Will get help right away if you are not doing well or get worse. Document Released: 09/19/2006 Document Revised: 08/01/2011 Document Reviewed: 11/20/2006 Exit[employer] Patient Information 2014 [employer], [place].   ________________________________________________________________________  WHAT IS A BLOOD TRANSFUSION? Blood Transfusion Information  A transfusion is the replacement of blood or some of its parts. Blood is made up of multiple cells which provide different functions.  Red blood cells carry oxygen and are used for blood loss replacement.  White blood cells fight against infection.  Platelets control bleeding.  Plasma helps clot blood.  Other blood products are available for specialized needs, such as hemophilia or other clotting disorders. BEFORE THE TRANSFUSION  Who gives blood for transfusions?   Healthy volunteers who are fully evaluated to make sure their blood is safe. This is blood bank blood. Transfusion therapy is the safest it has ever been in the practice of medicine. Before blood is taken from a donor, a complete history  is taken to make sure that person has no history of diseases nor engages in risky social behavior (examples are intravenous drug use or sexual activity with multiple partners). The donor's travel history is screened to minimize risk of transmitting infections, such as malaria. The donated blood is tested for signs of infectious diseases, such as HIV and hepatitis. The blood is then tested to be sure it is compatible with you in order to minimize the chance of a transfusion reaction. If you or a relative donates blood, this is often done in anticipation of surgery and is not appropriate for emergency situations. It takes many days to process the donated blood. RISKS AND COMPLICATIONS Although transfusion therapy is very safe and saves many lives, the main dangers of transfusion include:   Getting an infectious disease.  Developing a transfusion reaction. This is an allergic reaction to something in the blood you were given. Every precaution is taken to prevent this. The decision to have a blood transfusion has been considered carefully by your caregiver before blood is given. Blood is not given unless the benefits outweigh the risks. AFTER THE TRANSFUSION  Right after receiving a blood transfusion, you will usually feel much better and more energetic. This is especially true if your red blood cells have gotten low (anemic). The transfusion raises the level of the red blood cells which carry oxygen, and this usually causes an energy increase.  The nurse administering the transfusion will monitor you carefully for complications. HOME CARE INSTRUCTIONS  No special instructions are needed after a transfusion. You may find your energy is better. Speak with your caregiver about any limitations on activity for underlying diseases you may have. SEEK MEDICAL CARE IF:   Your condition is not improving after your transfusion.  You develop redness or irritation at the intravenous (IV) site. SEEK IMMEDIATE  MEDICAL CARE IF:  Any of the following symptoms occur over the next 12 hours:  Shaking chills.  You have a temperature by mouth above 102 F (38.9 C), not controlled by medicine.  Chest, back, or muscle pain.  People around you feel you are not acting correctly or are confused.  Shortness of breath or difficulty breathing.  Dizziness and fainting.  You get a rash or develop hives.  You have a decrease in urine output.  Your urine turns a dark color or changes to pink, red, or brown. Any of the following symptoms occur over the next 10 days:  You have a temperature by mouth above 102 F (38.9 C), not controlled by medicine.  Shortness of breath.  Weakness after normal activity.  The white part of the eye turns yellow (jaundice).  You have a decrease in the amount of urine or are urinating less often.  Your urine turns a dark color or changes to pink, red, or brown. Document Released: 05/06/2000 Document Revised: 08/01/2011 Document Reviewed: 12/24/2007 [facility] Patient Information 2014 [place], [place].  _______________________________________________________________________

## 2014-08-04 ENCOUNTER — Encounter (HOSPITAL_COMMUNITY)
Admission: RE | Admit: 2014-08-04 | Discharge: 2014-08-04 | Disposition: A | Discharge status: Home / Self Care | Payer: PPO | Source: Ambulatory Visit | Attending: Orthopedic Surgery | Admitting: Orthopedic Surgery

## 2014-08-04 ENCOUNTER — Encounter (HOSPITAL_COMMUNITY): Payer: Self-pay

## 2014-08-04 DIAGNOSIS — Z01812 Encounter for preprocedural laboratory examination: Secondary | ICD-10-CM | POA: Diagnosis not present

## 2014-08-04 HISTORY — DX: Unspecified osteoarthritis, unspecified site: M19.90

## 2014-08-04 LAB — CBC
HCT: 41.7 % (ref 36.0–46.0)
Hemoglobin: 13.5 g/dL (ref 12.0–15.0)
MCH: 29.8 pg (ref 26.0–34.0)
MCHC: 32.4 g/dL (ref 30.0–36.0)
MCV: 92.1 fL (ref 78.0–100.0)
Platelets: 185 10*3/uL (ref 150–400)
RBC: 4.53 MIL/uL (ref 3.87–5.11)
RDW: 13.6 % (ref 11.5–15.5)
WBC: 5.4 10*3/uL (ref 4.0–10.5)

## 2014-08-04 LAB — COMPREHENSIVE METABOLIC PANEL
ALT: 18 U/L (ref 0–35)
AST: 19 U/L (ref 0–37)
Albumin: 4 g/dL (ref 3.5–5.2)
Alkaline Phosphatase: 88 U/L (ref 39–117)
Anion gap: 9 (ref 5–15)
BILIRUBIN TOTAL: 0.9 mg/dL (ref 0.3–1.2)
BUN: 13 mg/dL (ref 6–23)
CALCIUM: 9.4 mg/dL (ref 8.4–10.5)
CHLORIDE: 105 mmol/L (ref 96–112)
CO2: 27 mmol/L (ref 19–32)
Creatinine, Ser: 0.5 mg/dL (ref 0.50–1.10)
GFR calc Af Amer: >90 mL/min (ref >90)
GFR calc non Af Amer: >90 mL/min (ref >90)
Glucose, Bld: 102 mg/dL — ABNORMAL HIGH (ref 70–99)
Potassium: 4.6 mmol/L (ref 3.5–5.1)
SODIUM: 141 mmol/L (ref 135–145)
Total Protein: 7 g/dL (ref 6.0–8.3)

## 2014-08-04 LAB — URINE MICROSCOPIC-ADD ON

## 2014-08-04 LAB — URINALYSIS, ROUTINE W REFLEX MICROSCOPIC
BILIRUBIN URINE: NEGATIVE
Glucose, UA: NEGATIVE mg/dL
Ketones, ur: NEGATIVE mg/dL
Leukocytes, UA: NEGATIVE
NITRITE: POSITIVE — AB
PH: 6 (ref 5.0–8.0)
Protein, ur: NEGATIVE mg/dL
Specific Gravity, Urine: 1.021 (ref 1.005–1.030)
Urobilinogen, UA: 1 mg/dL (ref 0.0–1.0)

## 2014-08-04 LAB — ABO/RH: ABO/RH(D): O POS

## 2014-08-04 LAB — SURGICAL PCR SCREEN
MRSA, PCR: NEGATIVE
STAPHYLOCOCCUS AUREUS: POSITIVE — AB

## 2014-08-04 LAB — PROTIME-INR
INR: 1.07 (ref 0.00–1.49)
PROTHROMBIN TIME: 14 s (ref 11.6–15.2)

## 2014-08-04 LAB — APTT: aPTT: 30 seconds (ref 24–37)

## 2014-08-04 NOTE — Progress Notes (Signed)
ua and micro results faxed to dr swintex fax [phone]

## 2014-08-05 ENCOUNTER — Ambulatory Visit (INDEPENDENT_AMBULATORY_CARE_PROVIDER_SITE_OTHER): Payer: PPO | Admitting: Pulmonary Disease

## 2014-08-05 ENCOUNTER — Encounter: Payer: Self-pay | Admitting: Pulmonary Disease

## 2014-08-05 VITALS — BP 124/84 | HR 93 | Ht 60.0 in | Wt 177.0 lb

## 2014-08-05 DIAGNOSIS — R911 Solitary pulmonary nodule: Secondary | ICD-10-CM

## 2014-08-05 DIAGNOSIS — G4733 Obstructive sleep apnea (adult) (pediatric): Secondary | ICD-10-CM

## 2014-08-05 NOTE — Assessment & Plan Note (Signed)
She has mild obstructive sleep apnea and currently does not have significant symptoms. She has been intolerant of CPAP in the past. At this point I do not think that she needs CPAP therapy but anesthesiology needs to be aware of her obstructive sleep apnea diagnosis at the time of surgery.

## 2014-08-05 NOTE — Assessment & Plan Note (Signed)
She is due for a repeat CT chest to evaluate the 7 mm pulmonary nodule.  Plan: -Repeat CT chest, follow-up afterwards

## 2014-08-05 NOTE — Progress Notes (Signed)
Subjective:    Patient ID: Susan [name], female    DOB: [birth year], 74 y.o.   MRN: [id number]  Synopsis: Susan [name] first saw the [facility] pulmonary clinic in September 2014 for shortness of breath. She had a previous heavy smoking history but he quit by the time she saw me. Lung function testing was actually not consistent with obstruction but she did have emphysema on her chest CT. Spiriva was started empirically for that. She also had a home sleep study which showed an apnea hypopnea index of 44. A follow-up split-night study showed an AHI of only 18, CPAP was titrated to 10 cm of water but she was only able to wear it for approximately 15 minutes. She has had difficulty using CPAP when prescribed in the past.  HPI  Chief Complaint  Patient presents with  . COPD    Breathing is unchanged since last OV. Needs refills on medications.    Susan [name] is supposed to undergo right knee surgery this week.  She feels like her breathing has been good lately and she continues to take Spiriva.  She does not get a lot of exercise but she continues to work 10 hours per day.  She says she tries to sit as much as she can at work.  Her knee is really bothering her.  No problems with sleepiness or fatigue.  She has minimal cough and mucus production. She does not smoke.  Past Medical History  Diagnosis Date  . Diverticulosis of colon (without mention of hemorrhage)   . 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 Acute peptic ulcer, unspecified site, with hemorrhage and perforation, without mention of obstruction   . Chronic airway obstruction, not elsewhere classified   . Ventral hernia   . Hematochezia   . Arthritis      Review of Systems  Constitutional: Negative for fever, chills and fatigue.  HENT: Negative for congestion, nosebleeds, postnasal drip and rhinorrhea.   Respiratory: Negative for cough, shortness of breath and wheezing.   Cardiovascular: Negative for chest pain,  palpitations and leg swelling.       Objective:   Physical Exam  Filed Vitals:   08/05/14 1517 08/05/14 1518  BP:  124/84  Pulse:  93  Height: 5' (1.524 m)   Weight: 177 lb (80.287 kg)   SpO2:  95%  RA  Gen: comfortable HEENT: NCAT, EOMi PULM: CTA B CV: RRR, no mgr AB: BS+, soft, nontender Ext: warm, no edema  June 2014 full pulmonary function test at [facility] ratio 80%, FEV1 1.74 L (101% predicted) total lung capacity is 3.81 L (91% predicted), DLCO 11.0 (56% predicted) May 2014 chest x-ray no acute polyp cardiopulmonary abnormality, questionable hyperinflation 01/2013 CT chest 90mm sub pleural nodule, mild emphysema     Assessment & Plan:   COPD, 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    Plan: -keep taking Spiriva, including day of surgery and in hospital Get out of bed as soon as possible after surgery  -use incentive spirometry after surgery   Solitary pulmonary nodule She is due for a repeat CT chest to evaluate the 7 mm pulmonary nodule.  Plan: -Repeat CT chest, follow-up afterwards   Obstructive sleep apnea She has mild obstructive sleep apnea and currently does not have significant symptoms. She has been intolerant of CPAP in the past. At this point I do not  think that she needs CPAP therapy but anesthesiology needs to be aware of her obstructive sleep apnea diagnosis at the time of surgery.     Updated Medication List Outpatient Encounter Prescriptions as of 08/05/2014  Medication Sig  . acyclovir (ZOVIRAX) 400 MG tablet TAKE ONE TABLET TWICE A DAY  . diphenhydrAMINE (BENADRYL) 25 mg capsule Take 50 mg by mouth at bedtime as needed for allergies or sleep.  . meloxicam (MOBIC) 15 MG tablet Take 1 tablet (15 mg total) by mouth daily.  . Multiple Vitamin (MULTIVITAMIN WITH MINERALS) TABS tablet Take 1 tablet by mouth daily.  Marland Kitchen  rOPINIRole (REQUIP) 3 MG tablet TAKE ONE TABLET AT BEDTIME  . tiotropium (SPIRIVA) 18 MCG inhalation capsule Place 1 capsule (18 mcg total) into inhaler and inhale daily.  . traMADol (ULTRAM) 50 MG tablet Take 1-2 tablets (50-100 mg total) by mouth every 6 (six) hours as needed.  . traZODone (DESYREL) 50 MG tablet TAKE ONE AND ONE-HALF TABLETS AT BEDTIMEIF NEEDED FOR SLEEP

## 2014-08-05 NOTE — Assessment & Plan Note (Signed)
She has significant emphysema but no airflow obstruction on her PFT so her risk for a perioperative pulmonary complication is low.    Plan: -keep taking Spiriva, including day of surgery and in hospital Get out of bed as soon as possible after surgery  -use incentive spirometry after surgery

## 2014-08-05 NOTE — Patient Instructions (Signed)
You are clear for the knee surgery We will order a CT scan of your chest to follow up the pulmnary nodule and see you in 2 months

## 2014-08-07 ENCOUNTER — Ambulatory Visit: Payer: Self-pay | Admitting: Orthopedic Surgery

## 2014-08-07 NOTE — H&P (Signed)
TOTAL KNEE ADMISSION H&P  Patient is being admitted for right total knee arthroplasty.  Subjective:  Chief Complaint:right knee pain.  HPI: Susan [name], 74 y.o. female, has a history of pain and functional disability in the right knee due to arthritis and has failed non-surgical conservative treatments for greater than 12 weeks to includeNSAID's and/or analgesics, corticosteriod injections, flexibility and strengthening excercises, use of assistive devices, weight reduction as appropriate and activity modification.  Onset of symptoms was gradual, starting >10 years ago with gradually worsening course since that time. The patient noted no past surgery on the right knee(s).  Patient currently rates pain in the bilaterally knee(s) at 10 out of 10 with activity. Patient has night pain, worsening of pain with activity and weight bearing, pain that interferes with activities of daily living, pain with passive range of motion and crepitus.  Patient has evidence of subchondral cysts, subchondral sclerosis, periarticular osteophytes, joint subluxation and joint space narrowing by imaging studies.There is no active infection.  Patient Active Problem List   Diagnosis Date Noted  . Elevated blood-pressure reading without diagnosis of hypertension 07/31/2014  . Osteoarthritis of right knee, Severe 10/28/2013  . COPD exacerbation 10/22/2013  . Glenohumeral arthritis, Severe 08/20/2013  . Fracture of superior pubic ramus 04/23/2013  . 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 Solitary pulmonary nodule 02/19/2013  . Chronic knee pain 01/15/2013  . Obstructive sleep apnea 10/11/2012  . Umbilical hernia 68/34/1962  . Urinary incontinence, urge 01/17/2012  . Diastolic dysfunction 22/97/9892  . Preoperative cardiovascular examination 04/21/2011  . DDD (degenerative disc disease) 10/26/2010  . RESTLESS LEG SYNDROME 02/23/2010  . HIP REPLACEMENT, LEFT, HX OF 12/21/2009  . FOOT PAIN, BILATERAL 03/25/2009  .  HYPERCHOLESTEROLEMIA 06/18/2008  . ALLERGIC RHINITIS 05/21/2008  . ANKLE PAIN, BILATERAL 05/21/2008  . DIVERTICULOSIS, COLON 11/26/2007  . HERPES SIMPLEX INFECTION, RECURRENT 10/17/2007   Past Medical History  Diagnosis Date  . Diverticulosis of colon (without mention of hemorrhage)   . 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 Acute peptic ulcer, unspecified site, with hemorrhage and perforation, without mention of obstruction   . Chronic airway obstruction, not elsewhere classified   . Ventral hernia   . Hematochezia   . Arthritis     Past Surgical History  Procedure Laterality Date  . Bunionectomy Bilateral   . Total hip arthroplasty  2007    left  . Appendectomy    . Tubal ligation    . Cosmetic surgery  1970's    abdomen. Tummy Tuck  . Foot surgery Bilateral dec 2015 right     left 3 yrs ago  . Total hip arthroplasty      bilateral      (Not in a hospital admission) No Known Allergies  History  Substance Use Topics  . Smoking status: Former Smoker -- 0.50 packs/day for 40 years    Types: Cigarettes    Quit date: 08/29/2000  . Smokeless tobacco: Never Used  . Alcohol Use: Yes     Comment: rare    Family History  Problem Relation Age of Onset  . Diabetes Mother   . Coronary artery disease Mother   . Lung disease Mother   . Heart disease Mother     CAD  . Coronary artery disease Father   . Kidney disease Father   . Diabetes Father   . Heart disease Father     CAD  . Cancer Neg Hx      Review of Systems  Constitutional:  Negative.   HENT: Negative.   Eyes: Negative.   Respiratory: Positive for shortness of breath.   Cardiovascular: Negative.  Negative for chest pain.  Gastrointestinal: Negative.   Genitourinary: Negative.   Musculoskeletal: Positive for joint pain.       Bilateral knee pain  Skin: Negative.   Neurological: Negative.   Endo/Heme/Allergies: Negative.   Psychiatric/Behavioral: Negative.     Objective:  Physical Exam   Vitals reviewed. Constitutional: She is oriented to person, place, and time. She appears well-developed and well-nourished.  HENT:  Head: Normocephalic and atraumatic.  Eyes: Pupils are equal, round, and reactive to light.  Neck: Normal range of motion. Neck supple.  Cardiovascular: Normal rate, regular rhythm and normal heart sounds.   Respiratory: Effort normal and breath sounds normal.  GI: Soft. Bowel sounds are normal. She exhibits no distension. There is no tenderness.  Genitourinary:  Deferred  Musculoskeletal:       Right knee: She exhibits decreased range of motion, swelling and deformity. Tenderness found. Medial joint line and lateral joint line tenderness noted.  Neurological: She is alert and oriented to person, place, and time.  Skin: Skin is warm and dry.  Psychiatric: She has a normal mood and affect. Her behavior is normal. Judgment and thought content normal.    Vital signs in last 24 hours: @VSRANGES @  Labs:   Estimated body mass index is 34.57 kg/(m^2) as calculated from the following:   Height as of 08/05/14: 5' (1.524 m).   Weight as of 08/05/14: 80.287 kg (177 lb).   Imaging Review Plain radiographs demonstrate severe degenerative joint disease of the bilaterally knee(s). The overall alignment ismild valgus. The bone quality appears to be adequate for age and reported activity level.  Assessment/Plan:  End stage arthritis, right knee   The patient history, physical examination, clinical judgment of the provider and imaging studies are consistent with end stage degenerative joint disease of the right knee(s) and total knee arthroplasty is deemed medically necessary. The treatment options including medical management, injection therapy arthroscopy and arthroplasty were discussed at length. The risks and benefits of total knee arthroplasty were presented and reviewed. The risks due to aseptic loosening, infection, stiffness, patella tracking problems,  thromboembolic complications and other imponderables were discussed. The patient acknowledged the explanation, agreed to proceed with the plan and consent was signed. Patient is being admitted for inpatient treatment for surgery, pain control, PT, OT, prophylactic antibiotics, VTE prophylaxis, progressive ambulation and ADL's and discharge planning. The patient is planning to be discharged home with home health services

## 2014-08-08 ENCOUNTER — Inpatient Hospital Stay (HOSPITAL_COMMUNITY)
Admission: RE | Admit: 2014-08-08 | Discharge: 2014-08-10 | DRG: 470 | Disposition: A | Discharge status: Home Health | Payer: PPO | Source: Ambulatory Visit | Attending: Orthopedic Surgery | Admitting: Orthopedic Surgery

## 2014-08-08 ENCOUNTER — Encounter (HOSPITAL_COMMUNITY)
Admission: RE | Disposition: A | Discharge status: Home Health | Payer: Self-pay | Source: Ambulatory Visit | Attending: Orthopedic Surgery

## 2014-08-08 ENCOUNTER — Inpatient Hospital Stay (HOSPITAL_COMMUNITY): Payer: PPO | Admitting: Anesthesiology

## 2014-08-08 ENCOUNTER — Inpatient Hospital Stay (HOSPITAL_COMMUNITY): Payer: PPO

## 2014-08-08 ENCOUNTER — Encounter (HOSPITAL_COMMUNITY): Payer: Self-pay

## 2014-08-08 DIAGNOSIS — Z01812 Encounter for preprocedural laboratory examination: Secondary | ICD-10-CM | POA: Diagnosis not present

## 2014-08-08 DIAGNOSIS — M1711 Unilateral primary osteoarthritis, right knee: Principal | ICD-10-CM | POA: Diagnosis present

## 2014-08-08 DIAGNOSIS — M25561 Pain in right knee: Secondary | ICD-10-CM | POA: Diagnosis present

## 2014-08-08 DIAGNOSIS — Z87891 Personal history of nicotine dependence: Secondary | ICD-10-CM

## 2014-08-08 DIAGNOSIS — Z09 Encounter for follow-up examination after completed treatment for conditions other than malignant neoplasm: Secondary | ICD-10-CM

## 2014-08-08 HISTORY — PX: TOTAL KNEE ARTHROPLASTY: SHX125

## 2014-08-08 LAB — TYPE AND SCREEN
ABO/RH(D): O POS
Antibody Screen: NEGATIVE

## 2014-08-08 SURGERY — ARTHROPLASTY, KNEE, TOTAL
Anesthesia: General | Site: Knee | Laterality: Right

## 2014-08-08 MED ORDER — SODIUM CHLORIDE 0.9 % IV SOLN
1000.0000 mg | INTRAVENOUS | Status: DC
  Filled 2014-08-08: qty 10

## 2014-08-08 MED ORDER — ONDANSETRON HCL 4 MG PO TABS: 4.0000 mg | ORAL_TABLET | Freq: Four times a day (QID) | ORAL | Status: DC | PRN

## 2014-08-08 MED ORDER — CEFAZOLIN SODIUM-DEXTROSE 2-3 GM-% IV SOLR
2.0000 g | Freq: Four times a day (QID) | INTRAVENOUS | Status: AC
  Administered 2014-08-08 (×2): 2 g via INTRAVENOUS
  Filled 2014-08-08 (×2): qty 50

## 2014-08-08 MED ORDER — TRANEXAMIC ACID 100 MG/ML IV SOLN
1000.0000 mg | INTRAVENOUS | Status: DC | PRN
  Administered 2014-08-08: 1000 mg via INTRAVENOUS

## 2014-08-08 MED ORDER — MEPERIDINE HCL 50 MG/ML IJ SOLN: 6.2500 mg | INTRAMUSCULAR | Status: DC | PRN

## 2014-08-08 MED ORDER — CEFAZOLIN SODIUM-DEXTROSE 2-3 GM-% IV SOLR
2.0000 g | INTRAVENOUS | Status: AC
  Administered 2014-08-08: 2 g via INTRAVENOUS

## 2014-08-08 MED ORDER — ASPIRIN EC 325 MG PO TBEC
325.0000 mg | DELAYED_RELEASE_TABLET | Freq: Two times a day (BID) | ORAL | Status: DC
  Administered 2014-08-08 – 2014-08-10 (×4): 325 mg via ORAL
  Filled 2014-08-08 (×6): qty 1

## 2014-08-08 MED ORDER — SENNA 8.6 MG PO TABS
1.0000 | ORAL_TABLET | Freq: Two times a day (BID) | ORAL | Status: DC
  Administered 2014-08-08 – 2014-08-10 (×5): 8.6 mg via ORAL

## 2014-08-08 MED ORDER — ONDANSETRON HCL 4 MG/2ML IJ SOLN: 4.0000 mg | Freq: Four times a day (QID) | INTRAMUSCULAR | Status: DC | PRN

## 2014-08-08 MED ORDER — ZOLPIDEM TARTRATE 5 MG PO TABS: 5.0000 mg | ORAL_TABLET | Freq: Every evening | ORAL | Status: DC | PRN

## 2014-08-08 MED ORDER — ACETAMINOPHEN 325 MG PO TABS: 650.0000 mg | ORAL_TABLET | Freq: Four times a day (QID) | ORAL | Status: DC | PRN

## 2014-08-08 MED ORDER — ISOPROPYL ALCOHOL 70 % SOLN
Status: DC | PRN
  Administered 2014-08-08: 1 via TOPICAL

## 2014-08-08 MED ORDER — SODIUM CHLORIDE 0.9 % IJ SOLN
INTRAMUSCULAR | Status: AC
  Filled 2014-08-08: qty 50

## 2014-08-08 MED ORDER — SODIUM CHLORIDE 0.9 % IR SOLN
Status: DC | PRN
  Administered 2014-08-08: 3000 mL
  Administered 2014-08-08: 1000 mL

## 2014-08-08 MED ORDER — HYDROMORPHONE HCL 2 MG/ML IJ SOLN
INTRAMUSCULAR | Status: AC
  Filled 2014-08-08: qty 1

## 2014-08-08 MED ORDER — METOCLOPRAMIDE HCL 10 MG PO TABS: 5.0000 mg | ORAL_TABLET | Freq: Three times a day (TID) | ORAL | Status: DC | PRN

## 2014-08-08 MED ORDER — SENNA 8.6 MG PO TABS: 2.0000 | ORAL_TABLET | Freq: Every day | ORAL | Status: DC

## 2014-08-08 MED ORDER — ISOPROPYL ALCOHOL 70 % SOLN
Status: AC
  Filled 2014-08-08: qty 480

## 2014-08-08 MED ORDER — CEFAZOLIN SODIUM-DEXTROSE 2-3 GM-% IV SOLR
INTRAVENOUS | Status: AC
  Filled 2014-08-08: qty 50

## 2014-08-08 MED ORDER — DOCUSATE SODIUM 100 MG PO CAPS
100.0000 mg | ORAL_CAPSULE | Freq: Two times a day (BID) | ORAL | Status: DC
  Administered 2014-08-08 – 2014-08-10 (×5): 100 mg via ORAL

## 2014-08-08 MED ORDER — SODIUM CHLORIDE 0.9 % IV SOLN: INTRAVENOUS | Status: DC

## 2014-08-08 MED ORDER — HYDROMORPHONE HCL 1 MG/ML IJ SOLN: 0.5000 mg | INTRAMUSCULAR | Status: DC | PRN

## 2014-08-08 MED ORDER — ASPIRIN 325 MG PO TBEC: 325.0000 mg | DELAYED_RELEASE_TABLET | Freq: Two times a day (BID) | ORAL | Status: DC

## 2014-08-08 MED ORDER — MELOXICAM 15 MG PO TABS
15.0000 mg | ORAL_TABLET | Freq: Every day | ORAL | Status: DC
  Administered 2014-08-09 – 2014-08-10 (×2): 15 mg via ORAL
  Filled 2014-08-08 (×2): qty 1

## 2014-08-08 MED ORDER — DEXAMETHASONE SODIUM PHOSPHATE 10 MG/ML IJ SOLN
10.0000 mg | Freq: Once | INTRAMUSCULAR | Status: AC
  Administered 2014-08-09: 10 mg via INTRAVENOUS
  Filled 2014-08-08: qty 1

## 2014-08-08 MED ORDER — MENTHOL 3 MG MT LOZG: 1.0000 | LOZENGE | OROMUCOSAL | Status: DC | PRN

## 2014-08-08 MED ORDER — ACETAMINOPHEN 650 MG RE SUPP: 650.0000 mg | Freq: Four times a day (QID) | RECTAL | Status: DC | PRN

## 2014-08-08 MED ORDER — TRAZODONE HCL 50 MG PO TABS
150.0000 mg | ORAL_TABLET | Freq: Every evening | ORAL | Status: DC | PRN
  Administered 2014-08-09: 150 mg via ORAL
  Filled 2014-08-08: qty 3

## 2014-08-08 MED ORDER — HYDROGEN PEROXIDE 3 % EX SOLN
CUTANEOUS | Status: AC
  Filled 2014-08-08: qty 473

## 2014-08-08 MED ORDER — ROPINIROLE HCL 1 MG PO TABS
3.0000 mg | ORAL_TABLET | Freq: Three times a day (TID) | ORAL | Status: DC
  Administered 2014-08-08 – 2014-08-10 (×6): 3 mg via ORAL
  Filled 2014-08-08 (×8): qty 3

## 2014-08-08 MED ORDER — METOCLOPRAMIDE HCL 5 MG/ML IJ SOLN: 5.0000 mg | Freq: Three times a day (TID) | INTRAMUSCULAR | Status: DC | PRN

## 2014-08-08 MED ORDER — MIDAZOLAM HCL 5 MG/5ML IJ SOLN
INTRAMUSCULAR | Status: DC | PRN
  Administered 2014-08-08: 2 mg via INTRAVENOUS

## 2014-08-08 MED ORDER — METHOCARBAMOL 1000 MG/10ML IJ SOLN
500.0000 mg | Freq: Four times a day (QID) | INTRAVENOUS | Status: DC | PRN
  Administered 2014-08-08: 500 mg via INTRAVENOUS
  Filled 2014-08-08 (×2): qty 5

## 2014-08-08 MED ORDER — BUPIVACAINE-EPINEPHRINE (PF) 0.25% -1:200000 IJ SOLN
INTRAMUSCULAR | Status: DC | PRN
  Administered 2014-08-08: 30 mL

## 2014-08-08 MED ORDER — LIDOCAINE HCL (CARDIAC) 20 MG/ML IV SOLN
INTRAVENOUS | Status: DC | PRN
  Administered 2014-08-08: 80 mg via INTRAVENOUS

## 2014-08-08 MED ORDER — METHOCARBAMOL 500 MG PO TABS: 500.0000 mg | ORAL_TABLET | Freq: Four times a day (QID) | ORAL | Status: DC | PRN

## 2014-08-08 MED ORDER — DEXAMETHASONE SODIUM PHOSPHATE 10 MG/ML IJ SOLN
INTRAMUSCULAR | Status: DC | PRN
  Administered 2014-08-08: 10 mg via INTRAVENOUS

## 2014-08-08 MED ORDER — HYDROCODONE-ACETAMINOPHEN 5-325 MG PO TABS
1.0000 | ORAL_TABLET | ORAL | Status: DC | PRN
  Administered 2014-08-08: 2 via ORAL
  Administered 2014-08-08: 1 via ORAL
  Administered 2014-08-09 – 2014-08-10 (×6): 2 via ORAL
  Filled 2014-08-08 (×7): qty 2
  Filled 2014-08-08: qty 1
  Filled 2014-08-08: qty 2

## 2014-08-08 MED ORDER — PREGABALIN 75 MG PO CAPS
75.0000 mg | ORAL_CAPSULE | Freq: Two times a day (BID) | ORAL | Status: DC
  Administered 2014-08-08 – 2014-08-10 (×5): 75 mg via ORAL
  Filled 2014-08-08 (×5): qty 1

## 2014-08-08 MED ORDER — MIDAZOLAM HCL 2 MG/2ML IJ SOLN
INTRAMUSCULAR | Status: AC
  Filled 2014-08-08: qty 2

## 2014-08-08 MED ORDER — LACTATED RINGERS IV SOLN: INTRAVENOUS | Status: DC

## 2014-08-08 MED ORDER — SODIUM CHLORIDE 0.9 % IJ SOLN
INTRAMUSCULAR | Status: DC | PRN
  Administered 2014-08-08: 29 mL

## 2014-08-08 MED ORDER — DEXAMETHASONE SODIUM PHOSPHATE 10 MG/ML IJ SOLN
INTRAMUSCULAR | Status: AC
  Filled 2014-08-08: qty 1

## 2014-08-08 MED ORDER — METHOCARBAMOL 500 MG PO TABS
500.0000 mg | ORAL_TABLET | Freq: Four times a day (QID) | ORAL | Status: DC | PRN
Start: 2014-08-08 — End: 2014-08-10
  Administered 2014-08-08 – 2014-08-10 (×5): 500 mg via ORAL
  Filled 2014-08-08 (×6): qty 1

## 2014-08-08 MED ORDER — ONDANSETRON HCL 4 MG/2ML IJ SOLN
INTRAMUSCULAR | Status: DC | PRN
  Administered 2014-08-08: 4 mg via INTRAVENOUS

## 2014-08-08 MED ORDER — DIPHENHYDRAMINE HCL 25 MG PO CAPS: 50.0000 mg | ORAL_CAPSULE | Freq: Every evening | ORAL | Status: DC | PRN

## 2014-08-08 MED ORDER — PHENYLEPHRINE HCL 10 MG/ML IJ SOLN
INTRAMUSCULAR | Status: DC | PRN
  Administered 2014-08-08: 80 ug via INTRAVENOUS

## 2014-08-08 MED ORDER — LACTATED RINGERS IV SOLN
INTRAVENOUS | Status: DC
  Administered 2014-08-08: 1000 mL via INTRAVENOUS
  Administered 2014-08-09 (×2): via INTRAVENOUS

## 2014-08-08 MED ORDER — HYDROCODONE-ACETAMINOPHEN 5-325 MG PO TABS: 1.0000 | ORAL_TABLET | ORAL | Status: DC | PRN

## 2014-08-08 MED ORDER — ACETAMINOPHEN 10 MG/ML IV SOLN
1000.0000 mg | Freq: Once | INTRAVENOUS | Status: AC
  Filled 2014-08-08: qty 100

## 2014-08-08 MED ORDER — PROMETHAZINE HCL 25 MG/ML IJ SOLN: 6.2500 mg | INTRAMUSCULAR | Status: DC | PRN

## 2014-08-08 MED ORDER — DOCUSATE SODIUM 100 MG PO CAPS: 100.0000 mg | ORAL_CAPSULE | Freq: Two times a day (BID) | ORAL | Status: DC

## 2014-08-08 MED ORDER — PROPOFOL 10 MG/ML IV BOLUS
INTRAVENOUS | Status: DC | PRN
  Administered 2014-08-08: 150 mg via INTRAVENOUS

## 2014-08-08 MED ORDER — LACTATED RINGERS IV SOLN
INTRAVENOUS | Status: DC | PRN
  Administered 2014-08-08 (×2): via INTRAVENOUS

## 2014-08-08 MED ORDER — FENTANYL CITRATE 0.05 MG/ML IJ SOLN
INTRAMUSCULAR | Status: DC | PRN
  Administered 2014-08-08: 50 ug via INTRAVENOUS
  Administered 2014-08-08: 75 ug via INTRAVENOUS
  Administered 2014-08-08: 50 ug via INTRAVENOUS
  Administered 2014-08-08: 75 ug via INTRAVENOUS

## 2014-08-08 MED ORDER — KETOROLAC TROMETHAMINE 30 MG/ML IJ SOLN
INTRAMUSCULAR | Status: DC | PRN
  Administered 2014-08-08: 30 mg

## 2014-08-08 MED ORDER — KETOROLAC TROMETHAMINE 15 MG/ML IJ SOLN
15.0000 mg | Freq: Four times a day (QID) | INTRAMUSCULAR | Status: AC
  Administered 2014-08-08 – 2014-08-09 (×4): 15 mg via INTRAVENOUS
  Filled 2014-08-08 (×4): qty 1

## 2014-08-08 MED ORDER — HYDROMORPHONE HCL 1 MG/ML IJ SOLN: 0.2500 mg | INTRAMUSCULAR | Status: DC | PRN

## 2014-08-08 MED ORDER — LABETALOL HCL 5 MG/ML IV SOLN
INTRAVENOUS | Status: DC | PRN
  Administered 2014-08-08 (×2): 2.5 mg via INTRAVENOUS

## 2014-08-08 MED ORDER — ADULT MULTIVITAMIN W/MINERALS CH
1.0000 | ORAL_TABLET | Freq: Every day | ORAL | Status: DC
  Administered 2014-08-08 – 2014-08-10 (×3): 1 via ORAL
  Filled 2014-08-08 (×3): qty 1

## 2014-08-08 MED ORDER — HYDROGEN PEROXIDE 3 % EX SOLN
CUTANEOUS | Status: DC | PRN
  Administered 2014-08-08: 1

## 2014-08-08 MED ORDER — PROPOFOL 10 MG/ML IV BOLUS
INTRAVENOUS | Status: AC
  Filled 2014-08-08: qty 20

## 2014-08-08 MED ORDER — ACETAMINOPHEN 10 MG/ML IV SOLN
INTRAVENOUS | Status: DC | PRN
  Administered 2014-08-08: 1000 mg via INTRAVENOUS

## 2014-08-08 MED ORDER — HYDROMORPHONE HCL 1 MG/ML IJ SOLN
INTRAMUSCULAR | Status: DC | PRN
  Administered 2014-08-08: .5 mg via INTRAVENOUS
  Administered 2014-08-08: 0.5 mg via INTRAVENOUS
  Administered 2014-08-08 (×2): 1 mg via INTRAVENOUS
  Administered 2014-08-08: 0.5 mg via INTRAVENOUS
  Administered 2014-08-08: .5 mg via INTRAVENOUS

## 2014-08-08 MED ORDER — KETOROLAC TROMETHAMINE 30 MG/ML IJ SOLN
INTRAMUSCULAR | Status: AC
  Filled 2014-08-08: qty 1

## 2014-08-08 MED ORDER — FENTANYL CITRATE 0.05 MG/ML IJ SOLN
INTRAMUSCULAR | Status: AC
  Filled 2014-08-08: qty 5

## 2014-08-08 MED ORDER — BUPIVACAINE-EPINEPHRINE (PF) 0.25% -1:200000 IJ SOLN
INTRAMUSCULAR | Status: AC
  Filled 2014-08-08: qty 30

## 2014-08-08 MED ORDER — ROCURONIUM BROMIDE 100 MG/10ML IV SOLN
INTRAVENOUS | Status: DC | PRN
  Administered 2014-08-08: 45 mg via INTRAVENOUS

## 2014-08-08 MED ORDER — KETOROLAC TROMETHAMINE 15 MG/ML IJ SOLN
INTRAMUSCULAR | Status: AC
  Filled 2014-08-08: qty 1

## 2014-08-08 MED ORDER — ACYCLOVIR 400 MG PO TABS
400.0000 mg | ORAL_TABLET | Freq: Two times a day (BID) | ORAL | Status: DC
Start: 2014-08-08 — End: 2014-08-10
  Administered 2014-08-08 – 2014-08-10 (×5): 400 mg via ORAL
  Filled 2014-08-08 (×6): qty 1

## 2014-08-08 MED ORDER — TIOTROPIUM BROMIDE MONOHYDRATE 18 MCG IN CAPS
18.0000 ug | ORAL_CAPSULE | Freq: Every day | RESPIRATORY_TRACT | Status: DC
  Administered 2014-08-09 – 2014-08-10 (×2): 18 ug via RESPIRATORY_TRACT
  Filled 2014-08-08: qty 5

## 2014-08-08 MED ORDER — PHENOL 1.4 % MT LIQD
1.0000 | OROMUCOSAL | Status: DC | PRN
  Filled 2014-08-08: qty 177

## 2014-08-08 SURGICAL SUPPLY — 58 items
BAG ZIPLOCK 12X15 (MISCELLANEOUS) IMPLANT
BANDAGE ELASTIC 4 VELCRO ST LF (GAUZE/BANDAGES/DRESSINGS) ×2 IMPLANT
BANDAGE ELASTIC 6 VELCRO ST LF (GAUZE/BANDAGES/DRESSINGS) ×2 IMPLANT
BANDAGE ESMARK 6X9 LF (GAUZE/BANDAGES/DRESSINGS) ×1 IMPLANT
BLADE SAW RECIPROCATING 77.5 (BLADE) ×2 IMPLANT
BNDG ESMARK 6X9 LF (GAUZE/BANDAGES/DRESSINGS) ×2
BONE CEMENT SIMPLEX TOBRAMYCIN (Cement) ×3 IMPLANT
CAPT KNEE TOTAL 3 ×2 IMPLANT
CEMENT BONE SIMPLEX TOBRAMYCIN (Cement) ×3 IMPLANT
CHLORAPREP W/TINT 26ML (MISCELLANEOUS) ×4 IMPLANT
CUFF TOURN SGL QUICK 34 (TOURNIQUET CUFF) ×1
CUFF TRNQT CYL 34X4X40X1 (TOURNIQUET CUFF) ×1 IMPLANT
DECANTER SPIKE VIAL GLASS SM (MISCELLANEOUS) ×2 IMPLANT
DRAPE EXTREMITY T 121X128X90 (DRAPE) ×2 IMPLANT
DRAPE POUCH INSTRU U-SHP 10X18 (DRAPES) ×2 IMPLANT
DRAPE U-SHAPE 47X51 STRL (DRAPES) ×2 IMPLANT
DRSG AQUACEL AG ADV 3.5X10 (GAUZE/BANDAGES/DRESSINGS) ×2 IMPLANT
DRSG TEGADERM 4X4.75 (GAUZE/BANDAGES/DRESSINGS) IMPLANT
ELECT REM PT RETURN 9FT ADLT (ELECTROSURGICAL) ×2
ELECTRODE REM PT RTRN 9FT ADLT (ELECTROSURGICAL) ×1 IMPLANT
EVACUATOR 1/8 PVC DRAIN (DRAIN) IMPLANT
FACESHIELD WRAPAROUND (MASK) ×6 IMPLANT
GAUZE SPONGE 4X4 12PLY STRL (GAUZE/BANDAGES/DRESSINGS) ×2 IMPLANT
GLOVE BIO SURGEON STRL SZ8.5 (GLOVE) ×4 IMPLANT
GLOVE BIOGEL PI IND STRL 8.5 (GLOVE) ×1 IMPLANT
GLOVE BIOGEL PI INDICATOR 8.5 (GLOVE) ×1
GOWN SPEC L3 XXLG W/TWL (GOWN DISPOSABLE) ×2 IMPLANT
HANDPIECE INTERPULSE COAX TIP (DISPOSABLE) ×1
HOOD PEEL AWAY FACE SHEILD DIS (HOOD) ×4 IMPLANT
KIT BASIN OR (CUSTOM PROCEDURE TRAY) ×2 IMPLANT
LIQUID BAND (GAUZE/BANDAGES/DRESSINGS) ×2 IMPLANT
MANIFOLD NEPTUNE II (INSTRUMENTS) ×2 IMPLANT
NEEDLE SPNL 18GX3.5 QUINCKE PK (NEEDLE) ×2 IMPLANT
PACK TOTAL JOINT (CUSTOM PROCEDURE TRAY) ×2 IMPLANT
PADDING CAST COTTON 6X4 STRL (CAST SUPPLIES) ×2 IMPLANT
PEN SKIN MARKING BROAD (MISCELLANEOUS) ×2 IMPLANT
POSITIONER SURGICAL ARM (MISCELLANEOUS) ×2 IMPLANT
SAW OSC TIP CART 19.5X105X1.3 (SAW) ×2 IMPLANT
SEALER BIPOLAR AQUA 6.0 (INSTRUMENTS) ×2 IMPLANT
SET HNDPC FAN SPRY TIP SCT (DISPOSABLE) ×1 IMPLANT
SET PAD KNEE POSITIONER (MISCELLANEOUS) ×2 IMPLANT
SOL PREP POV-IOD 4OZ 10% (MISCELLANEOUS) ×2 IMPLANT
SPONGE DRAIN TRACH 4X4 STRL 2S (GAUZE/BANDAGES/DRESSINGS) IMPLANT
SUCTION FRAZIER 12FR DISP (SUCTIONS) ×2 IMPLANT
SUT MNCRL AB 3-0 PS2 18 (SUTURE) ×2 IMPLANT
SUT MON AB 2-0 CT1 36 (SUTURE) ×4 IMPLANT
SUT VIC AB 1 CT1 36 (SUTURE) ×2 IMPLANT
SUT VIC AB 2-0 CT1 27 (SUTURE) ×1
SUT VIC AB 2-0 CT1 TAPERPNT 27 (SUTURE) ×1 IMPLANT
SUT VLOC 180 0 24IN GS25 (SUTURE) ×2 IMPLANT
SYR 50ML LL SCALE MARK (SYRINGE) ×2 IMPLANT
TOWEL OR 17X26 10 PK STRL BLUE (TOWEL DISPOSABLE) ×4 IMPLANT
TOWEL OR NON WOVEN STRL DISP B (DISPOSABLE) ×2 IMPLANT
TOWER CARTRIDGE SMART MIX (DISPOSABLE) IMPLANT
TRAY FOLEY CATH 14FRSI W/METER (CATHETERS) ×2 IMPLANT
WATER STERILE IRR 1500ML POUR (IV SOLUTION) ×2 IMPLANT
WRAP KNEE MAXI GEL POST OP (GAUZE/BANDAGES/DRESSINGS) ×2 IMPLANT
YANKAUER SUCT BULB TIP 10FT TU (MISCELLANEOUS) ×2 IMPLANT

## 2014-08-08 NOTE — Transfer of Care (Signed)
Immediate Anesthesia Transfer of Care Note  Patient: Susan [name]  Procedure(s) Performed: Procedure(s) (LRB): RIGHT TOTAL KNEE ARTHROPLASTY WITH NAVIGATION (Right)  Patient Location: PACU  Anesthesia Type: General  Level of Consciousness: sedated, patient cooperative and responds to stimulation  Airway & Oxygen Therapy: Patient Spontanous Breathing and Patient connected to face mask oxgen  Post-op Assessment: Report given to PACU RN and Post -op Vital signs reviewed and stable  Post vital signs: Reviewed and stable  Complications: No apparent anesthesia complications

## 2014-08-08 NOTE — Discharge Summary (Signed)
Physician Discharge Summary  Patient ID: Susan [name] MRN: [id number] DOB/AGE: February 27, 1941 74 y.o.  Admit date: 08/08/2014 Discharge date: 08/10/2014  Admission Diagnoses:  Primary osteoarthritis of right knee  Discharge Diagnoses:  Principal Problem:   Primary osteoarthritis of right knee   Past Medical History  Diagnosis Date  . Diverticulosis of colon (without mention of hemorrhage)   . 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 Acute peptic ulcer, unspecified site, with hemorrhage and perforation, without mention of obstruction   . Chronic airway obstruction, not elsewhere classified   . Ventral hernia   . Hematochezia   . Arthritis     Surgeries: Procedure(s): RIGHT TOTAL KNEE ARTHROPLASTY WITH NAVIGATION on 08/07/2014 - 08/08/2014   Consultants (if any):    Discharged Condition: Improved  Hospital Course: Susan [name] is an 74 y.o. female who was admitted 08/08/2014 with a diagnosis of Primary osteoarthritis of right knee and went to the operating room on 08/07/2014 - 08/08/2014 and underwent the above named procedures.    She was given perioperative antibiotics:      Anti-infectives    Start     Dose/Rate Route Frequency Ordered Stop   08/08/14 1400  acyclovir (ZOVIRAX) tablet 400 mg  Status:  Discontinued     400 mg Oral 2 times daily 08/08/14 1222 08/10/14 1800   08/08/14 1400  ceFAZolin (ANCEF) IVPB 2 g/50 mL premix     2 g 100 mL/hr over 30 Minutes Intravenous Every 6 hours 08/08/14 1222 08/08/14 2100   08/08/14 0545  ceFAZolin (ANCEF) IVPB 2 g/50 mL premix     2 g 100 mL/hr over 30 Minutes Intravenous On call to O.R. 08/08/14 2297 08/08/14 0735    .  She was given sequential compression devices, early ambulation, and aspirin for DVT prophylaxis.  She benefited maximally from the hospital stay and there were no complications.    Recent vital signs:  Filed Vitals:   08/10/14 1150  BP:   Pulse:   Temp:   Resp: 18    Recent laboratory  studies:  Lab Results  Component Value Date   HGB 11.7* 08/10/2014   HGB 11.1* 08/09/2014   HGB 13.5 08/04/2014   Lab Results  Component Value Date   WBC 9.2 08/10/2014   PLT 187 08/10/2014   Lab Results  Component Value Date   INR 1.07 08/04/2014   Lab Results  Component Value Date   NA 142 08/09/2014   K 3.3* 08/09/2014   CL 104 08/09/2014   CO2 27 08/09/2014   BUN 13 08/09/2014   CREATININE 0.65 08/09/2014   GLUCOSE 113* 08/09/2014    Discharge Medications:     Medication List    TAKE these medications        acetaminophen 500 MG tablet  Commonly known as:  TYLENOL  Take 1,500 mg by mouth every 6 (six) hours as needed for moderate pain.     acyclovir 400 MG tablet  Commonly known as:  ZOVIRAX  TAKE ONE TABLET TWICE A DAY     aspirin 325 MG EC tablet  Take 1 tablet (325 mg total) by mouth 2 (two) times daily after a meal.     diphenhydrAMINE 25 mg capsule  Commonly known as:  BENADRYL  Take 50 mg by mouth at bedtime as needed for allergies or sleep.     docusate sodium 100 MG capsule  Commonly known as:  COLACE  Take 1 capsule (100 mg total) by mouth 2 (  two) times daily.     HYDROcodone-acetaminophen 5-325 MG per tablet  Commonly known as:  NORCO/VICODIN  Take 1-2 tablets by mouth every 4 (four) hours as needed (breakthrough pain).     meloxicam 15 MG tablet  Commonly known as:  MOBIC  Take 1 tablet (15 mg total) by mouth daily.     methocarbamol 500 MG tablet  Commonly known as:  ROBAXIN  Take 1 tablet (500 mg total) by mouth every 6 (six) hours as needed for muscle spasms.     multivitamin with minerals Tabs tablet  Take 1 tablet by mouth daily.     ondansetron 4 MG tablet  Commonly known as:  ZOFRAN  Take 1 tablet (4 mg total) by mouth every 6 (six) hours as needed for nausea.     rOPINIRole 3 MG tablet  Commonly known as:  REQUIP  TAKE ONE TABLET AT BEDTIME     senna 8.6 MG Tabs tablet  Commonly known as:  SENOKOT  Take 2 tablets  (17.2 mg total) by mouth at bedtime.     tiotropium 18 MCG inhalation capsule  Commonly known as:  SPIRIVA  Place 1 capsule (18 mcg total) into inhaler and inhale daily.     traMADol 50 MG tablet  Commonly known as:  ULTRAM  Take 1-2 tablets (50-100 mg total) by mouth every 6 (six) hours as needed.     traZODone 50 MG tablet  Commonly known as:  DESYREL  TAKE ONE AND ONE-HALF TABLETS AT BEDTIMEIF NEEDED FOR SLEEP        Diagnostic Studies: Dg Knee Right Port  08/08/2014   CLINICAL DATA:  Status post knee arthroplasty  EXAM: PORTABLE RIGHT KNEE - 1-2 VIEW  COMPARISON:  October 28, 2013  FINDINGS: Frontal and lateral views were obtained. Patient is status post total knee replacement with femoral and tibial components appearing well-seated. No acute fracture or dislocation. There is a drain in the lateral aspect of the knee joint. Soft tissue air is an expected postoperative finding.  IMPRESSION: Prosthetic components appear well seated. No acute fracture or dislocation.   Electronically Signed   By: Lowella Grip III M.D.   On: 08/08/2014 11:30    Disposition: 01-Home or Self Care  Discharge Instructions    Call MD / Call 911    Complete by:  As directed   If you experience chest pain or shortness of breath, CALL 911 and be transported to the hospital emergency room.  If you develope a fever above 101 F, pus (white drainage) or increased drainage or redness at the wound, or calf pain, call your surgeon's office.     Call MD / Call 911    Complete by:  As directed   If you experience chest pain or shortness of breath, CALL 911 and be transported to the hospital emergency room.  If you develope a fever above 101 F, pus (white drainage) or increased drainage or redness at the wound, or calf pain, call your surgeon's office.     Constipation Prevention    Complete by:  As directed   Drink plenty of fluids.  Prune juice may be helpful.  You may use a stool softener, such as Colace (over the  counter) 100 mg twice a day.  Use MiraLax (over the counter) for constipation as needed.     Constipation Prevention    Complete by:  As directed   Drink plenty of fluids.  Prune juice may be helpful.  You  may use a stool softener, such as Colace (over the counter) 100 mg twice a day.  Use MiraLax (over the counter) for constipation as needed.     Diet - low sodium heart healthy    Complete by:  As directed      Diet - low sodium heart healthy    Complete by:  As directed      Do not put a pillow under the knee. Place it under the heel.    Complete by:  As directed      Increase activity slowly as tolerated    Complete by:  As directed      Increase activity slowly as tolerated    Complete by:  As directed      TED hose    Complete by:  As directed   Use stockings (TED hose) for 3 weeks on both leg(s).  You may remove them at night for sleeping.           Follow-up Information    Follow up with [facility], Horald Pollen, MD. Schedule an appointment as soon as possible for a visit in 2 weeks.   Specialty:  Orthopedic Surgery   Why:  For wound re-check   Contact information:   [address] [place] [address] [phone]       Follow up with [facility]   Why:  Home Health Physical Therapy   Contact information:   [place] [address] [phone]        Signed: Elie Goody 08/10/2014, 6:56 PM

## 2014-08-08 NOTE — Anesthesia Postprocedure Evaluation (Signed)
Anesthesia Post Note  Patient: Susan [name]  Procedure(s) Performed: Procedure(s) (LRB): RIGHT TOTAL KNEE ARTHROPLASTY WITH NAVIGATION (Right)  Anesthesia type: General  Patient location: PACU  Post pain: Pain level controlled  Post assessment: Post-op Vital signs reviewed  Last Vitals:  Filed Vitals:   08/08/14 1115  BP: 129/78  Pulse: 80  Temp:   Resp: 17    Post vital signs: Reviewed  Level of consciousness: sedated  Complications: No apparent anesthesia complications

## 2014-08-08 NOTE — Interval H&P Note (Signed)
History and Physical Interval Note:  08/08/2014 7:15 AM  Susan [name]  has presented today for surgery, with the diagnosis of right knee osteoarthritis  The various methods of treatment have been discussed with the patient and family. After consideration of risks, benefits and other options for treatment, the patient has consented to  Procedure(s): RIGHT TOTAL KNEE ARTHROPLASTY WITH NAVIGATION (Right) as a surgical intervention .  The patient's history has been reviewed, patient examined, no change in status, stable for surgery.  I have reviewed the patient's chart and labs.  Questions were answered to the patient's satisfaction.     Armondo Cech, Horald Pollen

## 2014-08-08 NOTE — H&P (View-Only) (Signed)
TOTAL KNEE ADMISSION H&P  Patient is being admitted for right total knee arthroplasty.  Subjective:  Chief Complaint:right knee pain.  HPI: Susan [name], 74 y.o. female, has a history of pain and functional disability in the right knee due to arthritis and has failed non-surgical conservative treatments for greater than 12 weeks to includeNSAID's and/or analgesics, corticosteriod injections, flexibility and strengthening excercises, use of assistive devices, weight reduction as appropriate and activity modification.  Onset of symptoms was gradual, starting >10 years ago with gradually worsening course since that time. The patient noted no past surgery on the right knee(s).  Patient currently rates pain in the bilaterally knee(s) at 10 out of 10 with activity. Patient has night pain, worsening of pain with activity and weight bearing, pain that interferes with activities of daily living, pain with passive range of motion and crepitus.  Patient has evidence of subchondral cysts, subchondral sclerosis, periarticular osteophytes, joint subluxation and joint space narrowing by imaging studies.There is no active infection.  Patient Active Problem List   Diagnosis Date Noted  . Elevated blood-pressure reading without diagnosis of hypertension 07/31/2014  . Osteoarthritis of right knee, Severe 10/28/2013  . COPD exacerbation 10/22/2013  . Glenohumeral arthritis, Severe 08/20/2013  . Fracture of superior pubic ramus 04/23/2013  . 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 Solitary pulmonary nodule 02/19/2013  . Chronic knee pain 01/15/2013  . Obstructive sleep apnea 10/11/2012  . Umbilical hernia 64/68/0321  . Urinary incontinence, urge 01/17/2012  . Diastolic dysfunction 22/48/2500  . Preoperative cardiovascular examination 04/21/2011  . DDD (degenerative disc disease) 10/26/2010  . RESTLESS LEG SYNDROME 02/23/2010  . HIP REPLACEMENT, LEFT, HX OF 12/21/2009  . FOOT PAIN, BILATERAL 03/25/2009  .  HYPERCHOLESTEROLEMIA 06/18/2008  . ALLERGIC RHINITIS 05/21/2008  . ANKLE PAIN, BILATERAL 05/21/2008  . DIVERTICULOSIS, COLON 11/26/2007  . HERPES SIMPLEX INFECTION, RECURRENT 10/17/2007   Past Medical History  Diagnosis Date  . Diverticulosis of colon (without mention of hemorrhage)   . 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 Acute peptic ulcer, unspecified site, with hemorrhage and perforation, without mention of obstruction   . Chronic airway obstruction, not elsewhere classified   . Ventral hernia   . Hematochezia   . Arthritis     Past Surgical History  Procedure Laterality Date  . Bunionectomy Bilateral   . Total hip arthroplasty  2007    left  . Appendectomy    . Tubal ligation    . Cosmetic surgery  1970's    abdomen. Tummy Tuck  . Foot surgery Bilateral dec 2015 right     left 3 yrs ago  . Total hip arthroplasty      bilateral      (Not in a hospital admission) No Known Allergies  History  Substance Use Topics  . Smoking status: Former Smoker -- 0.50 packs/day for 40 years    Types: Cigarettes    Quit date: 08/29/2000  . Smokeless tobacco: Never Used  . Alcohol Use: Yes     Comment: rare    Family History  Problem Relation Age of Onset  . Diabetes Mother   . Coronary artery disease Mother   . Lung disease Mother   . Heart disease Mother     CAD  . Coronary artery disease Father   . Kidney disease Father   . Diabetes Father   . Heart disease Father     CAD  . Cancer Neg Hx      Review of Systems  Constitutional:  Negative.   HENT: Negative.   Eyes: Negative.   Respiratory: Positive for shortness of breath.   Cardiovascular: Negative.  Negative for chest pain.  Gastrointestinal: Negative.   Genitourinary: Negative.   Musculoskeletal: Positive for joint pain.       Bilateral knee pain  Skin: Negative.   Neurological: Negative.   Endo/Heme/Allergies: Negative.   Psychiatric/Behavioral: Negative.     Objective:  Physical Exam   Vitals reviewed. Constitutional: She is oriented to person, place, and time. She appears well-developed and well-nourished.  HENT:  Head: Normocephalic and atraumatic.  Eyes: Pupils are equal, round, and reactive to light.  Neck: Normal range of motion. Neck supple.  Cardiovascular: Normal rate, regular rhythm and normal heart sounds.   Respiratory: Effort normal and breath sounds normal.  GI: Soft. Bowel sounds are normal. She exhibits no distension. There is no tenderness.  Genitourinary:  Deferred  Musculoskeletal:       Right knee: She exhibits decreased range of motion, swelling and deformity. Tenderness found. Medial joint line and lateral joint line tenderness noted.  Neurological: She is alert and oriented to person, place, and time.  Skin: Skin is warm and dry.  Psychiatric: She has a normal mood and affect. Her behavior is normal. Judgment and thought content normal.    Vital signs in last 24 hours: @VSRANGES @  Labs:   Estimated body mass index is 34.57 kg/(m^2) as calculated from the following:   Height as of 08/05/14: 5' (1.524 m).   Weight as of 08/05/14: 80.287 kg (177 lb).   Imaging Review Plain radiographs demonstrate severe degenerative joint disease of the bilaterally knee(s). The overall alignment ismild valgus. The bone quality appears to be adequate for age and reported activity level.  Assessment/Plan:  End stage arthritis, right knee   The patient history, physical examination, clinical judgment of the provider and imaging studies are consistent with end stage degenerative joint disease of the right knee(s) and total knee arthroplasty is deemed medically necessary. The treatment options including medical management, injection therapy arthroscopy and arthroplasty were discussed at length. The risks and benefits of total knee arthroplasty were presented and reviewed. The risks due to aseptic loosening, infection, stiffness, patella tracking problems,  thromboembolic complications and other imponderables were discussed. The patient acknowledged the explanation, agreed to proceed with the plan and consent was signed. Patient is being admitted for inpatient treatment for surgery, pain control, PT, OT, prophylactic antibiotics, VTE prophylaxis, progressive ambulation and ADL's and discharge planning. The patient is planning to be discharged home with home health services

## 2014-08-08 NOTE — Discharge Instructions (Signed)
Dr. Rod Can Total Joint Specialist [facility] [address], [place], [place] [address] [phone]  TOTAL KNEE REPLACEMENT POSTOPERATIVE DIRECTIONS    Knee Rehabilitation, Guidelines Following Surgery  Results after knee surgery are often greatly improved when you follow the exercise, range of motion and muscle strengthening exercises prescribed by your doctor. Safety measures are also important to protect the knee from further injury. Any time any of these exercises cause you to have increased pain or swelling in your knee joint, decrease the amount until you are comfortable again and slowly increase them. If you have problems or questions, call your caregiver or physical therapist for advice.   HOME CARE INSTRUCTIONS  Remove items at home which could result in a fall. This includes throw rugs or furniture in walking pathways.  Continue medications as instructed at time of discharge. You may have some home medications which will be placed on hold until you complete the course of blood thinner medication.  You may start showering once you are discharged home but do not submerge the incision under water. Just pat the incision dry and apply a dry gauze dressing on daily. Walk with walker as instructed.  You may resume a sexual relationship in one month or when given the OK by your doctor.   Use walker as long as suggested by your caregivers.  Avoid periods of inactivity such as sitting longer than an hour when not asleep. This helps prevent blood clots.  You may put full weight on your legs and walk as much as is comfortable.  You may return to work once you are cleared by your doctor.  Do not drive a car for 6 weeks or until released by you surgeon.   Do not drive while taking narcotics.  Wear the elastic stockings for three weeks following surgery during the day but you may remove then at night. Make sure you keep all of your appointments after your  operation with all of your doctors and caregivers. You should call the office at the above phone number and make an appointment for approximately two weeks after the date of your surgery. Do not remove your surgical dressing. The dressing is waterproof; you may take showers in 3 days, but do not take tub baths or submerge the dressing. Please pick up a stool softener and laxative for home use as long as you are requiring pain medications.  ICE to the affected knee every three hours for 30 minutes at a time and then as needed for pain and swelling.  Continue to use ice on the knee for pain and swelling from surgery. You may notice swelling that will progress down to the foot and ankle.  This is normal after surgery.  Elevate the leg when you are not up walking on it.   It is important for you to complete the blood thinner medication as prescribed by your doctor.  Continue to use the breathing machine which will help keep your temperature down.  It is common for your temperature to cycle up and down following surgery, especially at night when you are not up moving around and exerting yourself.  The breathing machine keeps your lungs expanded and your temperature down.  RANGE OF MOTION AND STRENGTHENING EXERCISES  Rehabilitation of the knee is important following a knee injury or an operation. After just a few days of immobilization, the muscles of the thigh which control the knee become weakened and shrink (atrophy). Knee exercises are designed to  build up the tone and strength of the thigh muscles and to improve knee motion. Often times heat used for twenty to thirty minutes before working out will loosen up your tissues and help with improving the range of motion but do not use heat for the first two weeks following surgery. These exercises can be done on a training (exercise) mat, on the floor, on a table or on a bed. Use what ever works the best and is most comfortable for you Knee exercises include:    Leg Lifts - While your knee is still immobilized in a splint or cast, you can do straight leg raises. Lift the leg to 60 degrees, hold for 3 sec, and slowly lower the leg. Repeat 10-20 times 2-3 times daily. Perform this exercise against resistance later as your knee gets better.  Quad and Hamstring Sets - Tighten up the muscle on the front of the thigh (Quad) and hold for 5-10 sec. Repeat this 10-20 times hourly. Hamstring sets are done by pushing the foot backward against an object and holding for 5-10 sec. Repeat as with quad sets.  A rehabilitation program following serious knee injuries can speed recovery and prevent re-injury in the future due to weakened muscles. Contact your doctor or a physical therapist for more information on knee rehabilitation.   SKILLED REHAB INSTRUCTIONS: If the patient is transferred to a skilled rehab facility following release from the hospital, a list of the current medications will be sent to the facility for the patient to continue.  When discharged from the skilled rehab facility, please have the facility set up the patient's [place] prior to being released. Also, the skilled facility will be responsible for providing the patient with their medications at time of release from the facility to include their pain medication, the muscle relaxants, and their blood thinner medication. If the patient is still at the rehab facility at time of the two week follow up appointment, the skilled rehab facility will also need to assist the patient in arranging follow up appointment in our office and any transportation needs.  MAKE SURE YOU:  Understand these instructions.  Will watch your condition.  Will get help right away if you are not doing well or get worse.    Pick up stool softner and laxative for home use following surgery while on pain medications. Do NOT remove your dressing. You may shower.  Do not take tub baths or submerge incision under  water. May shower starting three days after surgery. Please use a clean towel to pat the incision dry following showers. Continue to use ice for pain and swelling after surgery. Do not use any lotions or creams on the incision until instructed by your surgeon.

## 2014-08-08 NOTE — Anesthesia Procedure Notes (Signed)
Procedure Name: Intubation Date/Time: 08/08/2014 7:32 AM Performed by: Anne Fu Pre-anesthesia Checklist: Patient identified, Emergency Drugs available, Suction available and Patient being monitored Patient Re-evaluated:Patient Re-evaluated prior to inductionOxygen Delivery Method: Circle system utilized Preoxygenation: Pre-oxygenation with 100% oxygen Intubation Type: IV induction Ventilation: Mask ventilation without difficulty Laryngoscope Size: Mac Grade View: Grade I Tube type: Oral Tube size: 7.5 mm Number of attempts: 1 Placement Confirmation: ETT inserted through vocal cords under direct vision,  positive ETCO2,  CO2 detector and breath sounds checked- equal and bilateral Secured at: 21 cm Tube secured with: Tape Dental Injury: Teeth and Oropharynx as per pre-operative assessment

## 2014-08-08 NOTE — Anesthesia Preprocedure Evaluation (Signed)
Anesthesia Evaluation  Patient identified by MRN, date of birth, ID band Patient awake    Reviewed: Allergy & Precautions, H&P , NPO status , Patient's Chart, lab work & pertinent test results  Airway Mallampati: II  TM Distance: >3 FB Neck ROM: full    Dental no notable dental hx.    Pulmonary sleep apnea and Continuous Positive Airway Pressure Ventilation , former smoker,    Pulmonary exam normal       Cardiovascular Exercise Tolerance: Good negative cardio ROS      Neuro/Psych negative neurological ROS  negative psych ROS   GI/Hepatic Neg liver ROS, PUD,   Endo/Other  negative endocrine ROS  Renal/GU negative Renal ROS  negative genitourinary   Musculoskeletal   Abdominal Normal abdominal exam  (+)   Peds  Hematology negative hematology ROS (+)   Anesthesia Other Findings   Reproductive/Obstetrics negative OB ROS                             Anesthesia Physical Anesthesia Plan  ASA: II  Anesthesia Plan: General   Post-op Pain Management:    Induction: Intravenous  Airway Management Planned: Oral ETT  Additional Equipment:   Intra-op Plan:   Post-operative Plan: Extubation in OR  Informed Consent: I have reviewed the patients History and Physical, chart, labs and discussed the procedure including the risks, benefits and alternatives for the proposed anesthesia with the patient or authorized representative who has indicated his/her understanding and acceptance.   Dental Advisory Given  Plan Discussed with: CRNA and Surgeon  Anesthesia Plan Comments:         Anesthesia Quick Evaluation

## 2014-08-08 NOTE — Op Note (Signed)
OPERATIVE REPORT  SURGEON: Rod Can, MD   ASSISTANT: Roberto Scales, MD  PREOPERATIVE DIAGNOSIS: Right knee arthritis.   POSTOPERATIVE DIAGNOSIS: Right knee arthritis.   PROCEDURE: Right total knee arthroplasty.   IMPLANTS: Stryker Triathlon CR femur, size 4. Stryker universal tibia, size 3. X3 polyethelyene insert, size 11 mm, CR. 3 button asymmetric patella, size 32 mm. Simplex P bone cement.  ANESTHESIA:  General  ESTIMATED BLOOD LOSS: 50 mL.  ANTIBIOTICS: 2g ancef.  DRAINS: Medium HV x1.  COMPLICATIONS: None   CONDITION: PACU - hemodynamically stable.   BRIEF CLINICAL NOTE: Susan [name] is a 74 y.o. female with a long-standing history of Right knee arthritis. After failing conservative management, the patient was indicated for total knee arthroplasty. The risks, benefits, and alternatives to the procedure were explained, and the patient elected to proceed.  PROCEDURE IN DETAIL: Spinal anesthesia was obtained in the pre-op holding area. Once inside the operative room, a foley catheter was inserted. The patient was then positioned, a nonsterile tourniquet was placed, and the lower extremity was prepped and draped in the normal sterile surgical fashion. A time-out was called verifying side and site of surgery. The patient received IV antibiotics within 60 minutes of beginning the procedure. The extremity was exsanguinated with an Esmarch, and the tourniquet was inflated.  An anterior approach to the knee was performed utilizing a medial parapatellar arthrotomy. A medial release was performed and the patellar fat pad was excised. Stryker navigation was used to cut the distal femur perpendicular to the mechanical axis. A freehand patellar resection was performed, and the patella was sized an prepared with 3 lug holes.  Nagivation was used to make a neutral proximal  tibia resection, taking 6 mm of bone from the less affected medial side with 3 degrees of slope. The menisci were excised. A spacer block was placed, and the alignment and balance in extension were confirmed.   The distal femur was sized using the 3-degree external rotation guide referencing the posterior femoral cortex. The appropriate 4-in-1 cutting block was pinned into place. Rotation was checked using Whiteside's line, the epicondylar axis, and then confirmed with a spacer block in flexion. The remaining femoral cuts were performed, taking care to protect the MCL.  The tibia was sized and the trial tray was pinned into place. The remaining trail components were inserted. The knee was stable to varus and valgus stress through a full range of motion. The patella tracked centrally, and the PCL was well balanced. The trial components were removed, and the proximal tibial surface was prepared. Small drill holes were made in the sclerotic subchondral bone.The cut bony surfaces were irrigated with pulse lavage. Final components were cemented into place and excess cement was cleared. The trial insert was placed, and the knee was brought into extension while the cement polymerized. Once the cement was hard, the knee was tested for a final time and found to be well balanced. The trial insert was exchanged for the real polyethylene insert. An outside-in lateral retinacular release was performed to improve patellar tracking, taking care to preserve the synovium.   The wound was copiously irrigated with a dilute betadine solution followed by normal saline with pulse lavage. Marcaine solution was injected into the periarticular soft tissue. The wound was closed in layers using #1 Vicryl and V-Loc for the fascia, 2-0 Vicryl for the subcutaneous fat, 2-0 Monocryl for the deep dermal layer, 3-0 running Monocryl subcuticular Stitch, and Dermabond for the skin. Once the glue was  fully dried, an Aquacell Ag and  compressive dressing were applied. The tourniquet was let down, and the patient was transported to the recovery room in stable ondition. Sponge, needle, and instrument counts were correct at the end of the case x2. The patient tolerated the procedure well and there were no known complications.

## 2014-08-09 LAB — BASIC METABOLIC PANEL
ANION GAP: 11 (ref 5–15)
BUN: 13 mg/dL (ref 6–23)
CO2: 27 mmol/L (ref 19–32)
Calcium: 8.7 mg/dL (ref 8.4–10.5)
Chloride: 104 mmol/L (ref 96–112)
Creatinine, Ser: 0.65 mg/dL (ref 0.50–1.10)
GFR calc Af Amer: >90 mL/min (ref >90)
GFR, EST NON AFRICAN AMERICAN: 87 mL/min — AB (ref >90)
Glucose, Bld: 113 mg/dL — ABNORMAL HIGH (ref 70–99)
Potassium: 3.3 mmol/L — ABNORMAL LOW (ref 3.5–5.1)
SODIUM: 142 mmol/L (ref 135–145)

## 2014-08-09 LAB — CBC
HCT: 34.3 % — ABNORMAL LOW (ref 36.0–46.0)
HEMOGLOBIN: 11.1 g/dL — AB (ref 12.0–15.0)
MCH: 30.2 pg (ref 26.0–34.0)
MCHC: 32.4 g/dL (ref 30.0–36.0)
MCV: 93.5 fL (ref 78.0–100.0)
PLATELETS: 160 10*3/uL (ref 150–400)
RBC: 3.67 MIL/uL — ABNORMAL LOW (ref 3.87–5.11)
RDW: 13.9 % (ref 11.5–15.5)
WBC: 9.9 10*3/uL (ref 4.0–10.5)

## 2014-08-09 MED ORDER — LACTATED RINGERS IV SOLN: INTRAVENOUS | Status: DC

## 2014-08-09 NOTE — Progress Notes (Signed)
CARE MANAGEMENT NOTE 08/09/2014  Patient:  TOCARRA, GASSEN   Account Number:  [id number]  Date Initiated:  08/09/2014  Documentation initiated by:  [facility]  Subjective/Objective Assessment:   RIGHT TOTAL KNEE ARTHROPLASTY WITH NAVIGATION     Action/Plan:   Anticipated DC Date:  08/09/2014   Anticipated DC Plan:  [place]  CM consult      [facility] Choice  HOME HEALTH   Choice offered to / List presented to:  C-1 Patient        [place] arranged  HH-2 PT      [place]   Status of service:  Completed, signed off Medicare Important Message given?   (If response is "NO", the following Medicare IM given date fields will be blank) Date Medicare IM given:   Medicare IM given by:   Date Additional Medicare IM given:   Additional Medicare IM given by:    Discharge Disposition:  [place]  Per UR Regulation:    If discussed at Long Length of Stay Meetings, dates discussed:    Comments:  08/09/2014 1000 NCM spoke to pt and offered choice for [facility] Pt agreeable to [facility] for [facility]. Pt states she has RW and 3n1 at home. Notified [facility] to follow up and they do accept her insurance. Jonnie Finner RN CCM Case Mgmt phone [phone]

## 2014-08-09 NOTE — Evaluation (Signed)
Occupational Therapy Evaluation Patient Details Name: Susan [name] MRN: [id number] DOB: 02-Aug-1940 Today's Date: 08/09/2014    History of Present Illness R TKR   Clinical Impression   Pt is s/p TKA resulting in the deficits listed below (see OT Problem List).  Pt will benefit from skilled OT to increase their safety and independence with ADL and functional mobility for ADL to facilitate discharge to venue listed below.        Follow Up Recommendations  Home health OT    Equipment Recommendations  None recommended by OT    Recommendations for Other Services       Precautions / Restrictions Precautions Precautions: Fall Restrictions Weight Bearing Restrictions: No Other Position/Activity Restrictions: WBAT      Mobility Bed Mobility Overal bed mobility: Needs Assistance Bed Mobility: Supine to Sit     Supine to sit: Min assist     General bed mobility comments: pt in chair  Transfers Overall transfer level: Needs assistance Equipment used: Rolling walker (2 wheeled) Transfers: Sit to/from Stand Sit to Stand: Min assist Stand pivot transfers: Min assist       General transfer comment: cues for LE management and use of UEs to self assist    Balance                                            ADL Overall ADL's : Needs assistance/impaired     Grooming: Standing;Min guard           Upper Body Dressing : Sitting   Lower Body Dressing: Moderate assistance;Sit to/from stand       Toileting- Water quality scientist and Hygiene: Minimal assistance;Sit to/from stand       Functional mobility during ADLs: Minimal assistance;Rolling walker       Vision     Perception     Praxis      Pertinent Vitals/Pain Pain Assessment: 0-10 Pain Score: 4  Pain Location: r knee Pain Descriptors / Indicators: Sore Pain Intervention(s): Limited activity within patient's tolerance;Monitored during session;Ice applied     Hand  Dominance Right   Extremity/Trunk Assessment Upper Extremity Assessment Upper Extremity Assessment: Overall WFL for tasks assessed   Lower Extremity Assessment Lower Extremity Assessment: RLE deficits/detail RLE Deficits / Details: 3/5 quads with AAROM at knee -10-  80   Cervical / Trunk Assessment Cervical / Trunk Assessment: Normal   Communication Communication Communication: No difficulties   Cognition Arousal/Alertness: Awake/alert Behavior During Therapy: WFL for tasks assessed/performed Overall Cognitive Status: Within Functional Limits for tasks assessed                     General Comments       Exercises Exercises: Total Joint     Shoulder Instructions      Home Living Family/patient expects to be discharged to:: Private residence Living Arrangements: Alone Available Help at Discharge: Family;Friend(s) Type of Home: Apartment Home Access: Level entry;Elevator     Home Layout: One level               Home Equipment: [place] - 2 wheels;Cane - single point;Electric scooter   Additional Comments: Pt states she can have 24/7 assist initially      Prior Functioning/Environment Level of Independence: Independent with assistive device(s)             OT Diagnosis: Generalized weakness  OT Problem List: Decreased strength;Decreased activity tolerance   OT Treatment/Interventions: Self-care/ADL training;DME and/or AE instruction;Patient/family education    OT Goals(Current goals can be found in the care plan section) Acute Rehab OT Goals Patient Stated Goal: Resume previous lifestyle with decreased pain OT Goal Formulation: With patient Time For Goal Achievement: 08/23/14 Potential to Achieve Goals: Good  OT Frequency: Min 2X/week   Barriers to D/C:            Co-evaluation              End of Session Nurse Communication: Mobility status  Activity Tolerance: Patient tolerated treatment well Patient left: in chair;with call  bell/phone within reach   Time: 1210-1239 OT Time Calculation (min): 29 min Charges:  OT General Charges $OT Visit: 1 Procedure OT Evaluation $Initial OT Evaluation Tier I: 1 Procedure OT Treatments $Self Care/Home Management : 8-22 mins G-Codes:    Payton Mccallum D 28-Aug-2014, 1:04 PM

## 2014-08-09 NOTE — Progress Notes (Signed)
Physical Therapy Treatment Patient Details Name: Susan [name] MRN: [id number] DOB: June 07, 1940 Today's Date: 08/09/2014    History of Present Illness R TKR    PT Comments    Pt progressing with mobility but limited by impulsive nature and questionable safety awareness.  Follow Up Recommendations  Home health PT     Equipment Recommendations  None recommended by PT    Recommendations for Other Services OT consult     Precautions / Restrictions Precautions Precautions: Fall Restrictions Weight Bearing Restrictions: No Other Position/Activity Restrictions: WBAT    Mobility  Bed Mobility Overal bed mobility: Needs Assistance Bed Mobility: Sit to Supine       Sit to supine: Min assist   General bed mobility comments: cues for sequence and use of L LE to self assist  Transfers Overall transfer level: Needs assistance Equipment used: Rolling walker (2 wheeled) Transfers: Sit to/from Stand Sit to Stand: Min assist Stand pivot transfers: Min assist       General transfer comment: cues for LE management and use of UEs to self assist  Ambulation/Gait Ambulation/Gait assistance: Min assist Ambulation Distance (Feet): 49 Feet (and 42 and 19) Assistive device: Rolling walker (2 wheeled) Gait Pattern/deviations: Step-to pattern;Decreased step length - right;Decreased step length - left;Shuffle;Trunk flexed Gait velocity: Cues to slowwwww down   General Gait Details: cues for sequence, posture, pace and position from [employer]            Wheelchair Mobility    Modified Rankin (Stroke Patients Only)       Balance                                    Cognition Arousal/Alertness: Awake/alert Behavior During Therapy: WFL for tasks assessed/performed Overall Cognitive Status: Within Functional Limits for tasks assessed                      Exercises      General Comments        Pertinent Vitals/Pain Pain Assessment:  0-10 Pain Score: 4  Pain Location: R knee Pain Descriptors / Indicators: Aching;Sore Pain Intervention(s): Premedicated before session;Monitored during session;Limited activity within patient's tolerance;Ice applied    Home Living Family/patient expects to be discharged to:: Private residence Living Arrangements: Alone Available Help at Discharge: Family;Friend(s) Type of Home: Apartment Home Access: Level entry;Elevator   Home Layout: One level Home Equipment: [place] - 2 wheels;Cane - single point;Electric scooter Additional Comments: Pt states she can have 24/7 assist initially    Prior Function Level of Independence: Independent with assistive device(s)          PT Goals (current goals can now be found in the care plan section) Acute Rehab PT Goals Patient Stated Goal: Resume previous lifestyle with decreased pain PT Goal Formulation: With patient Time For Goal Achievement: 08/16/14 Potential to Achieve Goals: Good Progress towards PT goals: Progressing toward goals    Frequency  7X/week    PT Plan Current plan remains appropriate    Co-evaluation             End of Session Equipment Utilized During Treatment: Gait belt Activity Tolerance: Patient tolerated treatment well Patient left: in bed;with call bell/phone within reach;with family/visitor present     Time: 8115-7262 PT Time Calculation (min) (ACUTE ONLY): 24 min  Charges:  $Gait Training: 23-37 mins  G Codes:      Susan [name] 08-24-2014, 3:58 PM

## 2014-08-09 NOTE — Progress Notes (Signed)
   Subjective: 1 Day Post-Op Procedure(s) (LRB): RIGHT TOTAL KNEE ARTHROPLASTY WITH NAVIGATION (Right) Patient reports pain as mild.   We will start therapy today.  Plan is to go Home after hospital stay.  Objective: Vital signs in last 24 hours: Temp:  [96.9 F (36.1 C)-99 F (37.2 C)] 97.8 F (36.6 C) (03/19 0628) Pulse Rate:  [65-94] 76 (03/19 0628) Resp:  [12-80] 16 (03/19 0628) BP: (105-162)/(61-92) 116/61 mmHg (03/19 0628) SpO2:  [93 %-100 %] 98 % (03/19 0740)  Intake/Output from previous day:  Intake/Output Summary (Last 24 hours) at 08/09/14 0811 Last data filed at 08/09/14 0634  Gross per 24 hour  Intake 3970.83 ml  Output   2040 ml  Net 1930.83 ml    Intake/Output this shift:    Labs:  Recent Labs  08/09/14 0540  HGB 11.1*    Recent Labs  08/09/14 0540  WBC 9.9  RBC 3.67*  HCT 34.3*  PLT 160    Recent Labs  08/09/14 0540  NA 142  K 3.3*  CL 104  CO2 27  BUN 13  CREATININE 0.65  GLUCOSE 113*  CALCIUM 8.7   No results for input(s): LABPT, INR in the last 72 hours.  EXAM General - Patient is Alert, Appropriate and Oriented Extremity - Neurologically intact Neurovascular intact No cellulitis present Compartment soft Dressing - dressing C/D/I Motor Function - intact, moving foot and toes well on exam.  Hemovac pulled without difficulty.  Past Medical History  Diagnosis Date  . Diverticulosis of colon (without mention of hemorrhage)   . 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 Acute peptic ulcer, unspecified site, with hemorrhage and perforation, without mention of obstruction   . Chronic airway obstruction, not elsewhere classified   . Ventral hernia   . Hematochezia   . Arthritis     Assessment/Plan: 1 Day Post-Op Procedure(s) (LRB): RIGHT TOTAL KNEE ARTHROPLASTY WITH NAVIGATION (Right) Principal Problem:   Primary osteoarthritis of right knee   Advance diet Up with therapy D/C IV fluids Discharge home with  home health tomorrow  DVT Prophylaxis - Lovenox and Coumadin Weight-Bearing as tolerated to right leg   [place] Witz V 08/09/2014, 8:11 AM

## 2014-08-09 NOTE — Evaluation (Signed)
Physical Therapy Evaluation Patient Details Name: Susan [name] MRN: [id number] DOB: 01-Apr-1941 Today's Date: 08/09/2014   History of Present Illness  R TKR  Clinical Impression  Pt s/p R TKR presents with decreased R LE strength/ROM and post op pain limiting functional mobility.  Pt should progress to dc home with HHPT and 24/7 assist of family/friends.    Follow Up Recommendations Home health PT    Equipment Recommendations  None recommended by PT    Recommendations for Other Services OT consult     Precautions / Restrictions Precautions Precautions: Fall Restrictions Weight Bearing Restrictions: No Other Position/Activity Restrictions: WBAT      Mobility  Bed Mobility Overal bed mobility: Needs Assistance Bed Mobility: Supine to Sit     Supine to sit: Min assist     General bed mobility comments: cues for sequence and use of L LE to self assist  Transfers Overall transfer level: Needs assistance Equipment used: Rolling walker (2 wheeled) Transfers: Sit to/from [facility] Sit to Stand: Min assist Stand pivot transfers: Min assist       General transfer comment: cues for LE management and use of UEs to self assist  Ambulation/Gait Ambulation/Gait assistance: Min assist Ambulation Distance (Feet): 42 Feet Assistive device: Rolling walker (2 wheeled) Gait Pattern/deviations: Step-to pattern;Decreased step length - right;Decreased step length - left;Shuffle;Trunk flexed     General Gait Details: cues for sequence, posture and position from [employer]            Wheelchair Mobility    Modified Rankin (Stroke Patients Only)       Balance                                             Pertinent Vitals/Pain Pain Assessment: 0-10 Pain Score: 7  Pain Location: R knee Pain Descriptors / Indicators: Aching;Sore Pain Intervention(s): Limited activity within patient's tolerance;Monitored during  session;Premedicated before session;Ice applied    Home Living Family/patient expects to be discharged to:: Private residence Living Arrangements: Alone Available Help at Discharge: Family;Friend(s) Type of Home: Apartment Home Access: Level entry;Elevator     Home Layout: One level Home Equipment: Pump Back - 2 wheels;Cane - single point;Electric scooter Additional Comments: Pt states she can have 24/7 assist initially    Prior Function Level of Independence: Independent with assistive device(s)               Hand Dominance   Dominant Hand: Right    Extremity/Trunk Assessment   Upper Extremity Assessment: Overall WFL for tasks assessed           Lower Extremity Assessment: RLE deficits/detail RLE Deficits / Details: 3/5 quads with AAROM at knee -10-  80    Cervical / Trunk Assessment: Normal  Communication   Communication: No difficulties  Cognition Arousal/Alertness: Awake/alert Behavior During Therapy: WFL for tasks assessed/performed Overall Cognitive Status: Within Functional Limits for tasks assessed                      General Comments      Exercises Total Joint Exercises Ankle Circles/Pumps: AROM;Both;10 reps;Supine Quad Sets: AROM;Both;10 reps;Supine Heel Slides: AAROM;Right;15 reps;Supine Straight Leg Raises: AAROM;Right;Supine;15 reps      Assessment/Plan    PT Assessment Patient needs continued PT services  PT Diagnosis Difficulty walking   PT Problem List Decreased strength;Decreased range of  motion;Decreased activity tolerance;Decreased mobility;Decreased knowledge of use of DME;Pain  PT Treatment Interventions DME instruction;Gait training;Stair training;Therapeutic activities;Functional mobility training;Therapeutic exercise;Patient/family education   PT Goals (Current goals can be found in the Care Plan section) Acute Rehab PT Goals Patient Stated Goal: Resume previous lifestyle with decreased pain PT Goal Formulation: With  patient Time For Goal Achievement: 08/16/14 Potential to Achieve Goals: Good    Frequency 7X/week   Barriers to discharge        Co-evaluation               End of Session Equipment Utilized During Treatment: Gait belt Activity Tolerance: Patient tolerated treatment well Patient left: in chair;with call bell/phone within reach Nurse Communication: Mobility status         Time: 9417-4081 PT Time Calculation (min) (ACUTE ONLY): 38 min   Charges:   PT Evaluation $Initial PT Evaluation Tier I: 1 Procedure PT Treatments $Gait Training: 8-22 mins $Therapeutic Exercise: 8-22 mins   PT G Codes:        Ulus Hazen 2014-08-23, 12:21 PM

## 2014-08-10 LAB — CBC
HCT: 35.7 % — ABNORMAL LOW (ref 36.0–46.0)
HEMOGLOBIN: 11.7 g/dL — AB (ref 12.0–15.0)
MCH: 30.2 pg (ref 26.0–34.0)
MCHC: 32.8 g/dL (ref 30.0–36.0)
MCV: 92.2 fL (ref 78.0–100.0)
Platelets: 187 10*3/uL (ref 150–400)
RBC: 3.87 MIL/uL (ref 3.87–5.11)
RDW: 14.1 % (ref 11.5–15.5)
WBC: 9.2 10*3/uL (ref 4.0–10.5)

## 2014-08-10 NOTE — Progress Notes (Signed)
Subjective: 2 Days Post-Op Procedure(s) (LRB): RIGHT TOTAL KNEE ARTHROPLASTY WITH NAVIGATION (Right) Patient reports pain as mild to right knee.  Tolerating PO's. Progressing with PT. Denies SOB, CP, or calf pain. No N/V.  Objective: Vital signs in last 24 hours: Temp:  [97.5 F (36.4 C)-98.1 F (36.7 C)] 97.5 F (36.4 C) (03/20 0626) Pulse Rate:  [78-88] 78 (03/20 0626) Resp:  [14-16] 16 (03/20 0626) BP: (143-158)/(76-84) 148/80 mmHg (03/20 0626) SpO2:  [94 %-100 %] 99 % (03/20 0758)  Intake/Output from previous day: 03/19 0701 - 03/20 0700 In: 1836.2 [P.O.:720; I.V.:1116.2] Out: 2550 [Urine:2550] Intake/Output this shift:     Recent Labs  08/09/14 0540 08/10/14 0504  HGB 11.1* 11.7*    Recent Labs  08/09/14 0540 08/10/14 0504  WBC 9.9 9.2  RBC 3.67* 3.87  HCT 34.3* 35.7*  PLT 160 187    Recent Labs  08/09/14 0540  NA 142  K 3.3*  CL 104  CO2 27  BUN 13  CREATININE 0.65  GLUCOSE 113*  CALCIUM 8.7   No results for input(s): LABPT, INR in the last 72 hours.  Well nourished. Alert and oriented x3. RRR, Lungs clear, BS x4. Abdomen soft and non tender. Right Calf soft and non tender. Right knee dressing C/D/I. No DVT signs. Compartment soft. No signs of infection.  Right LE neurovascular intact.  Assessment/Plan: 2 Days Post-Op Procedure(s) (LRB): RIGHT TOTAL KNEE ARTHROPLASTY WITH NAVIGATION (Right) Up with PT D/c home today Follow instructions F/u in office with Dr. Carolynn Serve, Susan [name] 08/10/2014, 8:47 AM

## 2014-08-10 NOTE — Progress Notes (Signed)
Discharge instructions given. Pt verbalized understanding and all questions were answered.  

## 2014-08-10 NOTE — Progress Notes (Signed)
Occupational Therapy Treatment Patient Details Name: SYANA DEGRAFFENREID MRN: [id number] DOB: 02-05-1941 Today's Date: 08/10/2014    History of present illness R TKR   OT comments  Pt ready to Dc home from OT standpoint  Follow Up Recommendations  Home health OT    Equipment Recommendations  None recommended by OT    Recommendations for Other Services      Precautions / Restrictions Precautions Precautions: Fall Restrictions Weight Bearing Restrictions: No Other Position/Activity Restrictions: WBAT       Mobility Bed Mobility               General bed mobility comments: pt in chair  Transfers Overall transfer level: Needs assistance Equipment used: Rolling walker (2 wheeled) Transfers: Sit to/from Stand Sit to Stand: Supervision Stand pivot transfers: Supervision       General transfer comment: VC for pushing up with arms    Balance                                   ADL Overall ADL's : Needs assistance/impaired     Grooming: Standing;Supervision/safety   Upper Body Bathing: Sitting;Set up   Lower Body Bathing: Sit to/from stand;Set up   Upper Body Dressing : Set up;Sitting   Lower Body Dressing: Minimal assistance;Sit to/from stand   Toilet Transfer: RW;Set up   Toileting- Water quality scientist and Hygiene: Set up;Sit to/from stand       Functional mobility during ADLs: Set up;Rolling walker General ADL Comments: educated on using walker bag and practicing shower transfer with home PT                Cognition   Behavior During Therapy: WFL for tasks assessed/performed Overall Cognitive Status: Within Functional Limits for tasks assessed                               General Comments      Pertinent Vitals/ Pain       Pain Score: 3  Pain Location: r knee Pain Descriptors / Indicators: Sore Pain Intervention(s): Monitored during session;Ice applied  Home Living                                           Prior Functioning/Environment              Frequency Min 2X/week     Progress Toward Goals  OT Goals(current goals can now be found in the care plan section)  Progress towards OT goals: Progressing toward goals     Plan Discharge plan remains appropriate    Co-evaluation                 End of Session Equipment Utilized During Treatment: Rolling walker   Activity Tolerance Patient tolerated treatment well   Patient Left in chair;with call bell/phone within reach   Nurse Communication Mobility status        Time: 3220-2542 OT Time Calculation (min): 24 min  Charges: OT General Charges $OT Visit: 1 Procedure OT Treatments $Self Care/Home Management : 23-37 mins  Skarlett Sedlacek, Thereasa Parkin 08/10/2014, 10:23 AM

## 2014-08-10 NOTE — Progress Notes (Signed)
Physical Therapy Treatment Patient Details Name: Susan [name] MRN: [id number] DOB: 01-30-1941 Today's Date: 2014-08-26    History of Present Illness R TKR    PT Comments    Progressing well and eager for dc home this date  Follow Up Recommendations  Home health PT     Equipment Recommendations  None recommended by PT    Recommendations for Other Services OT consult     Precautions / Restrictions Precautions Precautions: Fall Restrictions Weight Bearing Restrictions: No Other Position/Activity Restrictions: WBAT    Mobility  Bed Mobility Overal bed mobility: Modified Independent             General bed mobility comments: pt in chair  Transfers Overall transfer level: Needs assistance Equipment used: Rolling walker (2 wheeled) Transfers: Sit to/from Stand Sit to Stand: Supervision Stand pivot transfers: Supervision       General transfer comment: VC for pushing up with arms  Ambulation/Gait Ambulation/Gait assistance: Min guard;Supervision Ambulation Distance (Feet): 40 Feet Assistive device: Rolling walker (2 wheeled) Gait Pattern/deviations: Step-to pattern;Decreased step length - right;Decreased step length - left;Shuffle;Trunk flexed     General Gait Details: cues for sequence, posture, pace and position from [employer]            Wheelchair Mobility    Modified Rankin (Stroke Patients Only)       Balance                                    Cognition Arousal/Alertness: Awake/alert Behavior During Therapy: WFL for tasks assessed/performed Overall Cognitive Status: Within Functional Limits for tasks assessed                      Exercises Total Joint Exercises Ankle Circles/Pumps: AROM;Both;Supine;20 reps Quad Sets: AROM;Both;Supine;20 reps Heel Slides: AAROM;Right;Supine;20 reps Straight Leg Raises: Right;Supine;AROM;20 reps Goniometric ROM: AAROM at knee -10 - 110    General Comments         Pertinent Vitals/Pain Pain Assessment: 0-10 Pain Score: 3  Pain Location: r knee Pain Descriptors / Indicators: Sore Pain Intervention(s): Monitored during session;Ice applied    Home Living                      Prior Function            PT Goals (current goals can now be found in the care plan section) Acute Rehab PT Goals Patient Stated Goal: Resume previous lifestyle with decreased pain PT Goal Formulation: With patient Time For Goal Achievement: 08/16/14 Potential to Achieve Goals: Good Progress towards PT goals: Progressing toward goals    Frequency  7X/week    PT Plan Current plan remains appropriate    Co-evaluation             End of Session Equipment Utilized During Treatment: Gait belt Activity Tolerance: Patient tolerated treatment well Patient left: in chair;with call bell/phone within reach     Time: 0800-0838 PT Time Calculation (min) (ACUTE ONLY): 38 min  Charges:  $Gait Training: 8-22 mins $Therapeutic Exercise: 23-37 mins                    G Codes:      Sylvio Weatherall 2014/08/26, 10:53 AM

## 2014-08-10 NOTE — Discharge Summary (Signed)
Physician Discharge Summary  Patient ID: Susan [name] MRN: [id number] DOB/AGE: [birth year] 74 y.o.  Admit date: 08/08/2014 Discharge date: 08/10/2014  Admission Diagnoses: Right knee primary OA  Discharge Diagnoses:  Principal Problem:   Primary osteoarthritis of right knee S/p rightTKA  Discharged Condition: Good Hospital Course:  Susan [name] is a 74 y.o. who was admitted to [facility]. They were brought to the operating room on 08/07/2014 - 08/08/2014 and underwent Procedure(s): RIGHT TOTAL [place].  Patient tolerated the procedure well and was later transferred to the recovery room and then to the orthopaedic floor for postoperative care.  They were given PO and IV analgesics for pain control following their surgery.  They were given 24 hours of postoperative antibiotics of  Anti-infectives    Start     Dose/Rate Route Frequency Ordered Stop   08/08/14 1400  acyclovir (ZOVIRAX) tablet 400 mg     400 mg Oral 2 times daily 08/08/14 1222     08/08/14 1400  ceFAZolin (ANCEF) IVPB 2 g/50 mL premix     2 g 100 mL/hr over 30 Minutes Intravenous Every 6 hours 08/08/14 1222 08/08/14 2100   08/08/14 0545  ceFAZolin (ANCEF) IVPB 2 g/50 mL premix     2 g 100 mL/hr over 30 Minutes Intravenous On call to O.R. 08/08/14 [id number] 08/08/14 0735     and started on DVT prophylaxis in the form of Lovenox.   PT and OT were ordered for total joint protocol.  Discharge planning consulted to help with postop disposition and equipment needs.  Patient had a good night on the evening of surgery and started to get up OOB with therapy on day one.  Hemovac drain was pulled without difficulty.  Continued to work with therapy into day two.  Dressing was WNL.  The patient had progressed with therapy and meeting their goals. Patient was seen in rounds and was ready to go home.  Consults: N/A  Significant Diagnostic Studies: Routine TKA  Treatments: routine TKA  Discharge  Exam: Blood pressure 148/80, pulse 78, temperature 97.5 F (36.4 C), temperature source Oral, resp. rate 16, height 5' (1.524 m), weight 80.287 kg (177 lb), SpO2 99 %. Well nourished. Alert and oriented x3. RRR, Lungs clear, BS x4. Abdomen soft and non tender. Right Calf soft and non tender. Right knee dressing C/D/I. No DVT signs. Compartment soft. No signs of infection.  Right LE neurovascular intact.  Disposition: Final discharge disposition not confirmed  Discharge Instructions    Call MD / Call 911    Complete by:  As directed   If you experience chest pain or shortness of breath, CALL 911 and be transported to the hospital emergency room.  If you develope a fever above 101 F, pus (white drainage) or increased drainage or redness at the wound, or calf pain, call your surgeon's office.     Call MD / Call 911    Complete by:  As directed   If you experience chest pain or shortness of breath, CALL 911 and be transported to the hospital emergency room.  If you develope a fever above 101 F, pus (white drainage) or increased drainage or redness at the wound, or calf pain, call your surgeon's office.     Constipation Prevention    Complete by:  As directed   Drink plenty of fluids.  Prune juice may be helpful.  You may use a stool softener, such as Colace (over the counter) 100 mg  twice a day.  Use MiraLax (over the counter) for constipation as needed.     Constipation Prevention    Complete by:  As directed   Drink plenty of fluids.  Prune juice may be helpful.  You may use a stool softener, such as Colace (over the counter) 100 mg twice a day.  Use MiraLax (over the counter) for constipation as needed.     Diet - low sodium heart healthy    Complete by:  As directed      Diet - low sodium heart healthy    Complete by:  As directed      Do not put a pillow under the knee. Place it under the heel.    Complete by:  As directed      Increase activity slowly as tolerated    Complete by:  As  directed      Increase activity slowly as tolerated    Complete by:  As directed      TED hose    Complete by:  As directed   Use stockings (TED hose) for 3 weeks on both leg(s).  You may remove them at night for sleeping.            Medication List    TAKE these medications        acetaminophen 500 MG tablet  Commonly known as:  TYLENOL  Take 1,500 mg by mouth every 6 (six) hours as needed for moderate pain.     acyclovir 400 MG tablet  Commonly known as:  ZOVIRAX  TAKE ONE TABLET TWICE A DAY     aspirin 325 MG EC tablet  Take 1 tablet (325 mg total) by mouth 2 (two) times daily after a meal.     diphenhydrAMINE 25 mg capsule  Commonly known as:  BENADRYL  Take 50 mg by mouth at bedtime as needed for allergies or sleep.     docusate sodium 100 MG capsule  Commonly known as:  COLACE  Take 1 capsule (100 mg total) by mouth 2 (two) times daily.     HYDROcodone-acetaminophen 5-325 MG per tablet  Commonly known as:  NORCO/VICODIN  Take 1-2 tablets by mouth every 4 (four) hours as needed (breakthrough pain).     meloxicam 15 MG tablet  Commonly known as:  MOBIC  Take 1 tablet (15 mg total) by mouth daily.     methocarbamol 500 MG tablet  Commonly known as:  ROBAXIN  Take 1 tablet (500 mg total) by mouth every 6 (six) hours as needed for muscle spasms.     multivitamin with minerals Tabs tablet  Take 1 tablet by mouth daily.     ondansetron 4 MG tablet  Commonly known as:  ZOFRAN  Take 1 tablet (4 mg total) by mouth every 6 (six) hours as needed for nausea.     rOPINIRole 3 MG tablet  Commonly known as:  REQUIP  TAKE ONE TABLET AT BEDTIME     senna 8.6 MG Tabs tablet  Commonly known as:  SENOKOT  Take 2 tablets (17.2 mg total) by mouth at bedtime.     tiotropium 18 MCG inhalation capsule  Commonly known as:  SPIRIVA  Place 1 capsule (18 mcg total) into inhaler and inhale daily.     traMADol 50 MG tablet  Commonly known as:  ULTRAM  Take 1-2 tablets (50-100  mg total) by mouth every 6 (six) hours as needed.     traZODone 50 MG tablet  Commonly known as:  DESYREL  TAKE ONE AND ONE-HALF TABLETS AT BEDTIMEIF NEEDED FOR SLEEP           Follow-up Information    Follow up with Swinteck, Horald Pollen, MD. Schedule an appointment as soon as possible for a visit in 2 weeks.   Specialty:  Orthopedic Surgery   Why:  For wound re-check   Contact information:   [address] [address] [phone]       Follow up with [facility]   Why:  Home Health Physical Therapy   Contact information:   [address] [place] [address] [phone]       Signed: Lajean Manes 08/10/2014, 8:49 AM

## 2014-08-11 NOTE — Progress Notes (Signed)
Utilization review completed.  

## 2014-08-18 ENCOUNTER — Other Ambulatory Visit: Payer: Self-pay | Admitting: Family Medicine

## 2014-08-18 NOTE — Telephone Encounter (Signed)
Last office visit 07/31/2014.  Last refilled 07/17/2014 for #45 with no refills.  Ok to refill?

## 2014-09-02 ENCOUNTER — Encounter: Admit: 2014-09-02 | Disposition: A | Discharge status: Home / Self Care | Payer: Self-pay

## 2014-09-05 ENCOUNTER — Other Ambulatory Visit: Payer: Self-pay | Admitting: Family Medicine

## 2014-09-05 NOTE — Telephone Encounter (Signed)
Rx faxed to [facility].

## 2014-09-05 NOTE — Telephone Encounter (Signed)
Last office visit 07/31/2014.  Last refilled 03/03/2014 for #90 with 5 refills.  Ok to refill?

## 2014-09-09 ENCOUNTER — Encounter: Payer: Self-pay | Admitting: Family Medicine

## 2014-09-09 ENCOUNTER — Ambulatory Visit (INDEPENDENT_AMBULATORY_CARE_PROVIDER_SITE_OTHER): Payer: PPO | Admitting: Family Medicine

## 2014-09-09 VITALS — BP 114/72 | HR 73 | Temp 97.7°F | Ht 60.0 in | Wt 175.5 lb

## 2014-09-09 DIAGNOSIS — N3001 Acute cystitis with hematuria: Secondary | ICD-10-CM

## 2014-09-09 DIAGNOSIS — R350 Frequency of micturition: Secondary | ICD-10-CM | POA: Diagnosis not present

## 2014-09-09 LAB — POCT URINALYSIS DIPSTICK
Bilirubin, UA: NEGATIVE
GLUCOSE UA: NEGATIVE
Ketones, UA: NEGATIVE
NITRITE UA: POSITIVE
Spec Grav, UA: >=1.03
Urobilinogen, UA: 0.2
pH, UA: 5.5

## 2014-09-09 MED ORDER — CIPROFLOXACIN HCL 250 MG PO TABS: 250.0000 mg | ORAL_TABLET | Freq: Two times a day (BID) | ORAL | Status: DC

## 2014-09-09 NOTE — Progress Notes (Signed)
Pre visit review using our clinic review tool, if applicable. No additional management support is needed unless otherwise documented below in the visit note. 

## 2014-09-09 NOTE — Assessment & Plan Note (Signed)
Given recent catherization.. Will treat with longer course for 7 days. Unable to send culture given inadequate specimen. NO sign of pyelo.

## 2014-09-09 NOTE — Patient Instructions (Signed)
Push fluids, rest.  Complete antibiotics.

## 2014-09-09 NOTE — Progress Notes (Signed)
   Subjective:    Patient ID: Susan [name], female    DOB: March 16, 1941, 74 y.o.   MRN: [id number]  Urinary Frequency  This is a new problem. The current episode started 1 to 4 weeks ago (2-3 weeks ago.). The problem occurs every urination. The problem has been rapidly worsening. Quality: no pain. There has been no fever. She is not sexually active. There is no history of pyelonephritis. Associated symptoms include frequency and urgency. Pertinent negatives include no chills, discharge, flank pain, hematuria, hesitancy, nausea, possible pregnancy or vomiting. Associated symptoms comments: Urine odor, no burning.. She has tried increased fluids for the symptoms. The treatment provided mild relief. Her past medical history is significant for catheterization. There is no history of kidney stones, recurrent UTIs, a single kidney, urinary stasis or a urological procedure. 08/08/2014      Review of Systems  Constitutional: Negative for chills.  Gastrointestinal: Negative for nausea and vomiting.  Genitourinary: Positive for urgency and frequency. Negative for hesitancy, hematuria and flank pain.       Objective:   Physical Exam  Constitutional: Vital signs are normal. She appears well-developed and well-nourished. She is cooperative.  Non-toxic appearance. She does not appear ill. No distress.  HENT:  Head: Normocephalic.  Right Ear: Hearing, tympanic membrane, external ear and ear canal normal. Tympanic membrane is not erythematous, not retracted and not bulging.  Left Ear: Hearing, tympanic membrane, external ear and ear canal normal. Tympanic membrane is not erythematous, not retracted and not bulging.  Nose: No mucosal edema or rhinorrhea. Right sinus exhibits no maxillary sinus tenderness and no frontal sinus tenderness. Left sinus exhibits no maxillary sinus tenderness and no frontal sinus tenderness.  Mouth/Throat: Uvula is midline, oropharynx is clear and moist and mucous membranes are  normal.  Eyes: Conjunctivae, EOM and lids are normal. Pupils are equal, round, and reactive to light. Lids are everted and swept, no foreign bodies found.  Neck: Trachea normal and normal range of motion. Neck supple. Carotid bruit is not present. No thyroid mass and no thyromegaly present.  Cardiovascular: Normal rate, regular rhythm, S1 normal, S2 normal, normal heart sounds, intact distal pulses and normal pulses.  Exam reveals no gallop and no friction rub.   No murmur heard. Pulmonary/Chest: Effort normal and breath sounds normal. No tachypnea. No respiratory distress. She has no decreased breath sounds. She has no wheezes. She has no rhonchi. She has no rales.  Abdominal: Soft. Normal appearance and bowel sounds are normal. There is no tenderness. There is no CVA tenderness.  Neurological: She is alert.  Skin: Skin is warm, dry and intact. No rash noted.  Psychiatric: Her speech is normal and behavior is normal. Judgment and thought content normal. Her mood appears not anxious. Cognition and memory are normal. She does not exhibit a depressed mood.          Assessment & Plan:

## 2014-09-15 ENCOUNTER — Other Ambulatory Visit: Payer: Self-pay | Admitting: Family Medicine

## 2014-09-15 NOTE — Telephone Encounter (Signed)
Last office visit 09/09/2014.  Last refilled 08/19/2013 for #30 with 5 refills.  Ok to refill?

## 2014-09-15 NOTE — Telephone Encounter (Signed)
Last office visit 09/09/2014.  Last refilled 08/19/2014 for #45 with no refills.  Ok to refill?

## 2014-09-19 ENCOUNTER — Ambulatory Visit (INDEPENDENT_AMBULATORY_CARE_PROVIDER_SITE_OTHER): Payer: PPO | Admitting: Family Medicine

## 2014-09-19 DIAGNOSIS — Z638 Other specified problems related to primary support group: Secondary | ICD-10-CM

## 2014-09-19 NOTE — Progress Notes (Signed)
No charge. Present for discussion for daughter in law current healthy issues.

## 2014-09-22 ENCOUNTER — Ambulatory Visit: Payer: Self-pay | Admitting: Orthopedic Surgery

## 2014-09-22 ENCOUNTER — Ambulatory Visit (INDEPENDENT_AMBULATORY_CARE_PROVIDER_SITE_OTHER)
Admission: RE | Admit: 2014-09-22 | Discharge: 2014-09-22 | Disposition: A | Discharge status: Home / Self Care | Payer: PPO | Source: Ambulatory Visit | Attending: Pulmonary Disease | Admitting: Pulmonary Disease

## 2014-09-22 ENCOUNTER — Ambulatory Visit: Payer: PPO | Attending: Orthopedic Surgery | Admitting: Physical Therapy

## 2014-09-22 ENCOUNTER — Encounter: Payer: Self-pay | Admitting: Physical Therapy

## 2014-09-22 DIAGNOSIS — M25561 Pain in right knee: Secondary | ICD-10-CM | POA: Insufficient documentation

## 2014-09-22 DIAGNOSIS — Z96651 Presence of right artificial knee joint: Secondary | ICD-10-CM | POA: Diagnosis not present

## 2014-09-22 DIAGNOSIS — R262 Difficulty in walking, not elsewhere classified: Secondary | ICD-10-CM | POA: Diagnosis not present

## 2014-09-22 DIAGNOSIS — Z4789 Encounter for other orthopedic aftercare: Secondary | ICD-10-CM | POA: Insufficient documentation

## 2014-09-22 DIAGNOSIS — M6281 Muscle weakness (generalized): Secondary | ICD-10-CM | POA: Diagnosis not present

## 2014-09-22 DIAGNOSIS — R911 Solitary pulmonary nodule: Secondary | ICD-10-CM | POA: Diagnosis not present

## 2014-09-22 MED ORDER — ACETAMINOPHEN 10 MG/ML IV SOLN: 1000.0000 mg | Freq: Once | INTRAVENOUS | Status: AC

## 2014-09-22 MED ORDER — DEXAMETHASONE SODIUM PHOSPHATE 4 MG/ML IJ SOLN
4.0000 mg | Freq: Once | INTRAMUSCULAR | Status: AC
  Administered 2014-10-03: 8 mg via INTRAVENOUS

## 2014-09-22 MED ORDER — SODIUM CHLORIDE 0.9 % IV SOLN: 4.0000 mg | Freq: Once | INTRAVENOUS | Status: AC

## 2014-09-22 NOTE — Therapy (Deleted)
[facility] [address] [place], [address] Phone: [phone]   Fax:  [phone]  Physical Therapy Treatment  Patient Details  Name: Susan [name] MRN: [id number] Date of Birth: June 05, 1940 Referring Provider:  Rod Can, MD  Encounter Date: 09/22/2014      PT End of Session - 09/22/14 1604    Visit Number 5   Number of Visits 19   Date for PT Re-Evaluation 10/14/14   Authorization Type 5   Authorization Time Period 10      Past Medical History  Diagnosis Date  . Diverticulosis of colon (without mention of hemorrhage)   . 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 Acute peptic ulcer, unspecified site, with hemorrhage and perforation, without mention of obstruction   . Chronic airway obstruction, not elsewhere classified   . Ventral hernia   . Hematochezia   . Arthritis     Past Surgical History  Procedure Laterality Date  . Bunionectomy Bilateral   . Total hip arthroplasty  2007    left  . Appendectomy    . Tubal ligation    . Cosmetic surgery  1970's    abdomen. Tummy Tuck  . Foot surgery Bilateral dec 2015 right     left 3 yrs ago  . Total hip arthroplasty      bilateral   . Total knee arthroplasty Right 08/08/2014    Procedure: RIGHT TOTAL KNEE ARTHROPLASTY WITH NAVIGATION;  Surgeon: Rod Can, MD;  Location: [facility];  Service: Orthopedics;  Laterality: Right;    There were no vitals filed for this visit.  Visit Diagnosis:  Pain in right knee  Difficulty in walking  Muscle weakness (generalized)      Subjective Assessment - 09/22/14 1607    Patient Stated Goals to walk better with less pain and wiithout assistive device, planning on left TKR later this month            [facility] PT Assessment - 09/22/14 0001    Assessment   Medical Diagnosis presence of right artificial knee joint   Onset Date 08/08/14   Next MD Visit 10/14/2014   Precautions   Precautions None   Restrictions   Weight Bearing Restrictions No   Balance Screen   Has the patient fallen in the past 6 months No   Has the patient had a decrease in activity level because of a fear of falling?  No             SUBJECTIVE: Patient reports she is getting out more with shopping and has discomfort in right knee with walking. She has more flexibility in her knee and feels it is able to straighten more. She is working hard on this at home using 5# weights to assist.     PAIN (with rating/descriptors) Pain is in right knee with walking and is described as soreness with intermittent spasms reported in muscles in LE's, pain level ranges up to 2/10 and does not vary much   OBJECTIVE: Gait: ambulating into clinic using walker for support. both LE's ankles are in eversion and knees in valgus with increased flexion bilaterally (due to arthritis deformity) Palpation: decreased soft tissue mobility posterior aspect of right thigh/calf muscles  AAROM: right knee 5-130 degrees flexion Interventions:Treatment:  1. STM right knee scar mobilization to improve knee flexion/extension, MFR right LE hamstring/calf muscle to imporve knee extension, patella mobilization with knee mobilizer 2 reps for medial/lateral glides and inferior /  superior glides with improved soft tissue and joint mobility and improved knee flexion to 130 degrees and extension close to 0 2.AAROM: supine:  with assistance of therapist for hip and knee flexion and extension with manual resistance:  to improve knee extension and strength in LE, hip abduction in  Hook lying with manual resistance x 10 reps with verbal and tactile cuing for correct positron and alignment of LE, standing; instructed in controlled motion for hip extension and hip abduction with patient demonstrating good technique and verbalizing understanding following instruction and demonstration, to be added to home program 3. high volt estim. (2) electrodes  applied by therapist  to posterior aspect of right LE calf and hamstring for pain control and to improve knee extension with decreased stiffness with patient supine with LE supported on pillow and Russian stim. to right quadriceps muscle/VMO 10/10 cycle to improve quadriceps control for walking   CLINICAL IMPRESSION:Patient demonstrates improving ROM and strength and ability to walk with less difficulty following treatment. Goal #1 has been achieved and other goals are partialy met and ongoing. Patient continues with decreased strength and + pain in right knee with walking and  will benefit from continued physical therapy intervention to address deficits and progress towards independent home program and prepare for left knee surgery.    PLAN: Aquatic therapy 2x this week for walking, strength and endurance exercises to improve pain free walking, follow up next week for re assessment on land                      PT Long Term Goals - 09/22/14 1610    PT LONG TERM GOAL #1   Title decrease pain in right knee to 1-2/10 with daily activities for personal care and walking    Time 3   Period Weeks   Status Achieved   PT LONG TERM GOAL #2   Title Patient will improve Lower Extremity Functional Scale (LEFS) to > or = 40 points demonstrating reduced self-reported lower extremity disability for daily tasks    Time 3   Period Weeks   Status Partially Met   PT LONG TERM GOAL #3   Title Patient will be independent with home program without verbal cuing for strengthening and flexibility exercise for self-management    Time 6   Period Weeks   Status On-going   PT LONG TERM GOAL #4   Title Improve TUG score to 14 seconds or less demonstrating decreased fall risk    Time 6   Period Weeks   Status New   PT LONG TERM GOAL #5   Title improve 10 MWT to 12 sec. to demonstrate improved community ambulation    Time 6   Period Weeks   Status New   Additional Long Term Goals   Additional  Long Term Goals Yes   PT LONG TERM GOAL #6   Title . Patient will improve Lower Extremity Functional Scale (LEFS) to > or = 49 points demonstrating reduced self-reported lower extremity disability for daily tasks    Time 6   Period Weeks   Status New               Problem List Patient Active Problem List   Diagnosis Date Noted  . Acute cystitis with hematuria 09/09/2014  . Primary osteoarthritis of right knee 08/08/2014  . Elevated blood-pressure reading without diagnosis of hypertension 07/31/2014  . Osteoarthritis of right knee, Severe 10/28/2013  . COPD exacerbation 10/22/2013  .  Glenohumeral arthritis, Severe 08/20/2013  . Fracture of superior pubic ramus 04/23/2013  . 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 Solitary pulmonary nodule 02/19/2013  . Chronic knee pain 01/15/2013  . Obstructive sleep apnea 10/11/2012  . Umbilical hernia 80/07/4915  . Urinary incontinence, urge 01/17/2012  . Diastolic dysfunction 91/50/5697  . Preoperative cardiovascular examination 04/21/2011  . DDD (degenerative disc disease) 10/26/2010  . RESTLESS LEG SYNDROME 02/23/2010  . HIP REPLACEMENT, LEFT, HX OF 12/21/2009  . FOOT PAIN, BILATERAL 03/25/2009  . HYPERCHOLESTEROLEMIA 06/18/2008  . ALLERGIC RHINITIS 05/21/2008  . ANKLE PAIN, BILATERAL 05/21/2008  . DIVERTICULOSIS, COLON 11/26/2007  . HERPES SIMPLEX INFECTION, RECURRENT 10/17/2007    Aldona Lento 09/22/2014, 4:43 PM  [place] [address], [place], [address] Phone: [phone]   Fax:  [phone]

## 2014-09-22 NOTE — Therapy (Signed)
[facility] [address], [place], [address] Phone: [phone]   Fax:  [phone]  Physical Therapy Treatment  Patient Details  Name: Susan [name] MRN: [id number] Date of Birth: Jul 02, 1940 Referring Provider:  Rod Can, MD  Encounter Date: 09/22/2014      PT End of Session - 09/22/14 1604    Visit Number 5   Number of Visits 19   Date for PT Re-Evaluation 10/14/14   Authorization Type 5   Authorization Time Period 10   PT Start Time 1540   PT Stop Time 1630   PT Time Calculation (min) 50 min      Past Medical History  Diagnosis Date  . Diverticulosis of colon (without mention of hemorrhage)   . 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 Acute peptic ulcer, unspecified site, with hemorrhage and perforation, without mention of obstruction   . Chronic airway obstruction, not elsewhere classified   . Ventral hernia   . Hematochezia   . Arthritis     Past Surgical History  Procedure Laterality Date  . Bunionectomy Bilateral   . Total hip arthroplasty  2007    left  . Appendectomy    . Tubal ligation    . Cosmetic surgery  1970's    abdomen. Tummy Tuck  . Foot surgery Bilateral dec 2015 right     left 3 yrs ago  . Total hip arthroplasty      bilateral   . Total knee arthroplasty Right 08/08/2014    Procedure: RIGHT TOTAL KNEE ARTHROPLASTY WITH NAVIGATION;  Surgeon: Rod Can, MD;  Location: [facility];  Service: Orthopedics;  Laterality: Right;    There were no vitals filed for this visit.  Visit Diagnosis:  Pain in right knee  Difficulty in walking  Muscle weakness (generalized)      Subjective Assessment - 09/22/14 1607    Patient Stated Goals to walk better with less pain and wiithout assistive device, planning on left TKR later this month            [facility] PT Assessment - 09/22/14 0001    Assessment   Medical Diagnosis presence of right artificial knee  joint   Onset Date 08/08/14   Next MD Visit 10/14/2014   Precautions   Precautions None   Restrictions   Weight Bearing Restrictions No   Balance Screen   Has the patient fallen in the past 6 months No   Has the patient had a decrease in activity level because of a fear of falling?  No                SUBJECTIVE: Patient reports she is getting out more with shopping and has discomfort in right knee with walking. She has more flexibility in her knee and feels it is able to straighten more. She is working hard on this at home using 5# weights to assist.     PAIN (with rating/descriptors) Pain is in right knee with walking and is described as soreness with intermittent spasms reported in muscles in LE's, pain level ranges up to 2/10 and does not vary much   OBJECTIVE: Gait: ambulating into clinic using walker for support. both LE's ankles are in eversion and knees in valgus with increased flexion bilaterally (due to arthritis deformity) Palpation: decreased soft tissue mobility posterior aspect of right thigh/calf muscles  AAROM: right knee 5-130 degrees flexion Interventions:Treatment:  1. STM right knee scar mobilization to  improve knee flexion/extension, MFR right LE hamstring/calf muscle to imporve knee extension, patella mobilization with knee mobilizer 2 reps for medial/lateral glides and inferior /superior glides with improved soft tissue and joint mobility and improved knee flexion to 130 degrees and extension close to 0 2.AAROM: supine:  with assistance of therapist for hip and knee flexion and extension with manual resistance:  to improve knee extension and strength in LE, hip abduction in  Hook lying with manual resistance x 10 reps with verbal and tactile cuing for correct positron and alignment of LE, standing; instructed in controlled motion for hip extension and hip abduction with patient demonstrating good technique and verbalizing understanding following instruction and  demonstration, to be added to home program 3. high volt estim. (2) electrodes applied by therapist  to posterior aspect of right LE calf and hamstring for pain control and to improve knee extension with decreased stiffness with patient supine with LE supported on pillow and Russian stim. to right quadriceps muscle/VMO 10/10 cycle to improve quadriceps control for walking   CLINICAL IMPRESSION:Patient demonstrates improving ROM and strength and ability to walk with less difficulty following treatment. Goal #1 has been achieved and other goals are partialy met and ongoing. Patient continues with decreased strength and + pain in right knee with walking and  will benefit from continued physical therapy intervention to address deficits and progress towards independent home program and prepare for left knee surgery.    PLAN: PT EVAL, NON WND ELECT STIM, THERAP EXER, MAUAL THERAPY, GAIT TRAINING THERAPY, AQUATIC THERAPY 2x this week for walking, strength and endurance exercises to improve pain free walking, follow up next week for re assessment on land Frequency/duration: 2x/week x 6 weeks                     PT Long Term Goals - 09/22/14 1610    PT LONG TERM GOAL #1   Title decrease pain in right knee to 1-2/10 with daily activities for personal care and walking    Time 3   Period Weeks   Status Achieved   PT LONG TERM GOAL #2   Title Patient will improve Lower Extremity Functional Scale (LEFS) to > or = 40 points demonstrating reduced self-reported lower extremity disability for daily tasks    Time 3   Period Weeks   Status Partially Met   PT LONG TERM GOAL #3   Title Patient will be independent with home program without verbal cuing for strengthening and flexibility exercise for self-management    Time 6   Period Weeks   Status On-going   PT LONG TERM GOAL #4   Title Improve TUG score to 14 seconds or less demonstrating decreased fall risk    Time 6   Period Weeks   Status  New   PT LONG TERM GOAL #5   Title improve 10 MWT to 12 sec. to demonstrate improved community ambulation    Time 6   Period Weeks   Status New   Additional Long Term Goals   Additional Long Term Goals Yes   PT LONG TERM GOAL #6   Title . Patient will improve Lower Extremity Functional Scale (LEFS) to > or = 49 points demonstrating reduced self-reported lower extremity disability for daily tasks    Time 6   Period Weeks   Status New               Problem List Patient Active Problem List   Diagnosis Date  Noted  . Acute cystitis with hematuria 09/09/2014  . Primary osteoarthritis of right knee 08/08/2014  . Elevated blood-pressure reading without diagnosis of hypertension 07/31/2014  . Osteoarthritis of right knee, Severe 10/28/2013  . COPD exacerbation 10/22/2013  . Glenohumeral arthritis, Severe 08/20/2013  . Fracture of superior pubic ramus 04/23/2013  . 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 Solitary pulmonary nodule 02/19/2013  . Chronic knee pain 01/15/2013  . Obstructive sleep apnea 10/11/2012  . Umbilical hernia 84/73/0856  . Urinary incontinence, urge 01/17/2012  . Diastolic dysfunction 94/37/0052  . Preoperative cardiovascular examination 04/21/2011  . DDD (degenerative disc disease) 10/26/2010  . RESTLESS LEG SYNDROME 02/23/2010  . HIP REPLACEMENT, LEFT, HX OF 12/21/2009  . FOOT PAIN, BILATERAL 03/25/2009  . HYPERCHOLESTEROLEMIA 06/18/2008  . ALLERGIC RHINITIS 05/21/2008  . ANKLE PAIN, BILATERAL 05/21/2008  . DIVERTICULOSIS, COLON 11/26/2007  . HERPES SIMPLEX INFECTION, RECURRENT 10/17/2007    Aldona Lento 09/22/2014, 8:41 PM  [facility] [address] [place], [address] Phone: [phone]   Fax:  [phone]

## 2014-09-23 ENCOUNTER — Ambulatory Visit: Payer: PPO | Admitting: Physical Therapy

## 2014-09-23 ENCOUNTER — Encounter: Payer: Self-pay | Admitting: Physical Therapy

## 2014-09-23 DIAGNOSIS — Z96651 Presence of right artificial knee joint: Secondary | ICD-10-CM | POA: Diagnosis not present

## 2014-09-23 DIAGNOSIS — R262 Difficulty in walking, not elsewhere classified: Secondary | ICD-10-CM

## 2014-09-23 DIAGNOSIS — M25561 Pain in right knee: Secondary | ICD-10-CM

## 2014-09-23 DIAGNOSIS — M6281 Muscle weakness (generalized): Secondary | ICD-10-CM

## 2014-09-23 NOTE — Therapy (Signed)
[facility] [address] [place], [address] Phone: [phone]   Fax:  [phone]  Physical Therapy Treatment  Patient Details  Name: Susan [name] MRN: [id number] Date of Birth: [birth year] Referring Provider:  [name], MD  Encounter Date: 09/23/2014      PT End of Session - 09/23/14 1323    Visit Number 6   Number of Visits 19   Date for PT Re-Evaluation 10/14/14   Authorization Type 6   Authorization Time Period 10   PT Start Time 1055   PT Stop Time 1125   PT Time Calculation (min) 30 min   Activity Tolerance Patient tolerated treatment well;No increased pain   Behavior During Therapy [facility] for tasks assessed/performed      Past Medical History  Diagnosis Date  . Diverticulosis of colon (without mention of hemorrhage)   . 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 Acute peptic ulcer, unspecified site, with hemorrhage and perforation, without mention of obstruction   . Chronic airway obstruction, not elsewhere classified   . Ventral hernia   . Hematochezia   . Arthritis     Past Surgical History  Procedure Laterality Date  . Bunionectomy Bilateral   . Total hip arthroplasty  2007    left  . Appendectomy    . Tubal ligation    . Cosmetic surgery  1970's    abdomen. Tummy Tuck  . Foot surgery Bilateral dec 2015 right     left 3 yrs ago  . Total hip arthroplasty      bilateral   . Total knee arthroplasty Right 08/08/2014    Procedure: RIGHT TOTAL KNEE ARTHROPLASTY WITH NAVIGATION;  Surgeon: Rod Can, MD;  Location: [facility];  Service: Orthopedics;  Laterality: Right;    There were no vitals filed for this visit.  Visit Diagnosis:  Pain in right knee  Difficulty in walking  Muscle weakness (generalized)      Subjective Assessment - 09/23/14 1318    Subjective Patient reports she got lost.                      Adult Aquatic Therapy - 09/23/14  1319    Treatment   Gait Patient ambulated forward x 2 laps holding to bench for support. Sidestepping x 2 laps holding to bench. 2 laps forward ambulation with hand held assist.    Exercises Standing exercises to include: marching, hip abduction, heel raises, squats, x 2 min each                         PT Long Term Goals - 09/22/14 1610    PT LONG TERM GOAL #1   Title decrease pain in right knee to 1-2/10 with daily activities for personal care and walking    Time 3   Period Weeks   Status Achieved   PT LONG TERM GOAL #2   Title Patient will improve Lower Extremity Functional Scale (LEFS) to > or = 40 points demonstrating reduced self-reported lower extremity disability for daily tasks    Time 3   Period Weeks   Status Partially Met   PT LONG TERM GOAL #3   Title Patient will be independent with home program without verbal cuing for strengthening and flexibility exercise for self-management    Time 6   Period Weeks   Status On-going   PT LONG TERM GOAL #4  Title Improve TUG score to 14 seconds or less demonstrating decreased fall risk    Time 6   Period Weeks   Status New   PT LONG TERM GOAL #5   Title improve 10 MWT to 12 sec. to demonstrate improved community ambulation    Time 6   Period Weeks   Status New   Additional Long Term Goals   Additional Long Term Goals Yes   PT LONG TERM GOAL #6   Title . Patient will improve Lower Extremity Functional Scale (LEFS) to > or = 49 points demonstrating reduced self-reported lower extremity disability for daily tasks    Time 6   Period Weeks   Status New               Plan - 09/23/14 1325    Clinical Impression Statement Patient performed all activities in aquatic environment without difficulty. Performed continues activity for 30 minutes.    Pt will benefit from skilled therapeutic intervention in order to improve on the following deficits Abnormal gait;Decreased activity tolerance;Pain;Decreased  balance;Difficulty walking   Rehab Potential Good   PT Frequency 3x / week   PT Duration 6 weeks   PT Treatment/Interventions Aquatic Therapy;Electrical Stimulation;Manual techniques;Gait training;Therapeutic exercise;Cryotherapy;Moist Heat        Problem List Patient Active Problem List   Diagnosis Date Noted  . Acute cystitis with hematuria 09/09/2014  . Primary osteoarthritis of right knee 08/08/2014  . Elevated blood-pressure reading without diagnosis of hypertension 07/31/2014  . Osteoarthritis of right knee, Severe 10/28/2013  . COPD exacerbation 10/22/2013  . Glenohumeral arthritis, Severe 08/20/2013  . Fracture of superior pubic ramus 04/23/2013  . 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 Solitary pulmonary nodule 02/19/2013  . Chronic knee pain 01/15/2013  . Obstructive sleep apnea 10/11/2012  . Umbilical hernia 30/16/0109  . Urinary incontinence, urge 01/17/2012  . Diastolic dysfunction 32/35/5732  . Preoperative cardiovascular examination 04/21/2011  . DDD (degenerative disc disease) 10/26/2010  . RESTLESS LEG SYNDROME 02/23/2010  . HIP REPLACEMENT, LEFT, HX OF 12/21/2009  . FOOT PAIN, BILATERAL 03/25/2009  . HYPERCHOLESTEROLEMIA 06/18/2008  . ALLERGIC RHINITIS 05/21/2008  . ANKLE PAIN, BILATERAL 05/21/2008  . DIVERTICULOSIS, COLON 11/26/2007  . HERPES SIMPLEX INFECTION, RECURRENT 10/17/2007    Amanda Cockayne, PT 09/23/2014, 1:33 PM  [facility] [facility] [address] [place], [address] Phone: [phone]   Fax:  [phone]

## 2014-09-24 ENCOUNTER — Encounter: Payer: PPO | Admitting: Physical Therapy

## 2014-09-25 ENCOUNTER — Ambulatory Visit: Payer: PPO | Admitting: Physical Therapy

## 2014-09-25 NOTE — Patient Instructions (Addendum)
Susan [name]  09/25/2014   Your procedure is scheduled on:   10/03/2014    Report to [facility] Main  Entrance and follow signs to               [place] at     0800 AM. You will be allowed to have only one person with you while the nurses get you ready for surgery.  Once ready you may be allowed to have others in with you.   Call this number if you have problems the morning of surgery [phone]   Remember:  Do not eat food or drink liquids :After Midnight.     Take these medicines the morning of surgery with A SIP OF WATER:  spiriva                                You may not have any metal on your body including hair pins and              piercings  Do not wear jewelry, make-up, lotions, powders or perfumes., deodorant.               Do not wear nail polish.  Do not shave  48 hours prior to surgery.               Do not bring valuables to the hospital. [place].  Contacts, dentures or bridgework may not be worn into surgery.  Leave suitcase in the car. After surgery it may be brought to your room.      Special Instructions: coughing and deep breathing exercises, leg exercises               Please read over the following fact sheets you were given: _____________________________________________________________________             [facility] - Preparing for Surgery Before surgery, you can play an important role.  Because skin is not sterile, your skin needs to be as free of germs as possible.  You can reduce the number of germs on your skin by washing with CHG (chlorahexidine gluconate) soap before surgery.  CHG is an antiseptic cleaner which kills germs and bonds with the skin to continue killing germs even after washing. Please DO NOT use if you have an allergy to CHG or antibacterial soaps.  If your skin becomes reddened/irritated stop using the CHG and inform your nurse when you arrive at  Short Stay. Do not shave (including legs and underarms) for at least 48 hours prior to the first CHG shower.  You may shave your face/neck. Please follow these instructions carefully:  1.  Shower with CHG Soap the night before surgery and the  morning of Surgery.  2.  If you choose to wash your hair, wash your hair first as usual with your  normal  shampoo.  3.  After you shampoo, rinse your hair and body thoroughly to remove the  shampoo.                           4.  Use CHG as you would any other liquid soap.  You can apply chg directly  to the skin and  wash                       Gently with a scrungie or clean washcloth.  5.  Apply the CHG Soap to your body ONLY FROM THE NECK DOWN.   Do not use on face/ open                           Wound or open sores. Avoid contact with eyes, ears mouth and genitals (private parts).                       Wash face,  Genitals (private parts) with your normal soap.             6.  Wash thoroughly, paying special attention to the area where your surgery  will be performed.  7.  Thoroughly rinse your body with warm water from the neck down.  8.  DO NOT shower/wash with your normal soap after using and rinsing off  the CHG Soap.                9.  Pat yourself dry with a clean towel.            10.  Wear clean pajamas.            11.  Place clean sheets on your bed the night of your first shower and do not  sleep with pets. Day of Surgery : Do not apply any lotions/deodorants the morning of surgery.  Please wear clean clothes to the hospital/surgery center.  FAILURE TO FOLLOW THESE INSTRUCTIONS MAY RESULT IN THE CANCELLATION OF YOUR SURGERY PATIENT SIGNATURE_________________________________  NURSE SIGNATURE__________________________________  ________________________________________________________________________  WHAT IS A BLOOD TRANSFUSION? Blood Transfusion Information  A transfusion is the replacement of blood or some of its parts. Blood is made up  of multiple cells which provide different functions.  Red blood cells carry oxygen and are used for blood loss replacement.  White blood cells fight against infection.  Platelets control bleeding.  Plasma helps clot blood.  Other blood products are available for specialized needs, such as hemophilia or other clotting disorders. BEFORE THE TRANSFUSION  Who gives blood for transfusions?   Healthy volunteers who are fully evaluated to make sure their blood is safe. This is blood bank blood. Transfusion therapy is the safest it has ever been in the practice of medicine. Before blood is taken from a donor, a complete history is taken to make sure that person has no history of diseases nor engages in risky social behavior (examples are intravenous drug use or sexual activity with multiple partners). The donor's travel history is screened to minimize risk of transmitting infections, such as malaria. The donated blood is tested for signs of infectious diseases, such as HIV and hepatitis. The blood is then tested to be sure it is compatible with you in order to minimize the chance of a transfusion reaction. If you or a relative donates blood, this is often done in anticipation of surgery and is not appropriate for emergency situations. It takes many days to process the donated blood. RISKS AND COMPLICATIONS Although transfusion therapy is very safe and saves many lives, the main dangers of transfusion include:  1. Getting an infectious disease. 2. Developing a transfusion reaction. This is an allergic reaction to something in the blood you were given. Every precaution is taken to prevent this. The decision to  have a blood transfusion has been considered carefully by your caregiver before blood is given. Blood is not given unless the benefits outweigh the risks. AFTER THE TRANSFUSION  Right after receiving a blood transfusion, you will usually feel much better and more energetic. This is especially true  if your red blood cells have gotten low (anemic). The transfusion raises the level of the red blood cells which carry oxygen, and this usually causes an energy increase.  The nurse administering the transfusion will monitor you carefully for complications. HOME CARE INSTRUCTIONS  No special instructions are needed after a transfusion. You may find your energy is better. Speak with your caregiver about any limitations on activity for underlying diseases you may have. SEEK MEDICAL CARE IF:   Your condition is not improving after your transfusion.  You develop redness or irritation at the intravenous (IV) site. SEEK IMMEDIATE MEDICAL CARE IF:  Any of the following symptoms occur over the next 12 hours:  Shaking chills.  You have a temperature by mouth above 102 F (38.9 C), not controlled by medicine.  Chest, back, or muscle pain.  People around you feel you are not acting correctly or are confused.  Shortness of breath or difficulty breathing.  Dizziness and fainting.  You get a rash or develop hives.  You have a decrease in urine output.  Your urine turns a dark color or changes to pink, red, or brown. Any of the following symptoms occur over the next 10 days:  You have a temperature by mouth above 102 F (38.9 C), not controlled by medicine.  Shortness of breath.  Weakness after normal activity.  The white part of the eye turns yellow (jaundice).  You have a decrease in the amount of urine or are urinating less often.  Your urine turns a dark color or changes to pink, red, or brown. Document Released: 05/06/2000 Document Revised: 08/01/2011 Document Reviewed: 12/24/2007 ExitCare Patient Information 2014 [place].  _______________________________________________________________________  Incentive Spirometer  An incentive spirometer is a tool that can help keep your lungs clear and active. This tool measures how well you are filling your lungs with each  breath. Taking long deep breaths may help reverse or decrease the chance of developing breathing (pulmonary) problems (especially infection) following:  A long period of time when you are unable to move or be active. BEFORE THE PROCEDURE   If the spirometer includes an indicator to show your best effort, your nurse or respiratory therapist will set it to a desired goal.  If possible, sit up straight or lean slightly forward. Try not to slouch.  Hold the incentive spirometer in an upright position. INSTRUCTIONS FOR USE  3. Sit on the edge of your bed if possible, or sit up as far as you can in bed or on a chair. 4. Hold the incentive spirometer in an upright position. 5. Breathe out normally. 6. Place the mouthpiece in your mouth and seal your lips tightly around it. 7. Breathe in slowly and as deeply as possible, raising the piston or the ball toward the top of the column. 8. Hold your breath for 3-5 seconds or for as long as possible. Allow the piston or ball to fall to the bottom of the column. 9. Remove the mouthpiece from your mouth and breathe out normally. 10. Rest for a few seconds and repeat Steps 1 through 7 at least 10 times every 1-2 hours when you are awake. Take your time and take a few normal breaths  between deep breaths. 11. The spirometer may include an indicator to show your best effort. Use the indicator as a goal to work toward during each repetition. 12. After each set of 10 deep breaths, practice coughing to be sure your lungs are clear. If you have an incision (the cut made at the time of surgery), support your incision when coughing by placing a pillow or rolled up towels firmly against it. Once you are able to get out of bed, walk around indoors and cough well. You may stop using the incentive spirometer when instructed by your caregiver.  RISKS AND COMPLICATIONS  Take your time so you do not get dizzy or light-headed.  If you are in pain, you may need to take or ask  for pain medication before doing incentive spirometry. It is harder to take a deep breath if you are having pain. AFTER USE  Rest and breathe slowly and easily.  It can be helpful to keep track of a log of your progress. Your caregiver can provide you with a simple table to help with this. If you are using the spirometer at home, follow these instructions: [place] IF:   You are having difficultly using the spirometer.  You have trouble using the spirometer as often as instructed.  Your pain medication is not giving enough relief while using the spirometer.  You develop fever of 100.5 F (38.1 C) or higher. SEEK IMMEDIATE MEDICAL CARE IF:   You cough up bloody sputum that had not been present before.  You develop fever of 102 F (38.9 C) or greater.  You develop worsening pain at or near the incision site. MAKE SURE YOU:   Understand these instructions.  Will watch your condition.  Will get help right away if you are not doing well or get worse. Document Released: 09/19/2006 Document Revised: 08/01/2011 Document Reviewed: 11/20/2006 [facility] Patient Information 2014 [place], [place].   ________________________________________________________________________

## 2014-09-26 ENCOUNTER — Encounter (HOSPITAL_COMMUNITY)
Admission: RE | Admit: 2014-09-26 | Discharge: 2014-09-26 | Disposition: A | Discharge status: Home / Self Care | Payer: PPO | Source: Ambulatory Visit | Attending: Orthopedic Surgery | Admitting: Orthopedic Surgery

## 2014-09-26 ENCOUNTER — Encounter (HOSPITAL_COMMUNITY): Payer: Self-pay

## 2014-09-26 DIAGNOSIS — Z01812 Encounter for preprocedural laboratory examination: Secondary | ICD-10-CM | POA: Insufficient documentation

## 2014-09-26 DIAGNOSIS — M179 Osteoarthritis of knee, unspecified: Secondary | ICD-10-CM | POA: Diagnosis not present

## 2014-09-26 LAB — COMPREHENSIVE METABOLIC PANEL
ALBUMIN: 4.3 g/dL (ref 3.5–5.0)
ALT: 15 U/L (ref 14–54)
AST: 17 U/L (ref 15–41)
Alkaline Phosphatase: 92 U/L (ref 38–126)
Anion gap: 10 (ref 5–15)
BILIRUBIN TOTAL: 0.8 mg/dL (ref 0.3–1.2)
BUN: 13 mg/dL (ref 6–20)
CALCIUM: 9 mg/dL (ref 8.9–10.3)
CO2: 25 mmol/L (ref 22–32)
Chloride: 107 mmol/L (ref 101–111)
Creatinine, Ser: 0.52 mg/dL (ref 0.44–1.00)
GFR calc Af Amer: >60 mL/min (ref >60)
Glucose, Bld: 99 mg/dL (ref 70–99)
Potassium: 4.3 mmol/L (ref 3.5–5.1)
Sodium: 142 mmol/L (ref 135–145)
Total Protein: 7.4 g/dL (ref 6.5–8.1)

## 2014-09-26 LAB — URINALYSIS, ROUTINE W REFLEX MICROSCOPIC
Bilirubin Urine: NEGATIVE
GLUCOSE, UA: NEGATIVE mg/dL
HGB URINE DIPSTICK: NEGATIVE
Ketones, ur: NEGATIVE mg/dL
LEUKOCYTES UA: NEGATIVE
Nitrite: NEGATIVE
Protein, ur: NEGATIVE mg/dL
SPECIFIC GRAVITY, URINE: 1.019 (ref 1.005–1.030)
UROBILINOGEN UA: 1 mg/dL (ref 0.0–1.0)
pH: 6 (ref 5.0–8.0)

## 2014-09-26 LAB — CBC
HEMATOCRIT: 42.1 % (ref 36.0–46.0)
Hemoglobin: 13.5 g/dL (ref 12.0–15.0)
MCH: 29.8 pg (ref 26.0–34.0)
MCHC: 32.1 g/dL (ref 30.0–36.0)
MCV: 92.9 fL (ref 78.0–100.0)
PLATELETS: 190 10*3/uL (ref 150–400)
RBC: 4.53 MIL/uL (ref 3.87–5.11)
RDW: 14.1 % (ref 11.5–15.5)
WBC: 6.2 10*3/uL (ref 4.0–10.5)

## 2014-09-26 LAB — PROTIME-INR
INR: 1.02 (ref 0.00–1.49)
Prothrombin Time: 13.5 seconds (ref 11.6–15.2)

## 2014-09-26 LAB — APTT: APTT: 30 s (ref 24–37)

## 2014-09-26 LAB — SURGICAL PCR SCREEN
MRSA, PCR: NEGATIVE
STAPHYLOCOCCUS AUREUS: NEGATIVE

## 2014-09-26 NOTE — Progress Notes (Signed)
EKG - 07-31-14  EPIC 01-09-13 Stress Test EPIC ECHO 12-09-12  EPIC Sleep Study 05-11-13  EPIC LOV with pulmonary 08-05-14  EPIC CT Chest 09-22-14  EPIC

## 2014-09-29 ENCOUNTER — Encounter: Payer: Self-pay | Admitting: Physical Therapy

## 2014-09-29 ENCOUNTER — Ambulatory Visit: Payer: PPO | Admitting: Physical Therapy

## 2014-09-29 DIAGNOSIS — R262 Difficulty in walking, not elsewhere classified: Secondary | ICD-10-CM

## 2014-09-29 DIAGNOSIS — Z96651 Presence of right artificial knee joint: Secondary | ICD-10-CM | POA: Diagnosis not present

## 2014-09-29 DIAGNOSIS — M6281 Muscle weakness (generalized): Secondary | ICD-10-CM

## 2014-09-29 DIAGNOSIS — M25561 Pain in right knee: Secondary | ICD-10-CM

## 2014-09-30 ENCOUNTER — Ambulatory Visit: Payer: Self-pay | Admitting: Orthopedic Surgery

## 2014-09-30 DIAGNOSIS — Z96651 Presence of right artificial knee joint: Secondary | ICD-10-CM | POA: Diagnosis not present

## 2014-09-30 NOTE — Therapy (Signed)
[facility] [address] [place], [address] Phone: [phone]   Fax:  [phone]  Sep 30, 2014   _0 @  Physical Therapy Discharge Summary  Patient: Susan [name]  MRN: [id number]  Date of Birth: 09-14-1940   Diagnosis: Pain in right knee  Difficulty in walking  Muscle weakness (generalized) Referring Provider:  [name], MD  The above patient had been seen in Physical Therapy 7 times of 18 treatments scheduled with 0  no shows and  3 cancellations.  The treatment consisted of therapeutic exercises for ROM and strengthening and electrical stimulation for neuromuscular facilitation and pain control and reduction of spasms and aquatic therapy.  The patient is: Improved since beginning physical therapy  Subjective: Patient is doing well with right knee and feels she can continue on own with home exercises as she prepares for left TKR this week.  Discharge Findings: AROM: right knee 5-125/130 degrees flexion Gait: ambulating with rolling walker with improved cadence TUG:   17.2 seconds  (initially was 20 seconds with <14 seconds indicating decrease fall risk) 10MW: 13 seconds(initially was 16 seconds = decreased for community ambulation: normal values 8-10 seconds) LEFS: 43/64  ( 64 = no self perceived disability (note: running indicators not assessed))     Goals:      PT Long Term Goals - 09/29/14 1646    PT LONG TERM GOAL #1   Title decrease pain in right knee to 1-2/10 with daily activities for personal care and walking    Time 3   Period Weeks   Status Achieved   PT LONG TERM GOAL #2   Title Patient will improve Lower Extremity Functional Scale (LEFS) to > or = 40 points demonstrating reduced self-reported lower extremity disability for daily tasks    Time 3   Period Weeks   Status Achieved   PT LONG TERM GOAL #3   Title Patient will be independent with home program without  verbal cuing for strengthening and flexibility exercise for self-management    Time 6   Period Weeks   Status Achieved   PT LONG TERM GOAL #4   Title Improve TUG score to 14 seconds or less demonstrating decreased fall risk    Time 6   Period Weeks   Status Partially Met   PT LONG TERM GOAL #5   Title improve 10 MWT to 12 sec. to demonstrate improved community ambulation    Time 6   Period Weeks   Status Not Met   PT LONG TERM GOAL #6   Title . Patient will improve Lower Extremity Functional Scale (LEFS) to > or = 49 points demonstrating reduced self-reported lower extremity disability for daily tasks    Time 6   Period Weeks   Status Partially Met          Plan - 09/29/14 1708    Clinical Impression Statement Patient has achieved or partially achieved goals for physical therapy treatment s/p right TKA and is now preparing for left TKA 10/03/2014. She demonstrates good knowledge of home program and should continue to improve with knee extension and strenght with continued independent self management with home exercise program.    Rehab Potential Good   PT Frequency 3x / week   PT Duration 6 weeks   PT Treatment/Interventions Aquatic Therapy;Manual techniques;Therapeutic exercise;Electrical Stimulation;Moist Heat   PT Next Visit Plan Patient agrees to discharge with continued home program  Sincerely,   Aldona Lento, PT   CC _0 @  [facility] [address], [place], [address] Phone: [phone]   Fax:  [phone]

## 2014-09-30 NOTE — H&P (Signed)
TOTAL KNEE ADMISSION H&P  Patient is being admitted for left total knee arthroplasty.  Subjective:  Chief Complaint:left knee pain.  HPI: Susan [name], 74 y.o. female, has a history of pain and functional disability in the left knee due to arthritis and has failed non-surgical conservative treatments for greater than 12 weeks to includeNSAID's and/or analgesics, corticosteriod injections, flexibility and strengthening excercises, supervised PT with diminished ADL's post treatment, use of assistive devices and activity modification.  Onset of symptoms was gradual, starting 1 years ago with gradually worsening course since that time. The patient noted no past surgery on the left knee(s).  Patient currently rates pain in the left knee(s) at 10 out of 10 with activity. Patient has night pain, worsening of pain with activity and weight bearing, pain that interferes with activities of daily living, pain with passive range of motion, crepitus and joint swelling.  Patient has evidence of subchondral cysts, subchondral sclerosis, periarticular osteophytes and joint space narrowing by imaging studies. There is no active infection.  Patient Active Problem List   Diagnosis Date Noted  . Acute cystitis with hematuria 09/09/2014  . Primary osteoarthritis of right knee 08/08/2014  . Elevated blood-pressure reading without diagnosis of hypertension 07/31/2014  . Osteoarthritis of right knee, Severe 10/28/2013  . COPD exacerbation 10/22/2013  . Glenohumeral arthritis, Severe 08/20/2013  . Fracture of superior pubic ramus 04/23/2013  . 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 Solitary pulmonary nodule 02/19/2013  . Chronic knee pain 01/15/2013  . Obstructive sleep apnea 10/11/2012  . Umbilical hernia 24/40/1027  . Urinary incontinence, urge 01/17/2012  . Diastolic dysfunction 25/36/6440  . Preoperative cardiovascular examination 04/21/2011  . DDD (degenerative disc disease) 10/26/2010  . RESTLESS LEG SYNDROME  02/23/2010  . HIP REPLACEMENT, LEFT, HX OF 12/21/2009  . FOOT PAIN, BILATERAL 03/25/2009  . HYPERCHOLESTEROLEMIA 06/18/2008  . ALLERGIC RHINITIS 05/21/2008  . ANKLE PAIN, BILATERAL 05/21/2008  . DIVERTICULOSIS, COLON 11/26/2007  . HERPES SIMPLEX INFECTION, RECURRENT 10/17/2007   Past Medical History  Diagnosis Date  . Diverticulosis of colon (without mention of hemorrhage)   . 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 Acute peptic ulcer, unspecified site, with hemorrhage and perforation, without mention of obstruction   . Chronic airway obstruction, not elsewhere classified   . Ventral hernia   . Hematochezia   . Arthritis     Past Surgical History  Procedure Laterality Date  . Bunionectomy Bilateral   . Total hip arthroplasty  2007    left  . Appendectomy    . Tubal ligation    . Cosmetic surgery  1970's    abdomen. Tummy Tuck  . Foot surgery Bilateral dec 2015 right     left 3 yrs ago  . Total hip arthroplasty      bilateral   . Total knee arthroplasty Right 08/08/2014    Procedure: RIGHT TOTAL KNEE ARTHROPLASTY WITH NAVIGATION;  Surgeon: Rod Can, MD;  Location: [facility];  Service: Orthopedics;  Laterality: Right;     (Not in a hospital admission) No Known Allergies  History  Substance Use Topics  . Smoking status: Former Smoker -- 0.50 packs/day for 40 years    Types: Cigarettes    Quit date: 08/29/2000  . Smokeless tobacco: Never Used  . Alcohol Use: Yes     Comment: rare    Family History  Problem Relation Age of Onset  . Diabetes Mother   . Coronary artery disease Mother   . Lung disease Mother   .  Heart disease Mother     CAD  . Coronary artery disease Father   . Kidney disease Father   . Diabetes Father   . Heart disease Father     CAD  . Cancer Neg Hx      Review of Systems  Constitutional: Negative.   HENT: Negative.   Eyes: Negative.   Respiratory: Negative.   Cardiovascular: Negative.   Gastrointestinal: Negative.    Genitourinary: Negative.   Musculoskeletal: Positive for joint pain.  Skin: Negative.   Endo/Heme/Allergies: Negative.   Psychiatric/Behavioral: Negative.     Objective:  Physical Exam  Constitutional: She is oriented to person, place, and time. She appears well-developed and well-nourished.  HENT:  Head: Normocephalic and atraumatic.  Neck: Normal range of motion. Neck supple.  Cardiovascular: Normal rate, regular rhythm, normal heart sounds and intact distal pulses.   Respiratory: Effort normal and breath sounds normal. No respiratory distress.  GI: Soft. Bowel sounds are normal. She exhibits no distension.  Genitourinary:  deferred  Neurological: She is alert and oriented to person, place, and time. She has normal reflexes.  Skin: Skin is warm and dry.  Psychiatric: She has a normal mood and affect. Her behavior is normal. Judgment and thought content normal.    Vital signs in last 24 hours: @VSRANGES @  Labs:   Estimated body mass index is 38.28 kg/(m^2) as calculated from the following:   Height as of 09/26/14: 5' (1.524 m).   Weight as of 09/26/14: 88.905 kg (196 lb).   Imaging Review Plain radiographs demonstrate severe degenerative joint disease of the left knee(s). The overall alignment ismild valgus. The bone quality appears to be adequate for age and reported activity level.  Assessment/Plan:  End stage arthritis, left knee   The patient history, physical examination, clinical judgment of the provider and imaging studies are consistent with end stage degenerative joint disease of the left knee(s) and total knee arthroplasty is deemed medically necessary. The treatment options including medical management, injection therapy arthroscopy and arthroplasty were discussed at length. The risks and benefits of total knee arthroplasty were presented and reviewed. The risks due to aseptic loosening, infection, stiffness, patella tracking problems, thromboembolic complications  and other imponderables were discussed. The patient acknowledged the explanation, agreed to proceed with the plan and consent was signed. Patient is being admitted for inpatient treatment for surgery, pain control, PT, OT, prophylactic antibiotics, VTE prophylaxis, progressive ambulation and ADL's and discharge planning. The patient is planning to be discharged home with home health services

## 2014-09-30 NOTE — Therapy (Signed)
[facility] [address], [place], [address] Phone: [phone]   Fax:  [phone]  Physical Therapy Treatment  Patient Details  Name: Susan [name] MRN: [id number] Date of Birth: [birth year] Referring Provider:  [name], MD  Encounter Date: 09/29/2014      PT End of Session - 09/29/14 1706    Visit Number 7   Number of Visits 19   Date for PT Re-Evaluation 10/14/14   Authorization Type 7   Authorization Time Period 10   PT Start Time 1615   PT Stop Time 8366   PT Time Calculation (min) 30 min   Activity Tolerance Patient tolerated treatment well;No increased pain   Behavior During Therapy [facility] for tasks assessed/performed      Past Medical History  Diagnosis Date  . Diverticulosis of colon (without mention of hemorrhage)   . 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 Acute peptic ulcer, unspecified site, with hemorrhage and perforation, without mention of obstruction   . Chronic airway obstruction, not elsewhere classified   . Ventral hernia   . Hematochezia   . Arthritis     Past Surgical History  Procedure Laterality Date  . Bunionectomy Bilateral   . Total hip arthroplasty  2007    left  . Appendectomy    . Tubal ligation    . Cosmetic surgery  1970's    abdomen. Tummy Tuck  . Foot surgery Bilateral dec 2015 right     left 3 yrs ago  . Total hip arthroplasty      bilateral   . Total knee arthroplasty Right 08/08/2014    Procedure: RIGHT TOTAL KNEE ARTHROPLASTY WITH NAVIGATION;  Surgeon: Rod Can, MD;  Location: [facility];  Service: Orthopedics;  Laterality: Right;    There were no vitals filed for this visit.  Visit Diagnosis:  Pain in right knee  Difficulty in walking  Muscle weakness (generalized)      Subjective Assessment - 09/29/14 1655    Subjective Patient reports she is getting ready for left knee surgery scheduled for this Friday, 10/03/2014. She  is having mild to no pain in right knee and a lot of pain in her left knee with standing and walking.    Limitations Walking;Standing   Patient Stated Goals to walk better with less pain and wiithout assistive device, planning on left TKR this week   Currently in Pain? Yes   Pain Score 5    Pain Location Knee   Pain Orientation Left   Pain Descriptors / Indicators Aching   Pain Type Chronic pain   Aggravating Factors  walking, standing        SUBJECTIVE: Patient is concerned with right knee extension and reports her right knee is less swollen and only sore, not painful as she gets ready for left knee surgery at the end of this week. She continues to use walker for ambulation and she reports she will continue to use walker in the future.  PAIN (with rating/descriptors) right knee mild soreness only, left knee is what is more painful today  OBJECTIVE: Observation: mild swelling noted right knee with mild increased warmth inferior aspect of right knee just below patella, posture in standing: both LE's with eversion at ankles, increased valgus at knees AROM: right knee flexion 5-125/130 degrees  Treatment: Re assessed home exercises for ROM and strength with re assessments of strength: right LE hip flexion 4/5, knee extension 4/5, flexion  4/5 with 5 reps each with patient seated on treatment table, performed right knee extension with assistance with hold relax with LE pressing into treatment table with assistance of therapist with manual and verbal cues to improve knee extension: improved from lacking 10 degrees to lacking 5 degrees following manual therapy/exercise intervention TUG: 17.2 seconds  (goal is 14 seconds or less for decreased fall risk) (initial evaluation 20 seconds), 10 MW: 13 seconds (goal is 10-12 seconds for community ambulation) (initial evaluation was 16 seconds) Both assessed with patient using walker for support Manual therapy for patella mobilization using patella  mobilizer 3 reps each direction with distraction and soft tissue mobilization right quadriceps superior to patella and along scar tissue at superior aspect of incision with improved soft tissue mobility and improved quadriceps control for quad setting noted and improved knee extension        PT Education - 09/29/14 1657    Education provided Yes   Education Details re assessed home exercises to improve right knee extension and for strengthening right LE as she prepares for left knee surgery   Person(s) Educated Patient   Methods Explanation   Comprehension Verbalized understanding             PT Long Term Goals - 09/29/14 1646    PT LONG TERM GOAL #1   Title decrease pain in right knee to 1-2/10 with daily activities for personal care and walking    Time 3   Period Weeks   Status Achieved   PT LONG TERM GOAL #2   Title Patient will improve Lower Extremity Functional Scale (LEFS) to > or = 40 points demonstrating reduced self-reported lower extremity disability for daily tasks    Time 3   Period Weeks   Status Achieved   PT LONG TERM GOAL #3   Title Patient will be independent with home program without verbal cuing for strengthening and flexibility exercise for self-management    Time 6   Period Weeks   Status Achieved   PT LONG TERM GOAL #4   Title Improve TUG score to 14 seconds or less demonstrating decreased fall risk    Time 6   Period Weeks   Status Partially Met   PT LONG TERM GOAL #5   Title improve 10 MWT to 12 sec. to demonstrate improved community ambulation    Time 6   Period Weeks   Status Not Met   PT LONG TERM GOAL #6   Title . Patient will improve Lower Extremity Functional Scale (LEFS) to > or = 49 points demonstrating reduced self-reported lower extremity disability for daily tasks    Time 6   Period Weeks   Status Partially Met               Plan - 09/29/14 1708    Clinical Impression Statement Patient has achieved or partially achieved  goals for physical therapy treatment s/p right TKA and is now preparing for left TKA 10/03/2014. She demonstrates good knowledge of home program and should continue to improve with knee extension and strenght with continued independent self management with home exercise program.    Rehab Potential Good   PT Frequency 3x / week   PT Duration 6 weeks   PT Treatment/Interventions Aquatic Therapy;Manual techniques;Therapeutic exercise;Electrical Stimulation;Moist Heat   PT Next Visit Plan Patient agrees to discharge with continued home program          G-Codes - 2014-10-26 0859    Functional Assessment Tool Used  TUG, 10MW, LEFS, clinical impression   Functional Limitation Mobility: Walking and moving around   Mobility: Walking and Moving Around Current Status [phone]) At least 40 percent but less than 60 percent impaired, limited or restricted   Mobility: Walking and Moving Around Goal Status [phone]) At least 20 percent but less than 40 percent impaired, limited or restricted   Mobility: Walking and Moving Around Discharge Status [phone]) At least 20 percent but less than 40 percent impaired, limited or restricted      Problem List Patient Active Problem List   Diagnosis Date Noted  . Acute cystitis with hematuria 09/09/2014  . Primary osteoarthritis of right knee 08/08/2014  . Elevated blood-pressure reading without diagnosis of hypertension 07/31/2014  . Osteoarthritis of right knee, Severe 10/28/2013  . COPD exacerbation 10/22/2013  . Glenohumeral arthritis, Severe 08/20/2013  . Fracture of superior pubic ramus 04/23/2013  . 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 Solitary pulmonary nodule 02/19/2013  . Chronic knee pain 01/15/2013  . Obstructive sleep apnea 10/11/2012  . Umbilical hernia 82/50/5397  . Urinary incontinence, urge 01/17/2012  . Diastolic dysfunction 67/34/1937  . Preoperative cardiovascular examination 04/21/2011  . DDD (degenerative disc disease) 10/26/2010  . RESTLESS LEG SYNDROME  02/23/2010  . HIP REPLACEMENT, LEFT, HX OF 12/21/2009  . FOOT PAIN, BILATERAL 03/25/2009  . HYPERCHOLESTEROLEMIA 06/18/2008  . ALLERGIC RHINITIS 05/21/2008  . ANKLE PAIN, BILATERAL 05/21/2008  . DIVERTICULOSIS, COLON 11/26/2007  . HERPES SIMPLEX INFECTION, RECURRENT 10/17/2007    Jomarie Longs, PT   09/30/2014, 9:24 AM  [facility] [address], [place], [address] Phone: [phone]   Fax:  [phone]

## 2014-10-03 ENCOUNTER — Encounter (HOSPITAL_COMMUNITY)
Admission: RE | Disposition: A | Discharge status: Home Health | Payer: Self-pay | Source: Ambulatory Visit | Attending: Orthopedic Surgery

## 2014-10-03 ENCOUNTER — Inpatient Hospital Stay (HOSPITAL_COMMUNITY): Payer: PPO | Admitting: Anesthesiology

## 2014-10-03 ENCOUNTER — Inpatient Hospital Stay (HOSPITAL_COMMUNITY)
Admission: RE | Admit: 2014-10-03 | Discharge: 2014-10-05 | DRG: 470 | Disposition: A | Discharge status: Home Health | Payer: PPO | Source: Ambulatory Visit | Attending: Orthopedic Surgery | Admitting: Orthopedic Surgery

## 2014-10-03 ENCOUNTER — Inpatient Hospital Stay (HOSPITAL_COMMUNITY): Payer: PPO

## 2014-10-03 ENCOUNTER — Encounter (HOSPITAL_COMMUNITY): Payer: Self-pay | Admitting: *Deleted

## 2014-10-03 DIAGNOSIS — M1712 Unilateral primary osteoarthritis, left knee: Principal | ICD-10-CM | POA: Diagnosis present

## 2014-10-03 DIAGNOSIS — Z96651 Presence of right artificial knee joint: Secondary | ICD-10-CM | POA: Diagnosis present

## 2014-10-03 DIAGNOSIS — Z8249 Family history of ischemic heart disease and other diseases of the circulatory system: Secondary | ICD-10-CM

## 2014-10-03 DIAGNOSIS — Z87891 Personal history of nicotine dependence: Secondary | ICD-10-CM | POA: Diagnosis not present

## 2014-10-03 DIAGNOSIS — J449 Chronic obstructive pulmonary disease, unspecified: Secondary | ICD-10-CM | POA: Diagnosis present

## 2014-10-03 DIAGNOSIS — Z09 Encounter for follow-up examination after completed treatment for conditions other than malignant neoplasm: Secondary | ICD-10-CM

## 2014-10-03 DIAGNOSIS — Z96643 Presence of artificial hip joint, bilateral: Secondary | ICD-10-CM | POA: Diagnosis present

## 2014-10-03 DIAGNOSIS — G4733 Obstructive sleep apnea (adult) (pediatric): Secondary | ICD-10-CM | POA: Diagnosis present

## 2014-10-03 DIAGNOSIS — Z8711 Personal history of peptic ulcer disease: Secondary | ICD-10-CM

## 2014-10-03 DIAGNOSIS — Z833 Family history of diabetes mellitus: Secondary | ICD-10-CM

## 2014-10-03 DIAGNOSIS — Z01812 Encounter for preprocedural laboratory examination: Secondary | ICD-10-CM

## 2014-10-03 DIAGNOSIS — M25562 Pain in left knee: Secondary | ICD-10-CM | POA: Diagnosis present

## 2014-10-03 HISTORY — PX: TOTAL KNEE ARTHROPLASTY: SHX125

## 2014-10-03 LAB — TYPE AND SCREEN
ABO/RH(D): O POS
Antibody Screen: NEGATIVE

## 2014-10-03 SURGERY — ARTHROPLASTY, KNEE, TOTAL
Anesthesia: General | Site: Knee | Laterality: Left

## 2014-10-03 MED ORDER — ASPIRIN EC 325 MG PO TBEC
325.0000 mg | DELAYED_RELEASE_TABLET | Freq: Two times a day (BID) | ORAL | Status: DC
  Administered 2014-10-03 – 2014-10-05 (×4): 325 mg via ORAL
  Filled 2014-10-03 (×6): qty 1

## 2014-10-03 MED ORDER — MEPERIDINE HCL 50 MG/ML IJ SOLN: 6.2500 mg | INTRAMUSCULAR | Status: DC | PRN

## 2014-10-03 MED ORDER — NEOSTIGMINE METHYLSULFATE 10 MG/10ML IV SOLN
INTRAVENOUS | Status: DC | PRN
  Administered 2014-10-03: 3 mg via INTRAVENOUS

## 2014-10-03 MED ORDER — SODIUM CHLORIDE 0.9 % IJ SOLN
INTRAMUSCULAR | Status: AC
  Filled 2014-10-03: qty 50

## 2014-10-03 MED ORDER — PHENYLEPHRINE HCL 10 MG/ML IJ SOLN
INTRAMUSCULAR | Status: DC | PRN
  Administered 2014-10-03: 40 ug via INTRAVENOUS
  Administered 2014-10-03: 80 ug via INTRAVENOUS
  Administered 2014-10-03: 40 ug via INTRAVENOUS
  Administered 2014-10-03 (×3): 80 ug via INTRAVENOUS

## 2014-10-03 MED ORDER — SODIUM CHLORIDE 0.9 % IV SOLN
1000.0000 mg | INTRAVENOUS | Status: AC
  Administered 2014-10-03: 1000 mg via INTRAVENOUS
  Filled 2014-10-03: qty 10

## 2014-10-03 MED ORDER — LABETALOL HCL 5 MG/ML IV SOLN
INTRAVENOUS | Status: DC | PRN
  Administered 2014-10-03 (×2): 5 mg via INTRAVENOUS

## 2014-10-03 MED ORDER — ISOPROPYL ALCOHOL 70 % SOLN
Status: DC | PRN
  Administered 2014-10-03: 1 via TOPICAL

## 2014-10-03 MED ORDER — SODIUM CHLORIDE 0.9 % IV SOLN: INTRAVENOUS | Status: DC

## 2014-10-03 MED ORDER — SODIUM CHLORIDE 0.9 % IJ SOLN
INTRAMUSCULAR | Status: DC | PRN
  Administered 2014-10-03: 29 mL

## 2014-10-03 MED ORDER — SENNA 8.6 MG PO TABS
2.0000 | ORAL_TABLET | Freq: Every day | ORAL | Status: DC
Start: 2014-10-03 — End: 2014-11-06

## 2014-10-03 MED ORDER — PROPOFOL 10 MG/ML IV BOLUS
INTRAVENOUS | Status: AC
  Filled 2014-10-03: qty 20

## 2014-10-03 MED ORDER — ACETAMINOPHEN 325 MG PO TABS: 650.0000 mg | ORAL_TABLET | Freq: Four times a day (QID) | ORAL | Status: DC | PRN

## 2014-10-03 MED ORDER — ROPINIROLE HCL 1 MG PO TABS
3.0000 mg | ORAL_TABLET | Freq: Every day | ORAL | Status: DC
  Administered 2014-10-03 – 2014-10-04 (×2): 3 mg via ORAL
  Filled 2014-10-03 (×3): qty 3

## 2014-10-03 MED ORDER — ONDANSETRON HCL 4 MG/2ML IJ SOLN
INTRAMUSCULAR | Status: AC
  Filled 2014-10-03: qty 2

## 2014-10-03 MED ORDER — POVIDONE-IODINE 10 % EX SOLN
CUTANEOUS | Status: DC | PRN
  Administered 2014-10-03: 1 via TOPICAL

## 2014-10-03 MED ORDER — HYDROMORPHONE HCL 2 MG/ML IJ SOLN
INTRAMUSCULAR | Status: AC
  Filled 2014-10-03: qty 1

## 2014-10-03 MED ORDER — KETOROLAC TROMETHAMINE 15 MG/ML IJ SOLN
15.0000 mg | Freq: Four times a day (QID) | INTRAMUSCULAR | Status: AC
  Administered 2014-10-03 – 2014-10-04 (×4): 15 mg via INTRAVENOUS
  Filled 2014-10-03 (×5): qty 1

## 2014-10-03 MED ORDER — BUPIVACAINE-EPINEPHRINE (PF) 0.25% -1:200000 IJ SOLN
INTRAMUSCULAR | Status: DC | PRN
  Administered 2014-10-03: 30 mL via PERINEURAL

## 2014-10-03 MED ORDER — HYDROMORPHONE HCL 1 MG/ML IJ SOLN: 0.5000 mg | INTRAMUSCULAR | Status: DC | PRN

## 2014-10-03 MED ORDER — LIDOCAINE HCL (CARDIAC) 20 MG/ML IV SOLN
INTRAVENOUS | Status: AC
  Filled 2014-10-03: qty 5

## 2014-10-03 MED ORDER — ACETAMINOPHEN 10 MG/ML IV SOLN
1000.0000 mg | Freq: Once | INTRAVENOUS | Status: AC
  Administered 2014-10-03: 1000 mg via INTRAVENOUS
  Filled 2014-10-03 (×2): qty 100

## 2014-10-03 MED ORDER — LIDOCAINE HCL (CARDIAC) 20 MG/ML IV SOLN
INTRAVENOUS | Status: DC | PRN
  Administered 2014-10-03: 100 mg via INTRAVENOUS

## 2014-10-03 MED ORDER — FENTANYL CITRATE (PF) 100 MCG/2ML IJ SOLN
INTRAMUSCULAR | Status: AC
  Filled 2014-10-03: qty 2

## 2014-10-03 MED ORDER — GLYCOPYRROLATE 0.2 MG/ML IJ SOLN
INTRAMUSCULAR | Status: AC
  Filled 2014-10-03: qty 2

## 2014-10-03 MED ORDER — METOCLOPRAMIDE HCL 5 MG/ML IJ SOLN: 5.0000 mg | Freq: Three times a day (TID) | INTRAMUSCULAR | Status: DC | PRN

## 2014-10-03 MED ORDER — MIDAZOLAM HCL 2 MG/2ML IJ SOLN
INTRAMUSCULAR | Status: AC
  Filled 2014-10-03: qty 2

## 2014-10-03 MED ORDER — KETOROLAC TROMETHAMINE 30 MG/ML IJ SOLN
INTRAMUSCULAR | Status: DC | PRN
  Administered 2014-10-03: 30 mg

## 2014-10-03 MED ORDER — DIPHENHYDRAMINE HCL 12.5 MG/5ML PO ELIX: 12.5000 mg | ORAL_SOLUTION | ORAL | Status: DC | PRN

## 2014-10-03 MED ORDER — PHENYLEPHRINE 40 MCG/ML (10ML) SYRINGE FOR IV PUSH (FOR BLOOD PRESSURE SUPPORT)
PREFILLED_SYRINGE | INTRAVENOUS | Status: AC
  Filled 2014-10-03: qty 10

## 2014-10-03 MED ORDER — HYDROCODONE-ACETAMINOPHEN 5-325 MG PO TABS
1.0000 | ORAL_TABLET | ORAL | Status: DC | PRN
  Administered 2014-10-04 (×4): 2 via ORAL
  Administered 2014-10-04: 1 via ORAL
  Administered 2014-10-05 (×3): 2 via ORAL
  Filled 2014-10-03 (×7): qty 2
  Filled 2014-10-03: qty 1

## 2014-10-03 MED ORDER — 0.9 % SODIUM CHLORIDE (POUR BTL) OPTIME
TOPICAL | Status: DC | PRN
  Administered 2014-10-03: 1000 mL

## 2014-10-03 MED ORDER — LABETALOL HCL 5 MG/ML IV SOLN
INTRAVENOUS | Status: AC
  Filled 2014-10-03: qty 4

## 2014-10-03 MED ORDER — ONDANSETRON HCL 4 MG/2ML IJ SOLN
INTRAMUSCULAR | Status: DC | PRN
  Administered 2014-10-03: 4 mg via INTRAVENOUS

## 2014-10-03 MED ORDER — CEFAZOLIN SODIUM-DEXTROSE 2-3 GM-% IV SOLR
INTRAVENOUS | Status: AC
  Filled 2014-10-03: qty 50

## 2014-10-03 MED ORDER — GLYCOPYRROLATE 0.2 MG/ML IJ SOLN
INTRAMUSCULAR | Status: DC | PRN
  Administered 2014-10-03: 0.4 mg via INTRAVENOUS

## 2014-10-03 MED ORDER — ROCURONIUM BROMIDE 100 MG/10ML IV SOLN
INTRAVENOUS | Status: AC
  Filled 2014-10-03: qty 1

## 2014-10-03 MED ORDER — KETOROLAC TROMETHAMINE 30 MG/ML IJ SOLN
INTRAMUSCULAR | Status: AC
  Filled 2014-10-03: qty 1

## 2014-10-03 MED ORDER — TIOTROPIUM BROMIDE MONOHYDRATE 18 MCG IN CAPS
18.0000 ug | ORAL_CAPSULE | Freq: Every day | RESPIRATORY_TRACT | Status: DC
  Administered 2014-10-04 – 2014-10-05 (×2): 18 ug via RESPIRATORY_TRACT
  Filled 2014-10-03: qty 5

## 2014-10-03 MED ORDER — METHOCARBAMOL 1000 MG/10ML IJ SOLN
500.0000 mg | Freq: Four times a day (QID) | INTRAMUSCULAR | Status: DC | PRN
  Filled 2014-10-03: qty 5

## 2014-10-03 MED ORDER — PROPOFOL 10 MG/ML IV BOLUS
INTRAVENOUS | Status: DC | PRN
  Administered 2014-10-03: 150 mg via INTRAVENOUS

## 2014-10-03 MED ORDER — SODIUM CHLORIDE 0.9 % IV SOLN
INTRAVENOUS | Status: DC
  Administered 2014-10-03 – 2014-10-04 (×2): via INTRAVENOUS

## 2014-10-03 MED ORDER — ONDANSETRON 8 MG PO TBDP: 8.0000 mg | ORAL_TABLET | Freq: Three times a day (TID) | ORAL | Status: DC | PRN

## 2014-10-03 MED ORDER — CEFAZOLIN SODIUM-DEXTROSE 2-3 GM-% IV SOLR
2.0000 g | Freq: Four times a day (QID) | INTRAVENOUS | Status: AC
  Administered 2014-10-03 (×2): 2 g via INTRAVENOUS
  Filled 2014-10-03 (×2): qty 50

## 2014-10-03 MED ORDER — DEXAMETHASONE SODIUM PHOSPHATE 10 MG/ML IJ SOLN
INTRAMUSCULAR | Status: DC | PRN
  Administered 2014-10-03: 10 mg via INTRAVENOUS

## 2014-10-03 MED ORDER — ONDANSETRON HCL 4 MG/2ML IJ SOLN: 4.0000 mg | Freq: Four times a day (QID) | INTRAMUSCULAR | Status: DC | PRN

## 2014-10-03 MED ORDER — MENTHOL 3 MG MT LOZG: 1.0000 | LOZENGE | OROMUCOSAL | Status: DC | PRN

## 2014-10-03 MED ORDER — SENNA 8.6 MG PO TABS
2.0000 | ORAL_TABLET | Freq: Every day | ORAL | Status: DC
  Administered 2014-10-03 – 2014-10-04 (×2): 17.2 mg via ORAL

## 2014-10-03 MED ORDER — SODIUM CHLORIDE 0.9 % IR SOLN
Status: DC | PRN
  Administered 2014-10-03: 4000 mL

## 2014-10-03 MED ORDER — ALUM & MAG HYDROXIDE-SIMETH 200-200-20 MG/5ML PO SUSP: 30.0000 mL | ORAL | Status: DC | PRN

## 2014-10-03 MED ORDER — HYDROGEN PEROXIDE 3 % EX SOLN
CUTANEOUS | Status: AC
  Filled 2014-10-03: qty 473

## 2014-10-03 MED ORDER — ACETAMINOPHEN 650 MG RE SUPP: 650.0000 mg | Freq: Four times a day (QID) | RECTAL | Status: DC | PRN

## 2014-10-03 MED ORDER — LACTATED RINGERS IV SOLN
INTRAVENOUS | Status: DC
  Administered 2014-10-03: 1000 mL via INTRAVENOUS
  Administered 2014-10-03 (×2): via INTRAVENOUS

## 2014-10-03 MED ORDER — METHOCARBAMOL 500 MG PO TABS
500.0000 mg | ORAL_TABLET | Freq: Four times a day (QID) | ORAL | Status: DC | PRN
  Administered 2014-10-04 (×2): 500 mg via ORAL
  Filled 2014-10-03 (×2): qty 1

## 2014-10-03 MED ORDER — HYDROCODONE-ACETAMINOPHEN 5-325 MG PO TABS: 1.0000 | ORAL_TABLET | ORAL | Status: DC | PRN

## 2014-10-03 MED ORDER — BUPIVACAINE-EPINEPHRINE 0.25% -1:200000 IJ SOLN
INTRAMUSCULAR | Status: AC
  Filled 2014-10-03: qty 1

## 2014-10-03 MED ORDER — DEXAMETHASONE SODIUM PHOSPHATE 10 MG/ML IJ SOLN
10.0000 mg | Freq: Once | INTRAMUSCULAR | Status: AC
Start: 2014-10-04 — End: 2014-10-04
  Administered 2014-10-04: 10 mg via INTRAVENOUS
  Filled 2014-10-03: qty 1

## 2014-10-03 MED ORDER — ONDANSETRON HCL 4 MG PO TABS: 4.0000 mg | ORAL_TABLET | Freq: Four times a day (QID) | ORAL | Status: DC | PRN

## 2014-10-03 MED ORDER — MIDAZOLAM HCL 5 MG/5ML IJ SOLN
INTRAMUSCULAR | Status: DC | PRN
  Administered 2014-10-03: 2 mg via INTRAVENOUS

## 2014-10-03 MED ORDER — FENTANYL CITRATE (PF) 100 MCG/2ML IJ SOLN
INTRAMUSCULAR | Status: DC | PRN
  Administered 2014-10-03 (×2): 50 ug via INTRAVENOUS

## 2014-10-03 MED ORDER — ASPIRIN EC 325 MG PO TBEC: 325.0000 mg | DELAYED_RELEASE_TABLET | Freq: Two times a day (BID) | ORAL | Status: DC

## 2014-10-03 MED ORDER — SUCCINYLCHOLINE CHLORIDE 20 MG/ML IJ SOLN
INTRAMUSCULAR | Status: DC | PRN
  Administered 2014-10-03: 100 mg via INTRAVENOUS

## 2014-10-03 MED ORDER — STERILE WATER FOR IRRIGATION IR SOLN
Status: DC | PRN
  Administered 2014-10-03: 1500 mL

## 2014-10-03 MED ORDER — PROMETHAZINE HCL 25 MG/ML IJ SOLN: 6.2500 mg | INTRAMUSCULAR | Status: DC | PRN

## 2014-10-03 MED ORDER — DEXAMETHASONE SODIUM PHOSPHATE 10 MG/ML IJ SOLN
INTRAMUSCULAR | Status: AC
  Filled 2014-10-03: qty 1

## 2014-10-03 MED ORDER — CHLORHEXIDINE GLUCONATE 4 % EX LIQD: 60.0000 mL | Freq: Once | CUTANEOUS | Status: DC

## 2014-10-03 MED ORDER — HYDROGEN PEROXIDE 3 % EX SOLN
CUTANEOUS | Status: DC | PRN
Start: 2014-10-03 — End: 2014-10-03
  Administered 2014-10-03: 1

## 2014-10-03 MED ORDER — ACYCLOVIR 400 MG PO TABS
400.0000 mg | ORAL_TABLET | Freq: Two times a day (BID) | ORAL | Status: DC
  Administered 2014-10-03 – 2014-10-05 (×4): 400 mg via ORAL
  Filled 2014-10-03 (×5): qty 1

## 2014-10-03 MED ORDER — FENTANYL CITRATE (PF) 100 MCG/2ML IJ SOLN: 25.0000 ug | INTRAMUSCULAR | Status: DC | PRN

## 2014-10-03 MED ORDER — DOCUSATE SODIUM 100 MG PO CAPS: 100.0000 mg | ORAL_CAPSULE | Freq: Two times a day (BID) | ORAL | Status: DC

## 2014-10-03 MED ORDER — TRAZODONE HCL 50 MG PO TABS: 50.0000 mg | ORAL_TABLET | Freq: Every evening | ORAL | Status: DC | PRN

## 2014-10-03 MED ORDER — MELOXICAM 15 MG PO TABS: 15.0000 mg | ORAL_TABLET | Freq: Every day | ORAL | Status: DC

## 2014-10-03 MED ORDER — PHENOL 1.4 % MT LIQD: 1.0000 | OROMUCOSAL | Status: DC | PRN

## 2014-10-03 MED ORDER — DOCUSATE SODIUM 100 MG PO CAPS
100.0000 mg | ORAL_CAPSULE | Freq: Two times a day (BID) | ORAL | Status: DC
  Administered 2014-10-03 – 2014-10-05 (×4): 100 mg via ORAL

## 2014-10-03 MED ORDER — ROCURONIUM BROMIDE 100 MG/10ML IV SOLN
INTRAVENOUS | Status: DC | PRN
  Administered 2014-10-03: 30 mg via INTRAVENOUS

## 2014-10-03 MED ORDER — NEOSTIGMINE METHYLSULFATE 10 MG/10ML IV SOLN
INTRAVENOUS | Status: AC
  Filled 2014-10-03: qty 1

## 2014-10-03 MED ORDER — CEFAZOLIN SODIUM-DEXTROSE 2-3 GM-% IV SOLR
2.0000 g | INTRAVENOUS | Status: AC
  Administered 2014-10-03: 2 g via INTRAVENOUS

## 2014-10-03 MED ORDER — METOCLOPRAMIDE HCL 10 MG PO TABS: 5.0000 mg | ORAL_TABLET | Freq: Three times a day (TID) | ORAL | Status: DC | PRN

## 2014-10-03 MED ORDER — HYDROMORPHONE HCL 1 MG/ML IJ SOLN
INTRAMUSCULAR | Status: DC | PRN
  Administered 2014-10-03 (×2): 1 mg via INTRAVENOUS
  Administered 2014-10-03: 0.5 mg via INTRAVENOUS
  Administered 2014-10-03: 1 mg via INTRAVENOUS
  Administered 2014-10-03: 0.5 mg via INTRAVENOUS

## 2014-10-03 SURGICAL SUPPLY — 59 items
BAG ZIPLOCK 12X15 (MISCELLANEOUS) IMPLANT
BANDAGE ELASTIC 4 VELCRO ST LF (GAUZE/BANDAGES/DRESSINGS) ×2 IMPLANT
BANDAGE ELASTIC 6 VELCRO ST LF (GAUZE/BANDAGES/DRESSINGS) ×2 IMPLANT
BANDAGE ESMARK 6X9 LF (GAUZE/BANDAGES/DRESSINGS) ×1 IMPLANT
BLADE SAW RECIPROCATING 77.5 (BLADE) ×2 IMPLANT
BNDG ESMARK 6X9 LF (GAUZE/BANDAGES/DRESSINGS) ×2
CAPT KNEE TRIATH TK-4 ×2 IMPLANT
CHLORAPREP W/TINT 26ML (MISCELLANEOUS) ×4 IMPLANT
CUFF TOURN SGL QUICK 34 (TOURNIQUET CUFF) ×1
CUFF TRNQT CYL 34X4X40X1 (TOURNIQUET CUFF) ×1 IMPLANT
DECANTER SPIKE VIAL GLASS SM (MISCELLANEOUS) ×2 IMPLANT
DERMABOND ADVANCED (GAUZE/BANDAGES/DRESSINGS) ×1
DERMABOND ADVANCED .7 DNX12 (GAUZE/BANDAGES/DRESSINGS) ×1 IMPLANT
DRAPE EXTREMITY T 121X128X90 (DRAPE) ×2 IMPLANT
DRAPE LG THREE QUARTER DISP (DRAPES) ×8 IMPLANT
DRAPE POUCH INSTRU U-SHP 10X18 (DRAPES) ×2 IMPLANT
DRAPE U-SHAPE 47X51 STRL (DRAPES) ×2 IMPLANT
DRSG AQUACEL AG ADV 3.5X10 (GAUZE/BANDAGES/DRESSINGS) ×2 IMPLANT
DRSG TEGADERM 4X4.75 (GAUZE/BANDAGES/DRESSINGS) IMPLANT
ELECT PENCIL ROCKER SW 15FT (MISCELLANEOUS) ×2 IMPLANT
ELECT REM PT RETURN 15FT ADLT (MISCELLANEOUS) ×2 IMPLANT
EVACUATOR 1/8 PVC DRAIN (DRAIN) IMPLANT
FACESHIELD WRAPAROUND (MASK) ×6 IMPLANT
GAUZE SPONGE 4X4 12PLY STRL (GAUZE/BANDAGES/DRESSINGS) ×2 IMPLANT
GLOVE BIO SURGEON STRL SZ8.5 (GLOVE) ×8 IMPLANT
GLOVE BIOGEL PI IND STRL 8.5 (GLOVE) ×3 IMPLANT
GLOVE BIOGEL PI INDICATOR 8.5 (GLOVE) ×3
GOWN SPEC L3 XXLG W/TWL (GOWN DISPOSABLE) ×2 IMPLANT
HANDPIECE INTERPULSE COAX TIP (DISPOSABLE) ×1
HOOD PEEL AWAY FACE SHEILD DIS (HOOD) ×4 IMPLANT
KIT BASIN OR (CUSTOM PROCEDURE TRAY) ×2 IMPLANT
LIQUID BAND (GAUZE/BANDAGES/DRESSINGS) ×4 IMPLANT
MANIFOLD NEPTUNE II (INSTRUMENTS) ×2 IMPLANT
NEEDLE SPNL 18GX3.5 QUINCKE PK (NEEDLE) ×2 IMPLANT
PACK TOTAL JOINT (CUSTOM PROCEDURE TRAY) ×2 IMPLANT
PADDING CAST COTTON 6X4 STRL (CAST SUPPLIES) ×2 IMPLANT
PEN SKIN MARKING BROAD (MISCELLANEOUS) ×2 IMPLANT
POSITIONER SURGICAL ARM (MISCELLANEOUS) ×2 IMPLANT
SAW OSC TIP CART 19.5X105X1.3 (SAW) ×2 IMPLANT
SEALER BIPOLAR AQUA 6.0 (INSTRUMENTS) ×2 IMPLANT
SET HNDPC FAN SPRY TIP SCT (DISPOSABLE) ×1 IMPLANT
SET PAD KNEE POSITIONER (MISCELLANEOUS) ×2 IMPLANT
SOL PREP POV-IOD 4OZ 10% (MISCELLANEOUS) ×2 IMPLANT
SPONGE DRAIN TRACH 4X4 STRL 2S (GAUZE/BANDAGES/DRESSINGS) IMPLANT
SUCTION FRAZIER 12FR DISP (SUCTIONS) IMPLANT
SUT MNCRL AB 3-0 PS2 18 (SUTURE) ×2 IMPLANT
SUT MON AB 2-0 CT1 36 (SUTURE) ×4 IMPLANT
SUT VIC AB 1 CT1 36 (SUTURE) ×2 IMPLANT
SUT VIC AB 2-0 CT1 27 (SUTURE) ×1
SUT VIC AB 2-0 CT1 TAPERPNT 27 (SUTURE) ×1 IMPLANT
SUT VLOC 180 0 24IN GS25 (SUTURE) ×2 IMPLANT
SYR 50ML LL SCALE MARK (SYRINGE) ×2 IMPLANT
TOWEL OR 17X26 10 PK STRL BLUE (TOWEL DISPOSABLE) ×4 IMPLANT
TOWEL OR NON WOVEN STRL DISP B (DISPOSABLE) ×2 IMPLANT
TOWER CARTRIDGE SMART MIX (DISPOSABLE) IMPLANT
TRAY FOLEY W/METER SILVER 14FR (SET/KITS/TRAYS/PACK) ×2 IMPLANT
WATER STERILE IRR 1500ML POUR (IV SOLUTION) ×2 IMPLANT
WRAP KNEE MAXI GEL POST OP (GAUZE/BANDAGES/DRESSINGS) ×2 IMPLANT
YANKAUER SUCT BULB TIP 10FT TU (MISCELLANEOUS) ×2 IMPLANT

## 2014-10-03 NOTE — Anesthesia Procedure Notes (Signed)
Procedure Name: Intubation Date/Time: 10/03/2014 10:51 AM Performed by: Lind Covert Pre-anesthesia Checklist: Patient identified, Timeout performed, Emergency Drugs available, Suction available and Patient being monitored Patient Re-evaluated:Patient Re-evaluated prior to inductionOxygen Delivery Method: Circle system utilized Preoxygenation: Pre-oxygenation with 100% oxygen Intubation Type: IV induction Laryngoscope Size: Mac and 4 Grade View: Grade I Tube type: Oral Tube size: 7.5 mm Number of attempts: 1 Airway Equipment and Method: Stylet Placement Confirmation: ETT inserted through vocal cords under direct vision,  breath sounds checked- equal and bilateral and positive ETCO2 Secured at: 20 cm Tube secured with: Tape Dental Injury: Teeth and Oropharynx as per pre-operative assessment

## 2014-10-03 NOTE — Anesthesia Postprocedure Evaluation (Signed)
  Anesthesia Post-op Note  Patient: Susan [name]  Procedure(s) Performed: Procedure(s) (LRB): LEFT TOTAL KNEE ARTHROPLASTY (Left)  Patient Location: PACU  Anesthesia Type: General  Level of Consciousness: awake and alert   Airway and Oxygen Therapy: Patient Spontanous Breathing  Post-op Pain: mild  Post-op Assessment: Post-op Vital signs reviewed, Patient's Cardiovascular Status Stable, Respiratory Function Stable, Patent Airway and No signs of Nausea or vomiting  Last Vitals:  Filed Vitals:   10/03/14 1541  BP: 114/70  Pulse: 81  Temp: 36.7 C  Resp: 16    Post-op Vital Signs: stable   Complications: No apparent anesthesia complications

## 2014-10-03 NOTE — Transfer of Care (Signed)
Immediate Anesthesia Transfer of Care Note  Patient: Susan [name]  Procedure(s) Performed: Procedure(s): LEFT TOTAL KNEE ARTHROPLASTY (Left)  Patient Location: PACU  Anesthesia Type:General  Level of Consciousness: sedated  Airway & Oxygen Therapy: Patient Spontanous Breathing and Patient connected to face mask oxygen  Post-op Assessment: Report given to RN and Post -op Vital signs reviewed and stable  Post vital signs: Reviewed and stable  Last Vitals:  Filed Vitals:   10/03/14 0818  BP: 152/87  Pulse: 91  Temp: 36.3 C  Resp: 18    Complications: No apparent anesthesia complications

## 2014-10-03 NOTE — Anesthesia Preprocedure Evaluation (Signed)
Anesthesia Evaluation  Patient identified by MRN, date of birth, ID band Patient awake    Reviewed: Allergy & Precautions, H&P , NPO status , Patient's Chart, lab work & pertinent test results  Airway Mallampati: II  TM Distance: >3 FB Neck ROM: full    Dental no notable dental hx.    Pulmonary sleep apnea and Continuous Positive Airway Pressure Ventilation , former smoker,    Pulmonary exam normal       Cardiovascular Exercise Tolerance: Good negative cardio ROS Normal cardiovascular exam    Neuro/Psych negative neurological ROS  negative psych ROS   GI/Hepatic Neg liver ROS, PUD,   Endo/Other  negative endocrine ROS  Renal/GU negative Renal ROS  negative genitourinary   Musculoskeletal   Abdominal Normal abdominal exam  (+)   Peds  Hematology negative hematology ROS (+)   Anesthesia Other Findings   Reproductive/Obstetrics negative OB ROS                             Anesthesia Physical  Anesthesia Plan  ASA: II  Anesthesia Plan: General   Post-op Pain Management:    Induction: Intravenous  Airway Management Planned: Oral ETT  Additional Equipment:   Intra-op Plan:   Post-operative Plan: Extubation in OR  Informed Consent: I have reviewed the patients History and Physical, chart, labs and discussed the procedure including the risks, benefits and alternatives for the proposed anesthesia with the patient or authorized representative who has indicated his/her understanding and acceptance.   Dental Advisory Given  Plan Discussed with: CRNA and Surgeon  Anesthesia Plan Comments:         Anesthesia Quick Evaluation

## 2014-10-03 NOTE — Discharge Instructions (Signed)
° °Dr. Reverie Vaquera °Total Joint Specialist °[facility] °[address] °, [place] [address] °[phone] ° °TOTAL KNEE REPLACEMENT POSTOPERATIVE DIRECTIONS ° ° ° °Knee Rehabilitation, Guidelines Following Surgery  °Results after knee surgery are often greatly improved when you follow the exercise, range of motion and muscle strengthening exercises prescribed by your doctor. Safety measures are also important to protect the knee from further injury. Any time any of these exercises cause you to have increased pain or swelling in your knee joint, decrease the amount until you are comfortable again and slowly increase them. If you have problems or questions, call your caregiver or physical therapist for advice.  ° °WEIGHT BEARING °Weight bearing as tolerated with assist device (walker, cane, etc) as directed, use it as long as suggested by your surgeon or therapist, typically at least 4-6 weeks. ° °HOME CARE INSTRUCTIONS  °Remove items at home which could result in a fall. This includes throw rugs or furniture in walking pathways.  °Continue medications as instructed at time of discharge. °You may have some home medications which will be placed on hold until you complete the course of blood thinner medication.  °You may start showering once you are discharged home but do not submerge the incision under water. Just pat the incision dry and apply a dry gauze dressing on daily. °Walk with walker as instructed.  °You may resume a sexual relationship in one month or when given the OK by your doctor.  °· Use walker as long as suggested by your caregivers. °· Avoid periods of inactivity such as sitting longer than an hour when not asleep. This helps prevent blood clots.  °You may put full weight on your legs and walk as much as is comfortable.  °You may return to work once you are cleared by your doctor.  °Do not drive a car for 6 weeks or until released by you surgeon.  °· Do not drive  while taking narcotics.  °Wear the elastic stockings for three weeks following surgery during the day but you may remove then at night. °Make sure you keep all of your appointments after your operation with all of your doctors and caregivers. You should call the office at the above phone number and make an appointment for approximately two weeks after the date of your surgery. °Do not remove your surgical dressing. The dressing is waterproof; you may take showers in 3 days, but do not take tub baths or submerge the dressing. °Please pick up a stool softener and laxative for home use as long as you are requiring pain medications. °· ICE to the affected knee every three hours for 30 minutes at a time and then as needed for pain and swelling.  Continue to use ice on the knee for pain and swelling from surgery. You may notice swelling that will progress down to the foot and ankle.  This is normal after surgery.  Elevate the leg when you are not up walking on it.   °It is important for you to complete the blood thinner medication as prescribed by your doctor. °· Continue to use the breathing machine which will help keep your temperature down.  It is common for your temperature to cycle up and down following surgery, especially at night when you are not up moving around and exerting yourself.  The breathing machine keeps your lungs expanded and your temperature down. ° °RANGE OF MOTION AND STRENGTHENING EXERCISES  °Rehabilitation of the knee is important following   a knee injury or an operation. After just a few days of immobilization, the muscles of the thigh which control the knee become weakened and shrink (atrophy). Knee exercises are designed to build up the tone and strength of the thigh muscles and to improve knee motion. Often times heat used for twenty to thirty minutes before working out will loosen up your tissues and help with improving the range of motion but do not use heat for the first two weeks following  surgery. These exercises can be done on a training (exercise) mat, on the floor, on a table or on a bed. Use what ever works the best and is most comfortable for you Knee exercises include:  °Leg Lifts - While your knee is still immobilized in a splint or cast, you can do straight leg raises. Lift the leg to 60 degrees, hold for 3 sec, and slowly lower the leg. Repeat 10-20 times 2-3 times daily. Perform this exercise against resistance later as your knee gets better.  °Quad and Hamstring Sets - Tighten up the muscle on the front of the thigh (Quad) and hold for 5-10 sec. Repeat this 10-20 times hourly. Hamstring sets are done by pushing the foot backward against an object and holding for 5-10 sec. Repeat as with quad sets.  °A rehabilitation program following serious knee injuries can speed recovery and prevent re-injury in the future due to weakened muscles. Contact your doctor or a physical therapist for more information on knee rehabilitation.  ° °SKILLED REHAB INSTRUCTIONS: °If the patient is transferred to a skilled rehab facility following release from the hospital, a list of the current medications will be sent to the facility for the patient to continue.  When discharged from the skilled rehab facility, please have the facility set up the patient's Home Health Physical Therapy prior to being released. Also, the skilled facility will be responsible for providing the patient with their medications at time of release from the facility to include their pain medication, the muscle relaxants, and their blood thinner medication. If the patient is still at the rehab facility at time of the two week follow up appointment, the skilled rehab facility will also need to assist the patient in arranging follow up appointment in our office and any transportation needs. ° °MAKE SURE YOU:  °Understand these instructions.  °Will watch your condition.  °Will get help right away if you are not doing well or get worse.   ° ° °Pick up stool softner and laxative for home use following surgery while on pain medications. °Do NOT remove your dressing. You may shower.  °Do not take tub baths or submerge incision under water. °May shower starting three days after surgery. °Please use a clean towel to pat the incision dry following showers. °Continue to use ice for pain and swelling after surgery. °Do not use any lotions or creams on the incision until instructed by your surgeon. ° °

## 2014-10-03 NOTE — Discharge Summary (Signed)
Physician Discharge Summary  Patient ID: Susan [name] MRN: [id number] DOB/AGE: 09-13-1940 74 y.o.  Admit date: 10/03/2014 Discharge date: 10/05/2014  Admission Diagnoses:  Primary osteoarthritis of left knee  Discharge Diagnoses:  Principal Problem:   Primary osteoarthritis of left knee   Past Medical History  Diagnosis Date  . Diverticulosis of colon (without mention of hemorrhage)   . 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 Acute peptic ulcer, unspecified site, with hemorrhage and perforation, without mention of obstruction   . Chronic airway obstruction, not elsewhere classified   . Ventral hernia   . Hematochezia   . Arthritis     Surgeries: Procedure(s): LEFT TOTAL KNEE ARTHROPLASTY on 10/03/2014   Consultants (if any):    Discharged Condition: Improved  Hospital Course: Susan [name] is an 74 y.o. female who was admitted 10/03/2014 with a diagnosis of Primary osteoarthritis of left knee and went to the operating room on 10/03/2014 and underwent the above named procedures.    She was given perioperative antibiotics:      Anti-infectives    Start     Dose/Rate Route Frequency Ordered Stop   10/03/14 1700  ceFAZolin (ANCEF) IVPB 2 g/50 mL premix     2 g 100 mL/hr over 30 Minutes Intravenous Every 6 hours 10/03/14 1438 10/03/14 2307   10/03/14 1600  acyclovir (ZOVIRAX) tablet 400 mg  Status:  Discontinued     400 mg Oral 2 times daily 10/03/14 1438 10/05/14 1649   10/03/14 0822  ceFAZolin (ANCEF) IVPB 2 g/50 mL premix     2 g 100 mL/hr over 30 Minutes Intravenous On call to O.R. 10/03/14 3762 10/03/14 1056    .  She was given sequential compression devices, early ambulation, and ASA for DVT prophylaxis.  She benefited maximally from the hospital stay and there were no complications.    Recent vital signs:  Filed Vitals:   10/05/14 0510  BP: 143/80  Pulse: 92  Temp: 97.9 F (36.6 C)  Resp: 16    Recent laboratory studies:  Lab Results   Component Value Date   HGB 10.4* 10/05/2014   HGB 10.1* 10/04/2014   HGB 13.5 09/26/2014   Lab Results  Component Value Date   WBC 10.4 10/05/2014   PLT 148* 10/05/2014   Lab Results  Component Value Date   INR 1.02 09/26/2014   Lab Results  Component Value Date   NA 141 10/04/2014   K 3.4* 10/04/2014   CL 109 10/04/2014   CO2 23 10/04/2014   BUN 10 10/04/2014   CREATININE 0.56 10/04/2014   GLUCOSE 106* 10/04/2014    Discharge Medications:     Medication List    STOP taking these medications        acetaminophen 500 MG tablet  Commonly known as:  TYLENOL     ciprofloxacin 250 MG tablet  Commonly known as:  CIPRO     ibuprofen 200 MG tablet  Commonly known as:  ADVIL,MOTRIN     traMADol 50 MG tablet  Commonly known as:  ULTRAM      TAKE these medications        acyclovir 400 MG tablet  Commonly known as:  ZOVIRAX  TAKE ONE TABLET TWICE A DAY     aspirin EC 325 MG tablet  Take 1 tablet (325 mg total) by mouth 2 (two) times daily after a meal.     docusate sodium 100 MG capsule  Commonly known as:  COLACE  Take  1 capsule (100 mg total) by mouth 2 (two) times daily.     HYDROcodone-acetaminophen 5-325 MG per tablet  Commonly known as:  NORCO  Take 1-2 tablets by mouth every 4 (four) hours as needed for moderate pain.     meloxicam 15 MG tablet  Commonly known as:  MOBIC  Take 1 tablet (15 mg total) by mouth daily.     multivitamin with minerals Tabs tablet  Take 1 tablet by mouth daily.     ondansetron 8 MG disintegrating tablet  Commonly known as:  ZOFRAN ODT  Take 1 tablet (8 mg total) by mouth every 8 (eight) hours as needed for nausea or vomiting.     rOPINIRole 3 MG tablet  Commonly known as:  REQUIP  TAKE ONE TABLET AT BEDTIME     senna 8.6 MG Tabs tablet  Commonly known as:  SENOKOT  Take 2 tablets (17.2 mg total) by mouth at bedtime.     tiotropium 18 MCG inhalation capsule  Commonly known as:  SPIRIVA  Place 1 capsule (18 mcg  total) into inhaler and inhale daily.     traZODone 50 MG tablet  Commonly known as:  DESYREL  TAKE ONE AND ONE-HALF TABLETS AT BEDTIMEIF NEEDED FOR SLEEP        Diagnostic Studies: Ct Chest Wo Contrast  09/22/2014   CLINICAL DATA:  Followup right lower lobe pulmonary nodule. Short sharp breath exertion.  EXAM: CT CHEST WITHOUT CONTRAST  TECHNIQUE: Multidetector CT imaging of the chest was performed following the standard protocol without IV contrast.  COMPARISON:  04/22/2013 and 02/18/2013.  FINDINGS: Mediastinum/Nodes: Mediastinal lymph nodes are not enlarged by CT size criteria. Hilar regions are difficult to definitively evaluate without IV contrast. No axillary adenopathy. Atherosclerotic calcification of the arterial vasculature, including three-vessel involvement of the coronary arteries. Heart size normal. Anterior pericardial fluid or thickening.  Lungs/Pleura: Minimal biapical pleural parenchymal scarring. Centrilobular emphysema is mild. Subpleural reticular and ground-glass opacities are seen at the lung bases, possibly mildly progressive from 02/18/2013. 6 mm (4 x 7 mm) subpleural right lower lobe nodule (series 3, image 25) is unchanged from 02/18/2013. No pleural fluid. Airway is unremarkable.  Upper abdomen: Low-attenuation lesions in the visualized portion of the liver measure up to 1.8 cm, stable and likely cysts. Visualized portions of the liver and right adrenal gland are otherwise unremarkable. A 1.4 x 1.5 cm fluid density nodule is again seen in the left adrenal gland, stable from baseline examination of 02/18/2013. Visualized portions of the kidneys, spleen and stomach are grossly unremarkable. No upper abdominal adenopathy.  Musculoskeletal: No worrisome lytic or sclerotic lesions. Extensive degenerative change throughout the spine. Degenerative changes are also seen in the shoulders, right greater than left.  IMPRESSION: 1. 6 mm right lower lobe subpleural nodule is unchanged from  02/18/2013 and therefore considered a benign subpleural lymph node. 2. 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 3. Coronary artery calcification. 4. Left adrenal adenoma.   Electronically Signed   By: Lorin Picket M.D.   On: 09/22/2014 11:13   Dg Knee Left Port  10/03/2014   CLINICAL DATA:  Postop left total knee replacement  EXAM: PORTABLE LEFT KNEE - 1-2 VIEW  COMPARISON:  Radiograph 06/02/2014  FINDINGS: Interval total left knee arthroplasty. The femoral component and tibial and patellar component appear well seated. No fracture. Expected soft tissue change at the knee joint.  IMPRESSION: Post  left knee total arthroplasty without complication.   Electronically Signed   By: Suzy Bouchard M.D.   On: 10/03/2014 14:02    Disposition: 01-Home or Self Care  Discharge Instructions    Call MD / Call 911    Complete by:  As directed   If you experience chest pain or shortness of breath, CALL 911 and be transported to the hospital emergency room.  If you develope a fever above 101 F, pus (white drainage) or increased drainage or redness at the wound, or calf pain, call your surgeon's office.     Constipation Prevention    Complete by:  As directed   Drink plenty of fluids.  Prune juice may be helpful.  You may use a stool softener, such as Colace (over the counter) 100 mg twice a day.  Use MiraLax (over the counter) for constipation as needed.     Diet - low sodium heart healthy    Complete by:  As directed      Do not put a pillow under the knee. Place it under the heel.    Complete by:  As directed      Driving restrictions    Complete by:  As directed   No driving for 4 weeks     Increase activity slowly as tolerated    Complete by:  As directed      Lifting restrictions    Complete by:  As directed   No lifting for 6 weeks      TED hose    Complete by:  As directed   Use stockings (TED hose) for 2 weeks on both leg(s).  You may remove them at night for sleeping.           Follow-up Information    Follow up with Locklyn Henriquez, Horald Pollen, MD. Schedule an appointment as soon as possible for a visit in 2 weeks.   Specialty:  Orthopedic Surgery   Why:  For wound re-check   Contact information:   [address] [address] [phone]       Follow up with [facility]   Why:  Home Health Physical Therapy   Contact information:   [address] [place] [address] [phone]        Signed: Elie Goody 10/05/2014, 5:46 PM

## 2014-10-03 NOTE — H&P (View-Only) (Signed)
TOTAL KNEE ADMISSION H&P  Patient is being admitted for left total knee arthroplasty.  Subjective:  Chief Complaint:left knee pain.  HPI: Susan [name], 74 y.o. female, has a history of pain and functional disability in the left knee due to arthritis and has failed non-surgical conservative treatments for greater than 12 weeks to includeNSAID's and/or analgesics, corticosteriod injections, flexibility and strengthening excercises, supervised PT with diminished ADL's post treatment, use of assistive devices and activity modification.  Onset of symptoms was gradual, starting 1 years ago with gradually worsening course since that time. The patient noted no past surgery on the left knee(s).  Patient currently rates pain in the left knee(s) at 10 out of 10 with activity. Patient has night pain, worsening of pain with activity and weight bearing, pain that interferes with activities of daily living, pain with passive range of motion, crepitus and joint swelling.  Patient has evidence of subchondral cysts, subchondral sclerosis, periarticular osteophytes and joint space narrowing by imaging studies. There is no active infection.  Patient Active Problem List   Diagnosis Date Noted  . Acute cystitis with hematuria 09/09/2014  . Primary osteoarthritis of right knee 08/08/2014  . Elevated blood-pressure reading without diagnosis of hypertension 07/31/2014  . Osteoarthritis of right knee, Severe 10/28/2013  . COPD exacerbation 10/22/2013  . Glenohumeral arthritis, Severe 08/20/2013  . Fracture of superior pubic ramus 04/23/2013  . 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 Solitary pulmonary nodule 02/19/2013  . Chronic knee pain 01/15/2013  . Obstructive sleep apnea 10/11/2012  . Umbilical hernia 77/82/4235  . Urinary incontinence, urge 01/17/2012  . Diastolic dysfunction 36/14/4315  . Preoperative cardiovascular examination 04/21/2011  . DDD (degenerative disc disease) 10/26/2010  . RESTLESS LEG SYNDROME  02/23/2010  . HIP REPLACEMENT, LEFT, HX OF 12/21/2009  . FOOT PAIN, BILATERAL 03/25/2009  . HYPERCHOLESTEROLEMIA 06/18/2008  . ALLERGIC RHINITIS 05/21/2008  . ANKLE PAIN, BILATERAL 05/21/2008  . DIVERTICULOSIS, COLON 11/26/2007  . HERPES SIMPLEX INFECTION, RECURRENT 10/17/2007   Past Medical History  Diagnosis Date  . Diverticulosis of colon (without mention of hemorrhage)   . 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 Acute peptic ulcer, unspecified site, with hemorrhage and perforation, without mention of obstruction   . Chronic airway obstruction, not elsewhere classified   . Ventral hernia   . Hematochezia   . Arthritis     Past Surgical History  Procedure Laterality Date  . Bunionectomy Bilateral   . Total hip arthroplasty  2007    left  . Appendectomy    . Tubal ligation    . Cosmetic surgery  1970's    abdomen. Tummy Tuck  . Foot surgery Bilateral dec 2015 right     left 3 yrs ago  . Total hip arthroplasty      bilateral   . Total knee arthroplasty Right 08/08/2014    Procedure: RIGHT TOTAL KNEE ARTHROPLASTY WITH NAVIGATION;  Surgeon: Rod Can, MD;  Location: [facility];  Service: Orthopedics;  Laterality: Right;     (Not in a hospital admission) No Known Allergies  History  Substance Use Topics  . Smoking status: Former Smoker -- 0.50 packs/day for 40 years    Types: Cigarettes    Quit date: 08/29/2000  . Smokeless tobacco: Never Used  . Alcohol Use: Yes     Comment: rare    Family History  Problem Relation Age of Onset  . Diabetes Mother   . Coronary artery disease Mother   . Lung disease Mother   .  Heart disease Mother     CAD  . Coronary artery disease Father   . Kidney disease Father   . Diabetes Father   . Heart disease Father     CAD  . Cancer Neg Hx      Review of Systems  Constitutional: Negative.   HENT: Negative.   Eyes: Negative.   Respiratory: Negative.   Cardiovascular: Negative.   Gastrointestinal: Negative.    Genitourinary: Negative.   Musculoskeletal: Positive for joint pain.  Skin: Negative.   Endo/Heme/Allergies: Negative.   Psychiatric/Behavioral: Negative.     Objective:  Physical Exam  Constitutional: She is oriented to person, place, and time. She appears well-developed and well-nourished.  HENT:  Head: Normocephalic and atraumatic.  Neck: Normal range of motion. Neck supple.  Cardiovascular: Normal rate, regular rhythm, normal heart sounds and intact distal pulses.   Respiratory: Effort normal and breath sounds normal. No respiratory distress.  GI: Soft. Bowel sounds are normal. She exhibits no distension.  Genitourinary:  deferred  Neurological: She is alert and oriented to person, place, and time. She has normal reflexes.  Skin: Skin is warm and dry.  Psychiatric: She has a normal mood and affect. Her behavior is normal. Judgment and thought content normal.    Vital signs in last 24 hours: @VSRANGES @  Labs:   Estimated body mass index is 38.28 kg/(m^2) as calculated from the following:   Height as of 09/26/14: 5' (1.524 m).   Weight as of 09/26/14: 88.905 kg (196 lb).   Imaging Review Plain radiographs demonstrate severe degenerative joint disease of the left knee(s). The overall alignment ismild valgus. The bone quality appears to be adequate for age and reported activity level.  Assessment/Plan:  End stage arthritis, left knee   The patient history, physical examination, clinical judgment of the provider and imaging studies are consistent with end stage degenerative joint disease of the left knee(s) and total knee arthroplasty is deemed medically necessary. The treatment options including medical management, injection therapy arthroscopy and arthroplasty were discussed at length. The risks and benefits of total knee arthroplasty were presented and reviewed. The risks due to aseptic loosening, infection, stiffness, patella tracking problems, thromboembolic complications  and other imponderables were discussed. The patient acknowledged the explanation, agreed to proceed with the plan and consent was signed. Patient is being admitted for inpatient treatment for surgery, pain control, PT, OT, prophylactic antibiotics, VTE prophylaxis, progressive ambulation and ADL's and discharge planning. The patient is planning to be discharged home with home health services

## 2014-10-03 NOTE — Op Note (Signed)
OPERATIVE REPORT  SURGEON: Rod Can, MD.   ASSISTANT: Molli Barrows, PA-C.  PREOPERATIVE DIAGNOSIS: Left knee arthritis.   POSTOPERATIVE DIAGNOSIS: Left knee arthritis.   PROCEDURE: Left total knee arthroplasty.   IMPLANTS: Stryker Triathlon CR femur, size 2. Stryker Triathlon Tritanium tibia, size 3. X3 polyethelyene insert, size 11 mm, CR. Tritanium 3 button asymmetric patella, size 32 mm.  ANESTHESIA:  General  TOURNIQUET TIME: not utilized.  ESTIMATED BLOOD LOSS: 350 mL.  ANTIBIOTICS: 2g ancef.  DRAINS: None.  COMPLICATIONS: None   CONDITION: PACU - hemodynamically stable.   BRIEF CLINICAL NOTE: Susan [name] is a 74 y.o. female with a long-standing history of Left knee arthritis. After failing conservative management, the patient was indicated for total knee arthroplasty. The risks, benefits, and alternatives to the procedure were explained, and the patient elected to proceed.  PROCEDURE IN DETAIL: Spinal anesthesia was obtained in the pre-op holding area. Once inside the operative room, a foley catheter was inserted. The patient was then positioned, a nonsterile tourniquet was placed, and the lower extremity was prepped and draped in the normal sterile surgical fashion. A time-out was called verifying side and site of surgery. The patient received IV antibiotics within 60 minutes of beginning the procedure. The tourniquet was not utilized.  An anterior approach to the knee was performed utilizing a midvastus arthrotomy. A medial release was performed and the patellar fat pad was excised. A 6-degree distal femur valgus cut was made with an intramedullary guide. A freehand patellar resection was performed, and the patella was sized an prepared with 3 lug holes.  The proximal tibial resection was performed using an extramedullary guide, leaving a bone island for the PCL. The menisci were excised. A spacer block was placed, and the alignment and balance in  extension were confirmed.   The distal femur was sized using the 3-degree external rotation guide referencing the posterior femoral cortex. The appropriate 4-in-1 cutting block was pinned into place, and the rotation was checked using a spacer block at 90 degrees of flexion. The remaining femoral cuts were performed, taking care to protect the MCL.  The tibia was sized and the trial tray was pinned into place. The remaining trail components were inserted. The knee was stable to varus and valgus stress through a full range of motion. The patella tracked centrally, and the PCL was well balanced. The trial components were removed, and the proximal tibial surface was prepared. Final components were impacted into place. The knee was tested for a final time and found to be well balanced.  The wound was copiously irrigated with a dilute betadine solution followed by normal saline with pulse lavage. Marcaine solution was injected into the periarticular soft tissue. The wound was closed in layers using #1 Vicryl and V-Loc for the fascia, 2-0 Vicryl for the subcutaneous fat, 2-0 Monocryl for the deep dermal layer, 3-0 running Monocryl subcuticular Stitch, and Dermabond for the skin. Once the glue was fully dried, an Aquacell Ag and compressive dressing were applied. The patient was transported to the recovery room in stable ondition. Sponge, needle, and instrument counts were correct at the end of the case x2. The patient tolerated the procedure well and there were no known complications.  Please note that a surgical assistant was a medical necessity for this procedure in order to perform it in a safe and expeditious manner. Surgical assistant was necessary to retract the ligaments and vital neurovascular structures to prevent injury to them and also necessary for proper  positioning of the limb to allow for anatomic placement of the prosthesis.

## 2014-10-03 NOTE — Interval H&P Note (Signed)
History and Physical Interval Note:  10/03/2014 9:58 AM  Akemi I Hingle  has presented today for surgery, with the diagnosis of LEFT KNEE OA  The various methods of treatment have been discussed with the patient and family. After consideration of risks, benefits and other options for treatment, the patient has consented to  Procedure(s): LEFT TOTAL KNEE ARTHROPLASTY (Left) as a surgical intervention .  The patient's history has been reviewed, patient examined, no change in status, stable for surgery.  I have reviewed the patient's chart and labs.  Questions were answered to the patient's satisfaction.     Tryphena Perkovich, Horald Pollen

## 2014-10-04 LAB — BASIC METABOLIC PANEL
Anion gap: 9 (ref 5–15)
BUN: 10 mg/dL (ref 6–20)
CO2: 23 mmol/L (ref 22–32)
Calcium: 8 mg/dL — ABNORMAL LOW (ref 8.9–10.3)
Chloride: 109 mmol/L (ref 101–111)
Creatinine, Ser: 0.56 mg/dL (ref 0.44–1.00)
GFR calc Af Amer: >60 mL/min (ref >60)
GFR calc non Af Amer: >60 mL/min (ref >60)
Glucose, Bld: 106 mg/dL — ABNORMAL HIGH (ref 65–99)
Potassium: 3.4 mmol/L — ABNORMAL LOW (ref 3.5–5.1)
Sodium: 141 mmol/L (ref 135–145)

## 2014-10-04 LAB — CBC
HCT: 32.3 % — ABNORMAL LOW (ref 36.0–46.0)
Hemoglobin: 10.1 g/dL — ABNORMAL LOW (ref 12.0–15.0)
MCH: 29.2 pg (ref 26.0–34.0)
MCHC: 31.3 g/dL (ref 30.0–36.0)
MCV: 93.4 fL (ref 78.0–100.0)
PLATELETS: 147 10*3/uL — AB (ref 150–400)
RBC: 3.46 MIL/uL — AB (ref 3.87–5.11)
RDW: 14.1 % (ref 11.5–15.5)
WBC: 10.2 10*3/uL (ref 4.0–10.5)

## 2014-10-04 MED ORDER — POLYETHYLENE GLYCOL 3350 17 G PO PACK
17.0000 g | PACK | Freq: Every day | ORAL | Status: DC
  Administered 2014-10-04 (×2): 17 g via ORAL

## 2014-10-04 NOTE — Evaluation (Signed)
Physical Therapy Evaluation Patient Details Name: Susan [name] MRN: [id number] DOB: [birth year] Today's Date: 10/04/2014   History of Present Illness  L TKR  Clinical Impression  Patient tolerated very well. Plans Dc tomorrow. Patient will benefit from PT to address problems listed in note below    Follow Up Recommendations Home health PT;Supervision/Assistance - 24 hour    Equipment Recommendations  None recommended by PT    Recommendations for Other Services       Precautions / Restrictions Precautions Precautions: Fall;Knee Restrictions Weight Bearing Restrictions: No      Mobility  Bed Mobility Overal bed mobility: Needs Assistance Bed Mobility: Supine to Sit     Supine to sit: Supervision     General bed mobility comments: cues for safety  Transfers Overall transfer level: Needs assistance Equipment used: Rolling walker (2 wheeled) Transfers: Sit to/from Stand Sit to Stand: Min guard         General transfer comment: cues for safety, LLE position  Ambulation/Gait Ambulation/Gait assistance: Min assist Ambulation Distance (Feet): 150 Feet (then 30') Assistive device: Rolling walker (2 wheeled) Gait Pattern/deviations: Step-to pattern;Step-through pattern;Trunk flexed     General Gait Details: cues for sequence and for posture.  Stairs            Wheelchair Mobility    Modified Rankin (Stroke Patients Only)       Balance                                             Pertinent Vitals/Pain Pain Assessment: 0-10 Pain Score: 3  Pain Descriptors / Indicators: Aching Pain Intervention(s): Limited activity within patient's tolerance;Premedicated before session;Ice applied    Home Living Family/patient expects to be discharged to:: Private residence Living Arrangements: Alone Available Help at Discharge: Family;Friend(s) Type of Home: Apartment Home Access: Level entry;Elevator     Home Layout: One level Home  Equipment: [place] - 2 wheels;Cane - single point;Electric scooter Additional Comments: Pt states she can have 24/7 assist initially    Prior Function Level of Independence: Independent with assistive device(s)               Hand Dominance   Dominant Hand: Right    Extremity/Trunk Assessment               Lower Extremity Assessment: LLE deficits/detail   LLE Deficits / Details: able to perform SLR, jnee flexion 80 degrees.     Communication   Communication: No difficulties  Cognition Arousal/Alertness: Awake/alert Behavior During Therapy: WFL for tasks assessed/performed Overall Cognitive Status: Within Functional Limits for tasks assessed                      General Comments      Exercises Total Joint Exercises Straight Leg Raises: AROM;Left;10 reps;Supine      Assessment/Plan    PT Assessment Patient needs continued PT services  PT Diagnosis Difficulty walking;Acute pain   PT Problem List Decreased strength;Decreased range of motion;Decreased activity tolerance;Decreased mobility;Decreased knowledge of precautions;Decreased safety awareness;Pain  PT Treatment Interventions DME instruction;Gait training;Stair training;Functional mobility training;Therapeutic activities;Therapeutic exercise;Patient/family education   PT Goals (Current goals can be found in the Care Plan section) Acute Rehab PT Goals Patient Stated Goal: to go home tomorrow PT Goal Formulation: With patient Time For Goal Achievement: 10/07/14 Potential to Achieve Goals: Good    Frequency 7X/week  Barriers to discharge        Co-evaluation               End of Session   Activity Tolerance: Patient tolerated treatment well Patient left: in chair;with call bell/phone within reach Nurse Communication: Mobility status         Time: 2620-3559 PT Time Calculation (min) (ACUTE ONLY): 31 min   Charges:   PT Evaluation $Initial PT Evaluation Tier I: 1 Procedure PT  Treatments $Gait Training: 8-22 mins   PT G Codes:        Claretha Cooper 10/04/2014, 10:42 AM

## 2014-10-04 NOTE — Care Management Note (Addendum)
Case Management Note  Patient Details  Name: JAYNA MULNIX MRN: [id number] Date of Birth: 08/04/1940  Subjective/Objective:                    Action/Plan:   Expected Discharge Date:  10/04/14               Expected Discharge Plan:  [place]  In-House Referral:     Discharge planning Services  CM Consult  Post Acute Care Choice:    Choice offered to:     DME Arranged:    DME Agency:     HH Arranged:  PT HH Agency:  [facility]  Status of Service:  Completed, signed off  Medicare Important Message Given:  No Date Medicare IM Given:    Medicare IM give by:    Date Additional Medicare IM Given:    Additional Medicare Important Message give by:     If discussed at [place] of Stay Meetings, dates discussed:    Additional Comments: NCM spoke to pt and no DME needed. She has RW and 3n1 at home. Home Health offered and pt agreeable to [facility]. Scheduled dc home on 5/15. Erenest Rasher, RN 10/04/2014, 2:27 PM   Address : [place] [address] [place], [place] [address]

## 2014-10-04 NOTE — Progress Notes (Signed)
Physical Therapy Treatment Patient Details Name: Susan [name] MRN: [id number] DOB: [birth year] Today's Date: 10/04/2014    History of Present Illness L TKR    PT Comments    Doing very well with ROM, ambulation  Follow Up Recommendations  Home health PT;Supervision/Assistance - 24 hour     Equipment Recommendations       Recommendations for Other Services       Precautions / Restrictions Precautions Precautions: Fall;Knee    Mobility  Bed Mobility Overal bed mobility: Needs Assistance Bed Mobility: Supine to Sit     Supine to sit: Modified independent (Device/Increase time)     General bed mobility comments: cues for safety  Transfers Overall transfer level: Needs assistance Equipment used: Rolling walker (2 wheeled) Transfers: Sit to/from Stand Sit to Stand: Supervision         General transfer comment: cues for safety, LLE position  Ambulation/Gait Ambulation/Gait assistance: Min guard Ambulation Distance (Feet): 150 Feet Assistive device: Rolling walker (2 wheeled)           Stairs            Wheelchair Mobility    Modified Rankin (Stroke Patients Only)       Balance                                    Cognition Arousal/Alertness: Awake/alert Behavior During Therapy: Impulsive Overall Cognitive Status: Within Functional Limits for tasks assessed                      Exercises Total Joint Exercises Ankle Circles/Pumps: AROM;Both;Supine Quad Sets: AROM;Both;Supine Short Arc Quad: AROM;Left;Supine Heel Slides: AROM;Left;Supine Hip ABduction/ADduction: AROM;Left;Supine Straight Leg Raises: AROM;Left;Supine    General Comments        Pertinent Vitals/Pain Pain Score: 1  Pain Location: L knee Pain Descriptors / Indicators: Discomfort Pain Intervention(s): Premedicated before session;Ice applied    Home Living Family/patient expects to be discharged to:: Private residence Living Arrangements:  Alone Available Help at Discharge: Family;Friend(s) Type of Home: Apartment Home Access: Level entry;Elevator   Home Layout: One level Home Equipment: [place] - 2 wheels;Cane - single point;Electric scooter Additional Comments: Pt states she can have 24/7 assist initially    Prior Function Level of Independence: Independent with assistive device(s)          PT Goals (current goals can now be found in the care plan section) Acute Rehab PT Goals Patient Stated Goal: to go home tomorrow Progress towards PT goals: Progressing toward goals    Frequency  7X/week    PT Plan Current plan remains appropriate    Co-evaluation             End of Session   Activity Tolerance: Patient tolerated treatment well Patient left: in chair;with call bell/phone within reach     Time: 9937-1696 PT Time Calculation (min) (ACUTE ONLY): 16 min  Charges:  $Gait Training: 8-22 mins                    G Codes:      Claretha Cooper 10/04/2014, 4:44 PM

## 2014-10-04 NOTE — Progress Notes (Signed)
   Subjective: 1 Day Post-Op Procedure(s) (LRB): LEFT TOTAL KNEE ARTHROPLASTY (Left)  Pt doing well this morning Mild pain to left knee  Denies any new issues or symptoms  Probable d/c home tomorrow Patient reports pain as mild.  Objective:   VITALS:   Filed Vitals:   10/04/14 0521  BP: 153/73  Pulse: 88  Temp: 97.7 F (36.5 C)  Resp: 16    Left knee dressing in place Ace removed nv intact distally No drainage or erythema  LABS  Recent Labs  10/04/14 0515  HGB 10.1*  HCT 32.3*  WBC 10.2  PLT 147*     Recent Labs  10/04/14 0515  NA 141  K 3.4*  BUN 10  CREATININE 0.56  GLUCOSE 106*     Assessment/Plan: 1 Day Post-Op Procedure(s) (LRB): LEFT TOTAL KNEE ARTHROPLASTY (Left) PT/OT Pain management as needed D/c planning for tomorrow    Merla Riches, MPAS, PA-C  10/04/2014, 7:13 AM

## 2014-10-04 NOTE — Evaluation (Signed)
Occupational Therapy Evaluation Patient Details Name: Susan [name] MRN: [id number] DOB: 1940-09-03 Today's Date: 10/04/2014    History of Present Illness L TKR   Clinical Impression   No further OT needed    Follow Up Recommendations  No OT follow up    Equipment Recommendations  None recommended by OT    Recommendations for Other Services       Precautions / Restrictions Precautions Precautions: Fall;Knee      Mobility Bed Mobility Overal bed mobility: Needs Assistance Bed Mobility: Supine to Sit     Supine to sit: Supervision     General bed mobility comments: cues for safety  Transfers Overall transfer level: Needs assistance Equipment used: Rolling walker (2 wheeled) Transfers: Sit to/from Stand Sit to Stand: Supervision         General transfer comment: cues for safety, LLE position         ADL Overall ADL's : Needs assistance/impaired                     Lower Body Dressing: Supervision/safety;Sit to/from stand;Cueing for safety   Toilet Transfer: Supervision/safety;RW;Ambulation;Comfort height toilet   Toileting- Clothing Manipulation and Hygiene: Supervision/safety;Sit to/from stand         General ADL Comments: Pt close to baseline with ADL activity . Pt able to verbalize strategies for bathing and dressing, as well as walker safety.  Pt will intiaitlly have A at home.               Pertinent Vitals/Pain Pain Assessment: 0-10 Pain Score: 2  Pain Location: l knee Pain Descriptors / Indicators: Sore Pain Intervention(s): Limited activity within patient's tolerance;Monitored during session;Repositioned;Ice applied     Hand Dominance Right   Extremity/Trunk Assessment Upper Extremity Assessment Upper Extremity Assessment: Overall WFL for tasks assessed   Lower Extremity Assessment Lower Extremity Assessment: LLE deficits/detail LLE Deficits / Details: able to perform SLR, jnee flexion 80 degrees.        Communication Communication Communication: No difficulties   Cognition Arousal/Alertness: Awake/alert Behavior During Therapy: WFL for tasks assessed/performed Overall Cognitive Status: Within Functional Limits for tasks assessed                     General Comments    Has all needed DME   Exercises       Shoulder Instructions      Home Living Family/patient expects to be discharged to:: Private residence Living Arrangements: Alone Available Help at Discharge: Family;Friend(s) Type of Home: Apartment Home Access: Level entry;Elevator     Home Layout: One level               Home Equipment: Walker - 2 wheels;Cane - single point;Electric scooter   Additional Comments: Pt states she can have 24/7 assist initially      Prior Functioning/Environment Level of Independence: Independent with assistive device(s)                      OT Goals(Current goals can be found in the care plan section) Acute Rehab OT Goals Patient Stated Goal: to go home tomorrow  OT Frequency:     Barriers to D/C:            Co-evaluation              End of Session Equipment Utilized During Treatment: Rolling walker  Activity Tolerance: Patient tolerated treatment well Patient left: in bed;with call bell/phone within reach  Time: 0630-1601 OT Time Calculation (min): 18 min Charges:  OT General Charges $OT Visit: 1 Procedure OT Evaluation $Initial OT Evaluation Tier I: 1 Procedure G-Codes:    Betsy Pries 18-Oct-2014, 1:10 PM

## 2014-10-05 LAB — CBC
HEMATOCRIT: 33.1 % — AB (ref 36.0–46.0)
Hemoglobin: 10.4 g/dL — ABNORMAL LOW (ref 12.0–15.0)
MCH: 29.1 pg (ref 26.0–34.0)
MCHC: 31.4 g/dL (ref 30.0–36.0)
MCV: 92.7 fL (ref 78.0–100.0)
Platelets: 148 10*3/uL — ABNORMAL LOW (ref 150–400)
RBC: 3.57 MIL/uL — AB (ref 3.87–5.11)
RDW: 14.1 % (ref 11.5–15.5)
WBC: 10.4 10*3/uL (ref 4.0–10.5)

## 2014-10-05 NOTE — Progress Notes (Addendum)
Physical Therapy Treatment Patient Details Name: LANCE GALAS MRN: [id number] DOB: 1940-05-27 Today's Date: 10/05/2014    History of Present Illness L TKR    PT Comments    Ready for Dc.  Follow Up Recommendations  Home health PT;Supervision/Assistance - 24 hour     Equipment Recommendations  None recommended by PT    Recommendations for Other Services       Precautions / Restrictions Precautions Precautions: Fall;Knee Restrictions Weight Bearing Restrictions: No    Mobility  Bed Mobility                  Transfers Overall transfer level: Modified independent Equipment used: Rolling walker (2 wheeled) Transfers: Sit to/from Stand              Ambulation/Gait Ambulation/Gait assistance: Supervision Ambulation Distance (Feet): 20 Feet Assistive device: Rolling walker (2 wheeled)       General Gait Details: cues for sequence and for posture.   Stairs            Wheelchair Mobility    Modified Rankin (Stroke Patients Only)       Balance                                    Cognition                            Exercises Total Joint Exercises Ankle Circles/Pumps: AROM;Both;Supine Quad Sets: AROM;Both;Supine Short Arc Quad: AROM;Left;Supine Heel Slides: AROM;Left;Supine Hip ABduction/ADduction: AROM;Left;Supine Straight Leg Raises: AROM;Left;Supine Goniometric ROM: 5-90 L knee    General Comments        Pertinent Vitals/Pain Pain Score: 3  Pain Location: L knee Pain Descriptors / Indicators: Discomfort    Home Living                      Prior Function            PT Goals (current goals can now be found in the care plan section) Progress towards PT goals: Progressing toward goals    Frequency  7X/week    PT Plan Current plan remains appropriate    Co-evaluation             End of Session   Activity Tolerance: Patient tolerated treatment well Patient left: in  chair     Time: 8115-7262 PT Time Calculation (min) (ACUTE ONLY): 9 min  Charges:  $                   G Codes:      Claretha Cooper 10/05/2014, 11:57 AM

## 2014-10-05 NOTE — Progress Notes (Signed)
     Subjective: 2 Days Post-Op Procedure(s) (LRB): LEFT TOTAL KNEE ARTHROPLASTY (Left)   Patient reports pain as mild, pain controlled. Little soreness. No events throughout the night. Ready to be discharged home.  Objective:   VITALS:   Filed Vitals:   10/05/14 0510  BP: 143/80  Pulse: 92  Temp: 97.9 F (36.6 C)  Resp: 16    Dorsiflexion/Plantar flexion intact Incision: dressing C/D/I No cellulitis present Compartment soft  LABS  Recent Labs  10/04/14 0515 10/05/14 0450  HGB 10.1* 10.4*  HCT 32.3* 33.1*  WBC 10.2 10.4  PLT 147* 148*     Recent Labs  10/04/14 0515  NA 141  K 3.4*  BUN 10  CREATININE 0.56  GLUCOSE 106*     Assessment/Plan: 2 Days Post-Op Procedure(s) (LRB): LEFT TOTAL KNEE ARTHROPLASTY (Left) Up with therapy Discharge home with home health Follow up in 2 weeks at [facility]. Follow up with Dr. Lyla Glassing in 2 weeks.  Contact information:  [facility] [address], [address] [place] Josip Merolla   PAC  10/05/2014, 8:15 AM

## 2014-10-05 NOTE — Plan of Care (Signed)
Problem: Discharge Progression Outcomes Goal: Anticoagulant follow-up in place Outcome: Not Applicable Date Met:  59/47/07 asa

## 2014-10-05 NOTE — Progress Notes (Signed)
Discharged from floor via w/c, belongings & family with pt. No changes in assessment. Aubrie Lucien  

## 2014-10-05 NOTE — Progress Notes (Signed)
Physical Therapy Treatment Patient Details Name: Susan [name] MRN: [id number] DOB: [birth year] Today's Date: 10/05/2014    History of Present Illness L TKR    PT Comments    Pt plans Dc today after PT, continues to do well with ROM.  Follow Up Recommendations  Home health PT;Supervision/Assistance - 24 hour     Equipment Recommendations  None recommended by PT    Recommendations for Other Services       Precautions / Restrictions Restrictions Weight Bearing Restrictions: No    Mobility  Bed Mobility                  Transfers                    Ambulation/Gait                 Stairs            Wheelchair Mobility    Modified Rankin (Stroke Patients Only)       Balance                                    Cognition                            Exercises Total Joint Exercises Ankle Circles/Pumps: AROM;Both;Supine Quad Sets: AROM;Both;Supine Short Arc Quad: AROM;Left;Supine Heel Slides: AROM;Left;Supine Hip ABduction/ADduction: AROM;Left;Supine Straight Leg Raises: AROM;Left;Supine Goniometric ROM: 5-90 L knee    General Comments        Pertinent Vitals/Pain      Home Living                      Prior Function            PT Goals (current goals can now be found in the care plan section) Progress towards PT goals: Progressing toward goals    Frequency  7X/week    PT Plan Current plan remains appropriate    Co-evaluation             End of Session   Activity Tolerance: Patient tolerated treatment well Patient left: in bed;with call bell/phone within reach     Time: 7939-0300 PT Time Calculation (min) (ACUTE ONLY): 11 min  Charges:  $Therapeutic Exercise: 8-22 mins                    G Codes:      Claretha Cooper 10/05/2014, 10:15 AM

## 2014-10-06 ENCOUNTER — Encounter: Payer: PPO | Admitting: Physical Therapy

## 2014-10-06 ENCOUNTER — Encounter (HOSPITAL_COMMUNITY): Payer: Self-pay | Admitting: Orthopedic Surgery

## 2014-10-09 ENCOUNTER — Encounter: Payer: PPO | Admitting: Physical Therapy

## 2014-10-13 ENCOUNTER — Encounter: Payer: PPO | Admitting: Physical Therapy

## 2014-10-14 ENCOUNTER — Other Ambulatory Visit: Payer: PPO

## 2014-10-16 ENCOUNTER — Encounter: Payer: PPO | Admitting: Physical Therapy

## 2014-10-17 ENCOUNTER — Ambulatory Visit: Payer: PPO | Admitting: Family Medicine

## 2014-10-18 ENCOUNTER — Other Ambulatory Visit: Payer: Self-pay | Admitting: Family Medicine

## 2014-10-18 NOTE — Telephone Encounter (Signed)
Last office visit 09/19/2014.  Last refilled 09/15/2014 for #45 with no refills.  Ok to refill?

## 2014-10-21 ENCOUNTER — Encounter: Payer: PPO | Admitting: Family Medicine

## 2014-10-27 ENCOUNTER — Encounter: Payer: Self-pay | Admitting: Physical Therapy

## 2014-10-27 ENCOUNTER — Ambulatory Visit: Payer: PPO | Attending: Orthopedic Surgery | Admitting: Physical Therapy

## 2014-10-27 DIAGNOSIS — R262 Difficulty in walking, not elsewhere classified: Secondary | ICD-10-CM | POA: Diagnosis present

## 2014-10-27 DIAGNOSIS — M6281 Muscle weakness (generalized): Secondary | ICD-10-CM | POA: Diagnosis present

## 2014-10-27 DIAGNOSIS — M25562 Pain in left knee: Secondary | ICD-10-CM

## 2014-10-28 NOTE — Therapy (Signed)
[facility] [address], [place], [address] Phone: [phone]   Fax:  [phone]  Physical Therapy Evaluation  Patient Details  Name: Susan [name] MRN: [id number] Date of Birth: 1941-01-08 Referring Provider:  Rod Can, MD  Encounter Date: 10/27/2014      PT End of Session - 10/27/14 1905    Visit Number 1   Number of Visits 12   Date for PT Re-Evaluation 12/08/14   Authorization Type 1   Authorization Time Period 10   PT Start Time 1236   PT Stop Time 1330   PT Time Calculation (min) 54 min   Activity Tolerance Patient tolerated treatment well;No increased pain   Behavior During Therapy [facility] for tasks assessed/performed      Past Medical History  Diagnosis Date  . Diverticulosis of colon (without mention of hemorrhage)   . 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 Acute peptic ulcer, unspecified site, with hemorrhage and perforation, without mention of obstruction   . Chronic airway obstruction, not elsewhere classified   . Ventral hernia   . Hematochezia   . Arthritis     Past Surgical History  Procedure Laterality Date  . Bunionectomy Bilateral   . Total hip arthroplasty  2007    left  . Appendectomy    . Tubal ligation    . Cosmetic surgery  1970's    abdomen. Tummy Tuck  . Foot surgery Bilateral dec 2015 right     left 3 yrs ago  . Total hip arthroplasty      bilateral   . 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 Total knee arthroplasty Left 10/03/2014    Procedure: LEFT TOTAL KNEE ARTHROPLASTY;  Surgeon: Rod Can, MD;  Location: [facility];  Service: Orthopedics;  Laterality: Left;    There were no vitals filed for this visit.  Visit Diagnosis:  Muscle weakness (generalized) - Plan: PT plan of care  cert/re-cert  Difficulty in walking - Plan: PT plan of care cert/re-cert  Pain in left knee - Plan: PT plan of care cert/re-cert      Subjective Assessment - 10/27/14 1252    Subjective Patient reports she had surgery on left knee total knee replacement 10/03/2014 and had home health physical therapy and is now referred to out patient physical therapy. She reports pain in both knees that limits ability to walk wihtout difficulty. She is still ambulating with a walker as she has done for a long time and is not ready to give up the stability due to right ankle arthritic deformity and instability.    Limitations Walking;Standing   Patient Stated Goals to walk better with less pain in left knee wiith least restrictive assistive device   Currently in Pain? Yes   Pain Score 5    Pain Location Knee   Pain Orientation Left   Pain Descriptors / Indicators Aching;Tightness   Pain Type Acute pain   Pain Onset 1 to 4 weeks ago   Pain Relieving Factors standing and walking   Effect of Pain on Daily Activities cannot perform all of her activities yet due to healing from recent surgery   Multiple Pain Sites No            [facility] PT Assessment - 10/27/14 1306    Assessment  Medical Diagnosis presence of left artificial knee joint   Onset Date/Surgical Date 10/03/14   Next MD Visit 11/20/2014   Precautions   Precautions None   Restrictions   Weight Bearing Restrictions No   Prior Function   Level of Independence Independent   ROM / Strength   AROM / PROM / Strength AROM left knee on arrival 0-105 degrees flexion, ambulating with walker with slow cadence and decreased weight shift to left and right LE. Strength decreased left knee flexion and extension which limits ability to negotiate stairs and curbs and community ambulation.              PT Education - 2014-11-17 1904    Education provided Yes   Education Details Instructed patient in HEP for improving left knee flexion/extension and  strength: roll ball under foot, knee AROM flexion/extension and hip adduction with ball and abduction with resistive band   Person(s) Educated Patient   Methods Explanation;Demonstration;Verbal cues   Comprehension Verbalized understanding;Returned demonstration;Verbal cues required             PT Long Term Goals - 17-Nov-2014 1340    PT LONG TERM GOAL #1   Title decrease pain in right knee to 1-2/10 with daily activities for personal care and walking by 12/08/2014   Baseline Pain level ranges up to 5/10 with weight bearing and ROM exercises   Status New   PT LONG TERM GOAL #2   Title Patient will improve Lower Extremity Functional Scale (LEFS) to > or = 45 points demonstrating reduced self-reported lower extremity disability for daily tasks by 12/08/2014   Baseline LEFS initial score 34/64   Status New   PT LONG TERM GOAL #3   Title Patient will be independent with home program without verbal cuing for strengthening and flexibility exercise for self-management by 12/08/2014   Baseline limited knowledge of appropriate rest/exercises to achieve full ROM and strength   Status New   PT LONG TERM GOAL #5   Title improve 10 MWT to 12 sec. to demonstrate improved community ambulation by 12/08/2014   Baseline 10MW test 16 seconds using walker   Status New               Plan - 2014/11/17 1907    Clinical Impression Statement Patient presents in therapy s/p left knee surgery 10/03/2014 with limited AROM knee flexion, progressing with strength and continues with pain in left knee that limits abiltiy to walk and move as she would like. She has LEFS score 34/64 indicating 50% self perceived impairment and AROM to 105 degrees flextion. She also has pain in right knee s/p recent surgery for TKA withing th past 6 months. She will benefit from physical therapy intervention to address deficits and improve ROM and Strength to allow improved community ambulaiton and activity around the home.    Pt will  benefit from skilled therapeutic intervention in order to improve on the following deficits Abnormal gait;Decreased activity tolerance;Pain;Decreased balance;Difficulty walking   Rehab Potential Good   PT Frequency 3x / week   PT Duration 6 weeks   PT Treatment/Interventions Manual techniques;Therapeutic exercise;Electrical Stimulation;Cryotherapy;Patient/family education;Scar mobilization   PT Next Visit Plan manual therapy soft tissue mobilization, ROM and strengthening exercises and balance activities as indicated   Consulted and Agree with Plan of Care Patient          G-Codes - 11/17/14 1340    Functional Assessment Tool Used  10MW, LEFS, clinical impression, pain scale   Functional Limitation  Mobility: Walking and moving around   Mobility: Walking and Moving Around Current Status [phone]) At least 40 percent but less than 60 percent impaired, limited or restricted   Mobility: Walking and Moving Around Goal Status [phone]) At least 20 percent but less than 40 percent impaired, limited or restricted       Problem List Patient Active Problem List   Diagnosis Date Noted  . Primary osteoarthritis of left knee 10/03/2014  . Acute cystitis with hematuria 09/09/2014  . Primary osteoarthritis of right knee 08/08/2014  . Elevated blood-pressure reading without diagnosis of hypertension 07/31/2014  . Osteoarthritis of right knee, Severe 10/28/2013  . COPD exacerbation 10/22/2013  . Glenohumeral arthritis, Severe 08/20/2013  . Fracture of superior pubic ramus 04/23/2013  . 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 Solitary pulmonary nodule 02/19/2013  . Chronic knee pain 01/15/2013  . Obstructive sleep apnea 10/11/2012  . Umbilical hernia 15/17/6160  . Urinary incontinence, urge 01/17/2012  . Diastolic dysfunction 73/71/0626  . Preoperative cardiovascular examination 04/21/2011  . DDD (degenerative disc disease) 10/26/2010  . RESTLESS LEG SYNDROME 02/23/2010  . HIP REPLACEMENT, LEFT, HX OF  12/21/2009  . FOOT PAIN, BILATERAL 03/25/2009  . HYPERCHOLESTEROLEMIA 06/18/2008  . ALLERGIC RHINITIS 05/21/2008  . ANKLE PAIN, BILATERAL 05/21/2008  . DIVERTICULOSIS, COLON 11/26/2007  . HERPES SIMPLEX INFECTION, RECURRENT 10/17/2007    Jomarie Longs PT 10/28/2014, 3:42 PM  [facility] [address] [place], [address] Phone: [phone]   Fax:  [phone]

## 2014-10-29 ENCOUNTER — Ambulatory Visit: Payer: PPO | Admitting: Physical Therapy

## 2014-11-03 ENCOUNTER — Encounter: Payer: PPO | Admitting: Physical Therapy

## 2014-11-05 ENCOUNTER — Ambulatory Visit: Payer: PPO | Admitting: Physical Therapy

## 2014-11-05 ENCOUNTER — Encounter: Payer: Self-pay | Admitting: Physical Therapy

## 2014-11-05 ENCOUNTER — Other Ambulatory Visit: Payer: Self-pay | Admitting: Family Medicine

## 2014-11-05 DIAGNOSIS — M6281 Muscle weakness (generalized): Secondary | ICD-10-CM

## 2014-11-05 DIAGNOSIS — M25562 Pain in left knee: Secondary | ICD-10-CM

## 2014-11-05 DIAGNOSIS — R262 Difficulty in walking, not elsewhere classified: Secondary | ICD-10-CM

## 2014-11-05 NOTE — Therapy (Signed)
[facility] [address], [place], [address] Phone: [phone]   Fax:  [phone]  Physical Therapy Treatment  Patient Details  Name: Susan [name] MRN: [id number] Date of Birth: [birth year] Referring Provider:  Rod Can, MD  Encounter Date: 11/05/2014      PT End of Session - 11/05/14 1156    Visit Number 2   Number of Visits 12   Date for PT Re-Evaluation 12/08/14   Authorization Type 2   Authorization Time Period 10   PT Start Time 1123   PT Stop Time 1155   PT Time Calculation (min) 32 min   Activity Tolerance Patient tolerated treatment well   Behavior During Therapy [facility] for tasks assessed/performed      Past Medical History  Diagnosis Date  . Diverticulosis of colon (without mention of hemorrhage)   . 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 Acute peptic ulcer, unspecified site, with hemorrhage and perforation, without mention of obstruction   . Chronic airway obstruction, not elsewhere classified   . Ventral hernia   . Hematochezia   . Arthritis     Past Surgical History  Procedure Laterality Date  . Bunionectomy Bilateral   . Total hip arthroplasty  2007    left  . Appendectomy    . Tubal ligation    . Cosmetic surgery  1970's    abdomen. Tummy Tuck  . Foot surgery Bilateral dec 2015 right     left 3 yrs ago  . Total hip arthroplasty      bilateral   . 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 Total knee arthroplasty Left 10/03/2014    Procedure: LEFT TOTAL KNEE ARTHROPLASTY;  Surgeon: Rod Can, MD;  Location: [facility];  Service: Orthopedics;  Laterality: Left;    There were no vitals filed for this visit.  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 She was able to walk a couple of steps without walker and is going to work on this with a cane.    Limitations Walking;Standing   Patient Stated Goals to walk better with less pain in left knee wiith least restrictive assistive device   Currently in Pain? Yes   Pain Score 3    Pain Location Knee   Pain Orientation Left   Pain Descriptors / Indicators Aching;Tightness   Pain Type Acute pain   Pain Onset 1 to 4 weeks ago   Multiple Pain Sites No       Objective: ambulating into clinic using walker for support, 10MW: 14 seconds (improved from 16 seconds initially) AAROM: left knee 0-122 degrees (right knee 0-130 degrees) Strength: left LE decreased hip abduction, extension and knee extension       [facility] Adult PT Treatment/Exercise - 11/05/14 0001    Exercises   Exercises Other Exercises   Other Exercises  supine hip and knee flexion/extensin with manual resistance at end ranges 3 x 5 reps each, seated ball under foot with 2# weight on ankle left LE x 1  min. knee extension x 15, knee flexion with resistive band 2 x 15, stabilization in sitting:  clam 2 x 15 reps, ball betweeen kees hip adduction with glute sets x 15 reps, sit to stand off high table 25" 2 x 5 reps with ball between knees and without assistance of UE's      Patient response to treatment: demonstrated improved strength/control left LE hip and knee with repetition and guidance/verbal cuing of therapist, improved transfers off treatment table with less assistance of UE's as compared to previous session          PT Education - 11/05/14 1156    Education provided Yes   Education Details HEP review   Person(s) Educated Patient   Methods Explanation;Demonstration;Verbal cues   Comprehension Verbalized  understanding;Verbal cues required;Returned demonstration             PT Long Term Goals - 10/27/14 1340    PT LONG TERM GOAL #1   Title decrease pain in right knee to 1-2/10 with daily activities for personal care and walking by 12/08/2014   Baseline Pain level ranges up to 5/10 with weight bearing and ROM exercises   Status New   PT LONG TERM GOAL #2   Title Patient will improve Lower Extremity Functional Scale (LEFS) to > or = 45 points demonstrating reduced self-reported lower extremity disability for daily tasks by 12/08/2014   Baseline LEFS initial score 34/64   Status New   PT LONG TERM GOAL #3   Title Patient will be independent with home program without verbal cuing for strengthening and flexibility exercise for self-management by 12/08/2014   Baseline limited knowledge of appropriate rest/exercises to achieve full ROM and strength   Status New   PT LONG TERM GOAL #5   Title improve 10 MWT to 12 sec. to demonstrate improved community ambulation by 12/08/2014   Baseline 10MW test 16 seconds using walker   Status New               Plan - 11/05/14 1158    Clinical Impression Statement progressing weill with increased ROM from 105 degrees to 122 degrees in a week. still has increased warmth and swelling in left knee and decresaed strength in left LE and will benefit from additional physical therpay intervention to improve strength for walking without walker as able.    Pt will benefit from skilled therapeutic intervention in order to improve on the following deficits Abnormal gait;Decreased activity tolerance;Pain;Decreased balance;Difficulty walking   Rehab Potential Good   PT Frequency 3x / week   PT Duration 6 weeks   PT Treatment/Interventions Manual techniques;Therapeutic exercise;Electrical Stimulation;Cryotherapy;Patient/family education;Scar mobilization   PT Next Visit Plan manual therapy soft tissue mobilization, ROM and strengthening exercises and balance  activities as indicated        Problem List Patient Active Problem List   Diagnosis Date Noted  . Primary osteoarthritis of left knee 10/03/2014  . Acute cystitis with hematuria 09/09/2014  . Primary osteoarthritis of right knee 08/08/2014  . Elevated blood-pressure reading without diagnosis of hypertension 07/31/2014  . Osteoarthritis of right knee, Severe 10/28/2013  . COPD exacerbation 10/22/2013  . Glenohumeral arthritis, Severe 08/20/2013  . Fracture of superior pubic ramus 04/23/2013  . 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 Solitary pulmonary nodule 02/19/2013  . Chronic knee pain 01/15/2013  . Obstructive sleep apnea 10/11/2012  . Umbilical hernia 40/12/6759  . Urinary incontinence, urge 01/17/2012  . Diastolic dysfunction 95/01/3266  . Preoperative cardiovascular examination 04/21/2011  .  DDD (degenerative disc disease) 10/26/2010  . RESTLESS LEG SYNDROME 02/23/2010  . HIP REPLACEMENT, LEFT, HX OF 12/21/2009  . FOOT PAIN, BILATERAL 03/25/2009  . HYPERCHOLESTEROLEMIA 06/18/2008  . ALLERGIC RHINITIS 05/21/2008  . ANKLE PAIN, BILATERAL 05/21/2008  . DIVERTICULOSIS, COLON 11/26/2007  . HERPES SIMPLEX INFECTION, RECURRENT 10/17/2007    Jomarie Longs PT 11/05/2014, 11:14 PM  [facility] [address] [place], [address] Phone: [phone]   Fax:  [phone]

## 2014-11-05 NOTE — Telephone Encounter (Signed)
Last office visit 09/19/2014.  Last refilled 09/06/2014 for #90 with no refills.  Ok to refill?

## 2014-11-06 NOTE — Addendum Note (Signed)
Addended by: Despina Hidden on: 11/06/2014 10:29 AM   Modules accepted: Orders, Medications

## 2014-11-06 NOTE — Telephone Encounter (Signed)
rx called into pharmacy

## 2014-11-07 ENCOUNTER — Encounter: Payer: Self-pay | Admitting: Physical Therapy

## 2014-11-07 ENCOUNTER — Ambulatory Visit: Payer: PPO | Admitting: Physical Therapy

## 2014-11-07 DIAGNOSIS — M25562 Pain in left knee: Secondary | ICD-10-CM

## 2014-11-07 DIAGNOSIS — M6281 Muscle weakness (generalized): Secondary | ICD-10-CM | POA: Diagnosis not present

## 2014-11-07 DIAGNOSIS — R262 Difficulty in walking, not elsewhere classified: Secondary | ICD-10-CM

## 2014-11-07 NOTE — Therapy (Signed)
[facility] [address] [place], [address] Phone: [phone]   Fax:  [phone]  Physical Therapy Treatment  Patient Details  Name: Susan [name] MRN: [id number] Date of Birth: 04-Aug-1940 Referring Provider:  Rod Can, MD  Encounter Date: 11/07/2014      PT End of Session - 11/07/14 0933    Visit Number 3   Number of Visits 12   Date for PT Re-Evaluation 12/08/14   Authorization Type 3   Authorization Time Period 10   PT Start Time 0905   PT Stop Time 0930   PT Time Calculation (min) 25 min   Activity Tolerance Patient tolerated treatment well   Behavior During Therapy [facility] for tasks assessed/performed      Past Medical History  Diagnosis Date  . Diverticulosis of colon (without mention of hemorrhage)   . 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 Acute peptic ulcer, unspecified site, with hemorrhage and perforation, without mention of obstruction   . Chronic airway obstruction, not elsewhere classified   . Ventral hernia   . Hematochezia   . Arthritis     Past Surgical History  Procedure Laterality Date  . Bunionectomy Bilateral   . Total hip arthroplasty  2007    left  . Appendectomy    . Tubal ligation    . Cosmetic surgery  1970's    abdomen. Tummy Tuck  . Foot surgery Bilateral dec 2015 right     left 3 yrs ago  . Total hip arthroplasty      bilateral   . 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 Total knee arthroplasty Left 10/03/2014    Procedure: LEFT TOTAL KNEE ARTHROPLASTY;  Surgeon: Rod Can, MD;  Location: [facility];  Service: Orthopedics;  Laterality: Left;    There were no vitals filed for this visit.  Visit Diagnosis:  Muscle weakness (generalized)  Difficulty in walking  Pain in left knee      Subjective Assessment - 11/07/14 0906    Subjective Patient reports she is walking with cane more indoors and still using walker in the community. She is currently working part time and has decreased endurance and feels weak in LE's.    Currently in Pain? No/denies           [facility] Adult PT Treatment/Exercise - 11/07/14 0907    Exercises   Exercises Other Exercises   Other Exercises  supine hip and knee flexion/extensin with manual resistance at end ranges 3 x 5 reps each, seated hip adduction with glute sets x 10 reps with 3 second holds,  knee extension 2 x 15 with 2# weights on ankles, knee fleixon green resistive band 2 x 15 reps, hip abduciton stabilizaiton  clam 2 x 15 reps, sit to stand off high table 25" 2 x 10 reps without assistance of UE's with verbal cuing to perform with decreased forward flexion over knees     Patient response to treatment: Patient demonstrated improved transfers sit to stand with decreased UE support and with improved quad and gluteal recruitment, patient required verbal cuing an demonstration to perform most exercises with good technique           PT Education - 11/07/14 0933    Education provided Yes   Education Details sit to stand using  correct posture with less leaning forward over knees   Person(s) Educated Patient   Methods Explanation;Demonstration;Verbal cues   Comprehension Verbalized understanding;Returned demonstration;Verbal cues required             PT Long Term Goals - 10/27/14 1340    PT LONG TERM GOAL #1   Title decrease pain in right knee to 1-2/10 with daily activities for personal care and walking by 12/08/2014   Baseline Pain level ranges up to 5/10 with weight bearing and ROM exercises   Status New   PT LONG TERM GOAL #2   Title Patient will improve Lower Extremity Functional Scale (LEFS) to > or = 45 points demonstrating reduced self-reported lower extremity disability for daily tasks by 12/08/2014   Baseline LEFS  initial score 34/64   Status New   PT LONG TERM GOAL #3   Title Patient will be independent with home program without verbal cuing for strengthening and flexibility exercise for self-management by 12/08/2014   Baseline limited knowledge of appropriate rest/exercises to achieve full ROM and strength   Status New   PT LONG TERM GOAL #5   Title improve 10 MWT to 12 sec. to demonstrate improved community ambulation by 12/08/2014   Baseline 10MW test 16 seconds using walker   Status New               Plan - 11/07/14 0930    Clinical Impression Statement Patient is progressing well with increased ROM and strength in LE's. She requires assistance and guidance for exercise progression and will continue to benefit from physical therapy intervention to achieve goals.    Pt will benefit from skilled therapeutic intervention in order to improve on the following deficits Abnormal gait;Decreased activity tolerance;Pain;Decreased balance;Difficulty walking   Rehab Potential Good   PT Frequency 3x / week   PT Duration 6 weeks   PT Treatment/Interventions Manual techniques;Therapeutic exercise;Electrical Stimulation;Cryotherapy;Patient/family education;Scar mobilization   PT Next Visit Plan manual therapy soft tissue mobilization, ROM and strengthening exercises and balance activities as indicated        Problem List Patient Active Problem List   Diagnosis Date Noted  . Primary osteoarthritis of left knee 10/03/2014  . Acute cystitis with hematuria 09/09/2014  . Primary osteoarthritis of right knee 08/08/2014  . Elevated blood-pressure reading without diagnosis of hypertension 07/31/2014  . Osteoarthritis of right knee, Severe 10/28/2013  . COPD exacerbation 10/22/2013  . Glenohumeral arthritis, Severe 08/20/2013  . Fracture of superior pubic ramus 04/23/2013  . 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 Solitary pulmonary nodule 02/19/2013  . Chronic knee pain 01/15/2013  . Obstructive sleep apnea  10/11/2012  . Umbilical hernia 37/02/6268  . Urinary incontinence, urge 01/17/2012  . Diastolic dysfunction 48/54/6270  . Preoperative cardiovascular examination 04/21/2011  . DDD (degenerative disc disease) 10/26/2010  . RESTLESS LEG SYNDROME 02/23/2010  . HIP REPLACEMENT, LEFT, HX OF 12/21/2009  . FOOT PAIN, BILATERAL 03/25/2009  . HYPERCHOLESTEROLEMIA 06/18/2008  . ALLERGIC RHINITIS 05/21/2008  . ANKLE PAIN, BILATERAL 05/21/2008  . DIVERTICULOSIS, COLON 11/26/2007  . HERPES SIMPLEX INFECTION, RECURRENT 10/17/2007    Jomarie Longs PT 11/07/2014, 6:43 PM  [facility] [address] [place], [address] Phone: [phone]   Fax:  [phone]

## 2014-11-10 ENCOUNTER — Ambulatory Visit: Payer: PPO | Admitting: Physical Therapy

## 2014-11-12 ENCOUNTER — Ambulatory Visit: Payer: PPO | Admitting: Physical Therapy

## 2014-11-13 ENCOUNTER — Other Ambulatory Visit: Payer: Self-pay | Admitting: Family Medicine

## 2014-11-13 NOTE — Telephone Encounter (Signed)
Electronic refill request, last refilled 10/20/14 #45 with 0 refills

## 2014-11-14 ENCOUNTER — Ambulatory Visit: Payer: PPO | Admitting: Physical Therapy

## 2014-11-17 ENCOUNTER — Ambulatory Visit: Payer: PPO | Admitting: Physical Therapy

## 2014-11-18 ENCOUNTER — Telehealth: Payer: Self-pay | Admitting: Family Medicine

## 2014-11-18 DIAGNOSIS — E7800 Pure hypercholesterolemia, unspecified: Secondary | ICD-10-CM

## 2014-11-18 NOTE — Telephone Encounter (Signed)
-----   Message from Ellamae Sia sent at 11/11/2014 11:15 AM EDT ----- Regarding: Lab orders for Wednesday,6.29.16 Patient is scheduled for CPX labs, please order future labs, Thanks , Karna Christmas

## 2014-11-19 ENCOUNTER — Other Ambulatory Visit (INDEPENDENT_AMBULATORY_CARE_PROVIDER_SITE_OTHER): Payer: PPO

## 2014-11-19 ENCOUNTER — Encounter: Payer: PPO | Admitting: Physical Therapy

## 2014-11-19 DIAGNOSIS — E7800 Pure hypercholesterolemia, unspecified: Secondary | ICD-10-CM

## 2014-11-19 LAB — COMPREHENSIVE METABOLIC PANEL
ALBUMIN: 3.9 g/dL (ref 3.5–5.2)
ALT: 13 U/L (ref 0–35)
AST: 15 U/L (ref 0–37)
Alkaline Phosphatase: 89 U/L (ref 39–117)
BUN: 15 mg/dL (ref 6–23)
CALCIUM: 8.9 mg/dL (ref 8.4–10.5)
CHLORIDE: 106 meq/L (ref 96–112)
CO2: 26 meq/L (ref 19–32)
Creatinine, Ser: 0.7 mg/dL (ref 0.40–1.20)
GFR: 87.16 mL/min (ref >60.00)
GLUCOSE: 102 mg/dL — AB (ref 70–99)
POTASSIUM: 3.8 meq/L (ref 3.5–5.1)
Sodium: 139 mEq/L (ref 135–145)
Total Bilirubin: 0.5 mg/dL (ref 0.2–1.2)
Total Protein: 6.9 g/dL (ref 6.0–8.3)

## 2014-11-19 LAB — LIPID PANEL
CHOLESTEROL: 164 mg/dL (ref 0–200)
HDL: 45.2 mg/dL (ref >39.00)
LDL Cholesterol: 95 mg/dL (ref 0–99)
NonHDL: 118.8
TRIGLYCERIDES: 120 mg/dL (ref 0.0–149.0)
Total CHOL/HDL Ratio: 4
VLDL: 24 mg/dL (ref 0.0–40.0)

## 2014-11-25 ENCOUNTER — Encounter: Payer: PPO | Admitting: Family Medicine

## 2014-11-26 ENCOUNTER — Ambulatory Visit: Payer: PPO | Admitting: Physical Therapy

## 2014-11-26 ENCOUNTER — Encounter: Payer: Self-pay | Admitting: Family Medicine

## 2014-11-26 ENCOUNTER — Ambulatory Visit (INDEPENDENT_AMBULATORY_CARE_PROVIDER_SITE_OTHER): Payer: PPO | Admitting: Family Medicine

## 2014-11-26 VITALS — BP 118/80 | HR 82 | Temp 97.4°F | Ht 60.0 in | Wt 176.0 lb

## 2014-11-26 DIAGNOSIS — M19011 Primary osteoarthritis, right shoulder: Secondary | ICD-10-CM

## 2014-11-26 MED ORDER — TRAMADOL HCL 50 MG PO TABS: ORAL_TABLET | ORAL | Status: DC

## 2014-11-26 MED ORDER — ALBUTEROL SULFATE HFA 108 (90 BASE) MCG/ACT IN AERS: 2.0000 | INHALATION_SPRAY | Freq: Four times a day (QID) | RESPIRATORY_TRACT | Status: DC | PRN

## 2014-11-26 MED ORDER — METHYLPREDNISOLONE ACETATE 40 MG/ML IJ SUSP
80.0000 mg | Freq: Once | INTRAMUSCULAR | Status: AC
  Administered 2014-11-26: 80 mg via INTRA_ARTICULAR

## 2014-11-26 NOTE — Progress Notes (Signed)
Pre visit review using our clinic review tool, if applicable. No additional management support is needed unless otherwise documented below in the visit note. 

## 2014-11-26 NOTE — Progress Notes (Signed)
Dr. Frederico Hamman T. Dalina Samara, MD, [facility] [place], [address] Phone: [phone] Fax: [phone]  11/26/2014  Patient: Susan [name], MRN: [id number], DOB: 1940-09-23, 74 y.o.  Primary Physician:  Eliezer Lofts, MD  Chief Complaint: Shoulder Pain  Subjective:   Susan [name] is a 74 y.o. 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  Shoulder has been bothering her for years and she has known severe glenohumeral arthritis. She has responded recently relatively well to intra-articular steroid injections.  S/p B knee replacements.   Past Medical History, Surgical History, Social History, Family History, Problem List, Medications, and Allergies have been reviewed and updated if relevant.  Patient Active Problem List   Diagnosis Date Noted  . Primary osteoarthritis of left knee 10/03/2014  . Acute cystitis with hematuria 09/09/2014  . Primary osteoarthritis of right knee 08/08/2014  . Elevated blood-pressure reading without diagnosis of hypertension 07/31/2014  . Osteoarthritis of right knee, Severe 10/28/2013  . COPD exacerbation 10/22/2013  . Glenohumeral arthritis, Severe 08/20/2013  . Fracture of superior pubic ramus 04/23/2013  . 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 Solitary pulmonary nodule 02/19/2013  . Chronic knee pain 01/15/2013  . Obstructive sleep apnea 10/11/2012  . Umbilical hernia 70/05/7492  . Urinary incontinence, urge 01/17/2012  . Diastolic dysfunction 49/67/5916  . Preoperative cardiovascular examination 04/21/2011  . DDD (degenerative disc disease) 10/26/2010  . RESTLESS LEG SYNDROME 02/23/2010  . HIP REPLACEMENT, LEFT, HX OF 12/21/2009  . FOOT PAIN, BILATERAL 03/25/2009  . HYPERCHOLESTEROLEMIA 06/18/2008  . ALLERGIC  RHINITIS 05/21/2008  . ANKLE PAIN, BILATERAL 05/21/2008  . DIVERTICULOSIS, COLON 11/26/2007  . HERPES SIMPLEX INFECTION, RECURRENT 10/17/2007    Past Medical History  Diagnosis Date  . Diverticulosis of colon (without mention of hemorrhage)   . 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 Acute peptic ulcer, unspecified site, with hemorrhage and perforation, without mention of obstruction   . Chronic airway obstruction, not elsewhere classified   . Ventral hernia   . Hematochezia   . Arthritis     Past Surgical History  Procedure Laterality Date  . Bunionectomy Bilateral   . Total hip arthroplasty  2007    left  . Appendectomy    . Tubal ligation    . Cosmetic surgery  1970's    abdomen. Tummy Tuck  . Foot surgery Bilateral dec 2015 right     left 3 yrs ago  . Total hip arthroplasty      bilateral   . 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 Total knee arthroplasty Left 10/03/2014    Procedure: LEFT TOTAL KNEE ARTHROPLASTY;  Surgeon: Rod Can, MD;  Location: [facility];  Service: Orthopedics;  Laterality: Left;    History   Social History  . Marital Status: Married    Spouse Name: N/A  . Number of Children: 3  . Years of Education: N/A   Occupational History  . self employeed     Owns beauty shop   Social History Main Topics  . Smoking status: Former Smoker -- 0.50 packs/day for 40 years    Types: Cigarettes  Quit date: 08/29/2000  . Smokeless tobacco: Never Used  . Alcohol Use: Yes     Comment: rare  . Drug Use: No  . Sexual Activity: Not on file   Other Topics Concern  . Not on file   Social History Narrative   Diet: Moderate, low fat, skips meals, some fruit and veggies   Regular exercise:  No   HCPOA is [name] and [name],  Has living will, Not sure of code status (reviewed  2013)    Family History  Problem Relation Age of Onset  . Diabetes Mother   . Coronary artery disease Mother   . Lung disease Mother   . Heart disease Mother     CAD  . Coronary artery disease Father   . Kidney disease Father   . Diabetes Father   . Heart disease Father     CAD  . Cancer Neg Hx     No Known Allergies  Medication list reviewed and updated in full in [facility].  GEN: No fevers, chills. Nontoxic. Primarily MSK c/o today. MSK: Detailed in the HPI GI: tolerating PO intake without difficulty Neuro: No numbness, parasthesias, or tingling associated. Otherwise the pertinent positives of the ROS are noted above.   Objective:   BP 118/80 mmHg  Pulse 82  Temp(Src) 97.4 F (36.3 C) (Oral)  Ht 5' (1.524 m)  Wt 176 lb (79.833 kg)  BMI 34.37 kg/m2   GEN: WDWN, NAD, Non-toxic, Alert & Oriented x 3 HEENT: Atraumatic, Normocephalic.  Ears and Nose: No external deformity. EXTR: No clubbing/cyanosis/edema PSYCH: Normally interactive. Conversant. Not depressed or anxious appearing.  Calm demeanor.     lacks 10 of terminal abduction and flexion. Nontender along clavicle. Minimally tender at the before meals joint. Nontender at the humerus.   Strength testing in all directions is 5/5. Grip strength is 5/5.    Hawkins test and Neer testing is notably tender and there is grinding with movement and rotation of the humeral head.  Radiology: No results found.  Assessment and Plan:   Glenohumeral arthritis, right - Plan: methylPREDNISolone acetate (DEPO-MEDROL) injection 80 mg   for arthritis care and symptom management, intra-articular injection is reasonable.  Intrarticular Shoulder Injection, RIGHT Verbal consent was obtained from the patient. Risks including infection explained and contrasted with benefits and alternatives. Patient prepped with Chloraprep and Ethyl Chloride used for anesthesia. An intraarticular shoulder injection was performed using the  posterior approach. The patient tolerated the procedure well and had decreased pain post injection. No complications. Injection: 8 cc of Lidocaine 1% and Depo-Medrol 80 mg. Needle: 22 gauge   Follow-up: prn  New Prescriptions   ALBUTEROL (PROAIR HFA) 108 (90 BASE) MCG/ACT INHALER    Inhale 2 puffs into the lungs every 6 (six) hours as needed for wheezing or shortness of breath.   No orders of the defined types were placed in this encounter.    Signed,  Maud Deed. Kymoni Monday, MD   Patient's Medications  New Prescriptions   ALBUTEROL (PROAIR HFA) 108 (90 BASE) MCG/ACT INHALER    Inhale 2 puffs into the lungs every 6 (six) hours as needed for wheezing or shortness of breath.  Previous Medications   ACYCLOVIR (ZOVIRAX) 400 MG TABLET    TAKE ONE TABLET TWICE A DAY   ASPIRIN EC 325 MG TABLET    Take 1 tablet (325 mg total) by mouth 2 (two) times daily after a meal.   BACLOFEN (LIORESAL) 10 MG TABLET  Take 10 mg by mouth at bedtime as needed for muscle spasms.   CHOLECALCIFEROL (VITAMIN D3) 5000 UNITS CAPS    Take 1 capsule by mouth daily.   DIPHENHYDRAMINE (BENADRYL) 25 MG CAPSULE    Take 25 mg by mouth at bedtime as needed.   DOCUSATE SODIUM (COLACE) 100 MG CAPSULE    Take 1 capsule (100 mg total) by mouth 2 (two) times daily.   MELOXICAM (MOBIC) 15 MG TABLET    Take 1 tablet (15 mg total) by mouth daily.   MILK THISTLE PO    Take 1 tablet by mouth daily.   MULTIPLE VITAMIN (MULTIVITAMIN WITH MINERALS) TABS TABLET    Take 1 tablet by mouth daily.   PROBIOTIC PRODUCT (PROBIOTIC PO)    Take 1 tablet by mouth daily.   ROPINIROLE (REQUIP) 3 MG TABLET    TAKE ONE TABLET AT BEDTIME   TIOTROPIUM (SPIRIVA) 18 MCG INHALATION CAPSULE    Place 1 capsule (18 mcg total) into inhaler and inhale daily.   TRAZODONE (DESYREL) 50 MG TABLET    TAKE ONE AND ONE-HALF TABLETS AT BEDTIME. AS NEEDED FOR SLEEP   TURMERIC PO    Take 1 tablet by mouth daily.   VITAMIN A 8000 UNIT CAPSULE    Take 8,000 Units by  mouth daily.   ZINC GLUCONATE 50 MG TABLET    Take 50 mg by mouth daily.  Modified Medications   Modified Medication Previous Medication   TRAMADOL (ULTRAM) 50 MG TABLET traMADol (ULTRAM) 50 MG tablet      TAKE ONE TO TWO TABLETS EVERY SIX HOURS AS NEEDED FOR PAIN    TAKE ONE TO TWO TABLETS EVERY SIX HOURS AS NEEDED FOR PAIN  Discontinued Medications   No medications on file

## 2014-11-28 ENCOUNTER — Ambulatory Visit: Payer: PPO | Admitting: Physical Therapy

## 2014-12-01 ENCOUNTER — Encounter: Payer: PPO | Admitting: Physical Therapy

## 2014-12-04 ENCOUNTER — Encounter: Payer: PPO | Admitting: Physical Therapy

## 2014-12-08 ENCOUNTER — Other Ambulatory Visit: Payer: Self-pay | Admitting: Family Medicine

## 2014-12-08 ENCOUNTER — Ambulatory Visit: Payer: PPO | Admitting: Pulmonary Disease

## 2014-12-08 NOTE — Telephone Encounter (Signed)
Last office visit 11/26/2014 with Dr. Lorelei Pont.  Last refilled 11/14/2014 for #45 with no refills.  Ok to refill?

## 2014-12-09 ENCOUNTER — Encounter: Payer: Self-pay | Admitting: Family Medicine

## 2014-12-09 ENCOUNTER — Ambulatory Visit (INDEPENDENT_AMBULATORY_CARE_PROVIDER_SITE_OTHER): Payer: PPO | Admitting: Family Medicine

## 2014-12-09 VITALS — BP 134/80 | HR 62 | Temp 98.4°F | Ht 60.0 in | Wt 176.2 lb

## 2014-12-09 DIAGNOSIS — G4700 Insomnia, unspecified: Secondary | ICD-10-CM

## 2014-12-09 DIAGNOSIS — T485X5A Adverse effect of other anti-common-cold drugs, initial encounter: Secondary | ICD-10-CM | POA: Insufficient documentation

## 2014-12-09 DIAGNOSIS — J310 Chronic rhinitis: Secondary | ICD-10-CM | POA: Diagnosis not present

## 2014-12-09 DIAGNOSIS — R0981 Nasal congestion: Secondary | ICD-10-CM

## 2014-12-09 DIAGNOSIS — T485X1A Poisoning by other anti-common-cold drugs, accidental (unintentional), initial encounter: Secondary | ICD-10-CM

## 2014-12-09 DIAGNOSIS — F5104 Psychophysiologic insomnia: Secondary | ICD-10-CM | POA: Insufficient documentation

## 2014-12-09 MED ORDER — FLUTICASONE PROPIONATE 50 MCG/ACT NA SUSP: 2.0000 | Freq: Every day | NASAL | Status: DC

## 2014-12-09 MED ORDER — PREDNISONE 20 MG PO TABS: ORAL_TABLET | ORAL | Status: DC

## 2014-12-09 NOTE — Progress Notes (Signed)
Pre visit review using our clinic review tool, if applicable. No additional management support is needed unless otherwise documented below in the visit note. 

## 2014-12-09 NOTE — Assessment & Plan Note (Addendum)
Using to decongestant/stimulants at bedtime. Causing issues sleeping . Stop and change to mucinex DM. Conitnue allergy med at night without decongestant.  OVerusing melatonin.. Decrease.

## 2014-12-09 NOTE — Assessment & Plan Note (Signed)
Treat with prednisone taper. Change nasal decongestant to flonase. COnitnue allergy medicaiton.

## 2014-12-09 NOTE — Patient Instructions (Addendum)
Stop OTC nasal spray.  Change to mucinex DM.  Make not taking decongestant at bedtime.  Decrease melatonin to 3-10 mg at bedtime.  Start prednisone taper in morning.  Start nasal flonase 2 sprays per nostril daily.  If sleep not improving follow up.

## 2014-12-09 NOTE — Progress Notes (Signed)
   Subjective:    Patient ID: Susan [name], female    DOB: 25-Mar-1941, 74 y.o.   MRN: [id number]  HPI   74 year old female with history of COPD,  former extensive smoking history, allergic rhinitis presents with nasal congestion. She reports  several weeks of intermittent nasal congestion. She uses using nasal decongestant twice a day.   No sneeze, in itchy, no fever. No facial pain.  No post nasal drip.  No cough, no SOB.    She is on daily spiriva for COPD control. Moderate control. Have follow up with Dr, [place].  She is taking mucinex D twice daily,  Cannot sleep at night using 20-30 mg melatonin at night and allergy med at night to sleep.   Review of Systems  Constitutional: Negative for fever and fatigue.  HENT: Positive for congestion. Negative for drooling, ear pain, postnasal drip and rhinorrhea.   Eyes: Negative for pain.  Respiratory: Negative for chest tightness and shortness of breath.   Cardiovascular: Negative for chest pain, palpitations and leg swelling.  Gastrointestinal: Negative for abdominal pain.  Genitourinary: Negative for dysuria.       Objective:   Physical Exam  Constitutional: Vital signs are normal. She appears well-developed and well-nourished. She is cooperative.  Non-toxic appearance. She does not appear ill. No distress.  HENT:  Head: Normocephalic.  Right Ear: Hearing, tympanic membrane, external ear and ear canal normal. Tympanic membrane is not erythematous, not retracted and not bulging.  Left Ear: Hearing, tympanic membrane, external ear and ear canal normal. Tympanic membrane is not erythematous, not retracted and not bulging.  Nose: Mucosal edema and rhinorrhea present. Right sinus exhibits no maxillary sinus tenderness and no frontal sinus tenderness. Left sinus exhibits no maxillary sinus tenderness and no frontal sinus tenderness.  Mouth/Throat: Uvula is midline, oropharynx is clear and moist and mucous membranes are normal.  Pale  nasal turbinates  Eyes: Conjunctivae, EOM and lids are normal. Pupils are equal, round, and reactive to light. Lids are everted and swept, no foreign bodies found.  Neck: Trachea normal and normal range of motion. Neck supple. Carotid bruit is not present. No thyroid mass and no thyromegaly present.  Cardiovascular: Normal rate, regular rhythm, S1 normal, S2 normal, normal heart sounds, intact distal pulses and normal pulses.  Exam reveals no gallop and no friction rub.   No murmur heard. Pulmonary/Chest: Effort normal and breath sounds normal. No tachypnea. No respiratory distress. She has no decreased breath sounds. She has no wheezes. She has no rhonchi. She has no rales.  Neurological: She is alert.  Skin: Skin is warm, dry and intact. No rash noted.  Psychiatric: Her speech is normal and behavior is normal. Judgment normal. Her mood appears not anxious. Cognition and memory are normal. She does not exhibit a depressed mood.          Assessment & Plan:

## 2014-12-19 ENCOUNTER — Telehealth: Payer: Self-pay

## 2014-12-19 NOTE — Telephone Encounter (Signed)
Pt left v/m requesting rx of ambien sent to [facility]. Pt was [place] the beach and pt took daughter's ambien and pt had good results, no hangover feeling and pt slept well. Pt was seen on 12/09/14 for insomnia.Pt request cb.

## 2014-12-22 MED ORDER — ZOLPIDEM TARTRATE 5 MG PO TABS: 5.0000 mg | ORAL_TABLET | Freq: Every evening | ORAL | Status: DC | PRN

## 2014-12-22 NOTE — Telephone Encounter (Signed)
Given pt age Lorrin Mais higher risk medication. Recommend using in a limited fashion only as needed.  Please call in and let pt know.

## 2014-12-22 NOTE — Telephone Encounter (Signed)
Patient notified as instructed by telephone.  Prescription called into [facility] [address] [place].

## 2014-12-22 NOTE — Telephone Encounter (Signed)
Pt left /vm requesting cb about ambien rx.

## 2014-12-25 ENCOUNTER — Ambulatory Visit: Payer: PPO | Admitting: Pulmonary Disease

## 2014-12-31 ENCOUNTER — Telehealth: Payer: Self-pay | Admitting: Family Medicine

## 2014-12-31 ENCOUNTER — Other Ambulatory Visit: Payer: Self-pay | Admitting: Family Medicine

## 2014-12-31 NOTE — Telephone Encounter (Signed)
Patient Name: Susan [name] DOB: 16-Aug-1940 Initial Comment Caller states, yesterday she got dizzy and non coherent while walking. it lasted for 3-4 min. All symptoms went away. Later her blood pressure and oxygen were fine. Today she is fine, but concerned about yesterday's symptoms. Nurse Assessment Guidelines Guideline Title Affirmed Question Affirmed Notes Final Disposition User FINAL ATTEMPT MADE - no message left Ronnald Ramp, RN, Miranda Comments Due to symptoms yesterday, checked epic to see if there was a secondary number listed. Only other number listed is a work number, did not attempt to call that number.

## 2015-01-01 NOTE — Telephone Encounter (Signed)
Left voicemail requesting pt to call office back 

## 2015-01-01 NOTE — Telephone Encounter (Signed)
Call pt for update.  Sounds concerning for mini stroke.. She should make  an appointment with me this week if able.

## 2015-01-01 NOTE — Telephone Encounter (Signed)
Susan [name] scheduled appt for tomorrow, per pt she still feels okay

## 2015-01-02 ENCOUNTER — Ambulatory Visit (INDEPENDENT_AMBULATORY_CARE_PROVIDER_SITE_OTHER): Payer: PPO | Admitting: Family Medicine

## 2015-01-02 ENCOUNTER — Encounter: Payer: Self-pay | Admitting: Family Medicine

## 2015-01-02 VITALS — BP 124/64 | HR 103 | Temp 98.1°F | Wt 177.5 lb

## 2015-01-02 DIAGNOSIS — R42 Dizziness and giddiness: Secondary | ICD-10-CM

## 2015-01-02 DIAGNOSIS — K429 Umbilical hernia without obstruction or gangrene: Secondary | ICD-10-CM

## 2015-01-02 NOTE — Progress Notes (Signed)
Subjective:    Patient ID: Susan [name], female    DOB: [birth year], 74 y.o.   MRN: [id number]  HPI   74 year old female  wth history OSA, diastolic dysfunction and  COPD  presents with new onset  Neurologic changes.  She reports that she was in her normal state of health until 2 days ago when she was walkign across the street ( in heat) she had sudden onset dizziness, presyncopal feeling, trouble making walker move straight ( felt like she was going to the left).  (no vertigo) Felt confused, couldn't think straight.  No speech, no numbness, no weakness on one side of body. No CP , no SOB.  Went into [place], sat down, rested used albuterol but was not SOB. Improved after a few minutes of rest. No issue since.  BP check at home was 130/70. HR nml. Had eaten, drinks water well.   No HA, no fall, no  Head injury.  Since then she has felt weak just now coming into office but skipped food until pizza30 min before coming. No further confusion. No othher issues  She is not on aspirin.  Slight anemia in 09/2014 no bleeding. She has started Ambien 5 mg in last week.. This helped you sleep a lot. Wakes up feeling rested.     family history includes Coronary artery disease in her father and mother; Diabetes in her father and mother; Heart disease in her father and mother; Kidney disease in her father; Lung disease in her mother. There is no history of Cancer.   Review of Systems     Objective:   Physical Exam  Constitutional: She is oriented to person, place, and time. Vital signs are normal. She appears well-developed and well-nourished. She is cooperative.  Non-toxic appearance. She does not appear ill. No distress.  HENT:  Head: Normocephalic.  Right Ear: Hearing, tympanic membrane, external ear and ear canal normal. Tympanic membrane is not erythematous, not retracted and not bulging.  Left Ear: Hearing, tympanic membrane, external ear and ear canal normal. Tympanic membrane is not  erythematous, not retracted and not bulging.  Nose: No mucosal edema or rhinorrhea. Right sinus exhibits no maxillary sinus tenderness and no frontal sinus tenderness. Left sinus exhibits no maxillary sinus tenderness and no frontal sinus tenderness.  Mouth/Throat: Uvula is midline, oropharynx is clear and moist and mucous membranes are normal.  Eyes: Conjunctivae, EOM and lids are normal. Pupils are equal, round, and reactive to light. Lids are everted and swept, no foreign bodies found.  Neck: Trachea normal and normal range of motion. Neck supple. Carotid bruit is not present. No thyroid mass and no thyromegaly present.  Cardiovascular: Normal rate, regular rhythm, S1 normal, S2 normal, normal heart sounds, intact distal pulses and normal pulses.  Exam reveals no gallop and no friction rub.   No murmur heard. Pulmonary/Chest: Effort normal and breath sounds normal. No tachypnea. No respiratory distress. She has no decreased breath sounds. She has no wheezes. She has no rhonchi. She has no rales.  Abdominal: Soft. Normal appearance and bowel sounds are normal. There is no tenderness.  Large umbilical hernia, non tender, reducible  Neurological: She is alert and oriented to person, place, and time. She has normal strength and normal reflexes. No cranial nerve deficit or sensory deficit. She exhibits normal muscle tone. She displays a negative Romberg sign. Coordination and gait normal. GCS eye subscore is 4. GCS verbal subscore is 5. GCS motor subscore is 6.  Nml  cerebellar exam   No papilledema  Skin: Skin is warm, dry and intact. No rash noted.  Psychiatric: She has a normal mood and affect. Her speech is normal and behavior is normal. Judgment and thought content normal. Her mood appears not anxious. Cognition and memory are normal. Cognition and memory are not impaired. She does not exhibit a depressed mood. She exhibits normal recent memory and normal remote memory.          Assessment &  Plan:

## 2015-01-02 NOTE — Progress Notes (Signed)
Pre visit review using our clinic review tool, if applicable. No additional management support is needed unless otherwise documented below in the visit note. 

## 2015-01-02 NOTE — Addendum Note (Signed)
Addended by: Eliezer Lofts E on: 01/02/2015 03:16 PM   Modules accepted: Level of Service

## 2015-01-02 NOTE — Assessment & Plan Note (Signed)
Symptoms possible due to being overheated, mild dehydration. Pt does not eat regularly.  Also may be due to [place]. Hold for now.  No proceeding symptoms.  Possible TIA, but neuro exam nml now. No further issues..  Start ASA 81 mg daily.  Offered exetensive work up with MRI brain, carotid doppler and ECHO.  Pt refuses at this time, but if further issue we can consider.

## 2015-01-02 NOTE — Patient Instructions (Addendum)
Start baby aspirin 81 mg daily. Drink lots of water, rest, try not to get over heated. Hold [place] for now. Can return to trazodone.  Call if any further neurologic symptoms or dizziness.

## 2015-01-02 NOTE — Assessment & Plan Note (Signed)
Reducible. Pt willl let me kow if more sore.. Will then refer to Gen surgery.

## 2015-01-06 ENCOUNTER — Encounter: Payer: Self-pay | Admitting: Pulmonary Disease

## 2015-01-06 ENCOUNTER — Ambulatory Visit (INDEPENDENT_AMBULATORY_CARE_PROVIDER_SITE_OTHER): Payer: PPO | Admitting: Pulmonary Disease

## 2015-01-06 VITALS — BP 136/80 | HR 89 | Ht 60.0 in | Wt 177.0 lb

## 2015-01-06 DIAGNOSIS — J449 Chronic obstructive pulmonary disease, unspecified: Secondary | ICD-10-CM

## 2015-01-06 DIAGNOSIS — R911 Solitary pulmonary nodule: Secondary | ICD-10-CM | POA: Diagnosis not present

## 2015-01-06 NOTE — Addendum Note (Signed)
Addended by: Len Blalock on: 01/06/2015 05:21 PM   Modules accepted: Orders

## 2015-01-06 NOTE — Progress Notes (Signed)
Subjective:    Patient ID: Susan [name], female    DOB: Feb 26, 1941, 74 y.o.   MRN: [id number]  Synopsis: Ms. Jeangilles first saw the [facility] pulmonary clinic in September 2014 for shortness of breath. She had a previous heavy smoking history but he quit by the time she saw me. Lung function testing was actually not consistent with obstruction but she did have emphysema on her chest CT. Spiriva was started empirically for that. She also had a home sleep study which showed an apnea hypopnea index of 44. A follow-up split-night study showed an AHI of only 18, CPAP was titrated to 10 cm of water but she was only able to wear it for approximately 15 minutes. She has had difficulty using CPAP when prescribed in the past.  HPI  Chief Complaint  Patient presents with  . Acute Visit    pt c/o increased sob with any exertion Xfew weeks.  denies prod cough, chest pain,fever.      Mrs. Blincoe had her knee surgery and she is doing well from an arthritis standpoint. However, she continues to complain of shortness of breath with minimal exertion. She has not been exercising regulate. She is no longer participating in physical therapy. She does not have chest pain, cough, or wheezing. She does not have mucus production. She does not have leg swelling. She says that her heart has been checked out and it is "okay. She continues to take Spiriva daily. She is using albuterol up to 4 times a day.  Past Medical History  Diagnosis Date  . Diverticulosis of colon (without mention of hemorrhage)   . 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 Acute peptic ulcer, unspecified site, with hemorrhage and perforation, without mention of obstruction   . Chronic airway obstruction, not elsewhere classified   . Ventral hernia   . Hematochezia   . Arthritis      Review of Systems  Constitutional: Negative for fever, chills and fatigue.  HENT: Negative for congestion, nosebleeds, postnasal drip and  rhinorrhea.   Respiratory: Negative for cough, shortness of breath and wheezing.   Cardiovascular: Negative for chest pain, palpitations and leg swelling.       Objective:   Physical Exam  Filed Vitals:   01/06/15 1627  BP: 136/80  Pulse: 89  Height: 5' (1.524 m)  Weight: 177 lb (80.287 kg)  SpO2: 96%  RA  Gen: comfortable HEENT: NCAT, EOMi PULM: Few crackles left base CV: RRR, no mgr AB: BS+, soft, nontender Ext: warm, no edema  June 2014 full pulmonary function test at [facility] ratio 80%, FEV1 1.74 L (101% predicted) total lung capacity is 3.81 L (91% predicted), DLCO 11.0 (56% predicted) May 2014 chest x-ray no acute polyp cardiopulmonary abnormality, questionable hyperinflation 01/2013 CT chest 75mm sub pleural nodule, mild emphysema 01/2013 CT chest 76mm sub pleural nodule, mild emphysema 04/22/2013 CT chest > 25mm nodule unchanged 08/2014 CT chest > nodule unchanged, progressive interstitial infilatrate bilateral lower lobes of uncertain etiology     Assessment & Plan:   Solitary pulmonary nodule I have personally reviewed the images from the CT chest which showed that the nodule had not changed over 2 years time. Further imaging needed.  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 However, she continues to complain of shortness of breath. I explained to her today that this is likely a multifactorial process and deconditioning is likely that way. However, we will attempt  an alternative broncho-dilator with Symbicort.  Plan: Trial Symbicort instead of Spiriva Check full pulmonary function test to ensure there is nothing else going on Ambulate as much as possible, consider drawn in pulmonary rehabilitation if no improvement with exercise at home Follow-up 4 months or sooner if needed    Updated Medication List Outpatient Encounter Prescriptions as of 01/06/2015  Medication Sig  . acyclovir (ZOVIRAX)  400 MG tablet TAKE ONE TABLET TWICE A DAY (Patient taking differently: TAKE ONE TABLET TWICE A DAY AS NEEDED)  . albuterol (PROAIR HFA) 108 (90 BASE) MCG/ACT inhaler Inhale 2 puffs into the lungs every 6 (six) hours as needed for wheezing or shortness of breath.  . baclofen (LIORESAL) 10 MG tablet Take 10 mg by mouth at bedtime as needed for muscle spasms.  . Cholecalciferol (VITAMIN D3) 5000 UNITS CAPS Take 1 capsule by mouth daily.  . diphenhydrAMINE (BENADRYL) 25 mg capsule Take 25 mg by mouth at bedtime as needed.  . docusate sodium (COLACE) 100 MG capsule Take 1 capsule (100 mg total) by mouth 2 (two) times daily.  . fluticasone (FLONASE) 50 MCG/ACT nasal spray USE TWO PUFFS INTO EACH NOSTRIL DAILY  . meloxicam (MOBIC) 15 MG tablet Take 1 tablet (15 mg total) by mouth daily.  Marland Kitchen MILK THISTLE PO Take 1 tablet by mouth daily.  . Multiple Vitamin (MULTIVITAMIN WITH MINERALS) TABS tablet Take 1 tablet by mouth daily.  . Probiotic Product (PROBIOTIC PO) Take 1 tablet by mouth daily.  Marland Kitchen rOPINIRole (REQUIP) 3 MG tablet TAKE ONE TABLET AT BEDTIME  . tiotropium (SPIRIVA) 18 MCG inhalation capsule Place 1 capsule (18 mcg total) into inhaler and inhale daily.  . traMADol (ULTRAM) 50 MG tablet TAKE ONE TO TWO TABLETS EVERY SIX HOURS AS NEEDED FOR PAIN  . traZODone (DESYREL) 50 MG tablet TAKE ONE AND ONE-HALF TABLETS AT BEDTIME AS NEEDED FOR SLEEP  . TURMERIC PO Take 1 tablet by mouth daily.  . vitamin A 8000 UNIT capsule Take 8,000 Units by mouth daily.  Marland Kitchen zinc gluconate 50 MG tablet Take 50 mg by mouth daily.  Marland Kitchen zolpidem (AMBIEN) 5 MG tablet Take 1 tablet (5 mg total) by mouth at bedtime as needed for sleep.   Facility-Administered Encounter Medications as of 01/06/2015  Medication  . dexamethasone (DECADRON) injection 4 mg  . ondansetron (ZOFRAN) 4 mg in sodium chloride 0.9 % 50 mL IVPB  . ondansetron (ZOFRAN) 4 mg in sodium chloride 0.9 % 50 mL IVPB

## 2015-01-06 NOTE — Patient Instructions (Signed)
Use Symbicort instead of Spiriva for the next few weeks and let us know if it is better for her shortness of breath We will arrange a lung function testing call you with the results Exercises much as you can We will see you back in 3 months or sooner if needed

## 2015-01-06 NOTE — Assessment & Plan Note (Signed)
I have personally reviewed the images from the CT chest which showed that the nodule had not changed over 2 years time. Further imaging needed.

## 2015-01-06 NOTE — Assessment & Plan Note (Signed)
She has mild centrilobular emphysema but no significant airflow obstruction. However, she continues to complain of shortness of breath. I explained to her today that this is likely a multifactorial process and deconditioning is likely that way. However, we will attempt an alternative broncho-dilator with Symbicort.  Plan: Trial Symbicort instead of Spiriva Check full pulmonary function test to ensure there is nothing else going on Ambulate as much as possible, consider drawn in pulmonary rehabilitation if no improvement with exercise at home Follow-up 4 months or sooner if needed

## 2015-01-16 ENCOUNTER — Other Ambulatory Visit: Payer: Self-pay | Admitting: Family Medicine

## 2015-01-16 NOTE — Telephone Encounter (Signed)
Last office visit 01/02/2015.  Last refilled 12/08/2014 for #45 with no refills.  Ok to refill?

## 2015-01-20 ENCOUNTER — Encounter (HOSPITAL_COMMUNITY): Payer: PPO

## 2015-02-09 ENCOUNTER — Ambulatory Visit: Payer: PPO | Admitting: Family Medicine

## 2015-02-10 ENCOUNTER — Encounter (HOSPITAL_COMMUNITY): Payer: PPO

## 2015-02-10 ENCOUNTER — Ambulatory Visit (INDEPENDENT_AMBULATORY_CARE_PROVIDER_SITE_OTHER): Payer: PPO | Admitting: Family Medicine

## 2015-02-10 ENCOUNTER — Encounter: Payer: Self-pay | Admitting: Family Medicine

## 2015-02-10 VITALS — BP 163/93 | HR 84 | Temp 98.3°F | Ht 60.0 in | Wt 180.5 lb

## 2015-02-10 DIAGNOSIS — Z23 Encounter for immunization: Secondary | ICD-10-CM | POA: Diagnosis not present

## 2015-02-10 DIAGNOSIS — K429 Umbilical hernia without obstruction or gangrene: Secondary | ICD-10-CM | POA: Diagnosis not present

## 2015-02-10 NOTE — Progress Notes (Signed)
   Subjective:    Patient ID: Susan [name], female    DOB: 1941-02-05, 74 y.o.   MRN: [id number]  HPI  74 year old female presents with concerns about an umbilical  hernia. She is requesting a referral to a surgeon to evaluate further.  She reports she first noted this area in past several years.  She has intermittent pain and soreness over site of umbilical hernia and protruding more in last few months.  No redness, area is alwas reducible.  No fever. No abdominal pain.   Social History /Family History/Past Medical History reviewed and updated if needed.   Review of Systems  Constitutional: Negative for fatigue.  HENT: Negative for ear pain.   Eyes: Negative for pain.  Respiratory: Negative for cough and shortness of breath.   Gastrointestinal: Negative for nausea, abdominal pain, diarrhea, constipation and blood in stool.  Genitourinary: Negative for dysuria.       Objective:   Physical Exam  Constitutional: She is oriented to person, place, and time. Vital signs are normal. She appears well-developed and well-nourished. She is cooperative.  Non-toxic appearance. She does not appear ill. No distress.  HENT:  Head: Normocephalic.  Right Ear: Hearing, tympanic membrane, external ear and ear canal normal. Tympanic membrane is not erythematous, not retracted and not bulging.  Left Ear: Hearing, tympanic membrane, external ear and ear canal normal. Tympanic membrane is not erythematous, not retracted and not bulging.  Nose: No mucosal edema or rhinorrhea. Right sinus exhibits no maxillary sinus tenderness and no frontal sinus tenderness. Left sinus exhibits no maxillary sinus tenderness and no frontal sinus tenderness.  Mouth/Throat: Uvula is midline, oropharynx is clear and moist and mucous membranes are normal.  Eyes: Conjunctivae, EOM and lids are normal. Pupils are equal, round, and reactive to light. Lids are everted and swept, no foreign bodies found.  Neck: Trachea normal  and normal range of motion. Neck supple. Carotid bruit is not present. No thyroid mass and no thyromegaly present.  Cardiovascular: Normal rate, regular rhythm, S1 normal, S2 normal, normal heart sounds, intact distal pulses and normal pulses.  Exam reveals no gallop and no friction rub.   No murmur heard. Pulmonary/Chest: Effort normal and breath sounds normal. No tachypnea. No respiratory distress. She has no decreased breath sounds. She has no wheezes. She has no rhonchi. She has no rales.  Abdominal: Soft. Normal appearance and bowel sounds are normal. There is no tenderness.    Large central defect with  umbilical hernia, non tender, reducible  no redness.  Neurological: She is alert and oriented to person, place, and time. She has normal strength and normal reflexes. No cranial nerve deficit or sensory deficit. She exhibits normal muscle tone. She displays a negative Romberg sign. Coordination and gait normal. GCS eye subscore is 4. GCS verbal subscore is 5. GCS motor subscore is 6.  Nml cerebellar exam   No papilledema  Skin: Skin is warm, dry and intact. No rash noted.  Psychiatric: She has a normal mood and affect. Her speech is normal and behavior is normal. Judgment and thought content normal. Her mood appears not anxious. Cognition and memory are normal. Cognition and memory are not impaired. She does not exhibit a depressed mood. She exhibits normal recent memory and normal remote memory.          Assessment & Plan:

## 2015-02-10 NOTE — Addendum Note (Signed)
Addended by: Carter Kitten on: 02/10/2015 01:48 PM   Modules accepted: Orders

## 2015-02-10 NOTE — Progress Notes (Signed)
Pre visit review using our clinic review tool, if applicable. No additional management support is needed unless otherwise documented below in the visit note. 

## 2015-02-10 NOTE — Assessment & Plan Note (Signed)
Given soreness, increasing in size , pt request.. Will refer to general surgery for recommendations on treatment.

## 2015-02-10 NOTE — Patient Instructions (Signed)
Stop at front desk on way out for referral to general surgeon.

## 2015-02-12 ENCOUNTER — Other Ambulatory Visit: Payer: Self-pay | Admitting: *Deleted

## 2015-02-12 MED ORDER — TRAZODONE HCL 50 MG PO TABS: ORAL_TABLET | ORAL | Status: DC

## 2015-02-12 NOTE — Telephone Encounter (Signed)
Last office visit 09/20/20166.  Last refilled 01/16/2015 for #45 with no refills.  Ok to refill?

## 2015-03-02 ENCOUNTER — Other Ambulatory Visit: Payer: Self-pay | Admitting: *Deleted

## 2015-03-02 NOTE — Telephone Encounter (Signed)
Last office visit 02/10/2015.  Last refilled 02/12/2015 for #45 with no refills.  Refill?

## 2015-03-03 MED ORDER — TRAZODONE HCL 50 MG PO TABS: ORAL_TABLET | ORAL | Status: DC

## 2015-03-06 ENCOUNTER — Ambulatory Visit: Payer: Self-pay | Admitting: Surgery

## 2015-03-06 NOTE — H&P (Signed)
History of Present Illness Susan [name]. Tailor Westfall MD; 03/06/2015 9:54 AM) Patient words: umbilical hernia.  The patient is a 74 year old female who presents with an umbilical hernia. Referred by Dr. Eliezer Lofts for evaluation of umbilical hernia This is a 74 year old female who presents with a one-year history of some slight protrusion at her umbilicus. This has become larger over the last several months. It causes some mild discomfort but no significant pain. She denies any obstructive symptoms. There is no skin discoloration. This area has never been infected or had any drainage. She has had no previous abdominal surgery but has had multiple orthopedic surgeries. Other Problems Ventura Sellers, [place]; 03/06/2015 9:33 AM) Arthritis Emphysema Of Lung Umbilical Hernia Repair  Past Surgical History Ventura Sellers, [place]; 03/06/2015 9:33 AM) Foot Surgery Bilateral. Knee Surgery Bilateral.  Diagnostic Studies History Ventura Sellers, [place]; 03/06/2015 9:33 AM) Colonoscopy 5-10 years ago Pap Smear 1-5 years ago  Allergies Ventura Sellers, CMA; 03/06/2015 9:33 AM) No Known Drug Allergies 03/06/2015  Medication History Ventura Sellers, CMA; 03/06/2015 9:35 AM) Zovirax (400MG  Tablet, Oral) Active. Albuterol Sulfate ((2.5 MG/3ML)0.083% Nebulized Soln, Inhalation) Active. Flonase (50MCG/ACT Suspension, Nasal) Active. Multi Vitamin Daily (Oral) Active. Probiotic Pearls (Oral) Active. Requip (3MG  Tablet, Oral) Active. Spiriva HandiHaler (18MCG Capsule, Inhalation) Active. TraMADol HCl (50MG  Tablet, Oral) Active. TraZODone HCl (50MG  Tablet, Oral) Active.  Social History Ventura Sellers, [place]; 03/06/2015 9:33 AM) Alcohol use Occasional alcohol use. Caffeine use Coffee. No drug use Tobacco use Former smoker.  Family History Ventura Sellers, [place]; 03/06/2015 9:33 AM) Arthritis Father, Mother. Diabetes Mellitus Mother. Heart Disease  Father. Hypertension Father. Respiratory Condition Father, Mother.  Pregnancy / Birth History Ventura Sellers, [place]; 03/06/2015 9:33 AM) Age at menarche 78 years. Age of menopause 80-50 Gravida 3 Maternal age 9-20 Para 3     Review of Systems [name] CMA; 03/06/2015 9:33 AM) General Not Present- Appetite Loss, Chills, Fatigue, Fever, Night Sweats, Weight Gain and Weight Loss. Skin Not Present- Change in Wart/Mole, Dryness, Hives, Jaundice, New Lesions, Non-Healing Wounds, Rash and Ulcer. HEENT Not Present- Earache, Hearing Loss, Hoarseness, Nose Bleed, Oral Ulcers, Ringing in the Ears, Seasonal Allergies, Sinus Pain, Sore Throat, Visual Disturbances, Wears glasses/contact lenses and Yellow Eyes. Respiratory Present- Difficulty Breathing. Not Present- Bloody sputum, Chronic Cough, Snoring and Wheezing. Breast Not Present- Breast Mass, Breast Pain, Nipple Discharge and Skin Changes. Cardiovascular Present- Shortness of Breath. Not Present- Chest Pain, Difficulty Breathing Lying Down, Leg Cramps, Palpitations, Rapid Heart Rate and Swelling of Extremities. Gastrointestinal Not Present- Abdominal Pain, Bloating, Bloody Stool, Change in Bowel Habits, Chronic diarrhea, Constipation, Difficulty Swallowing, Excessive gas, Gets full quickly at meals, Hemorrhoids, Indigestion, Nausea, Rectal Pain and Vomiting. Female Genitourinary Not Present- Frequency, Nocturia, Painful Urination, Pelvic Pain and Urgency. Musculoskeletal Not Present- Back Pain, Joint Pain, Joint Stiffness, Muscle Pain, Muscle Weakness and Swelling of Extremities. Neurological Not Present- Decreased Memory, Fainting, Headaches, Numbness, Seizures, Tingling, Tremor, Trouble walking and Weakness. Psychiatric Not Present- Anxiety, Bipolar, Change in Sleep Pattern, Depression, Fearful and Frequent crying. Endocrine Not Present- Cold Intolerance, Excessive Hunger, Hair Changes, Heat Intolerance, Hot flashes and New  Diabetes. Hematology Not Present- Easy Bruising, Excessive bleeding, Gland problems, HIV and Persistent Infections.  Vitals [employer] R. Brooks CMA; 03/06/2015 9:33 AM) 03/06/2015 9:33 AM Weight: 178.13 lb Height: 60in Body Surface Area: 1.85 m Body Mass Index: 34.79 kg/m Temp.: 98.34F(Oral)  Pulse: 72 (Regular)  BP: 136/88 (Sitting, Left Arm, Standard)  Physical Exam [name] MD; 03/06/2015 9:54 AM)  The physical exam findings are as follows: Note:WDWN in NAD HEENT: EOMI, sclera anicteric Neck: No masses, no thyromegaly Lungs: CTA bilaterally; normal respiratory effort CV: Regular rate and rhythm; no murmurs Abd: +bowel sounds, soft, non-tender, protruding hernia at upper side of umbilicus - partially reducible; fascial defect about 3 cm. Ext: Well-perfused; no edema Skin: Warm, dry; no sign of jaundice    Assessment & Plan Rodman Key K. Little Winton MD; 34/74/2595 6:38 AM)  UMBILICAL HERNIA WITHOUT OBSTRUCTION AND WITHOUT GANGRENE (K42.9)  Current Plans Schedule for Surgery - Umbilical hernia repair with mesh. The surgical procedure has been discussed with the patient. Potential risks, benefits, alternative treatments, and expected outcomes have been explained. All of the patient's questions at this time have been answered. The likelihood of reaching the patient's treatment goal is good. The patient understand the proposed surgical procedure and wishes to proceed. Pt Education - Pamphlet Given - Hernia Surgery: discussed with patient and provided information.  Susan [name]. [name], MD, [facility] Surgery  General/ Trauma Surgery  03/06/2015 9:55 AM

## 2015-03-09 ENCOUNTER — Encounter: Payer: Self-pay | Admitting: Family Medicine

## 2015-03-09 ENCOUNTER — Ambulatory Visit (INDEPENDENT_AMBULATORY_CARE_PROVIDER_SITE_OTHER): Payer: PPO | Admitting: Family Medicine

## 2015-03-09 VITALS — BP 140/82 | HR 75 | Temp 97.9°F | Ht 60.0 in | Wt 177.5 lb

## 2015-03-09 DIAGNOSIS — M25512 Pain in left shoulder: Secondary | ICD-10-CM | POA: Diagnosis not present

## 2015-03-09 DIAGNOSIS — M25511 Pain in right shoulder: Secondary | ICD-10-CM | POA: Diagnosis not present

## 2015-03-09 DIAGNOSIS — M19011 Primary osteoarthritis, right shoulder: Secondary | ICD-10-CM

## 2015-03-09 DIAGNOSIS — M19012 Primary osteoarthritis, left shoulder: Secondary | ICD-10-CM

## 2015-03-09 MED ORDER — METHYLPREDNISOLONE ACETATE 40 MG/ML IJ SUSP
80.0000 mg | Freq: Once | INTRAMUSCULAR | Status: AC
  Administered 2015-03-09: 80 mg via INTRA_ARTICULAR

## 2015-03-09 NOTE — Progress Notes (Signed)
Dr. Frederico Hamman T. Foy Mungia, MD, [facility] Primary [facility] and Sports Medicine [place], [address] Phone: [phone] Fax: [phone]  03/09/2015  Patient: Susan [name], MRN: [id number], DOB: 05/06/1941, 74 y.o.  Primary Physician:  Eliezer Lofts, MD  Chief Complaint: Shoulder Pain  Subjective:   Susan [name] is a 74 y.o. very pleasant female patient who presents with the following:  Ramsey arthritis, B:  Known GH OA. R shoulder last injection in 11/2014. Known XR and MRI proven severe OA on the R with chronic RTC and long head biceps tear for years  Not ready for reverse shoulder arthroplasty.  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  Shoulder has been bothering her for years and she has known severe glenohumeral arthritis. She has responded recently relatively well to intra-articular steroid injections.  S/p B knee replacements.   Past Medical History, Surgical History, Social History, Family History, Problem List, Medications, and Allergies have been reviewed and updated if relevant.  Patient Active Problem List   Diagnosis Date Noted  . Dizziness and giddiness 01/02/2015  . Nasal congestion due to prolonged use of decongestants 12/09/2014  . Chronic insomnia 12/09/2014  . Primary osteoarthritis of left knee 10/03/2014  . Primary osteoarthritis of right knee 08/08/2014  . Elevated blood-pressure reading without diagnosis of hypertension 07/31/2014  . Osteoarthritis of right knee, Severe 10/28/2013  . COPD exacerbation ([place]) 10/22/2013  . Glenohumeral arthritis, Severe 08/20/2013  . Fracture of superior pubic ramus ([place]) 04/23/2013  . COPD, mild ([place]) 03/25/2013  . Solitary pulmonary nodule 02/19/2013  . Chronic knee pain 01/15/2013  . Obstructive sleep apnea 10/11/2012  .  Umbilical hernia 71/24/5809  . Urinary incontinence, urge 01/17/2012  . Diastolic dysfunction 98/33/8250  . Preoperative cardiovascular examination 04/21/2011  . DDD (degenerative disc disease) 10/26/2010  . RESTLESS LEG SYNDROME 02/23/2010  . HIP REPLACEMENT, LEFT, HX OF 12/21/2009  . FOOT PAIN, BILATERAL 03/25/2009  . HYPERCHOLESTEROLEMIA 06/18/2008  . ALLERGIC RHINITIS 05/21/2008  . ANKLE PAIN, BILATERAL 05/21/2008  . DIVERTICULOSIS, COLON 11/26/2007  . HERPES SIMPLEX INFECTION, RECURRENT 10/17/2007    Past Medical History  Diagnosis Date  . Diverticulosis of colon (without mention of hemorrhage)   . 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 Acute peptic ulcer, unspecified site, with hemorrhage and perforation, without mention of obstruction   . Chronic airway obstruction, not elsewhere classified   . Ventral hernia   . Hematochezia   . Arthritis     Past Surgical History  Procedure Laterality Date  . Bunionectomy Bilateral   . Total hip arthroplasty  2007    left  . Appendectomy    . Tubal ligation    . Cosmetic surgery  1970's    abdomen. Tummy Tuck  . Foot surgery Bilateral dec 2015 right     left 3 yrs ago  . Total hip arthroplasty      bilateral   . 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 Total knee arthroplasty Left 10/03/2014    Procedure: LEFT TOTAL KNEE ARTHROPLASTY;  Surgeon: Rod Can, MD;  Location: [facility];  Service: Orthopedics;  Laterality: Left;    Social  History   Social History  . Marital Status: Married    Spouse Name: N/A  . Number of Children: 3  . Years of Education: N/A   Occupational History  . self employeed     Owns beauty shop   Social History Main Topics  . Smoking status: Former Smoker -- 0.50 packs/day for 40 years    Types: Cigarettes    Quit date: 08/29/2000   . Smokeless tobacco: Never Used  . Alcohol Use: 0.0 oz/week    0 Standard drinks or equivalent per week     Comment: rare  . Drug Use: No  . Sexual Activity: Not on file   Other Topics Concern  . Not on file   Social History Narrative   Diet: Moderate, low fat, skips meals, some fruit and veggies   Regular exercise:  No   HCPOA is [name] and [name],  Has living will, Not sure of code status (reviewed 2013)    Family History  Problem Relation Age of Onset  . Diabetes Mother   . Coronary artery disease Mother   . Lung disease Mother   . Heart disease Mother     CAD  . Coronary artery disease Father   . Kidney disease Father   . Diabetes Father   . Heart disease Father     CAD  . Cancer Neg Hx     No Known Allergies  Medication list reviewed and updated in full in [place].  GEN: No fevers, chills. Nontoxic. Primarily MSK c/o today. MSK: Detailed in the HPI GI: tolerating PO intake without difficulty Neuro: No numbness, parasthesias, or tingling associated. Otherwise the pertinent positives of the ROS are noted above.   Objective:   BP 140/82 mmHg  Pulse 75  Temp(Src) 97.9 F (36.6 C) (Oral)  Ht 5' (1.524 m)  Wt 177 lb 8 oz (80.513 kg)  BMI 34.67 kg/m2   GEN: WDWN, NAD, Non-toxic, Alert & Oriented x 3 HEENT: Atraumatic, Normocephalic.  Ears and Nose: No external deformity. EXTR: No clubbing/cyanosis/edema PSYCH: Normally interactive. Conversant. Not depressed or anxious appearing.  Calm demeanor.     lacks 10 of terminal abduction and flexion. Nontender along clavicle. Minimally tender at the before meals joint. Nontender at the humerus.   Strength testing in all directions is 5/5. Grip strength is 5/5.    Hawkins test and Neer testing is notably tender and there is grinding with movement and rotation of the humeral head.  Radiology: No results found.  Assessment and Plan:   Bilateral shoulder pain - Plan: methylPREDNISolone  acetate (DEPO-MEDROL) injection 80 mg, methylPREDNISolone acetate (DEPO-MEDROL) injection 80 mg  Glenohumeral arthritis, right  Glenohumeral arthritis, left  Procedure only  Intrarticular Shoulder Injection, R Verbal consent was obtained from the patient. Risks including infection explained and contrasted with benefits and alternatives. Patient prepped with Chloraprep and Ethyl Chloride used for anesthesia. An intraarticular shoulder injection was performed using the posterior approach. The patient tolerated the procedure well and had decreased pain post injection. No complications. Injection: 8 cc of Lidocaine 1% and 2 mL Depo-Medrol 40 mg. Needle: 22 gauge   Intrarticular Shoulder Injection, L Verbal consent was obtained from the patient. Risks including infection explained and contrasted with benefits and alternatives. Patient prepped with Chloraprep and Ethyl Chloride used for anesthesia. An intraarticular shoulder injection was performed using the posterior approach. The patient tolerated the procedure well and had decreased pain post injection. No complications. Injection: 8 cc of Lidocaine  1% and 2 mL Depo-Medrol 40 mg. Needle: 22 gauge   Follow-up: prn  Signed,  Vonzell Lindblad T. Maranatha Grossi, MD   Patient's Medications  New Prescriptions   No medications on file  Previous Medications   ACYCLOVIR (ZOVIRAX) 400 MG TABLET    TAKE ONE TABLET TWICE A DAY   ALBUTEROL (PROAIR HFA) 108 (90 BASE) MCG/ACT INHALER    Inhale 2 puffs into the lungs every 6 (six) hours as needed for wheezing or shortness of breath.   FLUTICASONE (FLONASE) 50 MCG/ACT NASAL SPRAY    USE TWO PUFFS INTO EACH NOSTRIL DAILY   MILK THISTLE PO    Take 1 tablet by mouth daily.   MULTIPLE VITAMIN (MULTIVITAMIN WITH MINERALS) TABS TABLET    Take 1 tablet by mouth daily.   PROBIOTIC PRODUCT (PROBIOTIC PO)    Take 1 tablet by mouth daily.   ROPINIROLE (REQUIP) 3 MG TABLET    TAKE ONE TABLET AT BEDTIME   TIOTROPIUM (SPIRIVA) 18  MCG INHALATION CAPSULE    Place 1 capsule (18 mcg total) into inhaler and inhale daily.   TRAMADOL (ULTRAM) 50 MG TABLET    TAKE ONE TO TWO TABLETS EVERY SIX HOURS AS NEEDED FOR PAIN   TRAZODONE (DESYREL) 50 MG TABLET    TAKE ONE AND ONE-HALF TABLETS AT BEDTIME. AS NEEDED FOR SLEEP   TURMERIC PO    Take 1 tablet by mouth daily.  Modified Medications   No medications on file  Discontinued Medications   No medications on file

## 2015-03-09 NOTE — Progress Notes (Signed)
Pre visit review using our clinic review tool, if applicable. No additional management support is needed unless otherwise documented below in the visit note. 

## 2015-03-18 ENCOUNTER — Other Ambulatory Visit: Payer: Self-pay | Admitting: Family Medicine

## 2015-03-18 NOTE — Telephone Encounter (Signed)
Last office visit 03/09/2015.  Last refilled 11/12/2013 for #60 with 1 refill.  Ok to refill?

## 2015-03-30 ENCOUNTER — Other Ambulatory Visit: Payer: Self-pay | Admitting: Family Medicine

## 2015-03-30 NOTE — Telephone Encounter (Signed)
Last office visit 03/09/2015 with Dr. Lorelei Pont.  Last refilled 03/03/2015 for #45 with no refills.  Ok to refill?

## 2015-04-24 ENCOUNTER — Other Ambulatory Visit: Payer: Self-pay | Admitting: Family Medicine

## 2015-04-24 NOTE — Telephone Encounter (Signed)
Last office visit 03/09/2015 with Dr. Lorelei Pont.  Last refilled 03/31/2015 for #30 with no refills.  Ok to refill?

## 2015-05-20 ENCOUNTER — Ambulatory Visit (INDEPENDENT_AMBULATORY_CARE_PROVIDER_SITE_OTHER): Payer: PPO | Admitting: Family Medicine

## 2015-05-20 ENCOUNTER — Encounter: Payer: Self-pay | Admitting: Family Medicine

## 2015-05-20 VITALS — BP 120/76 | HR 83 | Temp 97.7°F | Ht 60.0 in | Wt 184.2 lb

## 2015-05-20 DIAGNOSIS — J208 Acute bronchitis due to other specified organisms: Secondary | ICD-10-CM | POA: Diagnosis not present

## 2015-05-20 DIAGNOSIS — J320 Chronic maxillary sinusitis: Secondary | ICD-10-CM | POA: Diagnosis not present

## 2015-05-20 MED ORDER — DOXYCYCLINE HYCLATE 100 MG PO TABS: 100.0000 mg | ORAL_TABLET | Freq: Two times a day (BID) | ORAL | Status: DC

## 2015-05-20 NOTE — Progress Notes (Signed)
Dr. Frederico Hamman T. Isabell Bonafede, MD, [facility] Primary Care and Sports Medicine [place], [address] Phone: [phone] Fax: [phone]  05/20/2015  Patient: Susan [name], MRN: [id number], DOB: November 18, 1940, 74 y.o.  Primary Physician:  Eliezer Lofts, MD   Chief Complaint  Patient presents with  . Cough    Dry x 2 weeks   Subjective:   Susan [name] is a 74 y.o. very pleasant female patient who presents with the following:   74 year old female with a history of long-standing smoking use, emphysema, mild COPD requiring Spiriva and occasional albuterol use who presents with a 2 week history of dry cough. She is not producing much sputum. She did have some nasal congestion and drainage in the back of her throat. She does not feel like she has had a fever, no chills, and no arthralgias.   She is having some tenderness in her sinuses, primarily in the right maxillary.  Cough - using a nose spray.  3 coughing last Saturday.   Using albuterol about once a day or twice a day   Past Medical History, Surgical History, Social History, Family History, Problem List, Medications, and Allergies have been reviewed and updated if relevant.  Patient Active Problem List   Diagnosis Date Noted  . Dizziness and giddiness 01/02/2015  . Nasal congestion due to prolonged use of decongestants 12/09/2014  . Chronic insomnia 12/09/2014  . Primary osteoarthritis of left knee 10/03/2014  . Primary osteoarthritis of right knee 08/08/2014  . Elevated blood-pressure reading without diagnosis of hypertension 07/31/2014  . Osteoarthritis of right knee, Severe 10/28/2013  . COPD exacerbation ([place]) 10/22/2013  . Glenohumeral arthritis, Severe 08/20/2013  . Fracture of superior pubic ramus ([place]) 04/23/2013  . COPD, mild ([place]) 03/25/2013  . Solitary pulmonary nodule 02/19/2013  . Chronic knee pain 01/15/2013  . Obstructive sleep apnea 10/11/2012  . Umbilical hernia [id number]  .  Urinary incontinence, urge 01/17/2012  . Diastolic dysfunction [id number]  . Preoperative cardiovascular examination 04/21/2011  . DDD (degenerative disc disease) 10/26/2010  . RESTLESS LEG SYNDROME 02/23/2010  . HIP REPLACEMENT, LEFT, HX OF 12/21/2009  . FOOT PAIN, BILATERAL 03/25/2009  . HYPERCHOLESTEROLEMIA 06/18/2008  . ALLERGIC RHINITIS 05/21/2008  . ANKLE PAIN, BILATERAL 05/21/2008  . DIVERTICULOSIS, COLON 11/26/2007  . HERPES SIMPLEX INFECTION, RECURRENT 10/17/2007    Past Medical History  Diagnosis Date  . Diverticulosis of colon (without mention of hemorrhage)   . 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 Acute peptic ulcer, unspecified site, with hemorrhage and perforation, without mention of obstruction   . Chronic airway obstruction, not elsewhere classified   . Ventral hernia   . Hematochezia   . Arthritis     Past Surgical History  Procedure Laterality Date  . Bunionectomy Bilateral   . Total hip arthroplasty  2007    left  . Appendectomy    . Tubal ligation    . Cosmetic surgery  1970's    abdomen. Tummy Tuck  . Foot surgery Bilateral dec 2015 right     left 3 yrs ago  . Total hip arthroplasty      bilateral   . 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 Total knee arthroplasty Left 10/03/2014    Procedure: LEFT TOTAL KNEE ARTHROPLASTY;  Surgeon: Rod Can, MD;  Location: [facility];  Service: Orthopedics;  Laterality: Left;    Social History   Social History  . Marital Status: Married    Spouse Name: N/A  . Number of Children: 3  . Years of Education: N/A   Occupational History  . self employeed     Owns beauty shop   Social History Main Topics  . Smoking status: Former Smoker -- 0.50 packs/day for 40 years    Types: Cigarettes    Quit date: 08/29/2000  . Smokeless tobacco: Never  Used  . Alcohol Use: 0.0 oz/week    0 Standard drinks or equivalent per week     Comment: rare  . Drug Use: No  . Sexual Activity: Not on file   Other Topics Concern  . Not on file   Social History Narrative   Diet: Moderate, low fat, skips meals, some fruit and veggies   Regular exercise:  No   HCPOA is [name] and [name],  Has living will, Not sure of code status (reviewed 2013)    Family History  Problem Relation Age of Onset  . Diabetes Mother   . Coronary artery disease Mother   . Lung disease Mother   . Heart disease Mother     CAD  . Coronary artery disease Father   . Kidney disease Father   . Diabetes Father   . Heart disease Father     CAD  . Cancer Neg Hx     No Known Allergies  Medication list reviewed and updated in full in [place].  ROS: GEN: Acute illness details above GI: Tolerating PO intake GU: maintaining adequate hydration and urination Pulm: No SOB Interactive and getting along well at home.  Otherwise, ROS is as per the HPI.   Objective:   BP 120/76 mmHg  Pulse 83  Temp(Src) 97.7 F (36.5 C) (Oral)  Ht 5' (1.524 m)  Wt 184 lb 4 oz (83.575 kg)  BMI 35.98 kg/m2  SpO2 96%   Gen: WDWN, NAD; alert,appropriate and cooperative throughout exam  HEENT: Normocephalic and atraumatic. Throat clear, w/o exudate, no LAD, R TM clear, L TM - good landmarks, No fluid present. rhinnorhea.  Left frontal and maxillary sinuses: nonTender Right frontal and maxillary sinuses: Tender  Neck: No ant or post LAD CV: RRR, No M/G/R Pulm: Breathing comfortably in no resp distress. no w/c/r Abd: S,NT,ND,+BS Extr: no c/c/e Psych: full affect, pleasant    Laboratory and Imaging Data:  Assessment and Plan:   Acute bronchitis due to other specified organisms  Right maxillary sinusitis   CPAP appears to be stable right now. Lungs are relatively clear, but prolonged cough. We will give her some antibiotics to cover for the sinus pain  that she is having. Continue albuterol when necessary.  Follow-up: No Follow-up on file.  New Prescriptions   DOXYCYCLINE (VIBRA-TABS) 100 MG TABLET    Take 1 tablet (100 mg total) by mouth 2 (two) times daily.   Modified Medications   No medications on file   No orders of the defined types were placed in this encounter.    Signed,  Maud Deed. Valeta Paz, MD   Patient's Medications  New Prescriptions   DOXYCYCLINE (VIBRA-TABS) 100 MG TABLET    Take 1 tablet (100 mg total) by mouth 2 (two) times daily.  Previous Medications   ACYCLOVIR (ZOVIRAX) 400 MG TABLET    TAKE ONE TABLET TWICE A DAY   ALBUTEROL (PROAIR HFA) 108 (90 BASE) MCG/ACT INHALER    Inhale  2 puffs into the lungs every 6 (six) hours as needed for wheezing or shortness of breath.   FLUTICASONE (FLONASE) 50 MCG/ACT NASAL SPRAY    USE TWO PUFFS INTO EACH NOSTRIL DAILY   MILK THISTLE PO    Take 1 tablet by mouth daily.   MULTIPLE VITAMIN (MULTIVITAMIN WITH MINERALS) TABS TABLET    Take 1 tablet by mouth daily.   PROBIOTIC PRODUCT (PROBIOTIC PO)    Take 1 tablet by mouth daily.   ROPINIROLE (REQUIP) 3 MG TABLET    TAKE ONE TABLET AT BEDTIME   TIOTROPIUM (SPIRIVA) 18 MCG INHALATION CAPSULE    Place 1 capsule (18 mcg total) into inhaler and inhale daily.   TRAMADOL (ULTRAM) 50 MG TABLET    TAKE ONE TO TWO TABLETS EVERY SIX HOURS AS NEEDED FOR PAIN   TRAZODONE (DESYREL) 50 MG TABLET    TAKE ONE AND ONE-HALF TABLETS AT BEDTIME. AS NEEDED FOR SLEEP   TURMERIC PO    Take 1 tablet by mouth daily.  Modified Medications   No medications on file  Discontinued Medications   No medications on file

## 2015-05-20 NOTE — Progress Notes (Signed)
Pre visit review using our clinic review tool, if applicable. No additional management support is needed unless otherwise documented below in the visit note. 

## 2015-05-26 ENCOUNTER — Other Ambulatory Visit: Payer: Self-pay

## 2015-05-26 MED ORDER — TRAZODONE HCL 50 MG PO TABS: ORAL_TABLET | ORAL | Status: DC

## 2015-05-26 NOTE — Telephone Encounter (Signed)
Pt left v/m requesting refill trazodone to [facility]. Last refilled # 45 on 04/24/15. Last acute visit where pt saw Dr Diona Browner was 02/10/15.

## 2015-05-27 NOTE — Telephone Encounter (Signed)
Prescription called in to [facility].

## 2015-05-27 NOTE — Telephone Encounter (Signed)
Pt left v/m requesting status of trazodone refill; pt advised done.

## 2015-06-03 ENCOUNTER — Ambulatory Visit: Payer: PPO | Admitting: Primary Care

## 2015-06-03 ENCOUNTER — Ambulatory Visit (INDEPENDENT_AMBULATORY_CARE_PROVIDER_SITE_OTHER): Payer: Medicare Other | Admitting: Podiatry

## 2015-06-03 ENCOUNTER — Encounter: Payer: Self-pay | Admitting: Podiatry

## 2015-06-03 ENCOUNTER — Telehealth: Payer: Self-pay | Admitting: *Deleted

## 2015-06-03 ENCOUNTER — Ambulatory Visit (INDEPENDENT_AMBULATORY_CARE_PROVIDER_SITE_OTHER): Payer: Medicare Other

## 2015-06-03 VITALS — BP 141/78 | HR 95 | Resp 18

## 2015-06-03 DIAGNOSIS — M79674 Pain in right toe(s): Secondary | ICD-10-CM

## 2015-06-03 DIAGNOSIS — R0989 Other specified symptoms and signs involving the circulatory and respiratory systems: Secondary | ICD-10-CM

## 2015-06-03 DIAGNOSIS — I739 Peripheral vascular disease, unspecified: Secondary | ICD-10-CM | POA: Diagnosis not present

## 2015-06-03 NOTE — Progress Notes (Signed)
   Subjective:    Patient ID: Susan [name], female    DOB: 10-Feb-1941, 75 y.o.   MRN: [id number]  HPI: She presents today with chief complaint of severely deformed feet to give her considerable pain right over issue forceps and fifth metatarsals right foot. 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 She states that she's had arthritis in the past and has had severe major reconstruction of the bilateral foot. The right foot seems to be the worst and is currently the most painful with the area of pain overlying the fifth metatarsal and she points to it. She's done nothing to try to treat this.    Review of Systems  All other systems reviewed and are negative.      Objective:   Physical Exam: She presents today as a new patient stress vital signs stable she is alert and oriented 3. Pulses are palpable but minimally so. Capillary fill time is sluggish.. Neurologic sensorium is intact deep tendon reflexes are intact muscle strength is non-elicitable. Orthopedic evaluation demonstrates severe malformed foot with an elevated fifth metatarsal resulting in superficial irritation. I feel that this will result in skin breakdown and superficial ulceration. She does relate a history of long-term wound with wound VAC to her medial right foot possibly associated with vascular problems.        Assessment & Plan:  Assessment: Peripheral vascular disease with irritation from fifth metatarsal of the right foot.  Plan: Placed padding over the dorsal aspect of the fifth metatarsal right which may be necessary to resected some point in time however prior to that she most evaluated for blood flow to the bilateral lower extremity is. We are sending her to the vein and vascular doctors for this arterial study.

## 2015-06-03 NOTE — Telephone Encounter (Signed)
Dr. Milinda Pointer ordered B/L arterial dopplers.  Faxed to [facility].

## 2015-06-12 ENCOUNTER — Encounter: Payer: Self-pay | Admitting: Podiatry

## 2015-06-12 DIAGNOSIS — R0989 Other specified symptoms and signs involving the circulatory and respiratory systems: Secondary | ICD-10-CM

## 2015-06-12 NOTE — Telephone Encounter (Addendum)
-----   Message from Rip Harbour, [facility] sent at 06/12/2015 10:30 AM EST ----- Regarding: Vascular Consult referral  Patient had abnormal doppler study and needs to see vasc doc for follow up-doppler was done at [facility]. Thanks!  Referral, pt clinicals and demographics faxed to [facility].

## 2015-06-15 ENCOUNTER — Ambulatory Visit: Payer: PPO | Admitting: Primary Care

## 2015-06-16 ENCOUNTER — Telehealth: Payer: Self-pay | Admitting: Primary Care

## 2015-06-16 NOTE — Telephone Encounter (Signed)
Pt did not come in for their appt today for an acute visit. Please let me know if pt needs to be contacted immediately for follow up or no follow up needed. Best phone number to contact pt is [phone].

## 2015-06-16 NOTE — Telephone Encounter (Signed)
No need for follow up. Thanks.

## 2015-06-19 ENCOUNTER — Other Ambulatory Visit: Payer: Self-pay | Admitting: Family Medicine

## 2015-06-19 MED ORDER — ALBUTEROL SULFATE HFA 108 (90 BASE) MCG/ACT IN AERS: 2.0000 | INHALATION_SPRAY | Freq: Four times a day (QID) | RESPIRATORY_TRACT | Status: DC | PRN

## 2015-06-19 NOTE — Telephone Encounter (Signed)
Insurance no longer covers ProAir.  Ok to change to ventolin or proventil?

## 2015-06-19 NOTE — Addendum Note (Signed)
Addended by: Carter Kitten on: 06/19/2015 02:17 PM   Modules accepted: Orders

## 2015-06-23 ENCOUNTER — Encounter: Payer: Self-pay | Admitting: Family Medicine

## 2015-06-23 ENCOUNTER — Telehealth: Payer: Self-pay | Admitting: Pulmonary Disease

## 2015-06-23 ENCOUNTER — Ambulatory Visit (INDEPENDENT_AMBULATORY_CARE_PROVIDER_SITE_OTHER): Payer: Medicare Other | Admitting: Family Medicine

## 2015-06-23 VITALS — BP 130/80 | HR 84 | Temp 97.7°F | Ht 60.0 in | Wt 184.5 lb

## 2015-06-23 DIAGNOSIS — R3915 Urgency of urination: Secondary | ICD-10-CM | POA: Diagnosis not present

## 2015-06-23 DIAGNOSIS — J441 Chronic obstructive pulmonary disease with (acute) exacerbation: Secondary | ICD-10-CM

## 2015-06-23 DIAGNOSIS — N3000 Acute cystitis without hematuria: Secondary | ICD-10-CM

## 2015-06-23 DIAGNOSIS — Z87440 Personal history of urinary (tract) infections: Secondary | ICD-10-CM | POA: Insufficient documentation

## 2015-06-23 LAB — POC URINALSYSI DIPSTICK (AUTOMATED)
BILIRUBIN UA: NEGATIVE
GLUCOSE UA: NEGATIVE
KETONES UA: NEGATIVE
Nitrite, UA: POSITIVE
Protein, UA: POSITIVE
RBC UA: POSITIVE
SPEC GRAV UA: 1.02
UROBILINOGEN UA: 0.2
pH, UA: 6

## 2015-06-23 MED ORDER — PREDNISONE 20 MG PO TABS: ORAL_TABLET | ORAL | Status: DC

## 2015-06-23 MED ORDER — TIOTROPIUM BROMIDE MONOHYDRATE 18 MCG IN CAPS: 18.0000 ug | ORAL_CAPSULE | Freq: Every day | RESPIRATORY_TRACT | Status: DC

## 2015-06-23 MED ORDER — CEPHALEXIN 500 MG PO CAPS: 500.0000 mg | ORAL_CAPSULE | Freq: Three times a day (TID) | ORAL | Status: DC

## 2015-06-23 NOTE — Progress Notes (Signed)
   Subjective:    Patient ID: Susan [name], female    DOB: Mar 04, 1941, 75 y.o.   MRN: [id number]  Cough This is a new problem. The current episode started 1 to 4 weeks ago ( 2 weeks). The problem has been rapidly worsening. The problem occurs constantly. The cough is non-productive. Associated symptoms include nasal congestion, shortness of breath and wheezing. Pertinent negatives include no chills, ear pain, fever, myalgias, postnasal drip or sore throat. Nothing aggravates the symptoms. Risk factors for lung disease include smoking/tobacco exposure (former smoker). She has tried a beta-agonist inhaler for the symptoms. The treatment provided mild relief. Her past medical history is significant for COPD. There is no history of asthma or pneumonia.  Urinary Frequency  This is a new problem. The current episode started 1 to 4 weeks ago. The problem has been unchanged. The quality of the pain is described as aching. There has been no fever. She is not sexually active. There is no history of pyelonephritis. Associated symptoms include frequency. Pertinent negatives include no chills, hematuria, hesitancy, nausea, possible pregnancy, urgency or vomiting. She has tried increased fluids for the symptoms. The treatment provided mild relief. There is no history of catheterization, kidney stones, recurrent UTIs, urinary stasis or a urological procedure.     Social History /Family History/Past Medical History reviewed and updated if needed.   Review of Systems  Constitutional: Negative for fever and chills.  HENT: Negative for ear pain, postnasal drip and sore throat.   Respiratory: Positive for cough, shortness of breath and wheezing.   Gastrointestinal: Negative for nausea and vomiting.  Genitourinary: Positive for frequency. Negative for hesitancy, urgency and hematuria.  Musculoskeletal: Negative for myalgias.       Objective:   Physical Exam  Constitutional: Vital signs are normal. She  appears well-developed and well-nourished. She is cooperative.  Non-toxic appearance. She does not appear ill. No distress.  HENT:  Head: Normocephalic.  Right Ear: Hearing, tympanic membrane, external ear and ear canal normal. Tympanic membrane is not erythematous, not retracted and not bulging.  Left Ear: Hearing, tympanic membrane, external ear and ear canal normal. Tympanic membrane is not erythematous, not retracted and not bulging.  Nose: Mucosal edema and rhinorrhea present. Right sinus exhibits no maxillary sinus tenderness and no frontal sinus tenderness. Left sinus exhibits no maxillary sinus tenderness and no frontal sinus tenderness.  Mouth/Throat: Uvula is midline, oropharynx is clear and moist and mucous membranes are normal.  Eyes: Conjunctivae, EOM and lids are normal. Pupils are equal, round, and reactive to light. Lids are everted and swept, no foreign bodies found.  Neck: Trachea normal and normal range of motion. Neck supple. Carotid bruit is not present. No thyroid mass and no thyromegaly present.  Cardiovascular: Normal rate, regular rhythm, S1 normal, S2 normal, normal heart sounds, intact distal pulses and normal pulses.  Exam reveals no gallop and no friction rub.   No murmur heard. Pulmonary/Chest: Effort normal. No tachypnea. No respiratory distress. She has no decreased breath sounds. She has wheezes. She has no rhonchi. She has no rales.  Neurological: She is alert.  Skin: Skin is warm, dry and intact. No rash noted.  Psychiatric: Her speech is normal and behavior is normal. Judgment normal. Her mood appears not anxious. Cognition and memory are normal. She does not exhibit a depressed mood.          Assessment & Plan:

## 2015-06-23 NOTE — Assessment & Plan Note (Signed)
Push fluids, complete antibiotics. Send for culture.

## 2015-06-23 NOTE — Telephone Encounter (Signed)
Patient Returned call [phone]

## 2015-06-23 NOTE — Assessment & Plan Note (Addendum)
No clear bacterial infection involved. Treat with steroid course and albuterol inhaler. Will be on antibitocs for urinary issues any way.   She is not taking spiriva daily.. Did not understand this was maintenance.. Told to restart. IF not decreasing use of albuterol.. Consider symbicort.

## 2015-06-23 NOTE — Patient Instructions (Signed)
Start antibiotics for UTI.  Push fluids.  Complete steroid taper for COPD exacerbation.  Use albuterol as needed for wheeze, rescue.  Restart spiriva daily maintenance.  Call if not improving as expected.

## 2015-06-23 NOTE — Telephone Encounter (Signed)
lmtcb X1 for pt  

## 2015-06-23 NOTE — Progress Notes (Signed)
Pre visit review using our clinic review tool, if applicable. No additional management support is needed unless otherwise documented below in the visit note. 

## 2015-06-23 NOTE — Telephone Encounter (Signed)
Called spoke with pt. She reports at last OV she was told to try symbicort instead of spiriva. She likes the symbicort better and has been on this every since. RX for spiriva sent in. Nothing further needed

## 2015-06-26 ENCOUNTER — Other Ambulatory Visit: Payer: Self-pay | Admitting: *Deleted

## 2015-06-26 ENCOUNTER — Other Ambulatory Visit: Payer: Self-pay | Admitting: Family Medicine

## 2015-06-26 LAB — URINE CULTURE: Colony Count: >=100000

## 2015-06-26 MED ORDER — TRAZODONE HCL 50 MG PO TABS: ORAL_TABLET | ORAL | Status: DC

## 2015-06-26 NOTE — Telephone Encounter (Signed)
Pt left voicemail at Triage, requesting a call back to discuss medications issues.  Called pt back an she is requesting a refill of her trazodone last refilled on 05/26/15 #45 with 0 refills, pt request call back once filled  ([facility] is [facility])

## 2015-06-26 NOTE — Telephone Encounter (Signed)
Left message for Susan [name] that her refill has been sent to her pharmacy.

## 2015-06-29 MED ORDER — TRAZODONE HCL 50 MG PO TABS: ORAL_TABLET | ORAL | Status: DC

## 2015-06-29 NOTE — Addendum Note (Signed)
Addended by: Carter Kitten on: 06/29/2015 09:05 AM   Modules accepted: Orders

## 2015-06-29 NOTE — Telephone Encounter (Addendum)
Tried to call Susan [name] to verify received trazodone refill on 06/26/15 but H/T is not open yet; put [facility] med order in Dr Rometta Emery in box.

## 2015-06-29 NOTE — Telephone Encounter (Addendum)
Original refill was set on call in.  This medication can be sent electronically so I thought it had been sent in by Dr. Diona Browner.  Refill resent to [facility].  Ms. Maqueda notified.

## 2015-06-29 NOTE — Telephone Encounter (Signed)
PLEASE NOTE: All timestamps contained within this report are represented as [place] Standard Time. CONFIDENTIALTY NOTICE: This fax transmission is intended only for the addressee. It contains information that is legally privileged, confidential or otherwise protected from use or disclosure. If you are not the intended recipient, you are strictly prohibited from reviewing, disclosing, copying using or disseminating any of this information or taking any action in reliance on or regarding this information. If you have received this fax in error, please notify us immediately by telephone so that we can arrange for its return to [place]. Phone: [phone], Toll-Free: [phone], Fax: [phone] Page: 1 of 3 Call Id: [id number] [place] Patient Name: Susan [name] Gender: Female DOB: [birth year] Age: 75 Y 86 M 4 D Return Phone Number: [phone] (Primary) Address: City/State/Zip: [place] Client [place] Night - Client Client Site [place] Physician Diona Browner, [place] Contact Type Call Call Type Triage / Clinical Relationship To Patient Self Return Phone Number [phone] (Primary) Chief Complaint Prescription Refill or Medication Request (non symptomatic) Initial Comment Caller says a refill was to be called in yesterday from office, but was not Nurse Assessment Nurse: Markus Daft, RN, Sherre Poot Date/Time (Eastern Time): 06/27/2015 10:35:35 AM Please select the assessment type ---Refill Additional Documentation ---Caller states that she was told by office that Trazadone refill was to be called in yesterday, but it was not. She takes this for insomnia. She is also on Prednisone for a cough and so this makes her insomnia worse. Supposed to be called to [facility] in [place]. She is doing ok right now, and declined triaged and seen  at office yesterday and office called her yesterday to see how she was doing. Does the patient have enough medication to last until the office opens? ---No Additional Documentation ---RN will confirm with pharmacy, and caller can get a small dose til office reopens. Caller verb. understanding. Guidelines Guideline Title Affirmed Question Affirmed Notes Nurse Date/Time (Eastern Time) Disp. Time Susan [name] Time) Disposition Final User 06/27/2015 10:44:33 AM Pharmacy Call Markus Daft, RN, Sherre Poot Reason: [facility] pharmacist, Gerald Stabs, confirmed Trazadone 50 mg 1 &  at [facility] as needed #45 given last on 05/27/15. He was given a small refill for now. 06/27/2015 10:44:47 AM Clinical Call Yes Markus Daft, RN, Sherre Poot After Care Instructions Given Call Event Type User Date / Time Description PLEASE NOTE: All timestamps contained within this report are represented as [place] Standard Time. CONFIDENTIALTY NOTICE: This fax transmission is intended only for the addressee. It contains information that is legally privileged, confidential or otherwise protected from use or disclosure. If you are not the intended recipient, you are strictly prohibited from reviewing, disclosing, copying using or disseminating any of this information or taking any action in reliance on or regarding this information. If you have received this fax in error, please notify us immediately by telephone so that we can arrange for its return to [place]. Phone: [phone], Toll-Free: [phone], Fax: [phone] Page: 2 of 3 Call Id: [id number] Verbal Orders/Maintenance Medications Medication Refill Route Dosage Regime Duration Admin Instructions User Name Trazadone 50 mg Yes Oral 1 & 1/2 tabs [facility] PRN #5 with no refills Markus Daft, RN, Windy PLEASE NOTE: All timestamps contained within this report are represented as [place] Standard Time. CONFIDENTIALTY NOTICE: This fax transmission is intended only for the addressee. It contains information that is  legally privileged, confidential or otherwise protected from use  or disclosure. If you are not the intended recipient, you are strictly prohibited from reviewing, disclosing, copying using or disseminating any of this information or taking any action in reliance on or regarding this information. If you have received this fax in error, please notify us immediately by telephone so that we can arrange for its return to [place]. Phone: [phone], Toll-Free: [phone], Fax: [phone] Page: 3 of 3 Call Id: [phone] [facility] [address] [place] [address] [phone] Fax: [phone] [place] Night - Client [place] - Night Date: 06/27/2015 From: [facility] Department To: Bedsole, Amy Please sign the order for the approved drug(s) given by our call center nurse on your behalf. Fax to [phone] within 5 business days. Thank you. Date Susan [name] Time): 06/27/2015 10:16:52 AM Triage RN: [name], RN NAME: Susan [name] PHONE NUMBER: [id number] (Primary) BIRTHDATE: [birth year] ADDRESS: CITY/STATE/ZIP: [place] CALLER: Self NAME: Rx Given Medication Refill Route Dosage Regime Duration Admin Instructions Trazadone 50 mg Yes Oral 1 & 1/2 tabs [facility] PRN #5 with no refills MD Signature Date

## 2015-07-01 ENCOUNTER — Ambulatory Visit (INDEPENDENT_AMBULATORY_CARE_PROVIDER_SITE_OTHER): Payer: Medicare Other | Admitting: Podiatry

## 2015-07-01 ENCOUNTER — Encounter: Payer: Self-pay | Admitting: Podiatry

## 2015-07-01 VITALS — BP 136/79 | HR 99 | Resp 18

## 2015-07-01 DIAGNOSIS — M79674 Pain in right toe(s): Secondary | ICD-10-CM | POA: Diagnosis not present

## 2015-07-01 DIAGNOSIS — I739 Peripheral vascular disease, unspecified: Secondary | ICD-10-CM

## 2015-07-01 NOTE — Progress Notes (Signed)
She presents today for follow-up of her ulceration fifth metatarsal head right foot. She states that she went to see the vascular doctor and was told that everything seems to be okay. She has taken the liberty to pad the ulceration to try to keep pressure off of it. She states that it seems to be healed up at this point.  Objective: Vital signs are stable she is alert and oriented 3 ulcers are minimally palpable bilateral. Peripheral vascular disease to the left lower extremity limited to the right lower extremity. Ulceration fifth metatarsal head dorsal lateral aspect is completely healed at this point it is no signs of infection.  Assessment: History of peripheral vascular disease and ulceration fifth met right.  Plan: Continue to pad this on a regular basis and consider having shoe gear stretched to accommodate her tailor's bunion deformity and her foot abnormality area 

## 2015-07-13 ENCOUNTER — Telehealth: Payer: Self-pay | Admitting: Family Medicine

## 2015-07-13 NOTE — Telephone Encounter (Signed)
Unable to reach pt by phone.

## 2015-07-13 NOTE — Telephone Encounter (Signed)
[place]  Patient Name: Susan [name]  DOB: 1940/12/16    Initial Comment Caller states having shortness of breath for past couple wks. Using inhalers 3-4 times a day but not helping much.   Nurse Assessment  Nurse: Justine Null, RN, Rodena Piety Date/Time Eilene Ghazi Time): 07/13/2015 4:09:47 PM  Confirm and document reason for call. If symptomatic, describe symptoms. You must click the next button to save text entered. ---Caller states having shortness of breath for past couple wks. Using inhalers 3-4 times a day but not helping much. Caller stated that has had no cold and has no fevers and has no cough  Has the patient traveled out of the country within the last 30 days? ---No  Does the patient have any new or worsening symptoms? ---Yes  Will a triage be completed? ---Yes  Related visit to physician within the last 2 weeks? ---No  Does the PT have any chronic conditions? (i.e. diabetes, asthma, etc.) ---Yes  List chronic conditions. ---COPD  Is this a behavioral health or substance abuse call? ---No     Guidelines    Guideline Title Affirmed Question Affirmed Notes  Breathing Difficulty [1] MILD difficulty breathing (e.g., minimal/no SOB at rest, SOB with walking, pulse <100) AND [2] NEW-onset or WORSE than normal    Final Disposition User   See Physician within 4 Hours (or PCP triage) Justine Null, RN, Rodena Piety    Referrals  [place] REFUSED   Disagree/Comply: Disagree  Disagree/Comply Reason: Disagree with instructions

## 2015-07-14 ENCOUNTER — Encounter: Payer: Self-pay | Admitting: Internal Medicine

## 2015-07-14 ENCOUNTER — Ambulatory Visit (INDEPENDENT_AMBULATORY_CARE_PROVIDER_SITE_OTHER): Payer: Medicare Other | Admitting: Internal Medicine

## 2015-07-14 VITALS — BP 130/80 | HR 89 | Temp 97.6°F | Wt 186.0 lb

## 2015-07-14 DIAGNOSIS — J449 Chronic obstructive pulmonary disease, unspecified: Secondary | ICD-10-CM

## 2015-07-14 MED ORDER — BUDESONIDE-FORMOTEROL FUMARATE 160-4.5 MCG/ACT IN AERO: 2.0000 | INHALATION_SPRAY | Freq: Two times a day (BID) | RESPIRATORY_TRACT | Status: DC

## 2015-07-14 NOTE — Progress Notes (Signed)
Subjective:    Patient ID: Susan [name], female    DOB: May 25, 1940, 75 y.o.   MRN: [id number]  HPI  Pt presents to the clinic today with c/o shortness of breaths. This has been going on for months but has progressively been getting worse. She has not shortness of breath at rest, only with exertion. She denies chest pain with the shortness  of breath. She does have COPD. She is taking Spiriva 18 mcg daily. She is using her Albuterol inhaler 3 times a day. She has seen Dr. Lake Bells in the past. She does not smoke.  Review of Systems      Past Medical History  Diagnosis Date  . Diverticulosis of colon (without mention of hemorrhage)   . 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 Acute peptic ulcer, unspecified site, with hemorrhage and perforation, without mention of obstruction   . Chronic airway obstruction, not elsewhere classified   . Ventral hernia   . Hematochezia   . Arthritis     Current Outpatient Prescriptions  Medication Sig Dispense Refill  . acyclovir (ZOVIRAX) 400 MG tablet TAKE ONE TABLET TWICE A DAY 60 tablet 3  . albuterol (PROVENTIL HFA;VENTOLIN HFA) 108 (90 Base) MCG/ACT inhaler Inhale 2 puffs into the lungs every 6 (six) hours as needed for wheezing or shortness of breath. 1 Inhaler 5  . fluticasone (FLONASE) 50 MCG/ACT nasal spray USE TWO PUFFS INTO EACH NOSTRIL DAILY 16 g 5  . MILK THISTLE PO Take 1 tablet by mouth daily.    . Multiple Vitamin (MULTIVITAMIN WITH MINERALS) TABS tablet Take 1 tablet by mouth daily.    . Probiotic Product (PROBIOTIC PO) Take 1 tablet by mouth daily.    Marland Kitchen rOPINIRole (REQUIP) 3 MG tablet TAKE ONE TABLET AT BEDTIME 30 tablet 11  . tiotropium (SPIRIVA) 18 MCG inhalation capsule Place 1 capsule (18 mcg total) into inhaler and inhale daily. 30 capsule 5  . traMADol (ULTRAM) 50 MG tablet TAKE ONE TO TWO TABLETS EVERY SIX HOURS AS NEEDED FOR PAIN 60 tablet 2  . traZODone (DESYREL) 50 MG tablet TAKE ONE AND ONE-HALF TABLETS AT  BEDTIME. AS NEEDED FOR SLEEP 45 tablet 0  . TURMERIC PO Take 1 tablet by mouth daily.     No current facility-administered medications for this visit.   Facility-Administered Medications Ordered in Other Visits  Medication Dose Route Frequency Provider Last Rate Last Dose  . dexamethasone (DECADRON) injection 4 mg  4 mg Intravenous Once Rod Can, MD      . ondansetron [facility]) 4 mg in sodium chloride 0.9 % 50 mL IVPB  4 mg Intravenous Once Rod Can, MD      . ondansetron (ZOFRAN) 4 mg in sodium chloride 0.9 % 50 mL IVPB  4 mg Intravenous Once Rod Can, MD        No Known Allergies  Family History  Problem Relation Age of Onset  . Diabetes Mother   . Coronary artery disease Mother   . Lung disease Mother   . Heart disease Mother     CAD  . Coronary artery disease Father   . Kidney disease Father   . Diabetes Father   . Heart disease Father     CAD  . Cancer Neg Hx     Social History   Social History  . Marital Status: Married    Spouse Name: N/A  . Number of Children: 3  . Years of Education: N/A   Occupational History  .  self employeed     Owns beauty shop   Social History Main Topics  . Smoking status: Former Smoker -- 0.50 packs/day for 40 years    Types: Cigarettes    Quit date: 08/29/2000  . Smokeless tobacco: Never Used  . Alcohol Use: 0.0 oz/week    0 Standard drinks or equivalent per week     Comment: rare  . Drug Use: No  . Sexual Activity: Not on file   Other Topics Concern  . Not on file   Social History Narrative   Diet: Moderate, low fat, skips meals, some fruit and veggies   Regular exercise:  No   HCPOA is [name] and [name],  Has living will, Not sure of code status (reviewed 2013)     Constitutional: Denies fever, malaise, fatigue, headache or abrupt weight changes.  HEENT: Denies eye pain, eye redness, ear pain, ringing in the ears, wax buildup, runny nose, nasal congestion, bloody nose, or sore  throat. Respiratory: Pt reports shortness of breath. Denies difficulty breathing, cough or sputum production.   Cardiovascular: Denies chest pain, chest tightness, palpitations or swelling in the hands or feet.   No other specific complaints in a complete review of systems (except as listed in HPI above).  Objective:   Physical Exam   BP 130/80 mmHg  Pulse 89  Temp(Src) 97.6 F (36.4 C) (Oral)  Wt 186 lb (84.369 kg)  SpO2 97% Wt Readings from Last 3 Encounters:  07/14/15 186 lb (84.369 kg)  06/23/15 184 lb 8 oz (83.689 kg)  05/20/15 184 lb 4 oz (83.575 kg)    General: Appears her stated age, in NAD. Cardiovascular: Normal rate and rhythm. S1,S2 noted.  No murmur, rubs or gallops noted.  Pulmonary/Chest: Normal effort and diminished breath sounds with intermittent expiratory wheeze noted. No respiratory distress. No rales or ronchi noted.  Neurological: Alert and oriented.   BMET    Component Value Date/Time   NA 139 11/19/2014 1008   K 3.8 11/19/2014 1008   CL 106 11/19/2014 1008   CO2 26 11/19/2014 1008   GLUCOSE 102* 11/19/2014 1008   BUN 15 11/19/2014 1008   CREATININE 0.70 11/19/2014 1008   CALCIUM 8.9 11/19/2014 1008   GFRNONAA >60 10/04/2014 0515   GFRAA >60 10/04/2014 0515    Lipid Panel     Component Value Date/Time   CHOL 164 11/19/2014 1008   TRIG 120.0 11/19/2014 1008   HDL 45.20 11/19/2014 1008   CHOLHDL 4 11/19/2014 1008   VLDL 24.0 11/19/2014 1008   LDLCALC 95 11/19/2014 1008    CBC    Component Value Date/Time   WBC 10.4 10/05/2014 0450   RBC 3.57* 10/05/2014 0450   HGB 10.4* 10/05/2014 0450   HCT 33.1* 10/05/2014 0450   PLT 148* 10/05/2014 0450   MCV 92.7 10/05/2014 0450   MCH 29.1 10/05/2014 0450   MCHC 31.4 10/05/2014 0450   RDW 14.1 10/05/2014 0450   LYMPHSABS 1.6 04/25/2013 1328   MONOABS 0.5 04/25/2013 1328   EOSABS 0.3 04/25/2013 1328   BASOSABS 0.0 04/25/2013 1328    Hgb A1C No results found for: HGBA1C      Assessment  & Plan:   COPD:  Deteriorated Stop Spiriva Start Symbicort, eRx sent to pharmacy, use as directed (also discussed rinsing mouth out after each use) She has a follow up with Dr. Lake Bells 08/2015 Chest CT from 10/2014 reviewed  RTC as needed or if symptoms persist or worsen

## 2015-07-14 NOTE — Telephone Encounter (Signed)
Pt needs appt to be seen. 

## 2015-07-14 NOTE — Patient Instructions (Signed)

## 2015-07-14 NOTE — Telephone Encounter (Signed)
Appointment scheduled today at 3:00pm with [name].

## 2015-07-14 NOTE — Progress Notes (Signed)
Pre visit review using our clinic review tool, if applicable. No additional management support is needed unless otherwise documented below in the visit note. 

## 2015-07-29 ENCOUNTER — Ambulatory Visit (INDEPENDENT_AMBULATORY_CARE_PROVIDER_SITE_OTHER)
Admission: RE | Admit: 2015-07-29 | Discharge: 2015-07-29 | Disposition: A | Discharge status: Home / Self Care | Payer: Medicare Other | Source: Ambulatory Visit | Attending: Internal Medicine | Admitting: Internal Medicine

## 2015-07-29 ENCOUNTER — Ambulatory Visit (INDEPENDENT_AMBULATORY_CARE_PROVIDER_SITE_OTHER): Payer: Medicare Other | Admitting: Internal Medicine

## 2015-07-29 ENCOUNTER — Encounter: Payer: Self-pay | Admitting: Internal Medicine

## 2015-07-29 ENCOUNTER — Other Ambulatory Visit (INDEPENDENT_AMBULATORY_CARE_PROVIDER_SITE_OTHER): Payer: Medicare Other

## 2015-07-29 ENCOUNTER — Other Ambulatory Visit: Payer: Self-pay | Admitting: Family Medicine

## 2015-07-29 ENCOUNTER — Telehealth: Payer: Self-pay | Admitting: Family Medicine

## 2015-07-29 VITALS — BP 128/76 | HR 100 | Ht 60.0 in | Wt 189.0 lb

## 2015-07-29 DIAGNOSIS — R0600 Dyspnea, unspecified: Secondary | ICD-10-CM

## 2015-07-29 DIAGNOSIS — J849 Interstitial pulmonary disease, unspecified: Secondary | ICD-10-CM | POA: Insufficient documentation

## 2015-07-29 LAB — BASIC METABOLIC PANEL
BUN: 13 mg/dL (ref 6–23)
CALCIUM: 9.2 mg/dL (ref 8.4–10.5)
CO2: 27 meq/L (ref 19–32)
CREATININE: 0.75 mg/dL (ref 0.40–1.20)
Chloride: 105 mEq/L (ref 96–112)
GFR: 80.34 mL/min (ref >60.00)
Glucose, Bld: 86 mg/dL (ref 70–99)
Potassium: 3.7 mEq/L (ref 3.5–5.1)
Sodium: 140 mEq/L (ref 135–145)

## 2015-07-29 LAB — BRAIN NATRIURETIC PEPTIDE: PRO B NATRI PEPTIDE: 119 pg/mL — AB (ref 0.0–100.0)

## 2015-07-29 LAB — CBC WITH DIFFERENTIAL/PLATELET
BASOS PCT: 0.3 % (ref 0.0–3.0)
Basophils Absolute: 0 10*3/uL (ref 0.0–0.1)
EOS ABS: 0.2 10*3/uL (ref 0.0–0.7)
Eosinophils Relative: 3.2 % (ref 0.0–5.0)
HCT: 40.2 % (ref 36.0–46.0)
HEMOGLOBIN: 13.6 g/dL (ref 12.0–15.0)
Lymphocytes Relative: 17.8 % (ref 12.0–46.0)
Lymphs Abs: 1.1 10*3/uL (ref 0.7–4.0)
MCHC: 33.8 g/dL (ref 30.0–36.0)
MCV: 87.9 fl (ref 78.0–100.0)
MONO ABS: 0.6 10*3/uL (ref 0.1–1.0)
Monocytes Relative: 10.1 % (ref 3.0–12.0)
Neutro Abs: 4.3 10*3/uL (ref 1.4–7.7)
Neutrophils Relative %: 68.6 % (ref 43.0–77.0)
Platelets: 174 10*3/uL (ref 150.0–400.0)
RBC: 4.57 Mil/uL (ref 3.87–5.11)
RDW: 14.4 % (ref 11.5–15.5)
WBC: 6.2 10*3/uL (ref 4.0–10.5)

## 2015-07-29 LAB — TSH: TSH: 0.96 u[IU]/mL (ref 0.35–4.50)

## 2015-07-29 NOTE — Patient Instructions (Addendum)
Continue Symbicort 160 Take 2 puffs first thing in am and then another 2 puffs about 12 hours later.   Only use your albuterol as a rescue medication to be used if you can't catch your breath by resting or doing a relaxed purse lip breathing pattern.  - The less you use it, the better it will work when you need it. - Ok to use up to 2 puffs  every 4 hours if you must but call for immediate appointment if use goes up over your usual need - Don't leave home without it !!  (think of it like the spare tire for your car)   Please remember to go to the lab and x-ray department downstairs for your tests - we will call you with the results when they are available.  Keep appt to see Dr Lake Bells as you plan

## 2015-07-29 NOTE — Telephone Encounter (Signed)
Last office visit 07/14/2015 with [name].  Last refilled 07/19/2015 for #45 with no refills.  Ok to refill?

## 2015-07-29 NOTE — Progress Notes (Signed)
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 Acute Visit    Pt c/o increased SOB for the past month. She gets SOB walking approx 50 ft.   indolent onset min progressive doe x 2 month s excess/ purulent sputum or mucus plugs  Or cp to point can only walk 50 ft no better on symbicort though hfa quite poor (see a/p)  No obvious day to day or daytime variability or assoc chronic cough or cp or chest tightness, subjective wheeze or overt sinus or hb symptoms. No unusual exp hx or h/o childhood pna/ asthma or knowledge of premature birth.  Sleeping ok without nocturnal  or early am exacerbation  of respiratory  c/o's or need for noct saba. Also denies any obvious fluctuation of symptoms with weather or environmental changes or other aggravating or alleviating factors except as outlined above   Current Medications, Allergies, Complete Past Medical History, Past Surgical History, Family History, and Social History were reviewed in [employer] record.  ROS  The following are not active complaints unless bolded sore throat, dysphagia, dental problems, itching, sneezing,  nasal congestion or excess/ purulent secretions, ear ache,   fever, chills, sweats, unintended wt loss, classically pleuritic or exertional cp, hemoptysis,  orthopnea pnd or leg swelling, presyncope, palpitations, abdominal pain, anorexia, nausea, vomiting, diarrhea  or change in bowel or bladder habits, change in stools or urine, dysuria,hematuria,  rash, arthralgias, visual complaints, headache, numbness, weakness or ataxia or  problems with walking or coordination,  change in mood/affect or memory.        Review of Systems     Objective:   Physical Exam    amb wf nad but extremely evasive with questions re symptoms/ meds   Wt Readings from Last 3 Encounters:  07/29/15 189 lb (85.73 kg)  07/14/15 186 lb (84.369 kg)  06/23/15 184 lb 8 oz (83.689 kg)    Vital signs reviewed   HEENT: nl dentition, turbinates, and oropharynx. Nl external ear canals without cough reflex   NECK :  without JVD/Nodes/TM/ nl carotid upstrokes bilaterally   LUNGS: no acc muscle use,  Nl contour chest which is clear to A and P bilaterally without cough on insp or exp maneuvers   CV:  RRR  no s3 or murmur or increase in P2, no edema   ABD:  soft and nontender with nl inspiratory excursion in the supine position. No bruits or organomegaly, bowel sounds nl  MS:  Nl gait/ ext warm without deformities, calf tenderness, cyanosis or clubbing No obvious joint restrictions   SKIN: warm and dry without lesions    NEURO:  alert, approp, nl sensorium with  no motor deficits     CXR PA and Lateral:   07/29/2015 :    I personally reviewed images and agree with radiology impression as follows:    Progressive interstitial prominence without definite edema, suggesting progressive interstitial lung disease. Consider follow-up high-resolution chest CT.    Labs ordered/ reviewed:  Chemistry      Component Value Date/Time   NA 140 07/29/2015 1703   K 3.7 07/29/2015 1703   CL 105 07/29/2015 1703   CO2 27 07/29/2015 1703   BUN 13 07/29/2015 1703   CREATININE 0.75 07/29/2015 1703      Component Value Date/Time   CALCIUM 9.2 07/29/2015 1703   ALKPHOS 89 11/19/2014 1008   AST 15 11/19/2014 1008   ALT 13 11/19/2014 1008   BILITOT 0.5 11/19/2014 1008        Lab Results  Component Value Date   WBC 6.2 07/29/2015   HGB 13.6 07/29/2015   HCT 40.2 07/29/2015   MCV 87.9 07/29/2015   PLT 174.0 07/29/2015         Lab  Results  Component Value Date   TSH 0.96 07/29/2015     Lab Results  Component Value Date   PROBNP 119.0* 07/29/2015           Assessment:

## 2015-07-29 NOTE — Telephone Encounter (Signed)
Pt request refill of ventolin inhaler with different instructions; pt said she has to use ventolin inhaler 2 puffs q2h. Pt was seen 07/14/15 for COPD and SOB;pt said she has scheduled appt 07/29/15 at 3:45 with Dr Melvyn Novas and she will talk with Dr Melvyn Novas about ventolin inhaler instructions. Nothing further needed at this time.

## 2015-07-30 NOTE — Progress Notes (Signed)
Quick Note:  LMTCB ______ 

## 2015-07-31 NOTE — Progress Notes (Signed)
Quick Note:  Spoke with pt and notified of results per Dr. Wert. Pt verbalized understanding and denied any questions.  ______ 

## 2015-08-02 NOTE — Assessment & Plan Note (Addendum)
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  - The proper method of use, as well as anticipated side effects, of a metered-dose inhaler are discussed and demonstrated to the patient. Improved effectiveness after extensive coaching during this visit to a level of approximately 75 % from a baseline of 25 % so worth trying symbicort 160 2bid for now and return to complete the w/u with Dr Lake Bells    I had an extended discussion with the patient reviewing all relevant studies completed to date and  lasting 25  minutes of a 40 minute acute extended visit    Each maintenance medication was reviewed in detail including most importantly the difference between maintenance and prns and under what circumstances the prns are to be triggered using an action plan format that is not reflected in the computer generated alphabetically organized AVS.    Please see instructions for details which were reviewed in writing and the patient given a copy highlighting the part that I personally wrote and discussed at today's ov.

## 2015-08-03 NOTE — Progress Notes (Signed)
Quick Note:  lmtcb ______ 

## 2015-08-05 NOTE — Progress Notes (Signed)
Quick Note:  Called and spoke with pt. Reviewed results and recs. Pt voiced understanding and had no further questions. ______ 

## 2015-08-10 ENCOUNTER — Telehealth: Payer: Self-pay

## 2015-08-10 ENCOUNTER — Ambulatory Visit: Payer: Medicare Other | Admitting: Podiatry

## 2015-08-10 NOTE — Telephone Encounter (Signed)
Pt left v/m requesting cb about refills for inhaler; pulmonologist told pt she had asthma and pt request cb. Left v/m requesting cb.

## 2015-09-07 ENCOUNTER — Encounter: Payer: Self-pay | Admitting: Pulmonary Disease

## 2015-09-07 ENCOUNTER — Ambulatory Visit (INDEPENDENT_AMBULATORY_CARE_PROVIDER_SITE_OTHER): Payer: Medicare Other | Admitting: Pulmonary Disease

## 2015-09-07 VITALS — BP 134/76 | HR 71 | Ht 60.0 in | Wt 193.0 lb

## 2015-09-07 DIAGNOSIS — J849 Interstitial pulmonary disease, unspecified: Secondary | ICD-10-CM | POA: Diagnosis not present

## 2015-09-07 DIAGNOSIS — J449 Chronic obstructive pulmonary disease, unspecified: Secondary | ICD-10-CM

## 2015-09-07 DIAGNOSIS — I499 Cardiac arrhythmia, unspecified: Secondary | ICD-10-CM | POA: Diagnosis not present

## 2015-09-07 DIAGNOSIS — R0600 Dyspnea, unspecified: Secondary | ICD-10-CM | POA: Diagnosis not present

## 2015-09-07 NOTE — Progress Notes (Signed)
Subjective:    Patient ID: Susan [name], female    DOB: [birth year], 75 y.o.   MRN: [id number]  Synopsis: Susan [name] first saw the [facility] pulmonary clinic in September 2014 for shortness of breath. She had a previous heavy smoking history but he quit by the time she saw me. Lung function testing was actually not consistent with obstruction but she did have emphysema on her chest CT. Spiriva was started empirically for that. She also had a home sleep study which showed an apnea hypopnea index of 44. A follow-up split-night study showed an AHI of only 18, CPAP was titrated to 10 cm of water but she was only able to wear it for approximately 15 minutes. She has had difficulty using CPAP when prescribed in the past.  HPI  Chief Complaint  Patient presents with  . Follow-up    Pt c/o shortness of breath with exertion.  denies chest tightness, sinus congestion, mucus production.     Susan [name] has been having more problems breathing for the last several months.  No cough.  NO chest pain.  NO sinus symptoms. No improvement with the Symbicort, she doesn't think it has really helped much. No flu like symptoms, no pneumonia diagnosis. 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    Past Medical History  Diagnosis Date  . Diverticulosis of colon (without mention of hemorrhage)   . 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 Acute peptic ulcer, unspecified site, with hemorrhage and perforation, without mention of obstruction   . Chronic airway obstruction, not elsewhere classified   . Ventral hernia   . Hematochezia   . Arthritis      Review of Systems  Constitutional: Negative for fever, chills and fatigue.  HENT: Negative for congestion, nosebleeds, postnasal drip and rhinorrhea.   Respiratory: Negative for cough, shortness of breath and wheezing.   Cardiovascular: Negative for chest pain, palpitations and leg swelling.        Objective:   Physical Exam  Filed Vitals:   09/07/15 1637  BP: 134/76  Pulse: 71  Height: 5' (1.524 m)  Weight: 193 lb (87.544 kg)  SpO2: 95%  RA  Gen: comfortable HEENT: NCAT, EOMi PULM: Few crackles left base  CV: RRR, no mgr AB: BS+, soft, nontender Ext: warm, no edema  June 2014 full pulmonary function test at [facility] ratio 80%, FEV1 1.74 L (101% predicted) total lung capacity is 3.81 L (91% predicted), DLCO 11.0 (56% predicted) May 2014 chest x-ray no acute cardiopulmonary abnormality, questionable hyperinflation 01/2013 CT chest 68mm sub pleural nodule, mild emphysema 01/2013 CT chest 64mm sub pleural nodule, mild emphysema 04/22/2013 CT chest > 52mm nodule unchanged 08/2014 CT chest > nodule unchanged, progressive interstitial infilatrate bilateral lower lobes of uncertain etiology  Notes from Dr. Gustavus Bryant visit reviewed     Assessment & Plan:   Dyspnea She has been experiencing progressive dyspnea for the last year despite having no evidence of airflow obstruction and failure to respond to airways disease treatment. She does have emphysema but a recent chest x-ray raises the possibility of interstitial lung disease. Her exam is consistent with this as well.  Also, she had an irregular heartbeat today, she's never been told that she had atrial fibrillation in the past.  Plan: EKG Pulmonary function tests High-resolution CT scan Follow-up 4 weeks    Updated Medication List Outpatient Encounter Prescriptions as of 09/07/2015  Medication Sig  . acyclovir (ZOVIRAX) 400 MG tablet TAKE ONE TABLET TWICE A DAY (Patient taking differently: TAKE ONE TABLET TWICE A DAY as needed)  . albuterol (PROVENTIL HFA;VENTOLIN HFA) 108 (90 Base) MCG/ACT inhaler Inhale 2 puffs into the lungs every 6 (six) hours as needed for wheezing or shortness of breath.  . budesonide-formoterol (SYMBICORT) 160-4.5 MCG/ACT inhaler Inhale 2 puffs into the lungs 2 (two)  times daily.  Marland Kitchen ibuprofen (ADVIL,MOTRIN) 200 MG tablet Take 600 mg by mouth daily.  Marland Kitchen MILK THISTLE PO Take 1 tablet by mouth daily.  . Multiple Vitamin (MULTIVITAMIN WITH MINERALS) TABS tablet Take 1 tablet by mouth daily.  . Probiotic Product (PROBIOTIC PO) Take 1 tablet by mouth daily.  Marland Kitchen rOPINIRole (REQUIP) 3 MG tablet TAKE ONE TABLET AT BEDTIME  . traZODone (DESYREL) 50 MG tablet TAKE ONE AND ONE-HALF TABLETS AT BEDTIME. AS NEEDED FOR SLEEP  . TURMERIC PO Take 1 tablet by mouth daily.   Facility-Administered Encounter Medications as of 09/07/2015  Medication  . dexamethasone (DECADRON) injection 4 mg  . ondansetron (ZOFRAN) 4 mg in sodium chloride 0.9 % 50 mL IVPB  . ondansetron (ZOFRAN) 4 mg in sodium chloride 0.9 % 50 mL IVPB

## 2015-09-07 NOTE — Patient Instructions (Signed)
We will order a pulmonary function test and high-resolution CT scanning call you with those results We will see you back in 4 weeks or sooner if needed

## 2015-09-07 NOTE — Assessment & Plan Note (Signed)
She has been experiencing progressive dyspnea for the last year despite having no evidence of airflow obstruction and failure to respond to airways disease treatment. She does have emphysema but a recent chest x-ray raises the possibility of interstitial lung disease. Her exam is consistent with this as well.  Also, she had an irregular heartbeat today, she's never been told that she had atrial fibrillation in the past.  Plan: EKG Pulmonary function tests High-resolution CT scan Follow-up 4 weeks

## 2015-09-10 NOTE — Telephone Encounter (Signed)
Pt saw pulmonologist 09/07/15.

## 2015-09-15 ENCOUNTER — Ambulatory Visit (INDEPENDENT_AMBULATORY_CARE_PROVIDER_SITE_OTHER): Payer: Medicare Other | Admitting: *Deleted

## 2015-09-15 DIAGNOSIS — J449 Chronic obstructive pulmonary disease, unspecified: Secondary | ICD-10-CM

## 2015-09-15 LAB — PULMONARY FUNCTION TEST
DL/VA % pred: 66 %
DL/VA: 2.8 ml/min/mmHg/L
DLCO UNC % PRED: 148 %
DLCO UNC: 27.93 ml/min/mmHg
FEF 25-75 PRE: 2.31 L/s
FEF 25-75 Post: 2.07 L/sec
FEF2575-%Change-Post: -10 %
FEF2575-%Pred-Post: 139 %
FEF2575-%Pred-Pre: 154 %
FEV1-%CHANGE-POST: -2 %
FEV1-%PRED-POST: 98 %
FEV1-%PRED-PRE: 101 %
FEV1-POST: 1.75 L
FEV1-Pre: 1.8 L
FEV1FVC-%Change-Post: 1 %
FEV1FVC-%Pred-Pre: 113 %
FEV6-%Change-Post: -3 %
FEV6-%PRED-POST: 90 %
FEV6-%PRED-PRE: 93 %
FEV6-POST: 2.04 L
FEV6-Pre: 2.12 L
FEV6FVC-%PRED-POST: 105 %
FEV6FVC-%PRED-PRE: 105 %
FVC-%Change-Post: -3 %
FVC-%Pred-Post: 85 %
FVC-%Pred-Pre: 89 %
FVC-PRE: 2.12 L
FVC-Post: 2.04 L
PRE FEV6/FVC RATIO: 100 %
Post FEV1/FVC ratio: 86 %
Post FEV6/FVC ratio: 100 %
Pre FEV1/FVC ratio: 85 %
RV % PRED: 275 %
RV: 5.7 L
TLC % pred: 180 %
TLC: 8.01 L

## 2015-09-15 NOTE — Progress Notes (Signed)
PFT performed today. 

## 2015-09-16 ENCOUNTER — Ambulatory Visit
Admission: RE | Admit: 2015-09-16 | Discharge: 2015-09-16 | Disposition: A | Discharge status: Home / Self Care | Payer: Medicare Other | Source: Ambulatory Visit | Attending: Pulmonary Disease | Admitting: Pulmonary Disease

## 2015-09-16 DIAGNOSIS — I2510 Atherosclerotic heart disease of native coronary artery without angina pectoris: Secondary | ICD-10-CM | POA: Insufficient documentation

## 2015-09-16 DIAGNOSIS — J439 Emphysema, unspecified: Secondary | ICD-10-CM | POA: Insufficient documentation

## 2015-09-16 DIAGNOSIS — R918 Other nonspecific abnormal finding of lung field: Secondary | ICD-10-CM | POA: Insufficient documentation

## 2015-09-16 DIAGNOSIS — R911 Solitary pulmonary nodule: Secondary | ICD-10-CM | POA: Insufficient documentation

## 2015-09-16 DIAGNOSIS — R0600 Dyspnea, unspecified: Secondary | ICD-10-CM

## 2015-09-28 ENCOUNTER — Other Ambulatory Visit: Payer: Self-pay | Admitting: Family Medicine

## 2015-09-28 NOTE — Telephone Encounter (Signed)
Last office visit 07/14/2015 with [name].  Tramadol not on current medication list.  Ok to refill?

## 2015-09-29 NOTE — Telephone Encounter (Signed)
Tramadol called into [facility] [address] [place]. 

## 2015-09-30 ENCOUNTER — Other Ambulatory Visit: Payer: Self-pay

## 2015-09-30 DIAGNOSIS — J449 Chronic obstructive pulmonary disease, unspecified: Secondary | ICD-10-CM

## 2015-10-02 ENCOUNTER — Other Ambulatory Visit: Payer: Self-pay

## 2015-10-02 ENCOUNTER — Emergency Department: Payer: Medicare Other

## 2015-10-02 ENCOUNTER — Encounter: Payer: Self-pay | Admitting: Emergency Medicine

## 2015-10-02 ENCOUNTER — Emergency Department
Admission: EM | Admit: 2015-10-02 | Discharge: 2015-10-02 | Disposition: A | Discharge status: Home / Self Care | Payer: Medicare Other | Attending: Emergency Medicine | Admitting: Emergency Medicine

## 2015-10-02 DIAGNOSIS — R55 Syncope and collapse: Secondary | ICD-10-CM | POA: Diagnosis present

## 2015-10-02 DIAGNOSIS — J45909 Unspecified asthma, uncomplicated: Secondary | ICD-10-CM | POA: Diagnosis not present

## 2015-10-02 DIAGNOSIS — M1990 Unspecified osteoarthritis, unspecified site: Secondary | ICD-10-CM | POA: Diagnosis not present

## 2015-10-02 DIAGNOSIS — J441 Chronic obstructive pulmonary disease with (acute) exacerbation: Secondary | ICD-10-CM | POA: Diagnosis not present

## 2015-10-02 DIAGNOSIS — M519 Unspecified thoracic, thoracolumbar and lumbosacral intervertebral disc disorder: Secondary | ICD-10-CM | POA: Insufficient documentation

## 2015-10-02 DIAGNOSIS — Z87891 Personal history of nicotine dependence: Secondary | ICD-10-CM | POA: Diagnosis not present

## 2015-10-02 DIAGNOSIS — Z791 Long term (current) use of non-steroidal anti-inflammatories (NSAID): Secondary | ICD-10-CM | POA: Diagnosis not present

## 2015-10-02 DIAGNOSIS — Z79899 Other long term (current) drug therapy: Secondary | ICD-10-CM | POA: Insufficient documentation

## 2015-10-02 HISTORY — DX: Unspecified asthma, uncomplicated: J45.909

## 2015-10-02 LAB — CBC
HCT: 40.8 % (ref 35.0–47.0)
Hemoglobin: 13.5 g/dL (ref 12.0–16.0)
MCH: 29.6 pg (ref 26.0–34.0)
MCHC: 33 g/dL (ref 32.0–36.0)
MCV: 89.6 fL (ref 80.0–100.0)
PLATELETS: 148 10*3/uL — AB (ref 150–440)
RBC: 4.55 MIL/uL (ref 3.80–5.20)
RDW: 14.7 % — ABNORMAL HIGH (ref 11.5–14.5)
WBC: 7.2 10*3/uL (ref 3.6–11.0)

## 2015-10-02 LAB — COMPREHENSIVE METABOLIC PANEL
ALK PHOS: 75 U/L (ref 38–126)
ALT: 23 U/L (ref 14–54)
ANION GAP: 9 (ref 5–15)
AST: 22 U/L (ref 15–41)
Albumin: 4.2 g/dL (ref 3.5–5.0)
BUN: 17 mg/dL (ref 6–20)
CALCIUM: 8.6 mg/dL — AB (ref 8.9–10.3)
CHLORIDE: 108 mmol/L (ref 101–111)
CO2: 21 mmol/L — ABNORMAL LOW (ref 22–32)
CREATININE: 0.73 mg/dL (ref 0.44–1.00)
Glucose, Bld: 99 mg/dL (ref 65–99)
Potassium: 3.4 mmol/L — ABNORMAL LOW (ref 3.5–5.1)
Sodium: 138 mmol/L (ref 135–145)
Total Bilirubin: 0.9 mg/dL (ref 0.3–1.2)
Total Protein: 7.1 g/dL (ref 6.5–8.1)

## 2015-10-02 LAB — TROPONIN I

## 2015-10-02 NOTE — ED Provider Notes (Signed)
[facility] Emergency Department Provider Note  ____________________________________________    I have reviewed the triage vital signs and the nursing notes.   HISTORY  Chief Complaint Loss of Consciousness    HPI Susan [name] is a 75 y.o. female who presents after a near syncopal episode. Patient was at work, she is a Theme park manager. She reports she had been standing for some time and started to feel lightheaded and like she needs to sit down. She did sit down and did not fall. Coworkers noted that she seemed briefly unresponsive. No seizure-like activity. Patient denies chest pain or palpitations. No recent travel. No history of blood clots. No shortness of breath or pleurisy     Past Medical History  Diagnosis Date  . Diverticulosis of colon (without mention of hemorrhage)   . 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 Acute peptic ulcer, unspecified site, with hemorrhage and perforation, without mention of obstruction   . Chronic airway obstruction, not elsewhere classified   . Ventral hernia   . Hematochezia   . Arthritis   . Asthma     Patient Active Problem List   Diagnosis Date Noted  . Dyspnea 07/29/2015  . UTI (urinary tract infection) 06/23/2015  . Dizziness and giddiness 01/02/2015  . Nasal congestion due to prolonged use of decongestants 12/09/2014  . Chronic insomnia 12/09/2014  . Primary osteoarthritis of left knee 10/03/2014  . Primary osteoarthritis of right knee 08/08/2014  . Elevated blood-pressure reading without diagnosis of hypertension 07/31/2014  . Osteoarthritis of right knee, Severe 10/28/2013  . COPD exacerbation ([place]) 10/22/2013  . Glenohumeral arthritis, Severe 08/20/2013  . Fracture of superior pubic ramus ([place]) 04/23/2013  . 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 Solitary pulmonary nodule 02/19/2013  . Chronic knee pain 01/15/2013  . Obstructive sleep apnea 10/11/2012  . Umbilical hernia [id number]  . Urinary  incontinence, urge 01/17/2012  . Diastolic dysfunction [id number]  . Preoperative cardiovascular examination 04/21/2011  . DDD (degenerative disc disease) 10/26/2010  . RESTLESS LEG SYNDROME 02/23/2010  . HIP REPLACEMENT, LEFT, HX OF 12/21/2009  . FOOT PAIN, BILATERAL 03/25/2009  . HYPERCHOLESTEROLEMIA 06/18/2008  . ALLERGIC RHINITIS 05/21/2008  . ANKLE PAIN, BILATERAL 05/21/2008  . DIVERTICULOSIS, COLON 11/26/2007  . HERPES SIMPLEX INFECTION, RECURRENT 10/17/2007    Past Surgical History  Procedure Laterality Date  . Bunionectomy Bilateral   . Total hip arthroplasty  2007    left  . Appendectomy    . Tubal ligation    . Cosmetic surgery  1970's    abdomen. Tummy Tuck  . Foot surgery Bilateral dec 2015 right     left 3 yrs ago  . Total hip arthroplasty      bilateral   . 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 Total knee arthroplasty Left 10/03/2014    Procedure: LEFT TOTAL KNEE ARTHROPLASTY;  Surgeon: Rod Can, MD;  Location: [facility];  Service: Orthopedics;  Laterality: Left;    Current Outpatient Rx  Name  Route  Sig  Dispense  Refill  . acyclovir (ZOVIRAX) 400 MG tablet      TAKE ONE TABLET TWICE A DAY Patient taking differently: TAKE ONE TABLET TWICE A DAY as needed   60 tablet   3   . albuterol (PROVENTIL HFA;VENTOLIN HFA) 108 (90 Base) MCG/ACT inhaler   Inhalation   Inhale  2 puffs into the lungs every 6 (six) hours as needed for wheezing or shortness of breath.   1 Inhaler   5   . budesonide-formoterol (SYMBICORT) 160-4.5 MCG/ACT inhaler   Inhalation   Inhale 2 puffs into the lungs 2 (two) times daily.   1 Inhaler   3   . ibuprofen (ADVIL,MOTRIN) 200 MG tablet   Oral   Take 600 mg by mouth daily.         Marland Kitchen MILK THISTLE PO   Oral   Take 1 tablet by mouth daily.         . Multiple Vitamin  (MULTIVITAMIN WITH MINERALS) TABS tablet   Oral   Take 1 tablet by mouth daily.         . Probiotic Product (PROBIOTIC PO)   Oral   Take 1 tablet by mouth daily.         Marland Kitchen rOPINIRole (REQUIP) 3 MG tablet      TAKE ONE TABLET AT BEDTIME   30 tablet   11   . traMADol (ULTRAM) 50 MG tablet      TAKE 1 OR 2 TABLETS EVERY 6 HOURS AS NEEDED FOR PAIN   60 tablet   1     Rx for controlled drug has expired - unused refill ...   . traZODone (DESYREL) 50 MG tablet      TAKE ONE AND ONE-HALF TABLETS AT BEDTIME. AS NEEDED FOR SLEEP   45 tablet   2     No refills available   . TURMERIC PO   Oral   Take 1 tablet by mouth daily.           Allergies Review of patient's allergies indicates no known allergies.  Family History  Problem Relation Age of Onset  . Diabetes Mother   . Coronary artery disease Mother   . Lung disease Mother   . Heart disease Mother     CAD  . Coronary artery disease Father   . Kidney disease Father   . Diabetes Father   . Heart disease Father     CAD  . Cancer Neg Hx     Social History Social History  Substance Use Topics  . Smoking status: Former Smoker -- 0.50 packs/day for 40 years    Types: Cigarettes    Quit date: 08/29/2000  . Smokeless tobacco: Never Used  . Alcohol Use: 0.0 oz/week    0 Standard drinks or equivalent per week     Comment: rare    Review of Systems  Constitutional: Negative for fever. Eyes: Negative for redness ENT: Negative for sore throat Cardiovascular: Negative for chest pain Respiratory: Negative for shortness of breath. Gastrointestinal: Negative for abdominal pain Genitourinary: Negative for dysuria. Musculoskeletal: Negative for back pain. Skin: Negative for rash. Neurological: Negative for focal weakness Psychiatric: no anxiety    ____________________________________________   PHYSICAL EXAM:  VITAL SIGNS: ED Triage Vitals  Enc Vitals Group     BP 10/02/15 1853 169/90 mmHg     Pulse  Rate 10/02/15 1853 74     Resp 10/02/15 1853 22     Temp --      Temp src --      SpO2 10/02/15 1853 93 %     Weight 10/02/15 1853 189 lb (85.73 kg)     Height 10/02/15 1853 5' (1.524 m)     Head Cir --      Peak Flow --      Pain Score --  Pain Loc --      Pain Edu? --      Excl. in [place]? --      Constitutional: Alert and oriented. Well appearing and in no distress. Pleasant and interactive Eyes: Conjunctivae are normal. No erythema or injection ENT   Head: Normocephalic and atraumatic.   Mouth/Throat: Mucous membranes are moist. Cardiovascular: Normal rate, regular rhythm. Normal and symmetric distal pulses are present in the upper extremities.  Respiratory: Normal respiratory effort without tachypnea nor retractions.  Gastrointestinal: Soft and non-tender in all quadrants. No distention. There is no CVA tenderness. Genitourinary: deferred Musculoskeletal: Nontender with normal range of motion in all extremities. No lower extremity tenderness nor edema. Neurologic:  Normal speech and language. No gross focal neurologic deficits are appreciated. Skin:  Skin is warm, dry and intact. No rash noted. Psychiatric: Mood and affect are normal. Patient exhibits appropriate insight and judgment.  ____________________________________________    LABS (pertinent positives/negatives)  Labs Reviewed  CBC - Abnormal; Notable for the following:    RDW 14.7 (*)    Platelets 148 (*)    All other components within normal limits  COMPREHENSIVE METABOLIC PANEL - Abnormal; Notable for the following:    Potassium 3.4 (*)    CO2 21 (*)    Calcium 8.6 (*)    All other components within normal limits  TROPONIN I    ____________________________________________   EKG  ED ECG REPORT I, Lavonia Drafts, the attending physician, personally viewed and interpreted this ECG.  Date: 10/02/2015 EKG Time: 6:51 PM Rate: 71 Rhythm: normal sinus rhythm QRS Axis: normal Intervals:  normal ST/T Wave abnormalities: normal Conduction Disturbances: none Narrative Interpretation: unremarkable  ____________________________________________    RADIOLOGY  Chronic lung changes on x-ray  ____________________________________________   PROCEDURES  Procedure(s) performed: none  Critical Care performed: none  ____________________________________________   INITIAL IMPRESSION / ASSESSMENT AND PLAN / ED COURSE  Pertinent labs & imaging results that were available during my care of the patient were reviewed by me and considered in my medical decision making (see chart for details).  Patient presents after a near syncopal episode. She is well-appearing and in no distress at this time. We will check labs, EKG and reevaluate.  Lab work is reassuring. Orthostatics are normal. Patient feels very well and is anxious to go home. We discussed return precautions and the need for follow-up. She agrees with this.  ____________________________________________   FINAL CLINICAL IMPRESSION(S) / ED DIAGNOSES  Final diagnoses:  Near syncope          Lavonia Drafts, MD 10/02/15 2309

## 2015-10-02 NOTE — ED Notes (Signed)
Pt arrived by EMS after a syncopal episode at home witnessed by family. Family told EMS that the pt became "hot" and then had LOC. Pt denies any chest pain. Pt states only hx is asthma which she uses an inhaler.

## 2015-10-02 NOTE — Discharge Instructions (Signed)

## 2015-10-05 ENCOUNTER — Ambulatory Visit: Payer: Medicare Other | Admitting: Pulmonary Disease

## 2015-10-05 DIAGNOSIS — J449 Chronic obstructive pulmonary disease, unspecified: Secondary | ICD-10-CM

## 2015-10-05 LAB — PULMONARY FUNCTION TEST
DL/VA % PRED: 74 %
DL/VA: 3.14 ml/min/mmHg/L
DLCO COR: 11.5 ml/min/mmHg
DLCO UNC % PRED: 64 %
DLCO UNC: 12.12 ml/min/mmHg
DLCO cor % pred: 60 %
FEF 25-75 PRE: 2.08 L/s
FEF 25-75 Post: 2.01 L/sec
FEF2575-%Change-Post: -3 %
FEF2575-%PRED-PRE: 139 %
FEF2575-%Pred-Post: 134 %
FEV1-%Change-Post: -2 %
FEV1-%PRED-POST: 94 %
FEV1-%PRED-PRE: 96 %
FEV1-PRE: 1.72 L
FEV1-Post: 1.68 L
FEV1FVC-%Change-Post: 1 %
FEV1FVC-%PRED-PRE: 112 %
FEV6-%CHANGE-POST: -3 %
FEV6-%PRED-PRE: 89 %
FEV6-%Pred-Post: 86 %
FEV6-POST: 1.96 L
FEV6-Pre: 2.02 L
FEV6FVC-%CHANGE-POST: 0 %
FEV6FVC-%PRED-POST: 105 %
FEV6FVC-%Pred-Pre: 104 %
FVC-%Change-Post: -3 %
FVC-%Pred-Post: 82 %
FVC-%Pred-Pre: 85 %
FVC-POST: 1.96 L
FVC-Pre: 2.03 L
POST FEV6/FVC RATIO: 100 %
PRE FEV1/FVC RATIO: 84 %
Post FEV1/FVC ratio: 86 %
Pre FEV6/FVC Ratio: 99 %
RV % pred: 89 %
RV: 1.85 L
TLC % PRED: 89 %
TLC: 3.98 L

## 2015-10-06 ENCOUNTER — Other Ambulatory Visit: Payer: Self-pay | Admitting: Family Medicine

## 2015-10-06 NOTE — Telephone Encounter (Signed)
Last office visit 07/14/2015 with [name].  Has Hospital follow up scheduled for 5/18.  Last CPE????  Ok to refill?

## 2015-10-08 ENCOUNTER — Encounter: Payer: Self-pay | Admitting: Family Medicine

## 2015-10-08 ENCOUNTER — Ambulatory Visit (INDEPENDENT_AMBULATORY_CARE_PROVIDER_SITE_OTHER): Payer: Medicare Other | Admitting: Family Medicine

## 2015-10-08 VITALS — BP 159/94 | HR 98 | Temp 97.7°F | Ht 60.0 in | Wt 191.0 lb

## 2015-10-08 DIAGNOSIS — H539 Unspecified visual disturbance: Secondary | ICD-10-CM

## 2015-10-08 DIAGNOSIS — R29818 Other symptoms and signs involving the nervous system: Secondary | ICD-10-CM | POA: Diagnosis not present

## 2015-10-08 DIAGNOSIS — R55 Syncope and collapse: Secondary | ICD-10-CM | POA: Diagnosis not present

## 2015-10-08 DIAGNOSIS — R030 Elevated blood-pressure reading, without diagnosis of hypertension: Secondary | ICD-10-CM | POA: Diagnosis not present

## 2015-10-08 DIAGNOSIS — R2689 Other abnormalities of gait and mobility: Secondary | ICD-10-CM

## 2015-10-08 NOTE — Assessment & Plan Note (Signed)
Not clearly vertigo. Intermittant, Eval with MRI brain for cerebellar CVA.

## 2015-10-08 NOTE — Assessment & Plan Note (Signed)
Eval with MRI brain as above but pt also needs full eye exam. Consider carotid dopplers, but no bruit today.

## 2015-10-08 NOTE — Progress Notes (Signed)
Pre visit review using our clinic review tool, if applicable. No additional management support is needed unless otherwise documented below in the visit note. 

## 2015-10-08 NOTE — Patient Instructions (Addendum)
Follow BP at home.. Call if consistently > 140/90. Drink more water, make sure to eat regualrly.. Do not go more than 5 hours without eating.  Return to eye for evaluatioin of right eye changes. Start baby aspirin daily. Stop at front desk for referral for MRI.

## 2015-10-08 NOTE — Progress Notes (Signed)
Subjective:    Patient ID: Susan [name], female    DOB: [birth year], 75 y.o.   MRN: [id number]  HPI   75 year old female presents for ER follow up for near syncopal event on 5/12. Patient was at work, she is a Theme park manager. She reports she had been standing for some time and started to feel lightheaded and like she needs to sit down. Per pt today she was responsive the whole time.  She did sit down and did not fall.  She was leaning towards right per witness and she states she felt like she was being forcibly pulled to the right.  She was under a lot of stress that day. No weakness, no numbness, no slurred speech. She had not eaten much that day, minimal intake of water that day.Marland Kitchen  She was then taken to the ED at [facility]   EKG NSR, no changes  no anemia, potassium and ca slightly low, nml sodium and glucose  neg troponin I  CXR: mild cardiomegaly, atherosclerosis, no acute changes.  Today she reports:  No issues except intermittent vision decrease in right eye as detailed below  No neuro change.   No swelling in ankles, no SOB.  No anxiety. No depression, no panic attacks. Not following BP at home.  BP Readings from Last 3 Encounters:  10/08/15 159/94  10/02/15 139/79  09/07/15 134/76   In past several months, she has had episode of feeling like she could not walk straight. Veered towards right ( see previous note) Lasted seconds to 30 seconds at a time.  Last eye exam a while ago.  Occ in past she feels like vision blurry or weaker for off and on in right eye, lasting 1-2 minutes. Now no vision changes in bilateral eyes.  Social History /Family History/Past Medical History reviewed and updated if needed.  Review of Systems  Constitutional: Negative for fever and fatigue.  HENT: Negative for ear pain.   Eyes: Negative for pain.  Respiratory: Negative for chest tightness and shortness of breath.   Cardiovascular: Negative for chest pain, palpitations and leg swelling.    Gastrointestinal: Negative for abdominal pain.  Genitourinary: Negative for dysuria.       Objective:   Physical Exam  Constitutional: She is oriented to person, place, and time. Vital signs are normal. She appears well-developed and well-nourished. She is cooperative.  Non-toxic appearance. She does not appear ill. No distress.  HENT:  Head: Normocephalic.  Right Ear: Hearing, tympanic membrane, external ear and ear canal normal. Tympanic membrane is not erythematous, not retracted and not bulging.  Left Ear: Hearing, tympanic membrane, external ear and ear canal normal. Tympanic membrane is not erythematous, not retracted and not bulging.  Nose: No mucosal edema or rhinorrhea. Right sinus exhibits no maxillary sinus tenderness and no frontal sinus tenderness. Left sinus exhibits no maxillary sinus tenderness and no frontal sinus tenderness.  Mouth/Throat: Uvula is midline, oropharynx is clear and moist and mucous membranes are normal.  Eyes: Conjunctivae, EOM and lids are normal. Pupils are equal, round, and reactive to light. Lids are everted and swept, no foreign bodies found.  Neck: Trachea normal and normal range of motion. Neck supple. Carotid bruit is not present. No thyroid mass and no thyromegaly present.  Cardiovascular: Normal rate, regular rhythm, S1 normal, S2 normal, normal heart sounds, intact distal pulses and normal pulses.  Exam reveals no gallop and no friction rub.   No murmur heard. Pulmonary/Chest: Effort normal and breath sounds  normal. No tachypnea. No respiratory distress. She has no decreased breath sounds. She has no wheezes. She has no rhonchi. She has no rales.  Abdominal: Soft. Normal appearance and bowel sounds are normal. There is no tenderness.  Neurological: She is alert and oriented to person, place, and time. She has normal strength and normal reflexes. No cranial nerve deficit or sensory deficit. She exhibits normal muscle tone. She displays a negative  Romberg sign. Coordination and gait normal. GCS eye subscore is 4. GCS verbal subscore is 5. GCS motor subscore is 6.  Nml cerebellar exam   No papilledema  Skin: Skin is warm, dry and intact. No rash noted.  Psychiatric: She has a normal mood and affect. Her speech is normal and behavior is normal. Judgment and thought content normal. Her mood appears not anxious. Cognition and memory are normal. Cognition and memory are not impaired. She does not exhibit a depressed mood. She exhibits normal recent memory and normal remote memory.          Assessment & Plan:

## 2015-10-08 NOTE — Assessment & Plan Note (Signed)
Nml neuro exam. No proceeding symptoms or cardiac symptoms.  EKG , CXR and labs neg at ER.  No clear sign of cardiac source. Most likely due to decreased po, water and increase in stress, pt had been on feet a long time. Given intermittent vision changes and balance issues...consider TIA as source of symptoms.  Start pt on baby aspirin.  Manage risk factors.. Due for re-eval of cholesterol, check BP control, nonsmoker, eval for DM. Eval with MRI brain noncontrast.  No sign of increased intracranial pressure.

## 2015-10-08 NOTE — Assessment & Plan Note (Signed)
BP elevated here today on no med. In past has been well controlled at home. Pt to follow BPs at home.

## 2015-10-12 ENCOUNTER — Ambulatory Visit (HOSPITAL_COMMUNITY): Admission: RE | Admit: 2015-10-12 | Payer: Medicare Other | Source: Ambulatory Visit

## 2015-10-14 ENCOUNTER — Telehealth: Payer: Self-pay

## 2015-10-14 NOTE — Telephone Encounter (Signed)
Dr Henrene Pastor,      Last colonoscopy in 2009 with DP/ FAIR prep was noted with Gol[facility].  Would you like a 2 day prep?                                                                                                                                                     Thank you,                                                                                                                                                                  Epiphany Seltzer/PV

## 2015-10-14 NOTE — Telephone Encounter (Signed)
Yes, thank you.

## 2015-10-16 ENCOUNTER — Ambulatory Visit: Payer: Medicare Other | Admitting: Pulmonary Disease

## 2015-10-20 ENCOUNTER — Ambulatory Visit (AMBULATORY_SURGERY_CENTER): Payer: Self-pay

## 2015-10-20 VITALS — Ht 60.0 in | Wt 192.2 lb

## 2015-10-20 DIAGNOSIS — Z1211 Encounter for screening for malignant neoplasm of colon: Secondary | ICD-10-CM

## 2015-10-20 MED ORDER — NA SULFATE-K SULFATE-MG SULF 17.5-3.13-1.6 GM/177ML PO SOLN: ORAL | Status: DC

## 2015-10-20 NOTE — Progress Notes (Signed)
Pt came into the office today for her pre-visit prior to her colonoscopy on 11/02/15.Pt states she in on a fruit diet pill, but does not know the name .She will bring the medication to her appointment and will hold it for 10 days  prior to the colonoscopy.

## 2015-10-22 ENCOUNTER — Encounter: Payer: Self-pay | Admitting: Internal Medicine

## 2015-10-24 ENCOUNTER — Other Ambulatory Visit: Payer: Self-pay | Admitting: Family Medicine

## 2015-10-24 NOTE — Telephone Encounter (Signed)
Last office visit 10/08/15. Last refilled 07/30/15 for #45 with 2 refills. Ok to refill?

## 2015-10-29 ENCOUNTER — Other Ambulatory Visit: Payer: Self-pay | Admitting: Internal Medicine

## 2015-11-02 ENCOUNTER — Ambulatory Visit: Payer: Medicare Other | Admitting: Family Medicine

## 2015-11-02 ENCOUNTER — Encounter: Payer: Medicare Other | Admitting: Internal Medicine

## 2015-11-30 ENCOUNTER — Ambulatory Visit: Payer: Medicare Other | Admitting: Family Medicine

## 2015-12-08 ENCOUNTER — Encounter: Payer: Self-pay | Admitting: Family Medicine

## 2015-12-08 ENCOUNTER — Ambulatory Visit (INDEPENDENT_AMBULATORY_CARE_PROVIDER_SITE_OTHER): Payer: Medicare Other | Admitting: Family Medicine

## 2015-12-08 VITALS — BP 151/76 | HR 79 | Temp 98.0°F | Ht 60.0 in | Wt 189.5 lb

## 2015-12-08 DIAGNOSIS — M19019 Primary osteoarthritis, unspecified shoulder: Secondary | ICD-10-CM

## 2015-12-08 MED ORDER — METHYLPREDNISOLONE ACETATE 40 MG/ML IJ SUSP
80.0000 mg | Freq: Once | INTRAMUSCULAR | Status: AC
  Administered 2015-12-08: 80 mg via INTRA_ARTICULAR

## 2015-12-08 NOTE — Progress Notes (Signed)
   Dr. Frederico Hamman T. Omario Ander, MD, [facility] [place], [address] Phone: [phone] Fax: [phone]  12/08/2015  Patient: Susan [name], MRN: [id number], DOB: 1940-09-20, 75 y.o.  Primary Physician:  Eliezer Lofts, MD   Chief Complaint  Patient presents with  . Shoulder Pain    Bilateral    Procedure only, known B GH OA  Intrarticular Shoulder Injection, R Verbal consent was obtained from the patient. Risks including infection explained and contrasted with benefits and alternatives. Patient prepped with Chloraprep and Ethyl Chloride used for anesthesia. An intraarticular shoulder injection was performed using the posterior approach. The patient tolerated the procedure well and had decreased pain post injection. No complications. Injection: 8 cc of Lidocaine 1% and 2 mL Depo-Medrol 40 mg. Needle: 22 gauge   Intrarticular Shoulder Injection, L Verbal consent was obtained from the patient. Risks including infection explained and contrasted with benefits and alternatives. Patient prepped with Chloraprep and Ethyl Chloride used for anesthesia. An intraarticular shoulder injection was performed using the posterior approach. The patient tolerated the procedure well and had decreased pain post injection. No complications. Injection: 8 cc of Lidocaine 1% and 2 mL Depo-Medrol 40 mg. Needle: 22 gauge   Glenohumeral arthritis, unspecified laterality  Signed,  Hardy Harcum T. Gena Laski, MD   Patient's Medications  New Prescriptions   No medications on file  Previous Medications   ACYCLOVIR (ZOVIRAX) 400 MG TABLET    TAKE ONE TABLET TWICE A DAY   ALBUTEROL (PROVENTIL HFA;VENTOLIN HFA) 108 (90 BASE) MCG/ACT INHALER    Inhale 2 puffs into the lungs every 6 (six) hours as needed for wheezing or shortness of breath.   ASPIRIN 81 MG TABLET    Take 81 mg by mouth daily.   IBUPROFEN (ADVIL,MOTRIN) 200 MG TABLET    Take 600 mg by mouth every 6  (six) hours as needed.    MULTIPLE VITAMIN (MULTIVITAMIN WITH MINERALS) TABS TABLET    Take 1 tablet by mouth daily.   NA SULFATE-K SULFATE-MG SULF (SUPREP BOWEL PREP KIT) 17.5-3.13-1.6 GM/180ML SOLN    Suprep as directed / no substitutions   PROBIOTIC PRODUCT (PROBIOTIC PO)    Take 1 tablet by mouth daily.   ROPINIROLE (REQUIP) 3 MG TABLET    TAKE 1 TABLET EVERY NIGHT AT BEDTIME   SYMBICORT 160-4.5 MCG/ACT INHALER    INHALE 2 PUFFS INTO THE LUNGS 2 (TWO) TIMES DAILY.   TRAMADOL (ULTRAM) 50 MG TABLET    TAKE 1 OR 2 TABLETS EVERY 6 HOURS AS NEEDED FOR PAIN   TRAZODONE (DESYREL) 50 MG TABLET    TAKE ONE AND ONE-HALF TABLETS AT BEDTIME. AS NEEDED FOR SLEEP  Modified Medications   No medications on file  Discontinued Medications   TRAZODONE (DESYREL) 50 MG TABLET    TAKE ONE AND ONE-HALF TABLETS AT BEDTIME. AS NEEDED FOR SLEEP

## 2015-12-08 NOTE — Addendum Note (Signed)
Addended by: Carter Kitten on: 12/08/2015 10:13 AM   Modules accepted: Orders, SmartSet

## 2015-12-08 NOTE — Progress Notes (Signed)
Pre visit review using our clinic review tool, if applicable. No additional management support is needed unless otherwise documented below in the visit note. 

## 2015-12-15 ENCOUNTER — Telehealth: Payer: Self-pay | Admitting: Family Medicine

## 2015-12-15 DIAGNOSIS — E7800 Pure hypercholesterolemia, unspecified: Secondary | ICD-10-CM

## 2015-12-15 NOTE — Telephone Encounter (Signed)
-----   Message from Ellamae Sia sent at 12/09/2015  2:48 PM EDT ----- Regarding: Lab orders for Wednesday, 7.26.17 Patient is scheduled for CPX labs, please order future labs, Thanks , Karna Christmas

## 2015-12-16 ENCOUNTER — Other Ambulatory Visit: Payer: Medicare Other

## 2015-12-21 ENCOUNTER — Encounter: Payer: Medicare Other | Admitting: Family Medicine

## 2016-01-05 ENCOUNTER — Other Ambulatory Visit: Payer: Self-pay | Admitting: Family Medicine

## 2016-01-06 NOTE — Telephone Encounter (Signed)
Received refill electronically Last refill 06/19/15 #1/5 refills Last office visit 12/08/15/acute visit Okay to refill?

## 2016-01-11 ENCOUNTER — Other Ambulatory Visit (INDEPENDENT_AMBULATORY_CARE_PROVIDER_SITE_OTHER): Payer: Medicare Other

## 2016-01-11 ENCOUNTER — Ambulatory Visit (INDEPENDENT_AMBULATORY_CARE_PROVIDER_SITE_OTHER): Payer: Medicare Other | Admitting: Pulmonary Disease

## 2016-01-11 ENCOUNTER — Encounter: Payer: Self-pay | Admitting: Pulmonary Disease

## 2016-01-11 VITALS — BP 132/68 | HR 104 | Ht 60.0 in | Wt 202.0 lb

## 2016-01-11 DIAGNOSIS — R0600 Dyspnea, unspecified: Secondary | ICD-10-CM | POA: Diagnosis not present

## 2016-01-11 DIAGNOSIS — E7800 Pure hypercholesterolemia, unspecified: Secondary | ICD-10-CM

## 2016-01-11 DIAGNOSIS — J449 Chronic obstructive pulmonary disease, unspecified: Secondary | ICD-10-CM

## 2016-01-11 DIAGNOSIS — R7989 Other specified abnormal findings of blood chemistry: Secondary | ICD-10-CM | POA: Diagnosis not present

## 2016-01-11 DIAGNOSIS — J849 Interstitial pulmonary disease, unspecified: Secondary | ICD-10-CM

## 2016-01-11 DIAGNOSIS — J432 Centrilobular emphysema: Secondary | ICD-10-CM

## 2016-01-11 DIAGNOSIS — J8409 Other alveolar and parieto-alveolar conditions: Secondary | ICD-10-CM

## 2016-01-11 LAB — SEDIMENTATION RATE: Sed Rate: 9 mm/hr (ref 0–30)

## 2016-01-11 MED ORDER — ALBUTEROL SULFATE (2.5 MG/3ML) 0.083% IN NEBU: 2.5000 mg | INHALATION_SOLUTION | Freq: Four times a day (QID) | RESPIRATORY_TRACT | 5 refills | Status: DC | PRN

## 2016-01-11 NOTE — Assessment & Plan Note (Addendum)
I worry that Carlus Pavlov his dyspnea is due to the interstitial lung disease seen on her CT chest. This does appear to have developed over the last several months as her CT scan from 2015 did not show significant evidence of an interstitial lung disease. However, the pattern on her current CT chest is not indicative of usual interstitial pneumonitis which would be the most ominous and common form which would develop at this age. So it's not clear to me that she has idiopathic pulmonary fibrosis at this time. The only way to know for certain what's going on would be to perform another lung biopsy but I think that would be too detrimental to her health right now.  I believe that her dyspnea is a multifactorial process and due to some degree of asthma, deconditioning, and obesity.  Plan: Pulmonary rehabilitation referral Check serology for underlying connective tissue disease which may be contributing to her interstitial lung disease Repeat pulmonary function tests in October, if there is evidence of worsening diffusion capacity or total lung capacity then consider empiric treatment versus a biopsy Plan repeat CT chest 08/2016

## 2016-01-11 NOTE — Patient Instructions (Signed)
We will refer you to pulmonary rehabilitation Keep taking Symbicort as you were doing Use albuterol as needed for shortness of breath, chest tightness or wheezing, etc. use the albuterol nebulizer instead of the inhaler We will see you back in October after the next option that

## 2016-01-11 NOTE — Progress Notes (Signed)
Subjective:    Patient ID: Susan [name], female    DOB: [birth year], 75 y.o.   MRN: [id number]  Synopsis: Susan [name] first saw the [facility] pulmonary clinic in September 2014 for shortness of breath. She had a previous heavy smoking history but he quit by the time she saw me. Lung function testing was actually not consistent with obstruction but she did have emphysema on her chest CT. Spiriva was started empirically for that. She also had a home sleep study which showed an apnea hypopnea index of 44. A follow-up split-night study showed an AHI of only 18, CPAP was titrated to 10 cm of water but she was only able to wear it for approximately 15 minutes. She has had difficulty using CPAP when prescribed in the past.  HPI  Chief Complaint  Patient presents with  . Follow-up    Pt c/o increased dyspnea with exertion. Pt would like to inquire about a nedulizer. Pt denies cough/wheeze/cp/tightness or heart palpitations. Pt has been using Symbicort BID but does not feel it is helping her.    Elishia says that she still has dyspnea. Dyspnea> she says that she has it primarily with walking.  If she walks from her car in the parking lot into a movie theater; smoke, chemicals hair spray don't make her more short of breath. She sometimes feels scared when she feel dyspnea.  She has an albuterol inhaler which relieves the dyspnea.    Inhaled medicines> she uses symbicort two puffs bid regularly; she uses the albuterol as needed.  She is not coughing.   No chest pain, no leg swelling.    She does not exercise regularly.    Past Medical History:  Diagnosis Date  . Acute peptic ulcer, unspecified site, with hemorrhage and perforation, without mention of obstruction   . Arthritis   . Asthma   . Balance problems    unsteady on feet at time  . Chronic airway obstruction, not elsewhere classified    on inhalers  . Diverticulosis of colon (without mention of hemorrhage)   . Hematochezia    . 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 Ventral hernia      Review of Systems  Constitutional: Negative for chills, fatigue and fever.  HENT: Negative for congestion, nosebleeds, postnasal drip and rhinorrhea.   Respiratory: Negative for cough, shortness of breath and wheezing.   Cardiovascular: Negative for chest pain, palpitations and leg swelling.       Objective:   Physical Exam  Vitals:   01/11/16 1607  BP: 132/68  BP Location: Left Arm  Cuff Size: Normal  Pulse: (!) 104  SpO2: 94%  Weight: 202 lb (91.6 kg)  Height: 5' (1.524 m)  RA  Gen: comfortable HEENT: NCAT, EOMi PULM: Few crackles left base  CV: RRR, no mgr AB: BS+, soft, nontender Ext: warm, no edema  June 2014 full pulmonary function test at [facility] ratio 80%, FEV1 1.74 L (101% predicted) total lung capacity is 3.81 L (91% predicted), DLCO 11.0 (56% predicted) May 2014 chest x-ray no acute cardiopulmonary abnormality, questionable hyperinflation 01/2013 CT chest 61m sub pleural nodule, mild emphysema 01/2013 CT chest 749msub pleural nodule, mild emphysema 04/22/2013 CT chest > 4m90module unchanged 08/2014 CT chest > nodule unchanged, progressive interstitial infilatrate bilateral lower lobes of uncertain etiology  April 2017 high-resolution CT chest: Findings worrisome for slightly progressive but nonspecific interstitial lung disease. 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  09/2015 PFT repeat > 86% FVC 2.03L (85% pred), FEV1 1.72L (96% pred), TLC 3.98L (89% pred), DLCO 12.12 (64% pred)     Assessment & Plan:   ILD (interstitial lung disease) ([place]) I worry that Susan [name] his dyspnea is due to the interstitial lung disease seen on her CT chest. This does appear to have developed over the last several months as her CT scan from 2015 did not show significant evidence of an interstitial lung disease. However, the pattern on her current CT chest  is not indicative of usual interstitial pneumonitis which would be the most ominous and common form which would develop at this age. So it's not clear to me that she has idiopathic pulmonary fibrosis at this time. The only way to know for certain what's going on would be to perform another lung biopsy but I think that would be too detrimental to her health right now.  I believe that her dyspnea is a multifactorial process and due to some degree of asthma, deconditioning, and obesity.  Plan: Pulmonary rehabilitation referral Check serology for underlying connective tissue disease which may be contributing to her interstitial lung disease Repeat pulmonary function tests in October, if there is evidence of worsening diffusion capacity or total lung capacity then consider empiric treatment versus a biopsy Plan repeat CT chest 08/2016   Centrilobular emphysema ([place]) Continue Symbicort as prescribed Pulmonary rehabilitation referral Prescribe albuterol as needed the in nebulizer  > 50% of this 27 minu visit spent face to face  Updated Medication List Outpatient Encounter Prescriptions as of 01/11/2016  Medication Sig  . acyclovir (ZOVIRAX) 400 MG tablet TAKE ONE TABLET TWICE A DAY (Patient taking differently: TAKE ONE TABLET TWICE A DAY as needed)  . aspirin 81 MG tablet Take 81 mg by mouth daily.  Marland Kitchen ibuprofen (ADVIL,MOTRIN) 200 MG tablet Take 600 mg by mouth every 6 (six) hours as needed.   . Multiple Vitamin (MULTIVITAMIN WITH MINERALS) TABS tablet Take 1 tablet by mouth daily.  . Na Sulfate-K Sulfate-Mg Sulf (SUPREP BOWEL PREP KIT) 17.5-3.13-1.6 GM/180ML SOLN Suprep as directed / no substitutions  . Probiotic Product (PROBIOTIC PO) Take 1 tablet by mouth daily.  Marland Kitchen rOPINIRole (REQUIP) 3 MG tablet TAKE 1 TABLET EVERY NIGHT AT BEDTIME  . SYMBICORT 160-4.5 MCG/ACT inhaler INHALE 2 PUFFS INTO THE LUNGS 2 (TWO) TIMES DAILY.  . traMADol (ULTRAM) 50 MG tablet TAKE 1 OR 2 TABLETS EVERY 6 HOURS AS  NEEDED FOR PAIN  . traZODone (DESYREL) 50 MG tablet TAKE ONE AND ONE-HALF TABLETS AT BEDTIME. AS NEEDED FOR SLEEP  . VENTOLIN HFA 108 (90 Base) MCG/ACT inhaler INHALE 2 PUFFS 1 OR 2 MINUTES APART EVERY 6 HOURS AS NEEDED FOR WHEEZING TIGHT COUGH OR SHORTNESS OF BREATH  . albuterol (PROVENTIL) (2.5 MG/3ML) 0.083% nebulizer solution Take 3 mLs (2.5 mg total) by nebulization every 6 (six) hours as needed for wheezing or shortness of breath.   Facility-Administered Encounter Medications as of 01/11/2016  Medication  . dexamethasone (DECADRON) injection 4 mg  . ondansetron (ZOFRAN) 4 mg in sodium chloride 0.9 % 50 mL IVPB  . ondansetron (ZOFRAN) 4 mg in sodium chloride 0.9 % 50 mL IVPB

## 2016-01-11 NOTE — Assessment & Plan Note (Signed)
Continue Symbicort as prescribed Pulmonary rehabilitation referral Prescribe albuterol as needed the in nebulizer

## 2016-01-12 ENCOUNTER — Ambulatory Visit (INDEPENDENT_AMBULATORY_CARE_PROVIDER_SITE_OTHER): Payer: Medicare Other | Admitting: Family Medicine

## 2016-01-12 ENCOUNTER — Encounter: Payer: Self-pay | Admitting: Family Medicine

## 2016-01-12 ENCOUNTER — Encounter: Payer: Self-pay | Admitting: *Deleted

## 2016-01-12 VITALS — BP 148/98 | HR 98 | Temp 97.4°F | Ht 59.25 in | Wt 200.0 lb

## 2016-01-12 DIAGNOSIS — Z0000 Encounter for general adult medical examination without abnormal findings: Secondary | ICD-10-CM | POA: Diagnosis not present

## 2016-01-12 DIAGNOSIS — Z23 Encounter for immunization: Secondary | ICD-10-CM

## 2016-01-12 DIAGNOSIS — J849 Interstitial pulmonary disease, unspecified: Secondary | ICD-10-CM

## 2016-01-12 DIAGNOSIS — E7800 Pure hypercholesterolemia, unspecified: Secondary | ICD-10-CM

## 2016-01-12 DIAGNOSIS — J432 Centrilobular emphysema: Secondary | ICD-10-CM | POA: Diagnosis not present

## 2016-01-12 DIAGNOSIS — R030 Elevated blood-pressure reading, without diagnosis of hypertension: Secondary | ICD-10-CM | POA: Diagnosis not present

## 2016-01-12 LAB — COMPREHENSIVE METABOLIC PANEL
ALBUMIN: 4.1 g/dL (ref 3.5–5.2)
ALT: 25 U/L (ref 0–35)
AST: 19 U/L (ref 0–37)
Alkaline Phosphatase: 76 U/L (ref 39–117)
BUN: 20 mg/dL (ref 6–23)
CALCIUM: 8.8 mg/dL (ref 8.4–10.5)
CHLORIDE: 109 meq/L (ref 96–112)
CO2: 26 mEq/L (ref 19–32)
CREATININE: 0.96 mg/dL (ref 0.40–1.20)
GFR: 60.35 mL/min (ref >60.00)
Glucose, Bld: 88 mg/dL (ref 70–99)
Potassium: 4.3 mEq/L (ref 3.5–5.1)
Sodium: 144 mEq/L (ref 135–145)
TOTAL PROTEIN: 7 g/dL (ref 6.0–8.3)
Total Bilirubin: 0.4 mg/dL (ref 0.2–1.2)

## 2016-01-12 LAB — LIPID PANEL
CHOL/HDL RATIO: 3
Cholesterol: 177 mg/dL (ref 0–200)
HDL: 55.9 mg/dL (ref >39.00)
NonHDL: 120.85
TRIGLYCERIDES: 243 mg/dL — AB (ref 0.0–149.0)
VLDL: 48.6 mg/dL — ABNORMAL HIGH (ref 0.0–40.0)

## 2016-01-12 LAB — LDL CHOLESTEROL, DIRECT: Direct LDL: 107 mg/dL

## 2016-01-12 LAB — CYCLIC CITRUL PEPTIDE ANTIBODY, IGG: Cyclic Citrullin Peptide Ab: <16 Units

## 2016-01-12 LAB — ANTI-SCLERODERMA ANTIBODY: SCLERODERMA (SCL-70) (ENA) ANTIBODY, IGG: NEGATIVE

## 2016-01-12 LAB — SJOGRENS SYNDROME-A EXTRACTABLE NUCLEAR ANTIBODY: SSA (RO) (ENA) ANTIBODY, IGG: NEGATIVE

## 2016-01-12 LAB — ANA: ANA: NEGATIVE

## 2016-01-12 LAB — SJOGRENS SYNDROME-B EXTRACTABLE NUCLEAR ANTIBODY: SSB (LA) (ENA) ANTIBODY, IGG: NEGATIVE

## 2016-01-12 LAB — CENTROMERE ANTIBODIES: Centromere Ab Screen: <1

## 2016-01-12 LAB — ANTI-JO 1 ANTIBODY, IGG: Anti JO-1: <0.2 AI (ref 0.0–0.9)

## 2016-01-12 LAB — RHEUMATOID FACTOR: Rhuematoid fact SerPl-aCnc: <10 IU/mL (ref <=14)

## 2016-01-12 MED ORDER — ROPINIROLE HCL 3 MG PO TABS: 3.0000 mg | ORAL_TABLET | Freq: Every day | ORAL | 3 refills | Status: DC

## 2016-01-12 NOTE — Patient Instructions (Addendum)
Follow BP at home.. Call with measurement in 2 weeks.  Call to schedule mammogram on your own, one last one!

## 2016-01-12 NOTE — Assessment & Plan Note (Addendum)
Followed by Dr. Lake Bells,  DOE worsening per pt.

## 2016-01-12 NOTE — Assessment & Plan Note (Signed)
Followed by Dr. Lake Bells. DOE worsening per pt.

## 2016-01-12 NOTE — Assessment & Plan Note (Signed)
Due for re-eval. 

## 2016-01-12 NOTE — Progress Notes (Signed)
I have personally reviewed the Medicare Annual Wellness questionnaire and have noted  1. The patient's medical and social history  2. Their use of alcohol, tobacco or illicit drugs  3. Their current medications and supplements  4. The patient's functional ability including ADL's, fall risks, home safety risks and hearing or visual  impairment.  5. Diet and physical activities  6. Evidence for depression or mood disorders  The patients weight, height, BMI and visual acuity have been recorded in the chart  I have made referrals, counseling and provided education to the patient based review of the above and I have provided the pt with a written personalized care plan for preventive services.   DOE: Neg cardiac work up, nml PFTs.   Followed by pulmonologist. Last OV yesterday.. Dx with interstitial lung disease on chest CTas well as centrilobular emphesema  She is having progressive shortness of breath.  Recommended pulmonary rehab, eval for connective tissue disease  PFTs to be repeated in 02/2016 Repeat CT chest panned 08/2016  Continue symbicort and albuterol prn.  In 11/2015 She received 2 steroid injections in b shoulders for OA. Dr. Lorelei Pont. This has helped symptoms a lot.   Elevated Cholesterol: Due for re-eval..goal LDL <130. She has lipids and CMET done at last pulm visit. Lab Results  Component Value Date   CHOL 164 11/19/2014   HDL 45.20 11/19/2014   LDLCALC 95 11/19/2014   LDLDIRECT 146.6 10/20/2010   TRIG 120.0 11/19/2014   CHOLHDL 4 11/19/2014  Diet compliance:  Has been eating more overall. Exercise:  Limited Other complaints:  Wt Readings from Last 3 Encounters:  01/12/16 200 lb (90.7 kg)  01/11/16 202 lb (91.6 kg)  12/08/15 189 lb 8 oz (86 kg)     Elevated BP today BP Readings from Last 3 Encounters:  01/12/16 (!) 148/98  01/11/16 132/68  12/08/15 (!) 151/76   Has had B hip revisions surgery 05/2011, 4/ 2013 (Dr. Ennis Forts) and left foot surgery  (Dr. Ouida Sills)  11/2011  Using cane as balance is worse with past foot and ankle issues.  Social History /Family History/Past Medical History reviewed and updated if needed.  Review of Systems  Constitutional: Negative for fever, fatigue and unexpected weight change.  HENT: Negative for ear pain, congestion, sore throat, sneezing, trouble swallowing and sinus pressure.  Eyes: Negative for pain and itching.  Respiratory: Negative for cough, stable shortness of breath  With exertion  and wheezing.  Cardiovascular: Negative for chest pain, palpitations and leg swelling.  Gastrointestinal: Negative for nausea, abdominal pain, diarrhea, constipation and blood in stool.  Genitourinary: Negative for frequency. Negative for dysuria, hematuria, vaginal discharge, difficulty urinating and menstrual problem.  Skin: Negative for rash.  Neurological: Negative for syncope, weakness, light-headedness, numbness and headaches.  Psychiatric/Behavioral: Negative for confusion and dysphoric mood. The patient is not nervous/anxious.   Objective:   Physical Exam  Constitutional: Vital signs are normal. She appears well-developed and well-nourished. She is cooperative. Non-toxic appearance. She does not appear ill. No distress.  HENT:  Head: Normocephalic.  Right Ear: Hearing, tympanic membrane, external ear and ear canal normal.  Left Ear: Hearing, tympanic membrane, external ear and ear canal normal.  Nose: Nose normal.  Eyes: Conjunctivae, EOM and lids are normal. Pupils are equal, round, and reactive to light. No foreign bodies found.  Neck: Trachea normal and normal range of motion. Neck supple. Carotid bruit is not present. No mass and no thyromegaly present.  Cardiovascular: Normal rate, regular rhythm, S1 normal,  S2 normal, normal heart sounds and intact distal pulses. Exam reveals no gallop.  No murmur heard.  Pulmonary/Chest: Effort normal and breath sounds normal. No respiratory distress. She has no wheezes. She  has no rhonchi. She has no rales.  Abdominal: Soft. Normal appearance and bowel sounds are normal. She exhibits no distension, no fluid wave, no abdominal bruit and no mass. There is no hepatosplenomegaly. There is no tenderness. There is no rebound, no guarding and no CVA tenderness. No hernia.  Musculoskeletal:  Lymphadenopathy:  She has no cervical adenopathy.  She has no axillary adenopathy.  Neurological: She is alert. She has normal strength. No cranial nerve deficit or sensory deficit.  Skin: Skin is warm, dry and intact. No rash noted.  Psychiatric: Her speech is normal and behavior is normal. Judgment normal. Her mood appears not anxious. Cognition and memory are normal. She does not exhibit a depressed mood.    Refused breast exam. Assessment & Plan:   The patient's preventative maintenance and recommended screening tests for an annual wellness exam were reviewed in full today.  Brought up to date unless services declined.  Counselled on the importance of diet, exercise, and its role in overall health and mortality.  The patient's FH and SH was reviewed, including their home life, tobacco status, and drug and alcohol status.   Vaccines:Uptodate with all.  Flu was given. DEXA: last in 12/2011, repeat in 5 years. Colon: Dr. Sharlett Iles. Procedure date: 10/29/2007  Next Due Date Colonoscopy: 10/2012 ... Will Plan cologuard per pt request. Mammo: 01/04/2013 nml, repeat due now. DVE/pap:not indicated due to age. No family history of uterine or ovarian cancer.

## 2016-01-12 NOTE — Progress Notes (Signed)
Pre visit review using our clinic review tool, if applicable. No additional management support is needed unless otherwise documented below in the visit note. 

## 2016-01-14 LAB — ALDOLASE: ALDOLASE: 7.7 U/L (ref <=8.1)

## 2016-01-18 ENCOUNTER — Telehealth: Payer: Self-pay | Admitting: Pulmonary Disease

## 2016-01-18 DIAGNOSIS — J849 Interstitial pulmonary disease, unspecified: Secondary | ICD-10-CM

## 2016-01-18 NOTE — Telephone Encounter (Signed)
Called and spoke to [place]. Pt's PFT does not qualify her to attend pulm rehab under the diagnosis of COPD. However, under the order of ILD the pt can attend. Spoke with Gorden Harms. And ok to place order under ILD. Order placed. Portia verbalized understanding and denied any further questions or concerns at this time.

## 2016-01-20 ENCOUNTER — Telehealth: Payer: Self-pay | Admitting: Pulmonary Disease

## 2016-01-20 NOTE — Telephone Encounter (Signed)
Notes Recorded by Len Blalock, CMA on 01/20/2016 at 1:23 PM EDT lmtcb X1 for pt to relay results/recs. ------ Notes Recorded by Juanito Doom, MD on 01/20/2016 at 1:16 PM EDT A, Please let the patient know this was OK Thanks, B  --------------------------------------------------------- Spoke with pt. She is aware of results. Nothing further was needed.

## 2016-02-02 ENCOUNTER — Other Ambulatory Visit: Payer: Self-pay | Admitting: Family Medicine

## 2016-02-03 ENCOUNTER — Encounter: Payer: Self-pay | Admitting: Family Medicine

## 2016-02-03 ENCOUNTER — Ambulatory Visit (INDEPENDENT_AMBULATORY_CARE_PROVIDER_SITE_OTHER): Payer: Medicare Other | Admitting: Family Medicine

## 2016-02-03 VITALS — BP 132/82 | HR 89 | Temp 98.2°F | Wt 201.8 lb

## 2016-02-03 DIAGNOSIS — J309 Allergic rhinitis, unspecified: Secondary | ICD-10-CM

## 2016-02-03 DIAGNOSIS — J069 Acute upper respiratory infection, unspecified: Secondary | ICD-10-CM

## 2016-02-03 DIAGNOSIS — J849 Interstitial pulmonary disease, unspecified: Secondary | ICD-10-CM

## 2016-02-03 MED ORDER — PREDNISONE 20 MG PO TABS: ORAL_TABLET | ORAL | 0 refills | Status: DC

## 2016-02-03 MED ORDER — FLUTICASONE PROPIONATE 50 MCG/ACT NA SUSP: 2.0000 | Freq: Every day | NASAL | 6 refills | Status: DC

## 2016-02-03 NOTE — Progress Notes (Signed)
Pre visit review using our clinic review tool, if applicable. No additional management support is needed unless otherwise documented below in the visit note. 

## 2016-02-03 NOTE — Progress Notes (Signed)
Subjective:    Patient ID: Susan [name], female    DOB: [birth year], 75 y.o.   MRN: [id number]  HPI This is a 75 yo female who presents today with cough, nasal congestion (clear drainage) and sore throat for 2 days. Cough occasional and non productive. Felt like she had a fever. Mild headache. No medication for symptoms other than saline nasal spray. She got a nebulizer recently to use for her albuterol administration which has helped with her symptoms. She has not used this morning and has heard some wheezes. Has been using once a day. She sees Dr. Lake Bells (pulmonary) who has prescribed pulmonary rehab for the patient. The patient is waiting to hear about her start date. 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   Past Medical History:  Diagnosis Date  . Acute peptic ulcer, unspecified site, with hemorrhage and perforation, without mention of obstruction   . Arthritis   . Asthma   . Balance problems    unsteady on feet at time  . Chronic airway obstruction, not elsewhere classified    on inhalers  . Diverticulosis of colon (without mention of hemorrhage)   . Hematochezia   . 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 Ventral hernia    Past Surgical History:  Procedure Laterality Date  . APPENDECTOMY    . BUNIONECTOMY Bilateral   . COSMETIC SURGERY  1970's   abdomen. Tummy Tuck  . FOOT SURGERY Bilateral dec 2015 right    left 3 yrs ago  . TOTAL HIP ARTHROPLASTY  2007   left  . TOTAL HIP ARTHROPLASTY     bilateral   . TOTAL KNEE ARTHROPLASTY Right 08/08/2014   Procedure: RIGHT TOTAL KNEE ARTHROPLASTY WITH NAVIGATION;  Surgeon: Rod Can, MD;  Location: [facility];  Service: Orthopedics;  Laterality: Right;  . TOTAL KNEE ARTHROPLASTY Left 10/03/2014   Procedure: LEFT TOTAL KNEE ARTHROPLASTY;  Surgeon: Rod Can, MD;  Location: [facility];  Service: Orthopedics;  Laterality: Left;  . TUBAL LIGATION    . UMBILICAL HERNIA REPAIR     2016   Family  History  Problem Relation Age of Onset  . Diabetes Mother   . Coronary artery disease Mother   . Lung disease Mother   . Heart disease Mother     CAD  . Coronary artery disease Father   . Kidney disease Father   . Diabetes Father   . Heart disease Father     CAD  . Cancer Neg Hx    Social History  Substance Use Topics  . Smoking status: Former Smoker    Packs/day: 0.50    Years: 40.00    Types: Cigarettes    Quit date: 08/29/2000  . Smokeless tobacco: Never Used  . Alcohol use No     Comment: rare      Review of Systems Per HPI    Objective:   Physical Exam  Constitutional: She is oriented to person, place, and time. She appears well-developed and well-nourished. No distress.  Obese.   HENT:  Head: Normocephalic and atraumatic.  Right Ear: Tympanic membrane, external ear and ear canal normal.  Left Ear: Tympanic membrane, external ear and ear canal normal.  Nose: Mucosal edema and rhinorrhea present. Right sinus exhibits no frontal sinus tenderness. Left sinus exhibits no maxillary sinus tenderness and no frontal sinus tenderness.  Mouth/Throat: Uvula is midline and mucous membranes are normal. Posterior oropharyngeal erythema present. No oropharyngeal exudate, posterior oropharyngeal edema  or tonsillar abscesses.  Post nasal drainage noted.   Cardiovascular: Normal rate, regular rhythm and normal heart sounds.   Pulmonary/Chest: Effort normal. She has wheezes (few faint expiratory ).  Musculoskeletal: She exhibits edema (trace pretibial).  Neurological: She is alert and oriented to person, place, and time.  Skin: Skin is warm and dry. She is not diaphoretic.  Psychiatric: She has a normal mood and affect. Her behavior is normal. Judgment and thought content normal.  Vitals reviewed.    BP 132/82   Pulse 89   Temp 98.2 F (36.8 C)   Wt 201 lb 12.8 oz (91.5 kg)   SpO2 93%   BMI 40.42 kg/m  Wt Readings from Last 3 Encounters:  02/03/16 201 lb 12.8 oz (91.5 kg)   01/12/16 200 lb (90.7 kg)  01/11/16 202 lb (91.6 kg)       Assessment & Plan:  1. Upper respiratory infection with cough and congestion - suspect viral in nature, but in patient with known ILD and presenting with wheezing - discussed symptomatic treatment and increased use of albuterol over the next two days to control wheezing - increase fluids - RTC precautions reviewed  2. ILD (interstitial lung disease) ([place]) - she prefers not to take prednisone, but I have provided a wait and see prescription if she is not better in 2-3 days. - predniSONE (DELTASONE) 20 MG tablet; Take 3 tablets x 3 days, then 2 x 3 days, then 1 x 3 days  Dispense: 18 tablet; Refill: 0  3. Allergic rhinitis, unspecified allergic rhinitis type - can add Afrin for 3 days, continue saline nasal spray - fluticasone (FLONASE) 50 MCG/ACT nasal spray; Place 2 sprays into both nostrils daily.  Dispense: 16 g; Refill: Zebulon, FNP-BC  [facility], [place]  02/04/2016 10:17 AM

## 2016-02-03 NOTE — Patient Instructions (Addendum)
Please use your nebulizer every 2-4 hours while you are awake for the next 2 days then every 2 to 4 hours as needed for cough/wheeze Please get some Afrin nasal spray (generic is fine) and use twice a day for three days- can use before using Flonase For sore throat pain you can use salt water gargles, tylenol or ibuprofen If not better in 2-3 days you can start the prednisone prescription

## 2016-02-05 ENCOUNTER — Other Ambulatory Visit: Payer: Self-pay | Admitting: Family Medicine

## 2016-02-12 ENCOUNTER — Other Ambulatory Visit: Payer: Self-pay | Admitting: Family Medicine

## 2016-02-12 ENCOUNTER — Telehealth: Payer: Self-pay

## 2016-02-12 DIAGNOSIS — J849 Interstitial pulmonary disease, unspecified: Secondary | ICD-10-CM

## 2016-02-12 NOTE — Telephone Encounter (Signed)
Pt was seen 02/03/16 and thought rx for prednisone was going to be called to [facility]; pt said she is feeling worse and [facility] does not have rx. I advised pt per office note that pt was given rx in case she did not improve to get filled. Pt will not leave work today until 6:30 and pt is not sure she knows where her paperwork is at home. Pt request prednisone sent to [facility] [place]. Pt request cb.

## 2016-02-12 NOTE — Telephone Encounter (Signed)
-----   Message from Elby Beck, [place] sent at 02/12/2016  4:08 PM EDT ----- I have approved prednisone prescription for patient.  Thanks,  [employer]

## 2016-03-07 ENCOUNTER — Ambulatory Visit: Payer: Medicare Other | Admitting: Internal Medicine

## 2016-03-11 ENCOUNTER — Ambulatory Visit (INDEPENDENT_AMBULATORY_CARE_PROVIDER_SITE_OTHER): Payer: Medicare Other | Admitting: Family Medicine

## 2016-03-11 ENCOUNTER — Encounter: Payer: Self-pay | Admitting: Family Medicine

## 2016-03-11 VITALS — BP 142/78 | HR 79 | Temp 97.5°F | Wt 203.0 lb

## 2016-03-11 DIAGNOSIS — N3001 Acute cystitis with hematuria: Secondary | ICD-10-CM

## 2016-03-11 DIAGNOSIS — R350 Frequency of micturition: Secondary | ICD-10-CM

## 2016-03-11 LAB — POCT UA - MICROSCOPIC ONLY

## 2016-03-11 LAB — POC URINALSYSI DIPSTICK (AUTOMATED)
Bilirubin, UA: NEGATIVE
Glucose, UA: NEGATIVE
Ketones, UA: NEGATIVE
Nitrite, UA: POSITIVE
PH UA: 6
PROTEIN UA: NEGATIVE
UROBILINOGEN UA: NEGATIVE

## 2016-03-11 MED ORDER — SULFAMETHOXAZOLE-TRIMETHOPRIM 800-160 MG PO TABS: 1.0000 | ORAL_TABLET | Freq: Two times a day (BID) | ORAL | 0 refills | Status: DC

## 2016-03-11 NOTE — Assessment & Plan Note (Signed)
Uncomplicated. Treat with 3 day course of antibiotics. Push fluids.

## 2016-03-11 NOTE — Progress Notes (Signed)
   Subjective:    Patient ID: Susan [name], female    DOB: 28-Dec-1940, 75 y.o.   MRN: [id number]  HPI  75 year old female with history of urge incontinence presents with new onset dysuria, increase frequency, urgency   X 1 week.  Feels ill, no fever. No blood in urine  No abd pain, no new mid back.  Has taken test from store.. Told her she had UTI.  Going to beach on Monday.  She has tried advil to help.Marland Kitchen Helped minimally.  Drinking lots of water.  No recent antibiotics in last 3 months.  Social History /Family History/Past Medical History reviewed and updated if needed.   Review of Systems  Constitutional: Positive for fatigue. Negative for fever.  HENT: Negative for ear pain.   Eyes: Negative for pain.  Respiratory: Negative for chest tightness and shortness of breath.   Cardiovascular: Negative for chest pain, palpitations and leg swelling.  Gastrointestinal: Negative for abdominal pain.  Genitourinary: Negative for dysuria.       Objective:   Physical Exam  Constitutional: Vital signs are normal. She appears well-developed and well-nourished. She is cooperative.  Non-toxic appearance. She does not appear ill. No distress.  HENT:  Head: Normocephalic.  Right Ear: Hearing, tympanic membrane, external ear and ear canal normal. Tympanic membrane is not erythematous, not retracted and not bulging.  Left Ear: Hearing, tympanic membrane, external ear and ear canal normal. Tympanic membrane is not erythematous, not retracted and not bulging.  Nose: No mucosal edema or rhinorrhea. Right sinus exhibits no maxillary sinus tenderness and no frontal sinus tenderness. Left sinus exhibits no maxillary sinus tenderness and no frontal sinus tenderness.  Mouth/Throat: Uvula is midline, oropharynx is clear and moist and mucous membranes are normal.  Eyes: Conjunctivae, EOM and lids are normal. Pupils are equal, round, and reactive to light. Lids are everted and swept, no foreign bodies  found.  Neck: Trachea normal and normal range of motion. Neck supple. Carotid bruit is not present. No thyroid mass and no thyromegaly present.  Cardiovascular: Normal rate, regular rhythm, S1 normal, S2 normal, normal heart sounds, intact distal pulses and normal pulses.  Exam reveals no gallop and no friction rub.   No murmur heard. Pulmonary/Chest: Effort normal and breath sounds normal. No tachypnea. No respiratory distress. She has no decreased breath sounds. She has no wheezes. She has no rhonchi. She has no rales.  Abdominal: Soft. Normal appearance and bowel sounds are normal. There is no hepatosplenomegaly. There is no tenderness. There is no CVA tenderness.  Neurological: She is alert.  Skin: Skin is warm, dry and intact. No rash noted.  Psychiatric: Her speech is normal and behavior is normal. Judgment and thought content normal. Her mood appears not anxious. Cognition and memory are normal. She does not exhibit a depressed mood.          Assessment & Plan:

## 2016-03-11 NOTE — Progress Notes (Signed)
Pre visit review using our clinic review tool, if applicable. No additional management support is needed unless otherwise documented below in the visit note. 

## 2016-03-14 LAB — URINE CULTURE

## 2016-03-18 ENCOUNTER — Encounter (INDEPENDENT_AMBULATORY_CARE_PROVIDER_SITE_OTHER): Payer: Medicare Other | Admitting: Pulmonary Disease

## 2016-03-18 ENCOUNTER — Encounter: Payer: Self-pay | Admitting: Pulmonary Disease

## 2016-03-18 ENCOUNTER — Ambulatory Visit (INDEPENDENT_AMBULATORY_CARE_PROVIDER_SITE_OTHER): Payer: Medicare Other | Admitting: Pulmonary Disease

## 2016-03-18 ENCOUNTER — Other Ambulatory Visit (INDEPENDENT_AMBULATORY_CARE_PROVIDER_SITE_OTHER): Payer: Medicare Other

## 2016-03-18 VITALS — BP 136/74 | HR 97 | Ht 60.0 in | Wt 202.0 lb

## 2016-03-18 DIAGNOSIS — J849 Interstitial pulmonary disease, unspecified: Secondary | ICD-10-CM | POA: Diagnosis not present

## 2016-03-18 DIAGNOSIS — R0602 Shortness of breath: Secondary | ICD-10-CM

## 2016-03-18 DIAGNOSIS — G4733 Obstructive sleep apnea (adult) (pediatric): Secondary | ICD-10-CM

## 2016-03-18 DIAGNOSIS — J432 Centrilobular emphysema: Secondary | ICD-10-CM | POA: Diagnosis not present

## 2016-03-18 LAB — PULMONARY FUNCTION TEST
DL/VA % PRED: 78 %
DL/VA: 3.33 ml/min/mmHg/L
DLCO COR % PRED: 64 %
DLCO COR: 12.11 ml/min/mmHg
DLCO UNC % PRED: 68 %
DLCO UNC: 12.86 ml/min/mmHg
FEF 25-75 POST: 0.98 L/s
FEF 25-75 PRE: 1.99 L/s
FEF2575-%CHANGE-POST: -50 %
FEF2575-%PRED-POST: 67 %
FEF2575-%Pred-Pre: 136 %
FEV1-%CHANGE-POST: -10 %
FEV1-%PRED-POST: 86 %
FEV1-%Pred-Pre: 96 %
FEV1-Post: 1.52 L
FEV1-Pre: 1.7 L
FEV1FVC-%Change-Post: 5 %
FEV1FVC-%PRED-PRE: 111 %
FEV6-%Change-Post: -15 %
FEV6-%PRED-POST: 76 %
FEV6-%Pred-Pre: 91 %
FEV6-POST: 1.72 L
FEV6-Pre: 2.04 L
FEV6FVC-%PRED-POST: 105 %
FEV6FVC-%Pred-Pre: 105 %
FVC-%Change-Post: -15 %
FVC-%PRED-PRE: 86 %
FVC-%Pred-Post: 72 %
FVC-PRE: 2.04 L
FVC-Post: 1.72 L
POST FEV1/FVC RATIO: 88 %
PRE FEV1/FVC RATIO: 83 %
Post FEV6/FVC ratio: 100 %
Pre FEV6/FVC Ratio: 100 %
RV % pred: 95 %
RV: 1.98 L
TLC % pred: 93 %
TLC: 4.15 L

## 2016-03-18 LAB — CBC WITH DIFFERENTIAL/PLATELET
Basophils Absolute: 0 10*3/uL (ref 0.0–0.1)
Basophils Relative: 0.1 % (ref 0.0–3.0)
EOS PCT: 3.9 % (ref 0.0–5.0)
Eosinophils Absolute: 0.3 10*3/uL (ref 0.0–0.7)
HCT: 41.1 % (ref 36.0–46.0)
Hemoglobin: 13.9 g/dL (ref 12.0–15.0)
LYMPHS PCT: 21.1 % (ref 12.0–46.0)
Lymphs Abs: 1.7 10*3/uL (ref 0.7–4.0)
MCHC: 33.8 g/dL (ref 30.0–36.0)
MCV: 88.9 fl (ref 78.0–100.0)
MONO ABS: 0.8 10*3/uL (ref 0.1–1.0)
MONOS PCT: 10.3 % (ref 3.0–12.0)
Neutro Abs: 5.1 10*3/uL (ref 1.4–7.7)
Neutrophils Relative %: 64.6 % (ref 43.0–77.0)
Platelets: 185 10*3/uL (ref 150.0–400.0)
RBC: 4.62 Mil/uL (ref 3.87–5.11)
RDW: 14.5 % (ref 11.5–15.5)
WBC: 7.9 10*3/uL (ref 4.0–10.5)

## 2016-03-18 NOTE — Progress Notes (Signed)
Subjective:    Patient ID: Susan [name], female    DOB: [birth year], 75 y.o.   MRN: [id number]  Synopsis: [name] first saw the [facility] pulmonary clinic in September 2014 for shortness of breath. She had a previous heavy smoking history but he quit by the time she saw me. Lung function testing was actually not consistent with obstruction but she did have emphysema on her chest CT. Spiriva was started empirically for that. She also had a home sleep study which showed an apnea hypopnea index of 44. A follow-up split-night study showed an AHI of only 18, CPAP was titrated to 10 cm of water but she was only able to wear it for approximately 15 minutes. She has had difficulty using CPAP when prescribed in the past.     HPI  Chief Complaint  Patient presents with  . Follow-up    review pft.  pt c/o worsening SOB with any exertion.  denies mucus congestion, chest pain.     Dajia says she can't breathe. > whenever she moves she feels it > not gasping for air > she feels good otherwise > it is definitely worse than a year ago > she has gained 20 pounds in the last year > no ankle swelling > can walk with a walker at a slow pace and feels OK, but if she walks quickly she gets dyspneic.  She is not coughing.  She reports having bronchitis years ago but this has improved as she has quit smoking.  She has been told that she has been told she has diastolic dysfunction.  She has not been swelling in her ankles or legs.    She has been using her CPAP machine since the last visit.     Past Medical History:  Diagnosis Date  . Acute peptic ulcer, unspecified site, with hemorrhage and perforation, without mention of obstruction   . Arthritis   . Asthma   . Balance problems    unsteady on feet at time  . Chronic airway obstruction, not elsewhere classified    on inhalers  . Diverticulosis of colon (without mention of hemorrhage)   . Hematochezia   . 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 Ventral hernia      Review of Systems  Constitutional: Negative for chills, fatigue and fever.  HENT: Negative for congestion, nosebleeds, postnasal drip and rhinorrhea.   Respiratory: Negative for cough, shortness of breath and wheezing.   Cardiovascular: Negative for chest pain, palpitations and leg swelling.       Objective:   Physical Exam  Vitals:   03/18/16 1600  BP: 136/74  BP Location: Left Arm  Cuff Size: Normal  Pulse: 97  SpO2: 97%  Weight: 202 lb (91.6 kg)  Height: 5' (1.524 m)  RA  Gen: comfortable, no  HEENT: NCAT, EOMi PULM: Few crackles left bases, normal effort CV: RRR, no mgr AB: BS+, soft, nontender Ext: warm, no edema  Imaging: May 2014 chest x-ray no acute cardiopulmonary abnormality, questionable hyperinflation 01/2013 CT chest 20mm sub pleural nodule, mild emphysema 01/2013 CT chest 77mm sub pleural nodule, mild emphysema 04/22/2013 CT chest > 87mm nodule unchanged 08/2014 CT chest > nodule unchanged, progressive interstitial infilatrate bilateral lower lobes of uncertain etiology April 2017 high-resolution CT chest: Findings worrisome for slightly progressive but nonspecific interstitial lung disease. 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 (Personally reviewed again 03/18/2016).  PFT: June 2014 full pulmonary function [facility] ratio 80%, FEV1 1.74 L (101% predicted) total lung capacity is 3.81 L (91% predicted), DLCO 11.0 (56% predicted) 09/2015 PFT repeat > 86% FVC 2.03L (85% pred), FEV1 1.72L (96% pred), TLC 3.98L (89% pred), DLCO 12.12 (64% pred)  Labs: 12/2015 ANA, CCP, RF, SCL-70, Jo-1, SSA, SSB, Aldolase all negative     Assessment & Plan:   ILD (interstitial lung disease) ([place]) She has an ill-defined interstitial lung disease on her HRCT scan of her chest.  It is not clear to me if this is causing her dyspnea as her PFT's this year were not any worse  (in fact they were slighlty better) than her PFT's in 2014.  I xplained to her that dyspnea can come from many different causes, and these changes in her lungs may or may not be the cause.  Moving forward the plan is: Repeat PFT and HRCT now CBC Pro-BNP now Repeat Echo If PFT or HRT worse, I will perform a bronchoscopy to assess this problem further F/u 4 weeks.  Centrilobular emphysema (HCC) Continue Symbicort and albuterol. Flu shot is up-to-date.  Obstructive sleep apnea Continue CPAP  Diastolic dysfunction She does not appear volume overloaded on exam today but considering her significant dyspnea I will check a proBNP.   Updated Medication List Outpatient Encounter Prescriptions as of 03/18/2016  Medication Sig  . acyclovir (ZOVIRAX) 400 MG tablet Take 400 mg by mouth 2 (two) times daily as needed.  Marland Kitchen albuterol (PROVENTIL) (2.5 MG/3ML) 0.083% nebulizer solution Take 3 mLs (2.5 mg total) by nebulization every 6 (six) hours as needed for wheezing or shortness of breath.  Marland Kitchen aspirin 81 MG tablet Take 81 mg by mouth daily.  . fluticasone (FLONASE) 50 MCG/ACT nasal spray Place 2 sprays into both nostrils daily.  Marland Kitchen ibuprofen (ADVIL,MOTRIN) 200 MG tablet Take 600 mg by mouth every 6 (six) hours as needed.   . Multiple Vitamin (MULTIVITAMIN WITH MINERALS) TABS tablet Take 1 tablet by mouth daily.  . Probiotic Product (PROBIOTIC PO) Take 1 tablet by mouth daily.  Marland Kitchen rOPINIRole (REQUIP) 3 MG tablet Take 1 tablet (3 mg total) by mouth at bedtime.  . sulfamethoxazole-trimethoprim (BACTRIM DS,SEPTRA DS) 800-160 MG tablet Take 1 tablet by mouth 2 (two) times daily.  . SYMBICORT 160-4.5 MCG/ACT inhaler INHALE 2 PUFFS INTO THE LUNGS 2 (TWO) TIMES DAILY.  . traMADol (ULTRAM) 50 MG tablet TAKE 1 OR 2 TABLETS EVERY 6 HOURS AS NEEDED FOR PAIN  . traZODone (DESYREL) 50 MG tablet TAKE ONE AND ONE-HALF TABLETS AT BEDTIME. AS NEEDED FOR SLEEP  . VENTOLIN HFA 108 (90 Base) MCG/ACT inhaler INHALE 2  PUFFS 1 OR 2 MINUTES APART EVERY 6 HOURS AS NEEDED FOR WHEEZING TIGHT COUGH OR SHORTNESS OF BREATH   Facility-Administered Encounter Medications as of 03/18/2016  Medication  . dexamethasone (DECADRON) injection 4 mg  . ondansetron (ZOFRAN) 4 mg in sodium chloride 0.9 % 50 mL IVPB  . ondansetron (ZOFRAN) 4 mg in sodium chloride 0.9 % 50 mL IVPB

## 2016-03-18 NOTE — Assessment & Plan Note (Signed)
She has an ill-defined interstitial lung disease on her HRCT scan of her chest.  It is not clear to me if this is causing her dyspnea as her PFT's this year were not any worse (in fact they were slighlty better) than her PFT's in 2014.  I xplained to her that dyspnea can come from many different causes, and these changes in her lungs may or may not be the cause.  Moving forward the plan is: Repeat PFT and HRCT now CBC Pro-BNP now Repeat Echo If PFT or HRT worse, I will perform a bronchoscopy to assess this problem further F/u 4 weeks.

## 2016-03-18 NOTE — Assessment & Plan Note (Signed)
Continue Symbicort and albuterol. Flu shot is up-to-date.

## 2016-03-18 NOTE — Assessment & Plan Note (Signed)
She does not appear volume overloaded on exam today but considering her significant dyspnea I will check a proBNP.

## 2016-03-18 NOTE — Patient Instructions (Signed)
We are going to arrange a heart test, blood work, and some lung tests including a pulmonary function test and high-resolution CT scan of the chest and then see you back in 4 weeks. You may need to have a bronchoscopy but we will see what these tests show first.

## 2016-03-18 NOTE — Assessment & Plan Note (Signed)
Continue CPAP.  

## 2016-03-19 LAB — PRO B NATRIURETIC PEPTIDE: NT-PRO BNP: 298 pg/mL (ref 0–301)

## 2016-03-30 ENCOUNTER — Ambulatory Visit: Payer: Medicare Other | Admitting: Family Medicine

## 2016-04-05 ENCOUNTER — Ambulatory Visit (HOSPITAL_BASED_OUTPATIENT_CLINIC_OR_DEPARTMENT_OTHER): Payer: Medicare Other

## 2016-04-05 ENCOUNTER — Ambulatory Visit (INDEPENDENT_AMBULATORY_CARE_PROVIDER_SITE_OTHER)
Admission: RE | Admit: 2016-04-05 | Discharge: 2016-04-05 | Disposition: A | Discharge status: Home / Self Care | Payer: Medicare Other | Source: Ambulatory Visit | Attending: Pulmonary Disease | Admitting: Pulmonary Disease

## 2016-04-05 ENCOUNTER — Ambulatory Visit (HOSPITAL_COMMUNITY)
Admission: RE | Admit: 2016-04-05 | Discharge: 2016-04-05 | Disposition: A | Discharge status: Home / Self Care | Payer: Medicare Other | Source: Ambulatory Visit | Attending: Pulmonary Disease | Admitting: Pulmonary Disease

## 2016-04-05 ENCOUNTER — Other Ambulatory Visit: Payer: Self-pay

## 2016-04-05 DIAGNOSIS — R0602 Shortness of breath: Secondary | ICD-10-CM

## 2016-04-05 DIAGNOSIS — I071 Rheumatic tricuspid insufficiency: Secondary | ICD-10-CM | POA: Diagnosis not present

## 2016-04-05 DIAGNOSIS — Z87891 Personal history of nicotine dependence: Secondary | ICD-10-CM | POA: Diagnosis not present

## 2016-04-05 LAB — PULMONARY FUNCTION TEST
DL/VA % pred: 81 %
DL/VA: 3.46 ml/min/mmHg/L
DLCO COR % PRED: 64 %
DLCO UNC: 12.29 ml/min/mmHg
DLCO cor: 12.11 ml/min/mmHg
DLCO unc % pred: 65 %
FEF 25-75 POST: 2.6 L/s
FEF 25-75 Pre: 2.22 L/sec
FEF2575-%Change-Post: 17 %
FEF2575-%Pred-Post: 178 %
FEF2575-%Pred-Pre: 151 %
FEV1-%Change-Post: 4 %
FEV1-%PRED-POST: 98 %
FEV1-%PRED-PRE: 94 %
FEV1-POST: 1.73 L
FEV1-PRE: 1.65 L
FEV1FVC-%CHANGE-POST: 0 %
FEV1FVC-%Pred-Pre: 115 %
FEV6-%Change-Post: 4 %
FEV6-%PRED-PRE: 84 %
FEV6-%Pred-Post: 88 %
FEV6-POST: 1.98 L
FEV6-PRE: 1.9 L
FEV6FVC-%PRED-POST: 105 %
FEV6FVC-%PRED-PRE: 105 %
FVC-%CHANGE-POST: 4 %
FVC-%PRED-POST: 83 %
FVC-%PRED-PRE: 80 %
FVC-POST: 1.98 L
FVC-PRE: 1.9 L
POST FEV6/FVC RATIO: 100 %
PRE FEV1/FVC RATIO: 87 %
PRE FEV6/FVC RATIO: 100 %
Post FEV1/FVC ratio: 87 %
RV % PRED: 72 %
RV: 1.5 L
TLC % PRED: 87 %
TLC: 3.9 L

## 2016-04-05 MED ORDER — ALBUTEROL SULFATE (2.5 MG/3ML) 0.083% IN NEBU
2.5000 mg | INHALATION_SOLUTION | Freq: Once | RESPIRATORY_TRACT | Status: AC
  Administered 2016-04-05: 2.5 mg via RESPIRATORY_TRACT

## 2016-04-12 ENCOUNTER — Other Ambulatory Visit: Payer: Self-pay | Admitting: Family Medicine

## 2016-04-17 ENCOUNTER — Inpatient Hospital Stay
Admission: EM | Admit: 2016-04-17 | Discharge: 2016-04-21 | DRG: 871 | Disposition: A | Discharge status: Home / Self Care | Payer: Medicare Other | Attending: Internal Medicine | Admitting: Internal Medicine

## 2016-04-17 ENCOUNTER — Emergency Department: Payer: Medicare Other

## 2016-04-17 ENCOUNTER — Encounter: Payer: Self-pay | Admitting: Emergency Medicine

## 2016-04-17 DIAGNOSIS — J189 Pneumonia, unspecified organism: Secondary | ICD-10-CM | POA: Diagnosis present

## 2016-04-17 DIAGNOSIS — J9601 Acute respiratory failure with hypoxia: Secondary | ICD-10-CM | POA: Diagnosis present

## 2016-04-17 DIAGNOSIS — Z87891 Personal history of nicotine dependence: Secondary | ICD-10-CM

## 2016-04-17 DIAGNOSIS — Z6839 Body mass index (BMI) 39.0-39.9, adult: Secondary | ICD-10-CM

## 2016-04-17 DIAGNOSIS — J9621 Acute and chronic respiratory failure with hypoxia: Secondary | ICD-10-CM | POA: Diagnosis present

## 2016-04-17 DIAGNOSIS — N10 Acute tubulo-interstitial nephritis: Secondary | ICD-10-CM | POA: Diagnosis present

## 2016-04-17 DIAGNOSIS — E876 Hypokalemia: Secondary | ICD-10-CM | POA: Diagnosis present

## 2016-04-17 DIAGNOSIS — Z833 Family history of diabetes mellitus: Secondary | ICD-10-CM

## 2016-04-17 DIAGNOSIS — J441 Chronic obstructive pulmonary disease with (acute) exacerbation: Secondary | ICD-10-CM | POA: Diagnosis not present

## 2016-04-17 DIAGNOSIS — Z8711 Personal history of peptic ulcer disease: Secondary | ICD-10-CM

## 2016-04-17 DIAGNOSIS — R6520 Severe sepsis without septic shock: Secondary | ICD-10-CM | POA: Diagnosis present

## 2016-04-17 DIAGNOSIS — A4151 Sepsis due to Escherichia coli [E. coli]: Secondary | ICD-10-CM | POA: Diagnosis not present

## 2016-04-17 DIAGNOSIS — Z96653 Presence of artificial knee joint, bilateral: Secondary | ICD-10-CM | POA: Diagnosis present

## 2016-04-17 DIAGNOSIS — A4159 Other Gram-negative sepsis: Secondary | ICD-10-CM | POA: Diagnosis present

## 2016-04-17 DIAGNOSIS — Z841 Family history of disorders of kidney and ureter: Secondary | ICD-10-CM

## 2016-04-17 DIAGNOSIS — R778 Other specified abnormalities of plasma proteins: Secondary | ICD-10-CM | POA: Diagnosis present

## 2016-04-17 DIAGNOSIS — D72819 Decreased white blood cell count, unspecified: Secondary | ICD-10-CM | POA: Diagnosis present

## 2016-04-17 DIAGNOSIS — J440 Chronic obstructive pulmonary disease with acute lower respiratory infection: Secondary | ICD-10-CM | POA: Diagnosis present

## 2016-04-17 DIAGNOSIS — R0600 Dyspnea, unspecified: Secondary | ICD-10-CM

## 2016-04-17 DIAGNOSIS — Z96643 Presence of artificial hip joint, bilateral: Secondary | ICD-10-CM | POA: Diagnosis present

## 2016-04-17 DIAGNOSIS — E669 Obesity, unspecified: Secondary | ICD-10-CM | POA: Diagnosis present

## 2016-04-17 DIAGNOSIS — Z8249 Family history of ischemic heart disease and other diseases of the circulatory system: Secondary | ICD-10-CM

## 2016-04-17 LAB — COMPREHENSIVE METABOLIC PANEL
ALK PHOS: 90 U/L (ref 38–126)
ALT: 28 U/L (ref 14–54)
ANION GAP: 9 (ref 5–15)
AST: 32 U/L (ref 15–41)
Albumin: 4.2 g/dL (ref 3.5–5.0)
BILIRUBIN TOTAL: 1.3 mg/dL — AB (ref 0.3–1.2)
BUN: 15 mg/dL (ref 6–20)
CALCIUM: 8.7 mg/dL — AB (ref 8.9–10.3)
CO2: 21 mmol/L — ABNORMAL LOW (ref 22–32)
Chloride: 106 mmol/L (ref 101–111)
Creatinine, Ser: 0.82 mg/dL (ref 0.44–1.00)
GFR calc non Af Amer: >60 mL/min (ref >60)
Glucose, Bld: 127 mg/dL — ABNORMAL HIGH (ref 65–99)
POTASSIUM: 4 mmol/L (ref 3.5–5.1)
Sodium: 136 mmol/L (ref 135–145)
TOTAL PROTEIN: 7.4 g/dL (ref 6.5–8.1)

## 2016-04-17 LAB — LACTIC ACID, PLASMA: Lactic Acid, Venous: 2.1 mmol/L (ref 0.5–1.9)

## 2016-04-17 LAB — CBC WITH DIFFERENTIAL/PLATELET
BASOS PCT: 0 %
Basophils Absolute: 0 10*3/uL (ref 0–0.1)
EOS ABS: 0 10*3/uL (ref 0–0.7)
Eosinophils Relative: 2 %
HEMATOCRIT: 43.4 % (ref 35.0–47.0)
HEMOGLOBIN: 14.8 g/dL (ref 12.0–16.0)
LYMPHS ABS: 0.3 10*3/uL — AB (ref 1.0–3.6)
Lymphocytes Relative: 24 %
MCH: 30.7 pg (ref 26.0–34.0)
MCHC: 34 g/dL (ref 32.0–36.0)
MCV: 90.2 fL (ref 80.0–100.0)
MONO ABS: 0 10*3/uL — AB (ref 0.2–0.9)
MONOS PCT: 2 %
NEUTROS ABS: 0.8 10*3/uL — AB (ref 1.4–6.5)
NEUTROS PCT: 72 %
Platelets: 124 10*3/uL — ABNORMAL LOW (ref 150–440)
RBC: 4.81 MIL/uL (ref 3.80–5.20)
RDW: 14.2 % (ref 11.5–14.5)
WBC: 1.1 10*3/uL — CL (ref 3.6–11.0)

## 2016-04-17 LAB — BLOOD GAS, ARTERIAL
Acid-base deficit: 3 mmol/L — ABNORMAL HIGH (ref 0.0–2.0)
Bicarbonate: 19.2 mmol/L — ABNORMAL LOW (ref 20.0–28.0)
Delivery systems: POSITIVE
EXPIRATORY PAP: 6
FIO2: 0.4
INSPIRATORY PAP: 12
O2 SAT: 99.9 %
PCO2 ART: 27 mmHg — AB (ref 32.0–48.0)
PH ART: 7.46 — AB (ref 7.350–7.450)
PO2 ART: 246 mmHg — AB (ref 83.0–108.0)
Patient temperature: 37

## 2016-04-17 LAB — TROPONIN I: TROPONIN I: 0.11 ng/mL — AB (ref <0.03)

## 2016-04-17 LAB — BRAIN NATRIURETIC PEPTIDE: B NATRIURETIC PEPTIDE 5: 166 pg/mL — AB (ref 0.0–100.0)

## 2016-04-17 MED ORDER — CEFTRIAXONE SODIUM-DEXTROSE 1-3.74 GM-% IV SOLR
1.0000 g | Freq: Once | INTRAVENOUS | Status: AC
  Administered 2016-04-17: 1 g via INTRAVENOUS
  Filled 2016-04-17: qty 50

## 2016-04-17 MED ORDER — SODIUM CHLORIDE 0.9 % IV SOLN
1000.0000 mL | Freq: Once | INTRAVENOUS | Status: AC
  Administered 2016-04-17: 1000 mL via INTRAVENOUS

## 2016-04-17 MED ORDER — METHYLPREDNISOLONE SODIUM SUCC 125 MG IJ SOLR
125.0000 mg | Freq: Once | INTRAMUSCULAR | Status: AC
  Administered 2016-04-17: 125 mg via INTRAVENOUS
  Filled 2016-04-17: qty 2

## 2016-04-17 MED ORDER — IPRATROPIUM-ALBUTEROL 0.5-2.5 (3) MG/3ML IN SOLN
3.0000 mL | Freq: Once | RESPIRATORY_TRACT | Status: AC
  Administered 2016-04-17: 3 mL via RESPIRATORY_TRACT
  Filled 2016-04-17: qty 3

## 2016-04-17 MED ORDER — DEXTROSE 5 % IV SOLN
500.0000 mg | Freq: Once | INTRAVENOUS | Status: DC
  Filled 2016-04-17: qty 500

## 2016-04-17 MED ORDER — DEXTROSE 5 % IV SOLN: 1.0000 g | Freq: Once | INTRAVENOUS | Status: DC

## 2016-04-17 NOTE — ED Notes (Signed)
Report given to Ann, RN.

## 2016-04-17 NOTE — ED Provider Notes (Signed)
[facility] Emergency Department Provider Note   ____________________________________________    I have reviewed the triage vital signs and the nursing notes.   HISTORY  Chief Complaint Respiratory Distress  History limited by patient distress   HPI Susan [name] is a 75 y.o. female who presents with respiratory distress. EMS reports history of COPD they found her in significant distress and started her on BiPAP.   Past Medical History:  Diagnosis Date  . Acute peptic ulcer, unspecified site, with hemorrhage and perforation, without mention of obstruction   . Arthritis   . Asthma   . Balance problems    unsteady on feet at time  . Chronic airway obstruction, not elsewhere classified    on inhalers  . Diverticulosis of colon (without mention of hemorrhage)   . Hematochezia   . 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 Ventral hernia     Patient Active Problem List   Diagnosis Date Noted  . Near syncope 10/08/2015  . Transient vision disturbance, right 10/08/2015  . Balance problem 10/08/2015  . ILD (interstitial lung disease) ([place]) 07/29/2015  . UTI (urinary tract infection) 06/23/2015  . Nasal congestion due to prolonged use of decongestants 12/09/2014  . Chronic insomnia 12/09/2014  . Primary osteoarthritis of left knee 10/03/2014  . Primary osteoarthritis of right knee 08/08/2014  . Elevated blood-pressure reading without diagnosis of hypertension 07/31/2014  . Osteoarthritis of right knee, Severe 10/28/2013  . Glenohumeral arthritis, Severe 08/20/2013  . Centrilobular emphysema ([place]) 03/25/2013  . Solitary pulmonary nodule 02/19/2013  . Chronic knee pain 01/15/2013  . Obstructive sleep apnea 10/11/2012  . Umbilical hernia [id number]  . Urinary incontinence, urge 01/17/2012  . Diastolic dysfunction [id number]  . Preoperative cardiovascular examination 04/21/2011  . DDD (degenerative disc disease) 10/26/2010  .  RESTLESS LEG SYNDROME 02/23/2010  . HIP REPLACEMENT, LEFT, HX OF 12/21/2009  . FOOT PAIN, BILATERAL 03/25/2009  . HYPERCHOLESTEROLEMIA 06/18/2008  . ALLERGIC RHINITIS 05/21/2008  . ANKLE PAIN, BILATERAL 05/21/2008  . DIVERTICULOSIS, COLON 11/26/2007  . HERPES SIMPLEX INFECTION, RECURRENT 10/17/2007    Past Surgical History:  Procedure Laterality Date  . APPENDECTOMY    . BUNIONECTOMY Bilateral   . COSMETIC SURGERY  1970's   abdomen. Tummy Tuck  . FOOT SURGERY Bilateral dec 2015 right    left 3 yrs ago  . TOTAL HIP ARTHROPLASTY  2007   left  . TOTAL HIP ARTHROPLASTY     bilateral   . TOTAL KNEE ARTHROPLASTY Right 08/08/2014   Procedure: RIGHT TOTAL KNEE ARTHROPLASTY WITH NAVIGATION;  Surgeon: Rod Can, MD;  Location: [facility] ORS;  Service: Orthopedics;  Laterality: Right;  . TOTAL KNEE ARTHROPLASTY Left 10/03/2014   Procedure: LEFT TOTAL KNEE ARTHROPLASTY;  Surgeon: Rod Can, MD;  Location: [facility] OR[facility];  Service: Orthopedics;  Laterality: Left;  . TUBAL LIGATION    . UMBILICAL HERNIA REPAIR     2016    Prior to Admission medications   Medication Sig Start Date End Date Taking? Authorizing Provider  acyclovir (ZOVIRAX) 400 MG tablet Take 400 mg by mouth 2 (two) times daily as needed.    Historical Provider, MD  acyclovir (ZOVIRAX) 400 MG tablet TAKE ONE TABLET TWICE A DAY 04/13/16   Amy E Diona Browner, MD  albuterol (PROVENTIL) (2.5 MG/3ML) 0.083% nebulizer solution Take 3 mLs (2.5 mg total) by nebulization every 6 (six) hours as needed for wheezing or shortness of breath. 01/11/16   Juanito Doom, MD  aspirin 81  MG tablet Take 81 mg by mouth daily.    Historical Provider, MD  fluticasone (FLONASE) 50 MCG/ACT nasal spray Place 2 sprays into both nostrils daily. 02/03/16   Elby Beck, FNP  ibuprofen (ADVIL,MOTRIN) 200 MG tablet Take 600 mg by mouth every 6 (six) hours as needed.     Historical Provider, MD  Multiple Vitamin (MULTIVITAMIN WITH MINERALS) TABS tablet Take 1  tablet by mouth daily.    Historical Provider, MD  Probiotic Product (PROBIOTIC PO) Take 1 tablet by mouth daily.    Historical Provider, MD  rOPINIRole (REQUIP) 3 MG tablet Take 1 tablet (3 mg total) by mouth at bedtime. 01/12/16   Amy Cletis Athens, MD  sulfamethoxazole-trimethoprim (BACTRIM DS,SEPTRA DS) 800-160 MG tablet Take 1 tablet by mouth 2 (two) times daily. 03/11/16   Amy Cletis Athens, MD  SYMBICORT 160-4.5 MCG/ACT inhaler INHALE 2 PUFFS INTO THE LUNGS 2 (TWO) TIMES DAILY. 02/02/16   Amy Cletis Athens, MD  traMADol (ULTRAM) 50 MG tablet TAKE 1 OR 2 TABLETS EVERY 6 HOURS AS NEEDED FOR PAIN 09/29/15   Amy Cletis Athens, MD  traZODone (DESYREL) 50 MG tablet TAKE ONE AND ONE-HALF TABLETS AT BEDTIME. AS NEEDED FOR SLEEP 10/26/15   Amy E Bedsole, MD  VENTOLIN HFA 108 (90 Base) MCG/ACT inhaler INHALE 2 PUFFS 1 OR 2 MINUTES APART EVERY 6 HOURS AS NEEDED FOR WHEEZING TIGHT COUGH OR SHORTNESS OF BREATH 02/05/16   Amy Cletis Athens, MD     Allergies Patient has no known allergies.  Family History  Problem Relation Age of Onset  . Diabetes Mother   . Coronary artery disease Mother   . Lung disease Mother   . Heart disease Mother     CAD  . Coronary artery disease Father   . Kidney disease Father   . Diabetes Father   . Heart disease Father     CAD  . Cancer Neg Hx     Social History Social History  Substance Use Topics  . Smoking status: Former Smoker    Packs/day: 0.50    Years: 40.00    Types: Cigarettes    Quit date: 08/29/2000  . Smokeless tobacco: Never Used  . Alcohol use No     Comment: rare    Level V caveat: Unable to obtain Review of Systems due to patient distress   Cardiovascular: Denies chest pain.    ____________________________________________   PHYSICAL EXAM:  VITAL SIGNS: ED Triage Vitals  Enc Vitals Group     BP      Pulse      Resp      Temp      Temp src      SpO2      Weight      Height      Head Circumference      Peak Flow      Pain Score      Pain Loc       Pain Edu?      Excl. in [place]?     Constitutional: Alert and oriented. Moderate to severe distress Eyes: Conjunctivae are normal.   Nose: No congestion/rhinnorhea. Mouth/Throat: Mucous membranes are moist.    Cardiovascular: Severe tachycardia, regular rhythm. Grossly normal heart sounds.  Good peripheral circulation. Respiratory: Increased respiratory effort with tripoding  positive retractions, diffuse wheezing Gastrointestinal: Soft and nontender. No distention.  No CVA tenderness. Genitourinary: deferred Musculoskeletal: No lower extremity tenderness nor edema.  Warm and well perfused Neurologic:   No  gross focal neurologic deficits are appreciated.  Skin:  Skin is warm, dry and intact. No rash noted. Psychiatric: Unable to examine  ____________________________________________   LABS (all labs ordered are listed, but only abnormal results are displayed)  Labs Reviewed  CBC WITH DIFFERENTIAL/PLATELET - Abnormal; Notable for the following:       Result Value   WBC 1.1 (*)    Platelets 124 (*)    Neutro Abs 0.8 (*)    Lymphs Abs 0.3 (*)    Monocytes Absolute 0.0 (*)    All other components within normal limits  COMPREHENSIVE METABOLIC PANEL - Abnormal; Notable for the following:    CO2 21 (*)    Glucose, Bld 127 (*)    Calcium 8.7 (*)    Total Bilirubin 1.3 (*)    All other components within normal limits  TROPONIN I - Abnormal; Notable for the following:    Troponin I 0.11 (*)    All other components within normal limits  BRAIN NATRIURETIC PEPTIDE - Abnormal; Notable for the following:    B Natriuretic Peptide 166.0 (*)    All other components within normal limits  BLOOD GAS, ARTERIAL - Abnormal; Notable for the following:    pH, Arterial 7.46 (*)    pCO2 arterial 27 (*)    pO2, Arterial 246 (*)    Bicarbonate 19.2 (*)    Acid-base deficit 3.0 (*)    All other components within normal limits  LACTIC ACID, PLASMA - Abnormal; Notable for the following:    Lactic  Acid, Venous 2.1 (*)    All other components within normal limits  CULTURE, BLOOD (ROUTINE X 2)  CULTURE, BLOOD (ROUTINE X 2)  LACTIC ACID, PLASMA   ____________________________________________  EKG  ED ECG REPORT I, Lavonia Drafts, the attending physician, personally viewed and interpreted this ECG.  Date: 04/17/2016 EKG Time: 9:51 PM Rate: 155 Rhythm: Sinus tachycardia QRS Axis: Left axis deviation Intervals: normal ST/T Wave abnormalities: normal Conduction Disturbances: none   ____________________________________________  RADIOLOGY  Chest x-ray shows interstitial prominence ____________________________________________   PROCEDURES  Procedure(s) performed: No    Critical Care performed: yes  CRITICAL CARE Performed by: Lavonia Drafts   Total critical care time: 30 minutes  Critical care time was exclusive of separately billable procedures and treating other patients.  Critical care was necessary to treat or prevent imminent or life-threatening deterioration.  Critical care was time spent personally by me on the following activities: development of treatment plan with patient and/or surrogate as well as nursing, discussions with consultants, evaluation of patient's response to treatment, examination of patient, obtaining history from patient or surrogate, ordering and performing treatments and interventions, ordering and review of laboratory studies, ordering and review of radiographic studies, pulse oximetry and re-evaluation of patient's condition. ____________________________________________   INITIAL IMPRESSION / ASSESSMENT AND PLAN / ED COURSE  Pertinent labs & imaging results that were available during my care of the patient were reviewed by me and considered in my medical decision making (see chart for details).  Patient presents with severe shortness of breath. She is markedly tachycardic, wheezing. Suspect severe COPD exacerbation as a cause of her  respiratory failure. We have placed her on BiPAP will check an ABG, labs, chest x-ray and closely monitor  Clinical Course   ----------------------------------------- 10:49 PM on 04/17/2016 -----------------------------------------  Patient's heart rate is gradually decreasing, her ABG is reassuring. We will trial her off BiPAP. Her temperature is elevated and her white blood cell count is low,  her lactic is high, given this I'm concerned about possible infection, we'll treat with Rocephin and azithromycin. She will require admission. I believe her troponin is related to strain, this will need to be monitored. ____________________________________________   FINAL CLINICAL IMPRESSION(S) / ED DIAGNOSES  Final diagnoses:  COPD exacerbation ([place])  Acute respiratory failure with hypoxia ([place])  Community acquired pneumonia, unspecified laterality      NEW MEDICATIONS STARTED DURING THIS VISIT:  New Prescriptions   No medications on file     Note:  This document was prepared using Dragon voice recognition software and may include unintentional dictation errors.    Lavonia Drafts, MD 04/17/16 2249

## 2016-04-17 NOTE — ED Triage Notes (Signed)
Pt presents to ED with c/o respiratory distress. Per EMS pt is from [facility]. EMS states was called out for breathing trouble for a "couple of days". Per EMS upon their arrival pt was unable to breathe or speak. Pt arrives on CPAP with a RR on EMS arrival of 40. Per EMS 2 duonebs and an albuterol treatment given en route. EMS also states increased confusion at this time per patient's friends.

## 2016-04-18 ENCOUNTER — Inpatient Hospital Stay: Payer: Medicare Other

## 2016-04-18 ENCOUNTER — Ambulatory Visit: Payer: Medicare Other | Admitting: Pulmonary Disease

## 2016-04-18 DIAGNOSIS — R6520 Severe sepsis without septic shock: Secondary | ICD-10-CM | POA: Diagnosis present

## 2016-04-18 DIAGNOSIS — A4159 Other Gram-negative sepsis: Secondary | ICD-10-CM | POA: Diagnosis present

## 2016-04-18 DIAGNOSIS — D72819 Decreased white blood cell count, unspecified: Secondary | ICD-10-CM | POA: Diagnosis present

## 2016-04-18 DIAGNOSIS — E876 Hypokalemia: Secondary | ICD-10-CM | POA: Diagnosis present

## 2016-04-18 DIAGNOSIS — A4151 Sepsis due to Escherichia coli [E. coli]: Secondary | ICD-10-CM | POA: Diagnosis present

## 2016-04-18 DIAGNOSIS — Z6839 Body mass index (BMI) 39.0-39.9, adult: Secondary | ICD-10-CM | POA: Diagnosis not present

## 2016-04-18 DIAGNOSIS — J440 Chronic obstructive pulmonary disease with acute lower respiratory infection: Secondary | ICD-10-CM | POA: Diagnosis present

## 2016-04-18 DIAGNOSIS — Z8711 Personal history of peptic ulcer disease: Secondary | ICD-10-CM | POA: Diagnosis not present

## 2016-04-18 DIAGNOSIS — J9601 Acute respiratory failure with hypoxia: Secondary | ICD-10-CM | POA: Diagnosis present

## 2016-04-18 DIAGNOSIS — J189 Pneumonia, unspecified organism: Secondary | ICD-10-CM | POA: Diagnosis present

## 2016-04-18 DIAGNOSIS — Z87891 Personal history of nicotine dependence: Secondary | ICD-10-CM | POA: Diagnosis not present

## 2016-04-18 DIAGNOSIS — E669 Obesity, unspecified: Secondary | ICD-10-CM | POA: Diagnosis present

## 2016-04-18 DIAGNOSIS — J441 Chronic obstructive pulmonary disease with (acute) exacerbation: Secondary | ICD-10-CM | POA: Diagnosis present

## 2016-04-18 DIAGNOSIS — Z96653 Presence of artificial knee joint, bilateral: Secondary | ICD-10-CM | POA: Diagnosis present

## 2016-04-18 DIAGNOSIS — N10 Acute tubulo-interstitial nephritis: Secondary | ICD-10-CM | POA: Diagnosis present

## 2016-04-18 DIAGNOSIS — R778 Other specified abnormalities of plasma proteins: Secondary | ICD-10-CM | POA: Diagnosis present

## 2016-04-18 DIAGNOSIS — Z96643 Presence of artificial hip joint, bilateral: Secondary | ICD-10-CM | POA: Diagnosis present

## 2016-04-18 DIAGNOSIS — Z8249 Family history of ischemic heart disease and other diseases of the circulatory system: Secondary | ICD-10-CM | POA: Diagnosis not present

## 2016-04-18 DIAGNOSIS — J9621 Acute and chronic respiratory failure with hypoxia: Secondary | ICD-10-CM | POA: Diagnosis present

## 2016-04-18 DIAGNOSIS — Z833 Family history of diabetes mellitus: Secondary | ICD-10-CM | POA: Diagnosis not present

## 2016-04-18 DIAGNOSIS — Z841 Family history of disorders of kidney and ureter: Secondary | ICD-10-CM | POA: Diagnosis not present

## 2016-04-18 LAB — COMPREHENSIVE METABOLIC PANEL WITH GFR
ALT: 30 U/L (ref 14–54)
AST: 39 U/L (ref 15–41)
Albumin: 3.6 g/dL (ref 3.5–5.0)
Alkaline Phosphatase: 60 U/L (ref 38–126)
Anion gap: 9 (ref 5–15)
BUN: 22 mg/dL — ABNORMAL HIGH (ref 6–20)
CO2: 23 mmol/L (ref 22–32)
Calcium: 8.5 mg/dL — ABNORMAL LOW (ref 8.9–10.3)
Chloride: 108 mmol/L (ref 101–111)
Creatinine, Ser: 0.92 mg/dL (ref 0.44–1.00)
GFR calc Af Amer: >60 mL/min
GFR calc non Af Amer: 60 mL/min — ABNORMAL LOW
Glucose, Bld: 121 mg/dL — ABNORMAL HIGH (ref 65–99)
Potassium: 3.7 mmol/L (ref 3.5–5.1)
Sodium: 140 mmol/L (ref 135–145)
Total Bilirubin: 0.8 mg/dL (ref 0.3–1.2)
Total Protein: 6.9 g/dL (ref 6.5–8.1)

## 2016-04-18 LAB — URINALYSIS COMPLETE WITH MICROSCOPIC (ARMC ONLY)
BILIRUBIN URINE: NEGATIVE
GLUCOSE, UA: NEGATIVE mg/dL
KETONES UR: NEGATIVE mg/dL
NITRITE: POSITIVE — AB
Protein, ur: 30 mg/dL — AB
SPECIFIC GRAVITY, URINE: 1.005 (ref 1.005–1.030)
pH: 6 (ref 5.0–8.0)

## 2016-04-18 LAB — BASIC METABOLIC PANEL
Anion gap: 10 (ref 5–15)
BUN: 16 mg/dL (ref 6–20)
CALCIUM: 8 mg/dL — AB (ref 8.9–10.3)
CHLORIDE: 110 mmol/L (ref 101–111)
CO2: 18 mmol/L — ABNORMAL LOW (ref 22–32)
CREATININE: 1.25 mg/dL — AB (ref 0.44–1.00)
GFR, EST AFRICAN AMERICAN: 48 mL/min — AB (ref >60)
GFR, EST NON AFRICAN AMERICAN: 41 mL/min — AB (ref >60)
Glucose, Bld: 212 mg/dL — ABNORMAL HIGH (ref 65–99)
Potassium: 3.3 mmol/L — ABNORMAL LOW (ref 3.5–5.1)
SODIUM: 138 mmol/L (ref 135–145)

## 2016-04-18 LAB — CBC
HCT: 38.9 % (ref 35.0–47.0)
HEMOGLOBIN: 12.9 g/dL (ref 12.0–16.0)
MCH: 30 pg (ref 26.0–34.0)
MCHC: 33.2 g/dL (ref 32.0–36.0)
MCV: 90.3 fL (ref 80.0–100.0)
Platelets: 110 10*3/uL — ABNORMAL LOW (ref 150–440)
RBC: 4.31 MIL/uL (ref 3.80–5.20)
RDW: 14 % (ref 11.5–14.5)
WBC: 13.5 10*3/uL — ABNORMAL HIGH (ref 3.6–11.0)

## 2016-04-18 LAB — BLOOD CULTURE ID PANEL (REFLEXED)
Acinetobacter baumannii: NOT DETECTED
CANDIDA TROPICALIS: NOT DETECTED
CARBAPENEM RESISTANCE: NOT DETECTED
Candida albicans: NOT DETECTED
Candida glabrata: NOT DETECTED
Candida krusei: NOT DETECTED
Candida parapsilosis: NOT DETECTED
ENTEROCOCCUS SPECIES: NOT DETECTED
ESCHERICHIA COLI: DETECTED — AB
Enterobacter cloacae complex: NOT DETECTED
Enterobacteriaceae species: DETECTED — AB
HAEMOPHILUS INFLUENZAE: NOT DETECTED
Klebsiella oxytoca: NOT DETECTED
Klebsiella pneumoniae: DETECTED — AB
LISTERIA MONOCYTOGENES: NOT DETECTED
NEISSERIA MENINGITIDIS: NOT DETECTED
PROTEUS SPECIES: NOT DETECTED
Pseudomonas aeruginosa: NOT DETECTED
SERRATIA MARCESCENS: NOT DETECTED
STAPHYLOCOCCUS AUREUS BCID: NOT DETECTED
STAPHYLOCOCCUS SPECIES: NOT DETECTED
STREPTOCOCCUS AGALACTIAE: NOT DETECTED
STREPTOCOCCUS PNEUMONIAE: NOT DETECTED
Streptococcus pyogenes: NOT DETECTED
Streptococcus species: NOT DETECTED

## 2016-04-18 LAB — LACTIC ACID, PLASMA: LACTIC ACID, VENOUS: 2.4 mmol/L — AB (ref 0.5–1.9)

## 2016-04-18 LAB — LIPID PANEL
Cholesterol: 126 mg/dL (ref 0–200)
HDL: 44 mg/dL (ref >40)
LDL CALC: 74 mg/dL (ref 0–99)
TRIGLYCERIDES: 40 mg/dL (ref <150)
Total CHOL/HDL Ratio: 2.9 RATIO
VLDL: 8 mg/dL (ref 0–40)

## 2016-04-18 LAB — TROPONIN I
TROPONIN I: 0.31 ng/mL — AB (ref <0.03)
TROPONIN I: 0.31 ng/mL — AB (ref <0.03)
Troponin I: 0.3 ng/mL (ref <0.03)

## 2016-04-18 LAB — TSH: TSH: 0.813 u[IU]/mL (ref 0.350–4.500)

## 2016-04-18 LAB — MAGNESIUM: Magnesium: 1.9 mg/dL (ref 1.7–2.4)

## 2016-04-18 MED ORDER — ONDANSETRON HCL 4 MG PO TABS: 4.0000 mg | ORAL_TABLET | Freq: Four times a day (QID) | ORAL | Status: DC | PRN

## 2016-04-18 MED ORDER — ROPINIROLE HCL 1 MG PO TABS
3.0000 mg | ORAL_TABLET | Freq: Every day | ORAL | Status: DC
  Administered 2016-04-18 – 2016-04-20 (×4): 3 mg via ORAL
  Filled 2016-04-18 (×5): qty 3

## 2016-04-18 MED ORDER — DEXTROMETHORPHAN POLISTIREX ER 30 MG/5ML PO SUER
30.0000 mg | Freq: Two times a day (BID) | ORAL | Status: DC
  Administered 2016-04-18 – 2016-04-21 (×7): 30 mg via ORAL
  Filled 2016-04-18 (×8): qty 5

## 2016-04-18 MED ORDER — GUAIFENESIN ER 600 MG PO TB12
600.0000 mg | ORAL_TABLET | Freq: Two times a day (BID) | ORAL | Status: DC
  Administered 2016-04-18 – 2016-04-21 (×7): 600 mg via ORAL
  Filled 2016-04-18 (×7): qty 1

## 2016-04-18 MED ORDER — ONDANSETRON HCL 4 MG/2ML IJ SOLN: 4.0000 mg | Freq: Four times a day (QID) | INTRAMUSCULAR | Status: DC | PRN

## 2016-04-18 MED ORDER — ENOXAPARIN SODIUM 40 MG/0.4ML ~~LOC~~ SOLN: 40.0000 mg | SUBCUTANEOUS | Status: DC

## 2016-04-18 MED ORDER — ENOXAPARIN SODIUM 100 MG/ML ~~LOC~~ SOLN
1.0000 mg/kg | Freq: Once | SUBCUTANEOUS | Status: AC
  Administered 2016-04-18: 90 mg via SUBCUTANEOUS
  Filled 2016-04-18: qty 1

## 2016-04-18 MED ORDER — DM-GUAIFENESIN ER 30-600 MG PO TB12: 1.0000 | ORAL_TABLET | Freq: Two times a day (BID) | ORAL | Status: DC

## 2016-04-18 MED ORDER — ACETAMINOPHEN 650 MG RE SUPP
650.0000 mg | Freq: Four times a day (QID) | RECTAL | Status: DC | PRN
Start: 2016-04-18 — End: 2016-04-21

## 2016-04-18 MED ORDER — CHLORHEXIDINE GLUCONATE 0.12 % MT SOLN
15.0000 mL | Freq: Two times a day (BID) | OROMUCOSAL | Status: DC
  Administered 2016-04-18 – 2016-04-21 (×6): 15 mL via OROMUCOSAL
  Filled 2016-04-18 (×6): qty 15

## 2016-04-18 MED ORDER — ACETAMINOPHEN 325 MG PO TABS: 650.0000 mg | ORAL_TABLET | Freq: Four times a day (QID) | ORAL | Status: DC | PRN

## 2016-04-18 MED ORDER — ENOXAPARIN SODIUM 100 MG/ML ~~LOC~~ SOLN
1.0000 mg/kg | Freq: Two times a day (BID) | SUBCUTANEOUS | Status: DC
  Administered 2016-04-18: 90 mg via SUBCUTANEOUS
  Filled 2016-04-18: qty 1

## 2016-04-18 MED ORDER — CEFTRIAXONE SODIUM-DEXTROSE 1-3.74 GM-% IV SOLR
1.0000 g | INTRAVENOUS | Status: DC
  Filled 2016-04-18: qty 50

## 2016-04-18 MED ORDER — POTASSIUM CHLORIDE CRYS ER 20 MEQ PO TBCR
40.0000 meq | EXTENDED_RELEASE_TABLET | Freq: Once | ORAL | Status: AC
  Administered 2016-04-18: 40 meq via ORAL
  Filled 2016-04-18: qty 2

## 2016-04-18 MED ORDER — ENOXAPARIN SODIUM 40 MG/0.4ML ~~LOC~~ SOLN
40.0000 mg | SUBCUTANEOUS | Status: DC
  Administered 2016-04-19 – 2016-04-20 (×2): 40 mg via SUBCUTANEOUS
  Filled 2016-04-18 (×2): qty 0.4

## 2016-04-18 MED ORDER — ADULT MULTIVITAMIN W/MINERALS CH
1.0000 | ORAL_TABLET | Freq: Every day | ORAL | Status: DC
  Administered 2016-04-18 – 2016-04-21 (×4): 1 via ORAL
  Filled 2016-04-18 (×4): qty 1

## 2016-04-18 MED ORDER — SODIUM CHLORIDE 0.9% FLUSH
3.0000 mL | Freq: Two times a day (BID) | INTRAVENOUS | Status: DC
  Administered 2016-04-18 – 2016-04-21 (×6): 3 mL via INTRAVENOUS

## 2016-04-18 MED ORDER — BISACODYL 5 MG PO TBEC: 5.0000 mg | DELAYED_RELEASE_TABLET | Freq: Every day | ORAL | Status: DC | PRN

## 2016-04-18 MED ORDER — MAGNESIUM CITRATE PO SOLN
1.0000 | Freq: Once | ORAL | Status: DC | PRN
  Filled 2016-04-18: qty 296

## 2016-04-18 MED ORDER — METHYLPREDNISOLONE SODIUM SUCC 125 MG IJ SOLR
60.0000 mg | Freq: Four times a day (QID) | INTRAMUSCULAR | Status: DC
  Administered 2016-04-18 (×3): 60 mg via INTRAVENOUS
  Filled 2016-04-18 (×3): qty 2

## 2016-04-18 MED ORDER — SODIUM CHLORIDE 0.9 % IV SOLN
1.0000 g | Freq: Two times a day (BID) | INTRAVENOUS | Status: DC
Start: 2016-04-18 — End: 2016-04-19
  Administered 2016-04-18 – 2016-04-19 (×2): 1 g via INTRAVENOUS
  Filled 2016-04-18 (×3): qty 1

## 2016-04-18 MED ORDER — SENNOSIDES-DOCUSATE SODIUM 8.6-50 MG PO TABS: 1.0000 | ORAL_TABLET | Freq: Every evening | ORAL | Status: DC | PRN

## 2016-04-18 MED ORDER — FLUTICASONE PROPIONATE 50 MCG/ACT NA SUSP
2.0000 | Freq: Every day | NASAL | Status: DC
  Administered 2016-04-18 – 2016-04-21 (×4): 2 via NASAL
  Filled 2016-04-18: qty 16

## 2016-04-18 MED ORDER — HYDROCODONE-ACETAMINOPHEN 5-325 MG PO TABS
1.0000 | ORAL_TABLET | ORAL | Status: DC | PRN
  Administered 2016-04-18: 2 via ORAL
  Filled 2016-04-18: qty 2

## 2016-04-18 MED ORDER — ORAL CARE MOUTH RINSE: 15.0000 mL | Freq: Two times a day (BID) | OROMUCOSAL | Status: DC

## 2016-04-18 MED ORDER — IPRATROPIUM-ALBUTEROL 0.5-2.5 (3) MG/3ML IN SOLN
3.0000 mL | Freq: Four times a day (QID) | RESPIRATORY_TRACT | Status: DC | PRN
  Administered 2016-04-18 (×2): 3 mL via RESPIRATORY_TRACT
  Filled 2016-04-18 (×3): qty 3

## 2016-04-18 MED ORDER — MOMETASONE FURO-FORMOTEROL FUM 200-5 MCG/ACT IN AERO
2.0000 | INHALATION_SPRAY | Freq: Two times a day (BID) | RESPIRATORY_TRACT | Status: DC
  Administered 2016-04-18 – 2016-04-21 (×7): 2 via RESPIRATORY_TRACT
  Filled 2016-04-18: qty 8.8

## 2016-04-18 MED ORDER — DEXTROSE 5 % IV SOLN
500.0000 mg | INTRAVENOUS | Status: DC
  Administered 2016-04-19 – 2016-04-20 (×2): 500 mg via INTRAVENOUS
  Filled 2016-04-18 (×2): qty 500

## 2016-04-18 MED ORDER — RISAQUAD PO CAPS
1.0000 | ORAL_CAPSULE | Freq: Every day | ORAL | Status: DC
  Administered 2016-04-18 – 2016-04-21 (×4): 1 via ORAL
  Filled 2016-04-18 (×4): qty 1

## 2016-04-18 MED ORDER — ZOLPIDEM TARTRATE 5 MG PO TABS
5.0000 mg | ORAL_TABLET | Freq: Every evening | ORAL | Status: DC | PRN
  Administered 2016-04-20: 5 mg via ORAL
  Filled 2016-04-18: qty 1

## 2016-04-18 MED ORDER — ASPIRIN 81 MG PO CHEW
81.0000 mg | CHEWABLE_TABLET | Freq: Every day | ORAL | Status: DC
  Administered 2016-04-18 – 2016-04-21 (×4): 81 mg via ORAL
  Filled 2016-04-18 (×4): qty 1

## 2016-04-18 MED ORDER — SODIUM CHLORIDE 0.9 % IV SOLN
INTRAVENOUS | Status: DC
  Administered 2016-04-18 – 2016-04-19 (×2): via INTRAVENOUS

## 2016-04-18 MED ORDER — ACYCLOVIR 200 MG PO CAPS
400.0000 mg | ORAL_CAPSULE | Freq: Two times a day (BID) | ORAL | Status: DC
  Administered 2016-04-18 – 2016-04-19 (×4): 400 mg via ORAL
  Filled 2016-04-18 (×5): qty 2

## 2016-04-18 NOTE — Progress Notes (Signed)
ANTIBIOTIC CONSULT NOTE - INITIAL  Pharmacy Consult for meropenem  Indication: pneumonia  No Known Allergies  Patient Measurements: Height: 5' (152.4 cm) Weight: 201 lb 6.4 oz (91.4 kg) IBW/kg (Calculated) : 45.5 Adjusted Body Weight:   Vital Signs: Temp: 97.6 F (36.4 C) (11/27 0726) Temp Source: Oral (11/27 0726) BP: 112/58 (11/27 0726) Pulse Rate: 101 (11/27 0726) Intake/Output from previous day: 11/26 0701 - 11/27 0700 In: 1120 [P.O.:120; I.V.:1000] Out: -  Intake/Output from this shift: No intake/output data recorded.  Labs:  Recent Labs  04/17/16 2155 04/18/16 0525  WBC 1.1* 13.5*  HGB 14.8 12.9  PLT 124* 110*  CREATININE 0.82 1.25*   Estimated Creatinine Clearance: 39.8 mL/min (by C-G formula based on SCr of 1.25 mg/dL (H)). No results for input(s): VANCOTROUGH, VANCOPEAK, VANCORANDOM, GENTTROUGH, GENTPEAK, GENTRANDOM, TOBRATROUGH, TOBRAPEAK, TOBRARND, AMIKACINPEAK, AMIKACINTROU, AMIKACIN in the last 72 hours.   Microbiology: Recent Results (from the past 720 hour(s))  Blood culture (routine x 2)     Status: None (Preliminary result)   Collection Time: 04/17/16  9:55 PM  Result Value Ref Range Status   Specimen Description BLOOD RIGHT ANTECUBITAL  Final   Special Requests   Final    BOTTLES DRAWN AEROBIC AND ANAEROBIC [id number]   Culture  Setup Time   Final    GRAM NEGATIVE RODS AEROBIC BOTTLE ONLY CRITICAL VALUE NOTED.  VALUE IS CONSISTENT WITH PREVIOUSLY REPORTED AND CALLED VALUE.    Culture GRAM NEGATIVE RODS  Final   Report Status PENDING  Incomplete  Blood culture (routine x 2)     Status: None (Preliminary result)   Collection Time: 04/17/16  9:55 PM  Result Value Ref Range Status   Specimen Description BLOOD BLOOD LEFT HAND  Final   Special Requests   Final    BOTTLES DRAWN AEROBIC AND ANAEROBIC [id number]   Culture  Setup Time   Final    GRAM NEGATIVE RODS IN BOTH AEROBIC AND ANAEROBIC BOTTLES CRITICAL RESULT CALLED TO, READ  BACK BY AND VERIFIED WITH: HANK ZOMPA @ 1245 04/18/2016 QSD Performed at [facility]  Final   Report Status PENDING  Incomplete  Blood Culture ID Panel (Reflexed)     Status: Abnormal   Collection Time: 04/17/16  9:55 PM  Result Value Ref Range Status   Enterococcus species NOT DETECTED NOT DETECTED Final   Listeria monocytogenes NOT DETECTED NOT DETECTED Final   Staphylococcus species NOT DETECTED NOT DETECTED Final   Staphylococcus aureus NOT DETECTED NOT DETECTED Final   Streptococcus species NOT DETECTED NOT DETECTED Final   Streptococcus agalactiae NOT DETECTED NOT DETECTED Final   Streptococcus pneumoniae NOT DETECTED NOT DETECTED Final   Streptococcus pyogenes NOT DETECTED NOT DETECTED Final   Acinetobacter baumannii NOT DETECTED NOT DETECTED Final   Enterobacteriaceae species DETECTED (A) NOT DETECTED Final    Comment: CRITICAL RESULT CALLED TO, READ BACK BY AND VERIFIED WITH:  HANK ZOMPA @ 1245 04/18/2016 QSD    Enterobacter cloacae complex NOT DETECTED NOT DETECTED Final   Escherichia coli DETECTED (A) NOT DETECTED Final    Comment: CRITICAL RESULT CALLED TO, READ BACK BY AND VERIFIED WITH:  HANK ZOMPA @ [id number] 04/18/2016 QSD    Klebsiella oxytoca NOT DETECTED NOT DETECTED Final   Klebsiella pneumoniae DETECTED (A) NOT DETECTED Final    Comment: CRITICAL RESULT CALLED TO, READ BACK BY AND VERIFIED WITH: HANK ZOMPA @ 1245 04/18/2016 QSD    Proteus species NOT DETECTED NOT DETECTED  Final   Serratia marcescens NOT DETECTED NOT DETECTED Final   Carbapenem resistance NOT DETECTED NOT DETECTED Final   Haemophilus influenzae NOT DETECTED NOT DETECTED Final   Neisseria meningitidis NOT DETECTED NOT DETECTED Final   Pseudomonas aeruginosa NOT DETECTED NOT DETECTED Final   Candida albicans NOT DETECTED NOT DETECTED Final   Candida glabrata NOT DETECTED NOT DETECTED Final   Candida krusei NOT DETECTED NOT DETECTED Final   Candida parapsilosis  NOT DETECTED NOT DETECTED Final   Candida tropicalis NOT DETECTED NOT DETECTED Final    Medical History: Past Medical History:  Diagnosis Date  . Acute peptic ulcer, unspecified site, with hemorrhage and perforation, without mention of obstruction   . Arthritis   . Asthma   . Balance problems    unsteady on feet at time  . Chronic airway obstruction, not elsewhere classified    on inhalers  . Diverticulosis of colon (without mention of hemorrhage)   . Hematochezia   . 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 Ventral hernia     Medications:  Prescriptions Prior to Admission  Medication Sig Dispense Refill Last Dose  . acyclovir (ZOVIRAX) 400 MG tablet TAKE ONE TABLET TWICE A DAY 60 tablet 0 prn  . albuterol (PROVENTIL) (2.5 MG/3ML) 0.083% nebulizer solution Take 3 mLs (2.5 mg total) by nebulization every 6 (six) hours as needed for wheezing or shortness of breath. 360 mL 5 prn  . aspirin 81 MG tablet Take 81 mg by mouth daily.   04/17/2016 at Unknown time  . fluticasone (FLONASE) 50 MCG/ACT nasal spray Place 2 sprays into both nostrils daily. 16 g 6 04/17/2016 at Unknown time  . ibuprofen (ADVIL,MOTRIN) 200 MG tablet Take 600 mg by mouth every 6 (six) hours as needed.    prn  . Multiple Vitamin (MULTIVITAMIN WITH MINERALS) TABS tablet Take 1 tablet by mouth daily.   04/17/2016 at Unknown time  . Probiotic Product (PROBIOTIC PO) Take 1 tablet by mouth daily.   04/17/2016 at Unknown time  . rOPINIRole (REQUIP) 3 MG tablet Take 1 tablet (3 mg total) by mouth at bedtime. 30 tablet 3 prn  . SYMBICORT 160-4.5 MCG/ACT inhaler INHALE 2 PUFFS INTO THE LUNGS 2 (TWO) TIMES DAILY. 10.2 g 5 04/17/2016 at Unknown time  . traZODone (DESYREL) 50 MG tablet TAKE ONE AND ONE-HALF TABLETS AT BEDTIME. AS NEEDED FOR SLEEP 45 tablet 3 04/16/2016 at Unknown time  . VENTOLIN HFA 108 (90 Base) MCG/ACT inhaler INHALE 2 PUFFS 1 OR 2 MINUTES APART EVERY 6 HOURS AS NEEDED FOR WHEEZING TIGHT COUGH OR  SHORTNESS OF BREATH 18 each 2 prn  . traMADol (ULTRAM) 50 MG tablet TAKE 1 OR 2 TABLETS EVERY 6 HOURS AS NEEDED FOR PAIN (Patient not taking: Reported on 04/17/2016) 60 tablet 1 Not Taking at Unknown time   Assessment: CrCl = 39.8 ml/min   Goal of Therapy:  resolution of infection  Plan:  Meropenem 500 mg IV Q8H originally ordered.  Will adjust dose to Meropenem 1 gm IV Q12H based on renal function and indication (CAP).   Bionca Mckey D 04/18/2016,3:57 PM

## 2016-04-18 NOTE — Progress Notes (Signed)
ANTICOAGULATION CONSULT NOTE - Initial Consult  Pharmacy Consult for enoxaparin Indication: ACS / NSTEMI / CP  No Known Allergies  Patient Measurements: Height: 5' (152.4 cm) Weight: 201 lb 6.4 oz (91.4 kg) IBW/kg (Calculated) : 45.5 Enoxaparin Dosing Weight: 91.4kg  Vital Signs: Temp: 97.6 F (36.4 C) (11/27 0726) Temp Source: Oral (11/27 0726) BP: 112/58 (11/27 0726) Pulse Rate: 101 (11/27 0726)  Labs:  Recent Labs  04/17/16 2155 04/18/16 0525 04/18/16 1124  HGB 14.8 12.9  --   HCT 43.4 38.9  --   PLT 124* 110*  --   CREATININE 0.82 1.25*  --   TROPONINI 0.11* 0.31* 0.31*    Estimated Creatinine Clearance: 39.8 mL/min (by C-G formula based on SCr of 1.25 mg/dL (H)).   Medical History: Past Medical History:  Diagnosis Date  . Acute peptic ulcer, unspecified site, with hemorrhage and perforation, without mention of obstruction   . Arthritis   . Asthma   . Balance problems    unsteady on feet at time  . Chronic airway obstruction, not elsewhere classified    on inhalers  . Diverticulosis of colon (without mention of hemorrhage)   . Hematochezia   . 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 Ventral hernia     Medications:    Assessment:  Goal of Therapy:   Monitor platelets by anticoagulation protocol: Yes   Plan:  Continue Enoxaparin 1 mg/kg q 12 hours ordered. F/u labs per protocol.  Loree Fee, PharmD 04/18/2016,1:19 PM

## 2016-04-18 NOTE — Progress Notes (Signed)
Pharmacy Antibiotic Note  Susan [name] is a 75 y.o. female admitted on 04/17/2016 with pneumonia.  Pharmacy has been consulted for ceftriaxone and azithromycin dosing.  Plan: Ceftriaxone 1 gram q 24 hours and azithromycin 500 mg q 24 hours ordered.  Height: 5' (152.4 cm) Weight: 200 lb (90.7 kg) IBW/kg (Calculated) : 45.5  Temp (24hrs), Avg:99.7 F (37.6 C), Min:99.7 F (37.6 C), Max:99.7 F (37.6 C)   Recent Labs Lab 04/17/16 2155  WBC 1.1*  CREATININE 0.82  LATICACIDVEN 2.1*    Estimated Creatinine Clearance: 60.4 mL/min (by C-G formula based on SCr of 0.82 mg/dL).    No Known Allergies  Antimicrobials this admission: ceftriaxone 11/26 >>  azithromycin 11/27 >>   Dose adjustments this admission:   Microbiology results: 11/36 BCx: pending 11/27 Sputum: pending     11/26 CXR: interstitial edema vs. atypical pneumonia  Thank you for allowing pharmacy to be a part of this patient's care.  Jalexia Lalli S 04/18/2016 1:16 AM

## 2016-04-18 NOTE — H&P (Signed)
[place] @ [facility] Admission History and Physical Harvie Bridge, D.O.  ---------------------------------------------------------------------------------------------------------------------   PATIENT NAMEBethanny [name] MR#: [id number] DATE OF BIRTH: 1940/07/27 DATE OF ADMISSION: 04/17/2016 PRIMARY CARE PHYSICIAN: Eliezer Lofts, MD  REQUESTING/REFERRING PHYSICIAN: ED Dr. Corky Downs  CHIEF COMPLAINT: Chief Complaint  Patient presents with  . Respiratory Distress    HISTORY OF PRESENT ILLNESS: Susan [name] is a 75 y.o. female with a known history of Asthma, arthritis, COPD presents to the emergency department complaining of respiratory distress. EMS reports history of COPD they found her in significant distress and started her on BiPAP.  Otherwise there has been no change in status. Patient has been taking medication as prescribed and there has been no recent change in medication or diet.  There has been no recent illness, travel or sick contacts.    Patient denies fevers/chills, weakness, dizziness, chest pain, shortness of breath, N/V/C/D, abdominal pain, dysuria/frequency, changes in mental status.   EMS/ED COURSE:   Patient received Rocephin, a azithromycin, nebulizers, Solu-Medrol and BiPAP.Marland Kitchen  PAST MEDICAL HISTORY: Past Medical History:  Diagnosis Date  . Acute peptic ulcer, unspecified site, with hemorrhage and perforation, without mention of obstruction   . Arthritis   . Asthma   . Balance problems    unsteady on feet at time  . Chronic airway obstruction, not elsewhere classified    on inhalers  . Diverticulosis of colon (without mention of hemorrhage)   . Hematochezia   . 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 Ventral hernia       PAST SURGICAL HISTORY: Past Surgical History:  Procedure Laterality Date  . APPENDECTOMY    . BUNIONECTOMY Bilateral   . COSMETIC SURGERY  1970's   abdomen. Tummy Tuck  . FOOT SURGERY Bilateral dec  2015 right    left 3 yrs ago  . TOTAL HIP ARTHROPLASTY  2007   left  . TOTAL HIP ARTHROPLASTY     bilateral   . TOTAL KNEE ARTHROPLASTY Right 08/08/2014   Procedure: RIGHT TOTAL KNEE ARTHROPLASTY WITH NAVIGATION;  Surgeon: Rod Can, MD;  Location: [facility];  Service: Orthopedics;  Laterality: Right;  . TOTAL KNEE ARTHROPLASTY Left 10/03/2014   Procedure: LEFT TOTAL KNEE ARTHROPLASTY;  Surgeon: Rod Can, MD;  Location: [facility];  Service: Orthopedics;  Laterality: Left;  . TUBAL LIGATION    . UMBILICAL HERNIA REPAIR     2016      SOCIAL HISTORY: Social History  Substance Use Topics  . Smoking status: Former Smoker    Packs/day: 0.50    Years: 40.00    Types: Cigarettes    Quit date: 08/29/2000  . Smokeless tobacco: Never Used  . Alcohol use No     Comment: rare      FAMILY HISTORY: Family History  Problem Relation Age of Onset  . Diabetes Mother   . Coronary artery disease Mother   . Lung disease Mother   . Heart disease Mother     CAD  . Coronary artery disease Father   . Kidney disease Father   . Diabetes Father   . Heart disease Father     CAD  . Cancer Neg Hx      MEDICATIONS AT HOME: Prior to Admission medications   Medication Sig Start Date End Date Taking? Authorizing Provider  acyclovir (ZOVIRAX) 400 MG tablet TAKE ONE TABLET TWICE A DAY 04/13/16  Yes Amy E Bedsole, MD  albuterol (PROVENTIL) (2.5 MG/3ML) 0.083% nebulizer solution Take 3 mLs (  2.5 mg total) by nebulization every 6 (six) hours as needed for wheezing or shortness of breath. 01/11/16  Yes Juanito Doom, MD  aspirin 81 MG tablet Take 81 mg by mouth daily.   Yes Historical Provider, MD  fluticasone (FLONASE) 50 MCG/ACT nasal spray Place 2 sprays into both nostrils daily. 02/03/16  Yes Elby Beck, FNP  ibuprofen (ADVIL,MOTRIN) 200 MG tablet Take 600 mg by mouth every 6 (six) hours as needed.    Yes Historical Provider, MD  Multiple Vitamin (MULTIVITAMIN WITH MINERALS) TABS tablet  Take 1 tablet by mouth daily.   Yes Historical Provider, MD  Probiotic Product (PROBIOTIC PO) Take 1 tablet by mouth daily.   Yes Historical Provider, MD  rOPINIRole (REQUIP) 3 MG tablet Take 1 tablet (3 mg total) by mouth at bedtime. 01/12/16  Yes Amy Cletis Athens, MD  SYMBICORT 160-4.5 MCG/ACT inhaler INHALE 2 PUFFS INTO THE LUNGS 2 (TWO) TIMES DAILY. 02/02/16  Yes Amy Cletis [place], MD  traZODone (DESYREL) 50 MG tablet TAKE ONE AND ONE-HALF TABLETS AT BEDTIME. AS NEEDED FOR SLEEP 10/26/15  Yes Amy E Bedsole, MD  VENTOLIN HFA 108 (90 Base) MCG/ACT inhaler INHALE 2 PUFFS 1 OR 2 MINUTES APART EVERY 6 HOURS AS NEEDED FOR WHEEZING TIGHT COUGH OR SHORTNESS OF BREATH 02/05/16  Yes Amy E Bedsole, MD  traMADol (ULTRAM) 50 MG tablet TAKE 1 OR 2 TABLETS EVERY 6 HOURS AS NEEDED FOR PAIN Patient not taking: Reported on 04/17/2016 09/29/15   Amy E Diona Browner, MD      DRUG ALLERGIES: No Known Allergies   REVIEW OF SYSTEMS: CONSTITUTIONAL: No fatigue, weakness, fever, chills, weight gain/loss, headache EYES: No blurry or double vision. ENT: No tinnitus, postnasal drip, redness or soreness of the oropharynx. RESPIRATORY: Positive dyspnea, cough, wheeze, hemoptysis. CARDIOVASCULAR: No chest pain, orthopnea, palpitations, syncope. GASTROINTESTINAL: No nausea, vomiting, constipation, diarrhea, abdominal pain. No hematemesis, melena or hematochezia. GENITOURINARY: No dysuria, frequency, hematuria. ENDOCRINE: No polyuria or nocturia. No heat or cold intolerance. HEMATOLOGY: No anemia, bruising, bleeding. INTEGUMENTARY: No rashes, ulcers, lesions. MUSCULOSKELETAL: No pain, arthritis, swelling, gout. NEUROLOGIC: No numbness, tingling, weakness or ataxia. No seizure-type activity. PSYCHIATRIC: No anxiety, depression, insomnia.  PHYSICAL EXAMINATION: VITAL SIGNS: Blood pressure (!) 99/57, pulse (!) 113, temperature 98.6 F (37 C), temperature source Oral, resp. rate (!) 24, height 5' (1.524 m), weight 90.7 kg (200 lb),  SpO2 95 %.  GENERAL: 74 y.o.-year-old female patient, well-developed, well-nourished lying in the bed in no acute distress.  Pleasant and cooperative.   HEENT: Head atraumatic, normocephalic. Pupils equal, round, reactive to light and accommodation. No scleral icterus. Extraocular muscles intact. Oropharynx is clear. Mucus membranes moist. NECK: Supple, full range of motion. No JVD, no bruit heard. No cervical lymphadenopathy. CHEST: Decreased breath sounds with diffuse wheezing. No rales, rhonchi or crackles. No use of accessory muscles of respiration.  No reproducible chest wall tenderness.  CARDIOVASCULAR: S1, S2 normal. No murmurs, rubs, or gallops appreciated. Cap refill <2 seconds. ABDOMEN: Soft, nontender, nondistended. No rebound, guarding, rigidity. Normoactive bowel sounds present in all four quadrants. No organomegaly or mass. EXTREMITIES:  No pedal edema, cyanosis, or clubbing. NEUROLOGIC: Cranial nerves II through XII are grossly intact with no focal sensorimotor deficit. Muscle strength 5/5 in all extremities. Sensation intact. Gait not checked. PSYCHIATRIC: The patient is alert and oriented x 3. Normal affect, mood, thought content. SKIN: Warm, dry, and intact without obvious rash, lesion, or ulcer.  LABORATORY PANEL:  CBC  Recent Labs Lab 04/17/16 2155  WBC 1.1*  HGB 14.8  HCT 43.4  PLT 124*   ----------------------------------------------------------------------------------------------------------------- Chemistries  Recent Labs Lab 04/17/16 2155  NA 136  K 4.0  CL 106  CO2 21*  GLUCOSE 127*  BUN 15  CREATININE 0.82  CALCIUM 8.7*  AST 32  ALT 28  ALKPHOS 90  BILITOT 1.3*   ------------------------------------------------------------------------------------------------------------------ Cardiac Enzymes  Recent Labs Lab 04/17/16 2155  TROPONINI 0.11*    ------------------------------------------------------------------------------------------------------------------  RADIOLOGY: Dg Chest Port 1 View  Result Date: 04/17/2016 CLINICAL DATA:  75 y/o  F; shortness of breath. EXAM: PORTABLE CHEST 1 VIEW COMPARISON:  10/02/2015 chest radiograph. FINDINGS: Stable cardiomegaly given projection and technique. Prominent interstitial markings may represent interstitial edema or atypical pneumonia. No focal consolidation. No effusion or pneumothorax. Multilevel degenerative changes of the spine and reverse S curvature. Severe osteoarthrosis of shoulder joints. IMPRESSION: Prominent interstitial markings may represent interstitial edema or atypical pneumonia. Stable cardiomegaly. Electronically Signed   By: Kristine Garbe M.D.   On: 04/17/2016 22:21    EKG: Sinus tachycardia at 165 bpm with leftward axis and nonspecific ST and T wave changes.   IMPRESSION AND PLAN:  This is a 75 y.o. female with a history of Asthma, arthritis, COPD now being admitted with: 1.  Acute respiratory failure with hypoxia likely secondary to COPD exacerbation. Patient is also meeting sepsis criteria, likely secondary to community-acquired pneumonia. She has been stable off BiPAP for several hours now we will therefore admit to inpatient for IV antibiotics, IV fluids, nebulizers, steroids and O2 therapy as needed. Follow blood and sputum cultures. 2. Elevated troponin likely secondary to demand ischemia however we will give one dose of weight-based Lovenox and trend troponins. Check lipids and TSH and request cardiology consultation. Continue aspirin 3.history of allergies and asthma-continue Flonase and Symbicort   Diet/Nutrition: Heart healthy Fluids:  IV normal saline DVT Px: Lovenox, SCDs and early ambulation Code Status: Full  All the records are reviewed and case discussed with ED provider. Management plans discussed with the patient and/or family who express  understanding and agree with plan of care.   TOTAL TIME TAKING CARE OF THIS PATIENT: 60 minutes.   Yovana Scogin D.O. on 04/18/2016 at 1:49 AM Between 7am to 6pm - Pager - [phone] After 6pm go to [email or web address] - [employer] [place] [facility] Office [phone] CC: Primary care physician; Eliezer Lofts, MD     Note: This dictation was prepared with Dragon dictation along with smaller phrase technology. Any transcriptional errors that result from this process are unintentional.

## 2016-04-18 NOTE — Progress Notes (Addendum)
[place] at [place] NAME: Susan [name]    MR#:  [id number]  DATE OF BIRTH:  [birth year]  SUBJECTIVE:  CHIEF COMPLAINT:   Chief Complaint  Patient presents with  . Respiratory Distress  Patient is 75 year old female with past medical history significant for history of Peptic ulcer disease, COPD, arthritis, who presents to the hospital with complaints of respiratory distress, shortness of breath. In emergency room, she was found to be in significant distress and was started on BiPAP. She feels better today after initiation of steroids, antibiotic therapy intravenously. She admits of some cough and thick phlegm production. Chest x-ray revealed possible interstitial pneumonia versus interstitial edema. The patient was initiated on Lasix, creatinine worsened to 1.25 with diuresis. The patient feels better today.   Review of Systems  Constitutional: Negative for chills, fever and weight loss.  HENT: Negative for congestion.   Eyes: Negative for blurred vision and double vision.  Respiratory: Positive for cough, sputum production and shortness of breath. Negative for wheezing.   Cardiovascular: Negative for chest pain, palpitations, orthopnea, leg swelling and PND.  Gastrointestinal: Negative for abdominal pain, blood in stool, constipation, diarrhea, nausea and vomiting.  Genitourinary: Negative for dysuria, frequency, hematuria and urgency.  Musculoskeletal: Negative for falls.  Neurological: Negative for dizziness, tremors, focal weakness and headaches.  Endo/Heme/Allergies: Does not bruise/bleed easily.  Psychiatric/Behavioral: Negative for depression. The patient does not have insomnia.     VITAL SIGNS: Blood pressure (!) 112/58, pulse (!) 101, temperature 97.6 F (36.4 C), temperature source Oral, resp. rate 20, height 5' (1.524 m), weight 91.4 kg (201 lb 6.4 oz), SpO2 96 %.  PHYSICAL EXAMINATION:   GENERAL:  75 y.o.-year-old  patient lying in the bed in mild respiratory  Distress, intermittently tachypneic.  EYES: Pupils equal, round, reactive to light and accommodation. No scleral icterus. Extraocular muscles intact.  HEENT: Head atraumatic, normocephalic. Oropharynx and nasopharynx clear.  NECK:  Supple, no jugular venous distention. No thyroid enlargement, no tenderness.  LUNGS: Normal breath sounds bilaterally, no wheezing, rales,rhonchi or crepitation. Intermittent use of accessory muscles of respiration, with speech and movements.  CARDIOVASCULAR: S1, S2 tachycardic. No murmurs, rubs, or gallops.  ABDOMEN: Soft, mild discomfort diffusely, no rebound, guarding, nondistended. Bowel sounds present. No organomegaly or mass.  EXTREMITIES:1+ lower extremity and pedal  edema, no cyanosis, or clubbing.  NEUROLOGIC: Cranial nerves II through XII are intact. Muscle strength 5/5 in all extremities. Sensation intact. Gait not checked.  PSYCHIATRIC: The patient is alert and oriented x 3.  SKIN: No obvious rash, lesion, or ulcer.   ORDERS/RESULTS REVIEWED:   CBC  Recent Labs Lab 04/17/16 2155 04/18/16 0525  WBC 1.1* 13.5*  HGB 14.8 12.9  HCT 43.4 38.9  PLT 124* 110*  MCV 90.2 90.3  MCH 30.7 30.0  MCHC 34.0 33.2  RDW 14.2 14.0  LYMPHSABS 0.3*  --   MONOABS 0.0*  --   EOSABS 0.0  --   BASOSABS 0.0  --    ------------------------------------------------------------------------------------------------------------------  Chemistries   Recent Labs Lab 04/17/16 2155 04/18/16 0525  NA 136 138  K 4.0 3.3*  CL 106 110  CO2 21* 18*  GLUCOSE 127* 212*  BUN 15 16  CREATININE 0.82 1.25*  CALCIUM 8.7* 8.0*  AST 32  --   ALT 28  --   ALKPHOS 90  --   BILITOT 1.3*  --    ------------------------------------------------------------------------------------------------------------------ estimated creatinine clearance is 39.8 mL/min (by C-G formula based on  SCr of 1.25 mg/dL  (H)). ------------------------------------------------------------------------------------------------------------------  Recent Labs  04/18/16 0525  TSH 0.813    Cardiac Enzymes  Recent Labs Lab 04/17/16 2155 04/18/16 0525 04/18/16 1124  TROPONINI 0.11* 0.31* 0.31*   ------------------------------------------------------------------------------------------------------------------ Invalid input(s): POCBNP ---------------------------------------------------------------------------------------------------------------  RADIOLOGY: US Renal  Result Date: 04/18/2016 CLINICAL DATA:  Two months of dyspnea EXAM: RENAL / URINARY TRACT ULTRASOUND COMPLETE COMPARISON:  None in PACs FINDINGS: The study is somewhat limited due to the patient's inability to follow breathing instructions Right Kidney: Length: 10.8 cm. Echogenicity within normal limits. No mass or hydronephrosis visualized. Left Kidney: Length: 11.5 cm. Echogenicity within normal limits. No mass visualized. There is minimal splitting of the central echo complex but no significant hydronephrosis. Bladder: Nonvisualized due to recent voiding by the patient. Incidental note is made of a shadowing gallstone measuring 3.2 cm in diameter. No sonographic evidence of acute cholecystitis was observed. IMPRESSION: 1. No acute abnormality of either kidney. Possible trace of hydronephrosis on the left. Nonvisualization of the urinary bladder. 2. Gallstone without sonographic evidence of acute cholecystitis. Electronically Signed   By: David  Martinique M.D.   On: 04/18/2016 11:12   Dg Chest Port 1 View  Result Date: 04/17/2016 CLINICAL DATA:  75 y/o  F; shortness of breath. EXAM: PORTABLE CHEST 1 VIEW COMPARISON:  10/02/2015 chest radiograph. FINDINGS: Stable cardiomegaly given projection and technique. Prominent interstitial markings may represent interstitial edema or atypical pneumonia. No focal consolidation. No effusion or pneumothorax. Multilevel  degenerative changes of the spine and reverse S curvature. Severe osteoarthrosis of shoulder joints. IMPRESSION: Prominent interstitial markings may represent interstitial edema or atypical pneumonia. Stable cardiomegaly. Electronically Signed   By: Kristine Garbe M.D.   On: 04/17/2016 22:21    EKG:  Orders placed or performed during the hospital encounter of 04/17/16  . EKG 12-Lead  . EKG 12-Lead    ASSESSMENT AND PLAN:  Active Problems:   Acute respiratory failure with hypoxia (HCC) #1. Sepsis due to Escherichia coli, discontinue Rocephin, initiate meropenem, follow closely clinically, continue IVF at high rate #2 Acute respiratory failure with hypoxia due to sepsis, unlikely pneumonia, continue oxygen therapy as needed, zithromax for now #3. Acute pyelonephritis, continue meropenem, get urine culture, ultrasound of kidneys did not show significant hydronephrosis #4. Leukopenia, resolved #5. Hypokalemia, supplement  orally, get magnesium level and supplement if needed #6. Elevated troponin, likely demand ischemia, echocardiogram in November 2017, unremarkable but calcified aortic annulus. Discontinue treatment Lovenox dose.   Management plans discussed with the patient, family and they are in agreement.   DRUG ALLERGIES: No Known Allergies  CODE STATUS:     Code Status Orders        Start     Ordered   04/18/16 0154  Full code  Continuous     04/18/16 0153    Code Status History    Date Active Date Inactive Code Status Order ID Comments User Context   10/03/2014  2:39 PM 10/05/2014  4:49 PM Full Code [id number]  Rod Can, MD Inpatient   08/08/2014 12:22 PM 08/10/2014  6:00 PM Full Code [id number]  Rod Can, MD Inpatient    Advance Directive Documentation   Flowsheet Row Most Recent Value  Type of Advance Directive  Living will, Healthcare Power of Attorney  Pre-existing out of facility DNR order (yellow form or pink MOST form)  No data  "MOST" Form in  Place?  No data      TOTAL TIME TAKING CARE OF THIS PATIENT: 40  minutes.  Theodoro Grist M.D on 04/18/2016 at 2:26 PM  Between 7am to 6pm - Pager - [phone]  After 6pm go to [email or web address] - password [email or web address] [facility]  [place] [facility]  Office  [phone]  CC: Primary care physician; Eliezer Lofts, MD

## 2016-04-18 NOTE — Progress Notes (Signed)
Pt has been admitted to unit. Alert and oriented, VS stable. Dyspnea and SOB while ambulating to restroom. Pt now resting in bed, resting at 97% on 4 L nasal cannula. Troponin Labs 0.31 this Am, MD made aware.

## 2016-04-18 NOTE — ED Notes (Signed)
Report called to ICE. Requested by ICU charge RN to have admitting MD reevaluate pt placement. Dr Ara Kussmaul paged and will reeassess pt for place on regular medical floor. Pt and family informed and understanding of delay.

## 2016-04-18 NOTE — Progress Notes (Signed)
ANTICOAGULATION CONSULT NOTE - Initial Consult  Pharmacy Consult for enoxaparin Indication: ACS / NSTEMI / CP  No Known Allergies  Patient Measurements: Height: 5' (152.4 cm) Weight: 201 lb 6.4 oz (91.4 kg) IBW/kg (Calculated) : 45.5 Enoxaparin Dosing Weight: 91.4kg  Vital Signs: Temp: 97.5 F (36.4 C) (11/27 0320) Temp Source: Oral (11/27 0320) BP: 118/66 (11/27 0320) Pulse Rate: 107 (11/27 0320)  Labs:  Recent Labs  04/17/16 2155  HGB 14.8  HCT 43.4  PLT 124*  CREATININE 0.82  TROPONINI 0.11*    Estimated Creatinine Clearance: 60.7 mL/min (by C-G formula based on SCr of 0.82 mg/dL).   Medical History: Past Medical History:  Diagnosis Date  . Acute peptic ulcer, unspecified site, with hemorrhage and perforation, without mention of obstruction   . Arthritis   . Asthma   . Balance problems    unsteady on feet at time  . Chronic airway obstruction, not elsewhere classified    on inhalers  . Diverticulosis of colon (without mention of hemorrhage)   . Hematochezia   . 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 Ventral hernia     Medications:    Assessment:  Goal of Therapy:   Monitor platelets by anticoagulation protocol: Yes   Plan:  Enoxaparin 1 mg/kg q 12 hours ordered. F/u labs per protocol.  Trigg Delarocha S 04/18/2016,4:16 AM

## 2016-04-19 ENCOUNTER — Inpatient Hospital Stay: Payer: Medicare Other

## 2016-04-19 LAB — CBC
HCT: 36 % (ref 35.0–47.0)
Hemoglobin: 12.2 g/dL (ref 12.0–16.0)
MCH: 31 pg (ref 26.0–34.0)
MCHC: 34 g/dL (ref 32.0–36.0)
MCV: 91 fL (ref 80.0–100.0)
PLATELETS: 101 10*3/uL — AB (ref 150–440)
RBC: 3.95 MIL/uL (ref 3.80–5.20)
RDW: 14.7 % — AB (ref 11.5–14.5)
WBC: 25.5 10*3/uL — ABNORMAL HIGH (ref 3.6–11.0)

## 2016-04-19 MED ORDER — METHYLPREDNISOLONE SODIUM SUCC 125 MG IJ SOLR
60.0000 mg | INTRAMUSCULAR | Status: DC
  Administered 2016-04-19 – 2016-04-20 (×2): 60 mg via INTRAVENOUS
  Filled 2016-04-19 (×2): qty 2

## 2016-04-19 MED ORDER — MEROPENEM-SODIUM CHLORIDE 1 GM/50ML IV SOLR
1.0000 g | Freq: Three times a day (TID) | INTRAVENOUS | Status: DC
  Administered 2016-04-19 – 2016-04-21 (×6): 1 g via INTRAVENOUS
  Filled 2016-04-19 (×10): qty 50

## 2016-04-19 MED ORDER — MEROPENEM-SODIUM CHLORIDE 1 GM/50ML IV SOLR
1.0000 g | Freq: Two times a day (BID) | INTRAVENOUS | Status: DC
  Filled 2016-04-19: qty 50

## 2016-04-19 NOTE — Progress Notes (Signed)
Pharmacy Antibiotic Note  Susan [name] is a 75 y.o. female admitted on 04/17/2016 with pneumonia.  Pharmacy has been consulted for meropenem dosing.  Blood cultures with GNR, BCID: E. Coli and klebsiella pneumoniae  Plan: Current orders for meropenem 1gm IV Q12H, will increase to 1gm IV Q8H Continue to monitor and adjust for renal function, deescalate based on final cultures.   Also ordered azithromycin 500mg  IV Q24H  Height: 5' (152.4 cm) Weight: 201 lb 6.4 oz (91.4 kg) IBW/kg (Calculated) : 45.5  Temp (24hrs), Avg:97.6 F (36.4 C), Min:97.4 F (36.3 C), Max:97.8 F (36.6 C)   Recent Labs Lab 04/17/16 2155 04/18/16 0107 04/18/16 0525 04/18/16 1745 04/19/16 0440  WBC 1.1*  --  13.5*  --  25.5*  CREATININE 0.82  --  1.25* 0.92  --   LATICACIDVEN 2.1* 2.4*  --   --   --     Estimated Creatinine Clearance: 54.1 mL/min (by C-G formula based on SCr of 0.92 mg/dL).    No Known Allergies  Antimicrobials this admission:  Azith 11/26 >> CTX 11/26 >> 11/27 Meropenem 11/27 >>  Dose adjustments this admission:    Microbiology results:  11/26 BCx: enterobacteriacae species, E. Coli, klebsiella 11/27 Urine Sputum:  Thank you for allowing pharmacy to be a part of this patient's care.  Ramonda Galyon C 04/19/2016 11:39 AM

## 2016-04-19 NOTE — Progress Notes (Signed)
[facility] NAME: Susan [name]    MR#:  [id number]  DATE OF BIRTH:  14-Aug-1940  SUBJECTIVE:  CHIEF COMPLAINT:   Chief Complaint  Patient presents with  . Respiratory Distress  Patient is 75 year old female with past medical history significant for history of Peptic ulcer disease, COPD, arthritis, who presents to the hospital with complaints of respiratory distress, shortness of breath. In emergency room, she was found to be in significant distress and was started on BiPAP. She feels better today after initiation of steroids, antibiotic therapy intravenously. She admits of some cough and thick phlegm production. Chest x-ray revealed possible interstitial pneumonia versus interstitial edema. The patient was initiated on Lasix, creatinine worsened to 1.25 with diuresis. Lasix was stopped, creatinine has improved, The patient feels satisfactory today, complains of significant shortness of breath on exertion. While the cell count has increased to 25.5 thousand, platelets are lower than it was yesterday.  Review of Systems  Constitutional: Negative for chills, fever and weight loss.  HENT: Negative for congestion.   Eyes: Negative for blurred vision and double vision.  Respiratory: Positive for cough, sputum production and shortness of breath. Negative for wheezing.   Cardiovascular: Negative for chest pain, palpitations, orthopnea, leg swelling and PND.  Gastrointestinal: Negative for abdominal pain, blood in stool, constipation, diarrhea, nausea and vomiting.  Genitourinary: Negative for dysuria, frequency, hematuria and urgency.  Musculoskeletal: Negative for falls.  Neurological: Negative for dizziness, tremors, focal weakness and headaches.  Endo/Heme/Allergies: Does not bruise/bleed easily.  Psychiatric/Behavioral: Negative for depression. The patient does not have insomnia.     VITAL SIGNS: Blood pressure (!) 154/91, pulse 89,  temperature 98.9 F (37.2 C), resp. rate (!) 22, height 5' (1.524 m), weight 91.4 kg (201 lb 6.4 oz), SpO2 96 %.  PHYSICAL EXAMINATION:   GENERAL:  75 y.o.-year-old patient lying in the bed in moderate respiratory  Distress, tachypneic with exertion, and very uncomfortable.  EYES: Pupils equal, round, reactive to light and accommodation. No scleral icterus. Extraocular muscles intact.  HEENT: Head atraumatic, normocephalic. Oropharynx and nasopharynx clear.  NECK:  Supple, no jugular venous distention. No thyroid enlargement, no tenderness.  LUNGS: Normal breath sounds bilaterally, but diminished at bases, with scattered wheezing, rales,rhonchi , but no crepitations. Using accessory muscles of respiration  with speech and movements, I was able to observed her while she was walking and she was in extreme discomfort and distress.  CARDIOVASCULAR: S1, S2 tachycardic. No murmurs, rubs, or gallops.  ABDOMEN: Soft, no pain,  no rebound, guarding, nondistended. Bowel sounds present. No organomegaly or mass.  EXTREMITIES:1+ lower extremity and pedal  edema, no cyanosis, or clubbing.  NEUROLOGIC: Cranial nerves II through XII are intact. Muscle strength 5/5 in all extremities. Sensation intact. Gait not checked.  PSYCHIATRIC: The patient is alert and oriented x 3.  SKIN: No obvious rash, lesion, or ulcer.   ORDERS/RESULTS REVIEWED:   CBC  Recent Labs Lab 04/17/16 2155 04/18/16 0525 04/19/16 0440  WBC 1.1* 13.5* 25.5*  HGB 14.8 12.9 12.2  HCT 43.4 38.9 36.0  PLT 124* 110* 101*  MCV 90.2 90.3 91.0  MCH 30.7 30.0 31.0  MCHC 34.0 33.2 34.0  RDW 14.2 14.0 14.7*  LYMPHSABS 0.3*  --   --   MONOABS 0.0*  --   --   EOSABS 0.0  --   --   BASOSABS 0.0  --   --    ------------------------------------------------------------------------------------------------------------------  Chemistries   Recent  Labs Lab 04/17/16 2155 04/18/16 0525 04/18/16 1605 04/18/16 1745  NA 136 138  --  140  K  4.0 3.3*  --  3.7  CL 106 110  --  108  CO2 21* 18*  --  23  GLUCOSE 127* 212*  --  121*  BUN 15 16  --  22*  CREATININE 0.82 1.25*  --  0.92  CALCIUM 8.7* 8.0*  --  8.5*  MG  --   --  1.9  --   AST 32  --   --  39  ALT 28  --   --  30  ALKPHOS 90  --   --  60  BILITOT 1.3*  --   --  0.8   ------------------------------------------------------------------------------------------------------------------ estimated creatinine clearance is 54.1 mL/min (by C-G formula based on SCr of 0.92 mg/dL). ------------------------------------------------------------------------------------------------------------------  Recent Labs  04/18/16 0525  TSH 0.813    Cardiac Enzymes  Recent Labs Lab 04/18/16 0525 04/18/16 1124 04/18/16 1605  TROPONINI 0.31* 0.31* 0.30*   ------------------------------------------------------------------------------------------------------------------ Invalid input(s): POCBNP ---------------------------------------------------------------------------------------------------------------  RADIOLOGY: Dg Chest 2 View  Result Date: 04/19/2016 CLINICAL DATA:  Asthma and respiratory distress very EXAM: CHEST  2 VIEW COMPARISON:  04/17/2016 FINDINGS: The heart is moderately enlarged. Pulmonary vascularity is within normal limits. There is a linear and ill-defined opacity in the right mid lung zone extending from the right hilum towards the periphery of the right hemi thorax. It has become more prominent than on the prior study. This most likely represents volume loss however a coal less Ing airspace disease is not excluded. No pneumothorax. No pleural effusion. IMPRESSION: Right mid lung atelectasis versus early airspace disease. Electronically Signed   By: Marybelle Killings M.D.   On: 04/19/2016 12:14   US Renal  Result Date: 04/18/2016 CLINICAL DATA:  Two months of dyspnea EXAM: RENAL / URINARY TRACT ULTRASOUND COMPLETE COMPARISON:  None in PACs FINDINGS: The study is  somewhat limited due to the patient's inability to follow breathing instructions Right Kidney: Length: 10.8 cm. Echogenicity within normal limits. No mass or hydronephrosis visualized. Left Kidney: Length: 11.5 cm. Echogenicity within normal limits. No mass visualized. There is minimal splitting of the central echo complex but no significant hydronephrosis. Bladder: Nonvisualized due to recent voiding by the patient. Incidental note is made of a shadowing gallstone measuring 3.2 cm in diameter. No sonographic evidence of acute cholecystitis was observed. IMPRESSION: 1. No acute abnormality of either kidney. Possible trace of hydronephrosis on the left. Nonvisualization of the urinary bladder. 2. Gallstone without sonographic evidence of acute cholecystitis. Electronically Signed   By: David  Martinique M.D.   On: 04/18/2016 11:12   Dg Chest Port 1 View  Result Date: 04/17/2016 CLINICAL DATA:  75 y/o  F; shortness of breath. EXAM: PORTABLE CHEST 1 VIEW COMPARISON:  10/02/2015 chest radiograph. FINDINGS: Stable cardiomegaly given projection and technique. Prominent interstitial markings may represent interstitial edema or atypical pneumonia. No focal consolidation. No effusion or pneumothorax. Multilevel degenerative changes of the spine and reverse S curvature. Severe osteoarthrosis of shoulder joints. IMPRESSION: Prominent interstitial markings may represent interstitial edema or atypical pneumonia. Stable cardiomegaly. Electronically Signed   By: Kristine Garbe M.D.   On: 04/17/2016 22:21    EKG:  Orders placed or performed during the hospital encounter of 04/17/16  . EKG 12-Lead  . EKG 12-Lead    ASSESSMENT AND PLAN:  Active Problems:   Acute respiratory failure with hypoxia (HCC) #1. Sepsis due to Escherichia coli and Klebsiella pneumonia,  now off Rocephin, continue meropenem, follow closely clinically #2 Acute respiratory failure with hypoxia due to sepsis, ?pneumonia, continue oxygen  therapy as needed, zithromax for now, meropenem, adding steroids due to COPD exacerbation, chest x-ray showed no fluid retention, but atelectasis #3. Acute pyelonephritis, continue meropenem, awaiting for urine culture, ultrasound of kidneys did not show hydronephrosis #4. Leukopenia, resolved, no leukocytosis, likely steroid mediated, follow closely #5. Hypokalemia, supplemented  orally, resolved, magnesium level was normal #6. Elevated troponin, likely demand ischemia, echocardiogram in November 2017, unremarkable but calcified aortic annulus. Continue prophylactic Lovenox dose. Heart rate has improved with conservative therapy  Management plans discussed with the patient, family and they are in agreement.   DRUG ALLERGIES: No Known Allergies  CODE STATUS:     Code Status Orders        Start     Ordered   04/18/16 0154  Full code  Continuous     04/18/16 0153    Code Status History    Date Active Date Inactive Code Status Order ID Comments User Context   10/03/2014  2:39 PM 10/05/2014  4:49 PM Full Code [id number]  Rod Can, MD Inpatient   08/08/2014 12:22 PM 08/10/2014  6:00 PM Full Code [id number]  Rod Can, MD Inpatient    Advance Directive Documentation   Flowsheet Row Most Recent Value  Type of Advance Directive  Living will, Healthcare Power of Attorney  Pre-existing out of facility DNR order (yellow form or pink MOST form)  No data  "MOST" Form in Place?  No data      TOTAL TIME TAKING CARE OF THIS PATIENT: 35 minutes.    Theodoro Grist M.D on 04/19/2016 at 4:10 PM  Between 7am to 6pm - Pager - [phone]  After 6pm go to [email or web address] - password [email or web address] [email or web address]  [facility]  Office  [phone]  CC: Primary care physician; Eliezer Lofts, MD

## 2016-04-19 NOTE — Consult Note (Signed)
[facility] Infectious Disease     Reason for Consult: Gram negative   Referring Physician: Theodoro Grist Date of Admission:  04/17/2016   Active Problems:   Acute respiratory failure with hypoxia (HCC)  HPI: Susan [name] is a 75 y.o. female admitted with sob, fever, leukocytosis, and UA with TNTC WBC. Started on IV abx and BCX + Klebsiella, E coli.   She does report she has had a few weeks of increasing urinary freq and irritation. Had not sought care for it though.  Currently feels better but still sob   Past Medical History:  Diagnosis Date  . Acute peptic ulcer, unspecified site, with hemorrhage and perforation, without mention of obstruction   . Arthritis   . Asthma   . Balance problems    unsteady on feet at time  . Chronic airway obstruction, not elsewhere classified    on inhalers  . Diverticulosis of colon (without mention of hemorrhage)   . Hematochezia   . 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 Ventral hernia    Past Surgical History:  Procedure Laterality Date  . APPENDECTOMY    . BUNIONECTOMY Bilateral   . COSMETIC SURGERY  1970's   abdomen. Tummy Tuck  . FOOT SURGERY Bilateral dec 2015 right    left 3 yrs ago  . TOTAL HIP ARTHROPLASTY  2007   left  . TOTAL HIP ARTHROPLASTY     bilateral   . TOTAL KNEE ARTHROPLASTY Right 08/08/2014   Procedure: RIGHT TOTAL KNEE ARTHROPLASTY WITH NAVIGATION;  Surgeon: Rod Can, MD;  Location: [facility];  Service: Orthopedics;  Laterality: Right;  . TOTAL KNEE ARTHROPLASTY Left 10/03/2014   Procedure: LEFT TOTAL KNEE ARTHROPLASTY;  Surgeon: Rod Can, MD;  Location: [facility];  Service: Orthopedics;  Laterality: Left;  . TUBAL LIGATION    . UMBILICAL HERNIA REPAIR     2016   Social History  Substance Use Topics  . Smoking status: Former Smoker    Packs/day: 0.50    Years: 40.00    Types: Cigarettes    Quit date: 08/29/2000  . Smokeless tobacco: Never Used  . Alcohol use No     Comment: rare    Family History  Problem Relation Age of Onset  . Diabetes Mother   . Coronary artery disease Mother   . Lung disease Mother   . Heart disease Mother     CAD  . Coronary artery disease Father   . Kidney disease Father   . Diabetes Father   . Heart disease Father     CAD  . Cancer Neg Hx     Allergies: No Known Allergies  Current antibiotics: Antibiotics Given (last 72 hours)    Date/Time Action Medication Dose Rate   04/17/16 2348 Given   cefTRIAXone (ROCEPHIN) IVPB 1 g 1 g 100 mL/hr   04/18/16 0300 Given   acyclovir (ZOVIRAX) 200 MG capsule 400 mg 400 mg    04/18/16 0947 Given   acyclovir (ZOVIRAX) 200 MG capsule 400 mg 400 mg    04/18/16 2102 Given   acyclovir (ZOVIRAX) 200 MG capsule 400 mg 400 mg    04/18/16 2104 Given   meropenem (MERREM) 1 g in sodium chloride 0.9 % 100 mL IVPB 1 g 200 mL/hr   04/19/16 0837 Given   acyclovir (ZOVIRAX) 200 MG capsule 400 mg 400 mg    04/19/16 0838 Given   azithromycin (ZITHROMAX) 500 mg in dextrose 5 % 250 mL IVPB 500 mg 250  mL/hr   04/19/16 0838 Given   meropenem (MERREM) 1 g in sodium chloride 0.9 % 100 mL IVPB 1 g 200 mL/hr   04/19/16 1320 Given   meropenem (MERREM) IVPB SOLR 1 g 1 g 100 mL/hr      MEDICATIONS: . acidophilus  1 capsule Oral Daily  . acyclovir  400 mg Oral BID  . aspirin  81 mg Oral Daily  . azithromycin  500 mg Intravenous Q24H  . chlorhexidine  15 mL Mouth Rinse BID  . dextromethorphan  30 mg Oral BID   And  . guaiFENesin  600 mg Oral BID  . enoxaparin (LOVENOX) injection  40 mg Subcutaneous Q24H  . fluticasone  2 spray Each Nare Daily  . mouth rinse  15 mL Mouth Rinse q12n4p  . meropenem  1 g Intravenous Q8H  . methylPREDNISolone (SOLU-MEDROL) injection  60 mg Intravenous Q24H  . mometasone-formoterol  2 puff Inhalation BID  . multivitamin with minerals  1 tablet Oral Daily  . rOPINIRole  3 mg Oral QHS  . sodium chloride flush  3 mL Intravenous Q12H   Review of Systems - 11 systems reviewed and  negative per HPI  OBJECTIVE: Temp:  [97.4 F (36.3 C)-97.8 F (36.6 C)] 97.7 F (36.5 C) (11/28 0756) Pulse Rate:  [86-101] 86 (11/28 0756) Resp:  [20-24] 22 (11/28 0756) BP: (121-155)/(67-83) 132/67 (11/28 0756) SpO2:  [93 %-100 %] 100 % (11/28 0756) Physical Exam  Constitutional:  oriented to person, place, and time.Obese, on O2 HENT: [place]/AT, PERRLA, no scleral icterus Mouth/Throat: Oropharynx is clear and moist. No oropharyngeal exudate.  Cardiovascular: Normal rate, regular rhythm and normal heart sounds. Exam reveals no gallop and no friction rub.  No murmur heard.  Pulmonary/Chest: Ppor air movement, + wheeze Neck = supple, no nuchal rigidity Abdominal: Soft. obese Bowel sounds are normal.  exhibits no distension. There is no tenderness.  Lymphadenopathy: no cervical adenopathy. No axillary adenopathy Neurological: alert and oriented to person, place, and time.  Skin: Skin is warm and dry. No rash noted. No erythema.  Psychiatric: a normal mood and affect.  behavior is normal.    LABS: Results for orders placed or performed during the hospital encounter of 04/17/16 (from the past 48 hour(s))  Blood gas, arterial     Status: Abnormal   Collection Time: 04/17/16  9:48 PM  Result Value Ref Range   FIO2 0.40    Delivery systems BILEVEL POSITIVE AIRWAY PRESSURE    Inspiratory PAP 12    Expiratory PAP 6    pH, Arterial 7.46 (H) 7.350 - 7.450   pCO2 arterial 27 (L) 32.0 - 48.0 mmHg   pO2, Arterial 246 (H) 83.0 - 108.0 mmHg   Bicarbonate 19.2 (L) 20.0 - 28.0 mmol/L   Acid-base deficit 3.0 (H) 0.0 - 2.0 mmol/L   O2 Saturation 99.9 %   Patient temperature 37.0    Collection site RIGHT RADIAL    Sample type ARTERIAL DRAW    Allens test (pass/fail) PASS PASS  CBC with Differential     Status: Abnormal   Collection Time: 04/17/16  9:55 PM  Result Value Ref Range   WBC 1.1 (LL) 3.6 - 11.0 K/uL    Comment: RESULT REPEATED AND VERIFIED CRITICAL RESULT CALLED TO, READ BACK BY  AND VERIFIED WITH: MEGAN JONES AT 2240 04/17/16.PMH    RBC 4.81 3.80 - 5.20 MIL/uL   Hemoglobin 14.8 12.0 - 16.0 g/dL   HCT 43.4 35.0 - 47.0 %   MCV  90.2 80.0 - 100.0 fL   MCH 30.7 26.0 - 34.0 pg   MCHC 34.0 32.0 - 36.0 g/dL   RDW 14.2 11.5 - 14.5 %   Platelets 124 (L) 150 - 440 K/uL   Neutrophils Relative % 72 %   Neutro Abs 0.8 (L) 1.4 - 6.5 K/uL   Lymphocytes Relative 24 %   Lymphs Abs 0.3 (L) 1.0 - 3.6 K/uL   Monocytes Relative 2 %   Monocytes Absolute 0.0 (L) 0.2 - 0.9 K/uL   Eosinophils Relative 2 %   Eosinophils Absolute 0.0 0 - 0.7 K/uL   Basophils Relative 0 %   Basophils Absolute 0.0 0 - 0.1 K/uL  Comprehensive metabolic panel     Status: Abnormal   Collection Time: 04/17/16  9:55 PM  Result Value Ref Range   Sodium 136 135 - 145 mmol/L   Potassium 4.0 3.5 - 5.1 mmol/L   Chloride 106 101 - 111 mmol/L   CO2 21 (L) 22 - 32 mmol/L   Glucose, Bld 127 (H) 65 - 99 mg/dL   BUN 15 6 - 20 mg/dL   Creatinine, Ser 0.82 0.44 - 1.00 mg/dL   Calcium 8.7 (L) 8.9 - 10.3 mg/dL   Total Protein 7.4 6.5 - 8.1 g/dL   Albumin 4.2 3.5 - 5.0 g/dL   AST 32 15 - 41 U/L   ALT 28 14 - 54 U/L   Alkaline Phosphatase 90 38 - 126 U/L   Total Bilirubin 1.3 (H) 0.3 - 1.2 mg/dL   GFR calc non Af Amer >60 >60 mL/min   GFR calc Af Amer >60 >60 mL/min    Comment: (NOTE) The eGFR has been calculated using the CKD EPI equation. This calculation has not been validated in all clinical situations. eGFR's persistently <60 mL/min signify possible Chronic Kidney Disease.    Anion gap 9 5 - 15  Troponin I     Status: Abnormal   Collection Time: 04/17/16  9:55 PM  Result Value Ref Range   Troponin I 0.11 (HH) <0.03 ng/mL    Comment: CRITICAL RESULT CALLED TO, READ BACK BY AND VERIFIED WITH MEGAN JONES AT 2237 ON 04/17/16 BY SNJ   Blood culture (routine x 2)     Status: None (Preliminary result)   Collection Time: 04/17/16  9:55 PM  Result Value Ref Range   Specimen Description BLOOD RIGHT  ANTECUBITAL    Special Requests      BOTTLES DRAWN AEROBIC AND ANAEROBIC [id number],[id number]   Culture  Setup Time      GRAM NEGATIVE RODS AEROBIC BOTTLE ONLY CRITICAL VALUE NOTED.  VALUE IS CONSISTENT WITH PREVIOUSLY REPORTED AND CALLED VALUE.    Culture      GRAM NEGATIVE RODS CULTURE REINCUBATED FOR BETTER GROWTH Performed at [facility]    Report Status PENDING   Blood culture (routine x 2)     Status: None (Preliminary result)   Collection Time: 04/17/16  9:55 PM  Result Value Ref Range   Specimen Description BLOOD BLOOD LEFT HAND    Special Requests      BOTTLES DRAWN AEROBIC AND ANAEROBIC [id number],[id number]   Culture  Setup Time      GRAM NEGATIVE RODS IN BOTH AEROBIC AND ANAEROBIC BOTTLES CRITICAL RESULT CALLED TO, READ BACK BY AND VERIFIED WITH: HANK ZOMPA @ 1245 04/18/2016 QSD    Culture      GRAM NEGATIVE RODS CULTURE REINCUBATED FOR BETTER GROWTH Performed at [facility]    Report Status  PENDING   Brain natriuretic peptide     Status: Abnormal   Collection Time: 04/17/16  9:55 PM  Result Value Ref Range   B Natriuretic Peptide 166.0 (H) 0.0 - 100.0 pg/mL  Lactic acid, plasma     Status: Abnormal   Collection Time: 04/17/16  9:55 PM  Result Value Ref Range   Lactic Acid, Venous 2.1 (HH) 0.5 - 1.9 mmol/L    Comment: CRITICAL RESULT CALLED TO, READ BACK BY AND VERIFIED WITH MEGAN JONES AT 2237 ON 04/17/16 BY SNJ   Blood Culture ID Panel (Reflexed)     Status: Abnormal   Collection Time: 04/17/16  9:55 PM  Result Value Ref Range   Enterococcus species NOT DETECTED NOT DETECTED   Listeria monocytogenes NOT DETECTED NOT DETECTED   Staphylococcus species NOT DETECTED NOT DETECTED   Staphylococcus aureus NOT DETECTED NOT DETECTED   Streptococcus species NOT DETECTED NOT DETECTED   Streptococcus agalactiae NOT DETECTED NOT DETECTED   Streptococcus pneumoniae NOT DETECTED NOT DETECTED   Streptococcus pyogenes NOT DETECTED NOT DETECTED   Acinetobacter  baumannii NOT DETECTED NOT DETECTED   Enterobacteriaceae species DETECTED (A) NOT DETECTED    Comment: CRITICAL RESULT CALLED TO, READ BACK BY AND VERIFIED WITH:  HANK ZOMPA @ 1245 04/18/2016 QSD    Enterobacter cloacae complex NOT DETECTED NOT DETECTED   Escherichia coli DETECTED (A) NOT DETECTED    Comment: CRITICAL RESULT CALLED TO, READ BACK BY AND VERIFIED WITH:  HANK ZOMPA @ 9924 04/18/2016 QSD    Klebsiella oxytoca NOT DETECTED NOT DETECTED   Klebsiella pneumoniae DETECTED (A) NOT DETECTED    Comment: CRITICAL RESULT CALLED TO, READ BACK BY AND VERIFIED WITH: HANK ZOMPA @ 1245 04/18/2016 QSD    Proteus species NOT DETECTED NOT DETECTED   Serratia marcescens NOT DETECTED NOT DETECTED   Carbapenem resistance NOT DETECTED NOT DETECTED   Haemophilus influenzae NOT DETECTED NOT DETECTED   Neisseria meningitidis NOT DETECTED NOT DETECTED   Pseudomonas aeruginosa NOT DETECTED NOT DETECTED   Candida albicans NOT DETECTED NOT DETECTED   Candida glabrata NOT DETECTED NOT DETECTED   Candida krusei NOT DETECTED NOT DETECTED   Candida parapsilosis NOT DETECTED NOT DETECTED   Candida tropicalis NOT DETECTED NOT DETECTED  Lactic acid, plasma     Status: Abnormal   Collection Time: 04/18/16  1:07 AM  Result Value Ref Range   Lactic Acid, Venous 2.4 (HH) 0.5 - 1.9 mmol/L    Comment: CRITICAL RESULT CALLED TO, READ BACK BY AND VERIFIED WITH ANN CALES AT 0138 ON 04/18/16 BY SNJ   Urinalysis complete, with microscopic ([facility] only)     Status: Abnormal   Collection Time: 04/18/16  3:19 AM  Result Value Ref Range   Color, Urine YELLOW (A) YELLOW   APPearance HAZY (A) CLEAR   Glucose, UA NEGATIVE NEGATIVE mg/dL   Bilirubin Urine NEGATIVE NEGATIVE   Ketones, ur NEGATIVE NEGATIVE mg/dL   Specific Gravity, Urine 1.005 1.005 - 1.030   Hgb urine dipstick 2+ (A) NEGATIVE   pH 6.0 5.0 - 8.0   Protein, ur 30 (A) NEGATIVE mg/dL   Nitrite POSITIVE (A) NEGATIVE   Leukocytes, UA 3+ (A) NEGATIVE    RBC / HPF 0-5 0 - 5 RBC/hpf   WBC, UA TOO NUMEROUS TO COUNT 0 - 5 WBC/hpf   Bacteria, UA RARE (A) NONE SEEN   Squamous Epithelial / LPF 0-5 (A) NONE SEEN  Troponin I (q 6hr x 3)     Status: Abnormal  Collection Time: 04/18/16  5:25 AM  Result Value Ref Range   Troponin I 0.31 (HH) <0.03 ng/mL    Comment: CRITICAL VALUE NOTED. VALUE IS CONSISTENT WITH PREVIOUSLY REPORTED/CALLED VALUE SNJ  TSH     Status: None   Collection Time: 04/18/16  5:25 AM  Result Value Ref Range   TSH 0.813 0.350 - 4.500 uIU/mL    Comment: Performed by a 3rd Generation assay with a functional sensitivity of <=0.01 uIU/mL.  Lipid panel     Status: None   Collection Time: 04/18/16  5:25 AM  Result Value Ref Range   Cholesterol 126 0 - 200 mg/dL   Triglycerides 40 <150 mg/dL   HDL 44 >40 mg/dL   Total CHOL/HDL Ratio 2.9 RATIO   VLDL 8 0 - 40 mg/dL   LDL Cholesterol 74 0 - 99 mg/dL    Comment:        Total Cholesterol/HDL:CHD Risk Coronary Heart Disease Risk Table                     Men   Women  1/2 Average Risk   3.4   3.3  Average Risk       5.0   4.4  2 X Average Risk   9.6   7.1  3 X Average Risk  23.4   11.0        Use the calculated Patient Ratio above and the CHD Risk Table to determine the patient's CHD Risk.        ATP III CLASSIFICATION (LDL):  <100     mg/dL   Optimal  100-129  mg/dL   Near or Above                    Optimal  130-159  mg/dL   Borderline  160-189  mg/dL   High  >190     mg/dL   Very High   CBC     Status: Abnormal   Collection Time: 04/18/16  5:25 AM  Result Value Ref Range   WBC 13.5 (H) 3.6 - 11.0 K/uL   RBC 4.31 3.80 - 5.20 MIL/uL   Hemoglobin 12.9 12.0 - 16.0 g/dL   HCT 38.9 35.0 - 47.0 %   MCV 90.3 80.0 - 100.0 fL   MCH 30.0 26.0 - 34.0 pg   MCHC 33.2 32.0 - 36.0 g/dL   RDW 14.0 11.5 - 14.5 %   Platelets 110 (L) 150 - 440 K/uL  Basic metabolic panel     Status: Abnormal   Collection Time: 04/18/16  5:25 AM  Result Value Ref Range   Sodium 138 135 - 145  mmol/L   Potassium 3.3 (L) 3.5 - 5.1 mmol/L   Chloride 110 101 - 111 mmol/L   CO2 18 (L) 22 - 32 mmol/L   Glucose, Bld 212 (H) 65 - 99 mg/dL   BUN 16 6 - 20 mg/dL   Creatinine, Ser 1.25 (H) 0.44 - 1.00 mg/dL   Calcium 8.0 (L) 8.9 - 10.3 mg/dL   GFR calc non Af Amer 41 (L) >60 mL/min   GFR calc Af Amer 48 (L) >60 mL/min    Comment: (NOTE) The eGFR has been calculated using the CKD EPI equation. This calculation has not been validated in all clinical situations. eGFR's persistently <60 mL/min signify possible Chronic Kidney Disease.    Anion gap 10 5 - 15  Troponin I (q 6hr x 3)     Status: Abnormal  Collection Time: 04/18/16 11:24 AM  Result Value Ref Range   Troponin I 0.31 (HH) <0.03 ng/mL    Comment: CRITICAL VALUE NOTED. VALUE IS CONSISTENT WITH PREVIOUSLY REPORTED/CALLED VALUE. SGD  Troponin I (q 6hr x 3)     Status: Abnormal   Collection Time: 04/18/16  4:05 PM  Result Value Ref Range   Troponin I 0.30 (HH) <0.03 ng/mL    Comment: CRITICAL VALUE NOTED. VALUE IS CONSISTENT WITH PREVIOUSLY REPORTED/CALLED VALUE.  TFK  Magnesium     Status: None   Collection Time: 04/18/16  4:05 PM  Result Value Ref Range   Magnesium 1.9 1.7 - 2.4 mg/dL  Comprehensive metabolic panel     Status: Abnormal   Collection Time: 04/18/16  5:45 PM  Result Value Ref Range   Sodium 140 135 - 145 mmol/L   Potassium 3.7 3.5 - 5.1 mmol/L   Chloride 108 101 - 111 mmol/L   CO2 23 22 - 32 mmol/L   Glucose, Bld 121 (H) 65 - 99 mg/dL   BUN 22 (H) 6 - 20 mg/dL   Creatinine, Ser 0.92 0.44 - 1.00 mg/dL   Calcium 8.5 (L) 8.9 - 10.3 mg/dL   Total Protein 6.9 6.5 - 8.1 g/dL   Albumin 3.6 3.5 - 5.0 g/dL   AST 39 15 - 41 U/L   ALT 30 14 - 54 U/L   Alkaline Phosphatase 60 38 - 126 U/L   Total Bilirubin 0.8 0.3 - 1.2 mg/dL   GFR calc non Af Amer 60 (L) >60 mL/min   GFR calc Af Amer >60 >60 mL/min    Comment: (NOTE) The eGFR has been calculated using the CKD EPI equation. This calculation has not been  validated in all clinical situations. eGFR's persistently <60 mL/min signify possible Chronic Kidney Disease.    Anion gap 9 5 - 15  CBC     Status: Abnormal   Collection Time: 04/19/16  4:40 AM  Result Value Ref Range   WBC 25.5 (H) 3.6 - 11.0 K/uL   RBC 3.95 3.80 - 5.20 MIL/uL   Hemoglobin 12.2 12.0 - 16.0 g/dL   HCT 36.0 35.0 - 47.0 %   MCV 91.0 80.0 - 100.0 fL   MCH 31.0 26.0 - 34.0 pg   MCHC 34.0 32.0 - 36.0 g/dL   RDW 14.7 (H) 11.5 - 14.5 %   Platelets 101 (L) 150 - 440 K/uL   No components found for: ESR, C REACTIVE PROTEIN MICRO: Recent Results (from the past 720 hour(s))  Blood culture (routine x 2)     Status: None (Preliminary result)   Collection Time: 04/17/16  9:55 PM  Result Value Ref Range Status   Specimen Description BLOOD RIGHT ANTECUBITAL  Final   Special Requests   Final    BOTTLES DRAWN AEROBIC AND ANAEROBIC [id number],[id number]   Culture  Setup Time   Final    GRAM NEGATIVE RODS AEROBIC BOTTLE ONLY CRITICAL VALUE NOTED.  VALUE IS CONSISTENT WITH PREVIOUSLY REPORTED AND CALLED VALUE.    Culture   Final    GRAM NEGATIVE RODS CULTURE REINCUBATED FOR BETTER GROWTH Performed at [facility]    Report Status PENDING  Incomplete  Blood culture (routine x 2)     Status: None (Preliminary result)   Collection Time: 04/17/16  9:55 PM  Result Value Ref Range Status   Specimen Description BLOOD BLOOD LEFT HAND  Final   Special Requests   Final    BOTTLES DRAWN AEROBIC AND  ANAEROBIC AERB7CC,[id number]   Culture  Setup Time   Final    GRAM NEGATIVE RODS IN BOTH AEROBIC AND ANAEROBIC BOTTLES CRITICAL RESULT CALLED TO, READ BACK BY AND VERIFIED WITH: HANK ZOMPA @ 1245 04/18/2016 QSD    Culture   Final    GRAM NEGATIVE RODS CULTURE REINCUBATED FOR BETTER GROWTH Performed at [facility]    Report Status PENDING  Incomplete  Blood Culture ID Panel (Reflexed)     Status: Abnormal   Collection Time: 04/17/16  9:55 PM  Result Value Ref Range Status    Enterococcus species NOT DETECTED NOT DETECTED Final   Listeria monocytogenes NOT DETECTED NOT DETECTED Final   Staphylococcus species NOT DETECTED NOT DETECTED Final   Staphylococcus aureus NOT DETECTED NOT DETECTED Final   Streptococcus species NOT DETECTED NOT DETECTED Final   Streptococcus agalactiae NOT DETECTED NOT DETECTED Final   Streptococcus pneumoniae NOT DETECTED NOT DETECTED Final   Streptococcus pyogenes NOT DETECTED NOT DETECTED Final   Acinetobacter baumannii NOT DETECTED NOT DETECTED Final   Enterobacteriaceae species DETECTED (A) NOT DETECTED Final    Comment: CRITICAL RESULT CALLED TO, READ BACK BY AND VERIFIED WITH:  HANK ZOMPA @ 1245 04/18/2016 QSD    Enterobacter cloacae complex NOT DETECTED NOT DETECTED Final   Escherichia coli DETECTED (A) NOT DETECTED Final    Comment: CRITICAL RESULT CALLED TO, READ BACK BY AND VERIFIED WITH:  HANK ZOMPA @ 8891 04/18/2016 QSD    Klebsiella oxytoca NOT DETECTED NOT DETECTED Final   Klebsiella pneumoniae DETECTED (A) NOT DETECTED Final    Comment: CRITICAL RESULT CALLED TO, READ BACK BY AND VERIFIED WITH: HANK ZOMPA @ 6945 04/18/2016 QSD    Proteus species NOT DETECTED NOT DETECTED Final   Serratia marcescens NOT DETECTED NOT DETECTED Final   Carbapenem resistance NOT DETECTED NOT DETECTED Final   Haemophilus influenzae NOT DETECTED NOT DETECTED Final   Neisseria meningitidis NOT DETECTED NOT DETECTED Final   Pseudomonas aeruginosa NOT DETECTED NOT DETECTED Final   Candida albicans NOT DETECTED NOT DETECTED Final   Candida glabrata NOT DETECTED NOT DETECTED Final   Candida krusei NOT DETECTED NOT DETECTED Final   Candida parapsilosis NOT DETECTED NOT DETECTED Final   Candida tropicalis NOT DETECTED NOT DETECTED Final    IMAGING: Dg Chest 2 View  Result Date: 04/19/2016 CLINICAL DATA:  Asthma and respiratory distress very EXAM: CHEST  2 VIEW COMPARISON:  04/17/2016 FINDINGS: The heart is moderately enlarged. Pulmonary  vascularity is within normal limits. There is a linear and ill-defined opacity in the right mid lung zone extending from the right hilum towards the periphery of the right hemi thorax. It has become more prominent than on the prior study. This most likely represents volume loss however a coal less Ing airspace disease is not excluded. No pneumothorax. No pleural effusion. IMPRESSION: Right mid lung atelectasis versus early airspace disease. Electronically Signed   By: Marybelle Killings M.D.   On: 04/19/2016 12:14   US Renal  Result Date: 04/18/2016 CLINICAL DATA:  Two months of dyspnea EXAM: RENAL / URINARY TRACT ULTRASOUND COMPLETE COMPARISON:  None in PACs FINDINGS: The study is somewhat limited due to the patient's inability to follow breathing instructions Right Kidney: Length: 10.8 cm. Echogenicity within normal limits. No mass or hydronephrosis visualized. Left Kidney: Length: 11.5 cm. Echogenicity within normal limits. No mass visualized. There is minimal splitting of the central echo complex but no significant hydronephrosis. Bladder: Nonvisualized due to recent voiding by the patient. Incidental  note is made of a shadowing gallstone measuring 3.2 cm in diameter. No sonographic evidence of acute cholecystitis was observed. IMPRESSION: 1. No acute abnormality of either kidney. Possible trace of hydronephrosis on the left. Nonvisualization of the urinary bladder. 2. Gallstone without sonographic evidence of acute cholecystitis. Electronically Signed   By: Ashwin Tibbs  [place] M.D.   On: 04/18/2016 11:12   Ct Chest High Resolution  Result Date: 04/05/2016 CLINICAL DATA:  75 year old female with chronic shortness of breath with exertion and wheezing. Former smoker. EXAM: CT CHEST WITHOUT CONTRAST TECHNIQUE: Multidetector CT imaging of the chest was performed following the standard protocol without intravenous contrast. High resolution imaging of the lungs, as well as inspiratory and expiratory imaging, was  performed. COMPARISON:  09/16/2015 high-resolution chest CT. 10/02/2015 chest radiograph. FINDINGS: Cardiovascular: Normal heart size. Stable trace pericardial effusion/ thickening. Left anterior descending and right coronary atherosclerosis. Atherosclerotic nonaneurysmal thoracic aorta. Normal caliber pulmonary arteries. Mediastinum/Nodes: No discrete thyroid nodules. Unremarkable esophagus. No pathologically enlarged axillary, mediastinal or gross hilar lymph nodes, noting limited sensitivity for the detection of hilar adenopathy on this noncontrast study. Lungs/Pleura: No pneumothorax. No pleural effusion. Subpleural 4 mm superior segment right lower lobe pulmonary nodule (series 5/ image 45), stable since 09/22/2014, considered benign . Subpleural 8 x 4 mm (average diameter 6 mm) posterior right lower lobe pulmonary nodule (series 5/ image 59), stable back to 09/22/2014, considered benign. No acute consolidative airspace disease, lung masses or new significant pulmonary nodules. Moderate patchy air trapping on the expiration sequence is unchanged. Mild patchy subpleural reticulation and ground-glass attenuation in both lungs, asymmetrically involving the right lung, with a slight basilar predominance. Minimal associated traction bronchiolectasis predominantly in the right middle and right lower lobes. No frank honeycombing. Findings have not progressed in the interval and may be slightly less prominent since 09/16/2015. Mild centrilobular and paraseptal emphysema. Upper abdomen: Simple liver cysts, largest 2.0 cm in the right liver lobe. Stable 1.7 cm left adrenal adenoma. Musculoskeletal: No aggressive appearing focal osseous lesions. Severe thoracic spondylosis. IMPRESSION: 1. Mild patchy subpleural reticulation and ground-glass attenuation with associated minimal traction bronchiolectasis, asymmetrically involving the right lung, with a slight basilar predominance. Findings have not progressed since  09/16/2015 (and have possibly slightly improved). No frank honeycombing. Findings are most suggestive of nonspecific interstitial pneumonia (NSIP). Findings are not consistent with usual interstitial pneumonia (UIP). 2. Stable moderate patchy air trapping, indicating small airways disease. 3. Aortic atherosclerosis.  Two-vessel coronary atherosclerosis. 4. Stable trace pericardial effusion/thickening. 5. Stable left adrenal adenoma. Electronically Signed   By: Ilona Sorrel M.D.   On: 04/05/2016 16:50   Dg Chest Port 1 View  Result Date: 04/17/2016 CLINICAL DATA:  74 y/o  F; shortness of breath. EXAM: PORTABLE CHEST 1 VIEW COMPARISON:  10/02/2015 chest radiograph. FINDINGS: Stable cardiomegaly given projection and technique. Prominent interstitial markings may represent interstitial edema or atypical pneumonia. No focal consolidation. No effusion or pneumothorax. Multilevel degenerative changes of the spine and reverse S curvature. Severe osteoarthrosis of shoulder joints. IMPRESSION: Prominent interstitial markings may represent interstitial edema or atypical pneumonia. Stable cardiomegaly. Electronically Signed   By: Kristine Garbe M.D.   On: 04/17/2016 22:21    Assessment:   Vanice I Boulos is a 75 y.o. 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 USS renal negative.   Clinically improving with meropenem. Did have UTI sxs for a few weeks prior.  Recommendations Continue meropenem pending sensitivities If sensitive to oral regimen can transition to that when stable for dc  Thank you very much for allowing me to participate in the care of this patient. Please call with questions.   Cheral Marker. Ola Spurr, MD

## 2016-04-20 LAB — URINE CULTURE

## 2016-04-20 MED ORDER — IPRATROPIUM-ALBUTEROL 0.5-2.5 (3) MG/3ML IN SOLN
3.0000 mL | RESPIRATORY_TRACT | Status: DC
  Administered 2016-04-20 – 2016-04-21 (×4): 3 mL via RESPIRATORY_TRACT
  Filled 2016-04-20 (×5): qty 3

## 2016-04-20 MED ORDER — AZITHROMYCIN 500 MG PO TABS
500.0000 mg | ORAL_TABLET | Freq: Every day | ORAL | Status: DC
  Administered 2016-04-21: 500 mg via ORAL
  Filled 2016-04-20: qty 1

## 2016-04-20 MED ORDER — TRAZODONE HCL 50 MG PO TABS
75.0000 mg | ORAL_TABLET | Freq: Every day | ORAL | Status: DC
  Administered 2016-04-20: 75 mg via ORAL
  Filled 2016-04-20: qty 2

## 2016-04-20 MED ORDER — METHYLPREDNISOLONE SODIUM SUCC 40 MG IJ SOLR
40.0000 mg | INTRAMUSCULAR | Status: DC
  Administered 2016-04-21: 40 mg via INTRAVENOUS
  Filled 2016-04-20: qty 1

## 2016-04-20 MED ORDER — LOPERAMIDE HCL 2 MG PO CAPS
2.0000 mg | ORAL_CAPSULE | ORAL | Status: DC | PRN
  Administered 2016-04-20: 2 mg via ORAL
  Filled 2016-04-20: qty 1

## 2016-04-20 NOTE — Progress Notes (Signed)
[facility] PROGRESS NOTE Date of Admission:  04/17/2016     ID: Susan [name] is a 75 y.o. female with GNR bacteremia, UTI Active Problems:   Acute respiratory failure with hypoxia ([place])   Subjective: No fevers, feels a little stronger, still on O2  ROS  Eleven systems are reviewed and negative except per hpi  Medications:  Antibiotics Given (last 72 hours)    Date/Time Action Medication Dose Rate   04/17/16 2348 Given   cefTRIAXone (ROCEPHIN) IVPB 1 g 1 g 100 mL/hr   04/18/16 0300 Given   acyclovir (ZOVIRAX) 200 MG capsule 400 mg 400 mg    04/18/16 0947 Given   acyclovir (ZOVIRAX) 200 MG capsule 400 mg 400 mg    04/18/16 2102 Given   acyclovir (ZOVIRAX) 200 MG capsule 400 mg 400 mg    04/18/16 2104 Given   meropenem (MERREM) 1 g in sodium chloride 0.9 % 100 mL IVPB 1 g 200 mL/hr   04/19/16 0837 Given   acyclovir (ZOVIRAX) 200 MG capsule 400 mg 400 mg    04/19/16 0838 Given   azithromycin (ZITHROMAX) 500 mg in dextrose 5 % 250 mL IVPB 500 mg 250 mL/hr   04/19/16 [id number] Given   meropenem (MERREM) 1 g in sodium chloride 0.9 % 100 mL IVPB 1 g 200 mL/hr   04/19/16 1320 Given   meropenem (MERREM) IVPB SOLR 1 g 1 g 100 mL/hr   04/19/16 2202 Given   meropenem (MERREM) IVPB SOLR 1 g 1 g 100 mL/hr   04/20/16 0555 Given   meropenem (MERREM) IVPB SOLR 1 g 1 g 100 mL/hr   04/20/16 0844 Given   azithromycin (ZITHROMAX) 500 mg in dextrose 5 % 250 mL IVPB 500 mg 250 mL/hr   04/20/16 1354 Given   meropenem (MERREM) IVPB SOLR 1 g 1 g 100 mL/hr     . acidophilus  1 capsule Oral Daily  . aspirin  81 mg Oral Daily  . azithromycin  500 mg Intravenous Q24H  . chlorhexidine  15 mL Mouth Rinse BID  . dextromethorphan  30 mg Oral BID   And  . guaiFENesin  600 mg Oral BID  . enoxaparin (LOVENOX) injection  40 mg Subcutaneous Q24H  . fluticasone  2 spray Each Nare Daily  . ipratropium-albuterol  3 mL Nebulization Q4H  . mouth rinse  15 mL Mouth Rinse q12n4p  .  meropenem  1 g Intravenous Q8H  . methylPREDNISolone (SOLU-MEDROL) injection  60 mg Intravenous Q24H  . mometasone-formoterol  2 puff Inhalation BID  . multivitamin with minerals  1 tablet Oral Daily  . rOPINIRole  3 mg Oral QHS  . sodium chloride flush  3 mL Intravenous Q12H  . traZODone  75 mg Oral QHS    Objective: Vital signs in last 24 hours: Temp:  [97.5 F (36.4 C)-98.9 F (37.2 C)] 98 F (36.7 C) (11/29 1130) Pulse Rate:  [86-93] 87 (11/29 1130) Resp:  [18-20] 19 (11/29 1130) BP: (130-165)/(65-104) 160/93 (11/29 1130) SpO2:  [94 %-97 %] 95 % (11/29 1200) Constitutional:  oriented to person, place, and time.Obese, on O2 HENT: [place]/AT, PERRLA, no scleral icterus Mouth/Throat: Oropharynx is clear and moist. No oropharyngeal exudate.  Cardiovascular: Normal rate, regular rhythm and normal heart sounds. Exam reveals no gallop and no friction rub.  No murmur heard.  Pulmonary/Chest: Ppor air movement, + wheeze Neck = supple, no nuchal rigidity Abdominal: Soft. obese Bowel sounds are normal.  exhibits no distension. There is  no tenderness.  Lymphadenopathy: no cervical adenopathy. No axillary adenopathy Neurological: alert and oriented to person, place, and time.  Skin: Skin is warm and dry. No rash noted. No erythema.  Psychiatric: a normal mood and affect.  behavior is normal.   Lab Results  Recent Labs  04/18/16 0525 04/18/16 1745 04/19/16 0440  WBC 13.5*  --  25.5*  HGB 12.9  --  12.2  HCT 38.9  --  36.0  NA 138 140  --   K 3.3* 3.7  --   CL 110 108  --   CO2 18* 23  --   BUN 16 22*  --   CREATININE 1.25* 0.92  --     Microbiology: Results for orders placed or performed during the hospital encounter of 04/17/16  Blood culture (routine x 2)     Status: Abnormal (Preliminary result)   Collection Time: 04/17/16  9:55 PM  Result Value Ref Range Status   Specimen Description BLOOD RIGHT ANTECUBITAL  Final   Special Requests   Final    BOTTLES DRAWN AEROBIC AND  ANAEROBIC [id number],ANAB9CC   Culture  Setup Time   Final    GRAM NEGATIVE RODS AEROBIC BOTTLE ONLY CRITICAL VALUE NOTED.  VALUE IS CONSISTENT WITH PREVIOUSLY REPORTED AND CALLED VALUE.    Culture (A)  Final    KLEBSIELLA PNEUMONIAE CULTURE REINCUBATED FOR BETTER GROWTH Performed at [facility]    Report Status PENDING  Incomplete  Blood culture (routine x 2)     Status: None (Preliminary result)   Collection Time: 04/17/16  9:55 PM  Result Value Ref Range Status   Specimen Description BLOOD BLOOD LEFT HAND  Final   Special Requests   Final    BOTTLES DRAWN AEROBIC AND ANAEROBIC [id number],[id number]   Culture  Setup Time   Final    GRAM NEGATIVE RODS IN BOTH AEROBIC AND ANAEROBIC BOTTLES CRITICAL RESULT CALLED TO, READ BACK BY AND VERIFIED WITH: HANK ZOMPA @ 1245 04/18/2016 QSD    Culture   Final    GRAM NEGATIVE RODS CULTURE REINCUBATED FOR BETTER GROWTH Performed at [facility]    Report Status PENDING  Incomplete  Blood Culture ID Panel (Reflexed)     Status: Abnormal   Collection Time: 04/17/16  9:55 PM  Result Value Ref Range Status   Enterococcus species NOT DETECTED NOT DETECTED Final   Listeria monocytogenes NOT DETECTED NOT DETECTED Final   Staphylococcus species NOT DETECTED NOT DETECTED Final   Staphylococcus aureus NOT DETECTED NOT DETECTED Final   Streptococcus species NOT DETECTED NOT DETECTED Final   Streptococcus agalactiae NOT DETECTED NOT DETECTED Final   Streptococcus pneumoniae NOT DETECTED NOT DETECTED Final   Streptococcus pyogenes NOT DETECTED NOT DETECTED Final   Acinetobacter baumannii NOT DETECTED NOT DETECTED Final   Enterobacteriaceae species DETECTED (A) NOT DETECTED Final    Comment: CRITICAL RESULT CALLED TO, READ BACK BY AND VERIFIED WITH:  HANK ZOMPA @ 1245 04/18/2016 QSD    Enterobacter cloacae complex NOT DETECTED NOT DETECTED Final   Escherichia coli DETECTED (A) NOT DETECTED Final    Comment: CRITICAL RESULT CALLED TO, READ  BACK BY AND VERIFIED WITH:  HANK ZOMPA @ 1245 04/18/2016 QSD    Klebsiella oxytoca NOT DETECTED NOT DETECTED Final   Klebsiella pneumoniae DETECTED (A) NOT DETECTED Final    Comment: CRITICAL RESULT CALLED TO, READ BACK BY AND VERIFIED WITH: HANK ZOMPA @ 1245 04/18/2016 QSD    Proteus species NOT DETECTED NOT DETECTED Final  Serratia marcescens NOT DETECTED NOT DETECTED Final   Carbapenem resistance NOT DETECTED NOT DETECTED Final   Haemophilus influenzae NOT DETECTED NOT DETECTED Final   Neisseria meningitidis NOT DETECTED NOT DETECTED Final   Pseudomonas aeruginosa NOT DETECTED NOT DETECTED Final   Candida albicans NOT DETECTED NOT DETECTED Final   Candida glabrata NOT DETECTED NOT DETECTED Final   Candida krusei NOT DETECTED NOT DETECTED Final   Candida parapsilosis NOT DETECTED NOT DETECTED Final   Candida tropicalis NOT DETECTED NOT DETECTED Final  Urine culture     Status: Abnormal   Collection Time: 04/18/16  2:34 PM  Result Value Ref Range Status   Specimen Description URINE, CATHETERIZED  Final   Special Requests NONE  Final   Culture MULTIPLE SPECIES PRESENT, SUGGEST RECOLLECTION (A)  Final   Report Status 04/20/2016 FINAL  Final    Studies/Results: Dg Chest 2 View  Result Date: 04/19/2016 CLINICAL DATA:  Asthma and respiratory distress very EXAM: CHEST  2 VIEW COMPARISON:  04/17/2016 FINDINGS: The heart is moderately enlarged. Pulmonary vascularity is within normal limits. There is a linear and ill-defined opacity in the right mid lung zone extending from the right hilum towards the periphery of the right hemi thorax. It has become more prominent than on the prior study. This most likely represents volume loss however a coal less Ing airspace disease is not excluded. No pneumothorax. No pleural effusion. IMPRESSION: Right mid lung atelectasis versus early airspace disease. Electronically Signed   By: Marybelle Killings M.D.   On: 04/19/2016 12:14    Assessment/Plan: TAWYNA BETTERS is a 75 y.o. 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 USS renal negative.   Clinically improving with meropenem. Did have UTI sxs for a few weeks prior.   Recommendations Continue meropenem pending sensitivities - they are not yet back and I would not dc until we can be certain that she is on the appropriate antibiotics If sensitive to oral regimen can transition to that when stable for dc Thank you very much for the consult. Will follow with you.  [place], Anesa Fronek P   04/20/2016, 2:19 PM

## 2016-04-20 NOTE — Progress Notes (Signed)
[facility] [place] NAME: Caydince Brescia    MR#:  [id number]  DATE OF BIRTH:  1941/01/22  SUBJECTIVE:  CHIEF COMPLAINT:   Chief Complaint  Patient presents with  . Respiratory Distress  Patient is 76 year old female with past medical history significant for history of Peptic ulcer disease, COPD, arthritis, who presents to the hospital with complaints of respiratory distress, shortness of breath. In emergency room, she was found to be in significant distress and was started on BiPAP. She feels better today after initiation of steroids, antibiotic therapy intravenously. She admits of some cough and thick phlegm production. Chest x-ray revealed possible interstitial pneumonia versus interstitial edema. The patient was initiated on Lasix, creatinine worsened to 1.25 with diuresis. Lasix was stopped, creatinine has improved, The patient feels better today, complains of some wheezing, still remains on oxygen therapy, not on oxygen at home  Review of Systems  Constitutional: Negative for chills, fever and weight loss.  HENT: Negative for congestion.   Eyes: Negative for blurred vision and double vision.  Respiratory: Positive for cough, sputum production and shortness of breath. Negative for wheezing.   Cardiovascular: Negative for chest pain, palpitations, orthopnea, leg swelling and PND.  Gastrointestinal: Negative for abdominal pain, blood in stool, constipation, diarrhea, nausea and vomiting.  Genitourinary: Negative for dysuria, frequency, hematuria and urgency.  Musculoskeletal: Negative for falls.  Neurological: Negative for dizziness, tremors, focal weakness and headaches.  Endo/Heme/Allergies: Does not bruise/bleed easily.  Psychiatric/Behavioral: Negative for depression. The patient does not have insomnia.     VITAL SIGNS: Blood pressure (!) 160/93, pulse 87, temperature 98 F (36.7 C), temperature source Oral, resp. rate 19, height  5' (1.524 m), weight 91.4 kg (201 lb 6.4 oz), SpO2 95 %.  PHYSICAL EXAMINATION:   GENERAL:  75 y.o.-year-old patient lying in the bed in mild respiratory  Distress, tachypneic with exertion EYES: Pupils equal, round, reactive to light and accommodation. No scleral icterus. Extraocular muscles intact.  HEENT: Head atraumatic, normocephalic. Oropharynx and nasopharynx clear.  NECK:  Supple, no jugular venous distention. No thyroid enlargement, no tenderness.  LUNGS: Normal breath sounds bilaterally, diminished at bases, scattered wheezing on the left laterally, no rales,rhonchi , crepitations. Using accessory muscles of respiration  with speech and movementsCARDIOVASCULAR: S1, S2 tachycardic. No murmurs, rubs, or gallops.  ABDOMEN: Soft, no pain,  no rebound, guarding, nondistended. Bowel sounds present. No organomegaly or mass.  EXTREMITIES:1+ lower extremity and pedal  edema, no cyanosis, or clubbing.  NEUROLOGIC: Cranial nerves II through XII are intact. Muscle strength 5/5 in all extremities. Sensation intact. Gait not checked.  PSYCHIATRIC: The patient is alert and oriented x 3.  SKIN: No obvious rash, lesion, or ulcer.   ORDERS/RESULTS REVIEWED:   CBC  Recent Labs Lab 04/17/16 2155 04/18/16 0525 04/19/16 0440  WBC 1.1* 13.5* 25.5*  HGB 14.8 12.9 12.2  HCT 43.4 38.9 36.0  PLT 124* 110* 101*  MCV 90.2 90.3 91.0  MCH 30.7 30.0 31.0  MCHC 34.0 33.2 34.0  RDW 14.2 14.0 14.7*  LYMPHSABS 0.3*  --   --   MONOABS 0.0*  --   --   EOSABS 0.0  --   --   BASOSABS 0.0  --   --    ------------------------------------------------------------------------------------------------------------------  Chemistries   Recent Labs Lab 04/17/16 2155 04/18/16 0525 04/18/16 1605 04/18/16 1745  NA 136 138  --  140  K 4.0 3.3*  --  3.7  CL 106 110  --  108  CO2 21* 18*  --  23  GLUCOSE 127* 212*  --  121*  BUN 15 16  --  22*  CREATININE 0.82 1.25*  --  0.92  CALCIUM 8.7* 8.0*  --  8.5*   MG  --   --  1.9  --   AST 32  --   --  39  ALT 28  --   --  30  ALKPHOS 90  --   --  60  BILITOT 1.3*  --   --  0.8   ------------------------------------------------------------------------------------------------------------------ estimated creatinine clearance is 54.1 mL/min (by C-G formula based on SCr of 0.92 mg/dL). ------------------------------------------------------------------------------------------------------------------  Recent Labs  04/18/16 0525  TSH 0.813    Cardiac Enzymes  Recent Labs Lab 04/18/16 0525 04/18/16 1124 04/18/16 1605  TROPONINI 0.31* 0.31* 0.30*   ------------------------------------------------------------------------------------------------------------------ Invalid input(s): POCBNP ---------------------------------------------------------------------------------------------------------------  RADIOLOGY: Dg Chest 2 View  Result Date: 04/19/2016 CLINICAL DATA:  Asthma and respiratory distress very EXAM: CHEST  2 VIEW COMPARISON:  04/17/2016 FINDINGS: The heart is moderately enlarged. Pulmonary vascularity is within normal limits. There is a linear and ill-defined opacity in the right mid lung zone extending from the right hilum towards the periphery of the right hemi thorax. It has become more prominent than on the prior study. This most likely represents volume loss however a coal less Ing airspace disease is not excluded. No pneumothorax. No pleural effusion. IMPRESSION: Right mid lung atelectasis versus early airspace disease. Electronically Signed   By: Marybelle Killings M.D.   On: 04/19/2016 12:14    EKG:  Orders placed or performed during the hospital encounter of 04/17/16  . EKG 12-Lead  . EKG 12-Lead    ASSESSMENT AND PLAN:  Active Problems:   Acute respiratory failure with hypoxia ([facility]) #1. Sepsis due to Escherichia coli and Klebsiella pneumonia,  continue meropenem, Stable clinically, sensitivities are pending, change antibiotics  to oral to discharge home on 2 week antibiotic therapy, appreciate Dr. Blane Ohara input #2 Acute respiratory failure with hypoxia due to sepsis, ?pneumonia, continue oxygen therapy , weaning off as tolerated. Continue meropenem, Zithromax, taper steroids, chest x-ray showed no fluid retention, but atelectasis, currently better #3. Acute pyelonephritis, continue meropenem, urine culture showed multiple species, recollection was suggested, antibiotic therapy will be based on blood cultures, ultrasound of kidneys did not show hydronephrosis.  #4. Leukopenia, resolved, now leukocytosis, likely steroid mediated, recheck tomorrow in the morning #5. Hypokalemia, supplemented  orally, resolved, magnesium level was normal #6. Elevated troponin, likely demand ischemia, echocardiogram in November 2017, unremarkable but calcified aortic annulus. Continue prophylactic Lovenox dose. Heart rate has improved with conservative therapy #7. Frequent stool, get stool cultures, C. difficile if stool becomes liquidy, ? antibiotic related    Management plans discussed with the patient, family and they are in agreement.   DRUG ALLERGIES: No Known Allergies  CODE STATUS:     Code Status Orders        Start     Ordered   04/18/16 0154  Full code  Continuous     04/18/16 0153    Code Status History    Date Active Date Inactive Code Status Order ID Comments User Context   10/03/2014  2:39 PM 10/05/2014  4:49 PM Full Code [id number]  Rod Can, MD Inpatient   08/08/2014 12:22 PM 08/10/2014  6:00 PM Full Code [id number]  Rod Can, MD Inpatient    Advance Directive Documentation   Flowsheet Row Most Recent Value  Type of Advance Directive  Living will, Healthcare Power of Attorney  Pre-existing out of facility DNR order (yellow form or pink MOST form)  No data  "MOST" Form in Place?  No data      TOTAL TIME TAKING CARE OF THIS PATIENT: 35 minutes.  DC planning was discussed with care  management  Usher Hedberg M.D on 04/20/2016 at 2:56 PM  Between 7am to 6pm - Pager - [phone]  After 6pm go to [email or web address] - password [email or web address] [facility]  [facility]  Office  [phone]  CC: Primary care physician; Eliezer Lofts, MD

## 2016-04-21 LAB — CBC
HEMATOCRIT: 37.1 % (ref 35.0–47.0)
Hemoglobin: 12.6 g/dL (ref 12.0–16.0)
MCH: 31 pg (ref 26.0–34.0)
MCHC: 34.1 g/dL (ref 32.0–36.0)
MCV: 90.9 fL (ref 80.0–100.0)
Platelets: 129 10*3/uL — ABNORMAL LOW (ref 150–440)
RBC: 4.08 MIL/uL (ref 3.80–5.20)
RDW: 14.6 % — AB (ref 11.5–14.5)
WBC: 16.7 10*3/uL — AB (ref 3.6–11.0)

## 2016-04-21 LAB — CULTURE, BLOOD (ROUTINE X 2)

## 2016-04-21 LAB — GLUCOSE, CAPILLARY: Glucose-Capillary: 71 mg/dL (ref 65–99)

## 2016-04-21 MED ORDER — CIPROFLOXACIN HCL 250 MG PO TABS: 250.0000 mg | ORAL_TABLET | Freq: Two times a day (BID) | ORAL | 0 refills | Status: DC

## 2016-04-21 MED ORDER — HYDROCODONE-ACETAMINOPHEN 5-325 MG PO TABS: 1.0000 | ORAL_TABLET | Freq: Four times a day (QID) | ORAL | 0 refills | Status: DC | PRN

## 2016-04-21 MED ORDER — IPRATROPIUM-ALBUTEROL 0.5-2.5 (3) MG/3ML IN SOLN
3.0000 mL | Freq: Four times a day (QID) | RESPIRATORY_TRACT | Status: DC
  Administered 2016-04-21: 3 mL via RESPIRATORY_TRACT
  Filled 2016-04-21: qty 3

## 2016-04-22 LAB — CULTURE, BLOOD (ROUTINE X 2)

## 2016-04-25 ENCOUNTER — Telehealth: Payer: Self-pay

## 2016-04-25 NOTE — Telephone Encounter (Signed)
LM requesting patient to return call, needs TCM complete and confirm upcoming appt.

## 2016-04-25 NOTE — Telephone Encounter (Signed)
Transition Care Management Follow-up Telephone Call   Date discharged?  04/22/16   How have you been since you were released from the hospital? "better"   Do you understand why you were in the hospital? yes, "bladder infection, pneumonia and sepsis"   Do you understand the discharge instructions? yes   Where were you discharged to? Home.    Items Reviewed:  Medications reviewed: no, Patient did not have medications/list at time, will bring new medications to appointment.   Allergies reviewed: yes  Dietary changes reviewed: yes, Heart healthy, reviewed diet.   Referrals reviewed: no   Functional Questionnaire:   Activities of Daily Living (ADLs):   She states they are independent in the following: ambulation, bathing and hygiene, feeding, continence, grooming, toileting and dressing States they require assistance with the following: none   Any transportation issues/concerns?: no   Any patient concerns? no   Confirmed importance and date/time of follow-up visits scheduled yes  Provider Appointment booked with PCP Tues, 04/25/16 @ 10am.   Confirmed with patient if condition begins to worsen call PCP or go to the ER.  Patient was given the office number and encouraged to call back with question or concerns.  : yes

## 2016-04-26 ENCOUNTER — Encounter: Payer: Self-pay | Admitting: Family Medicine

## 2016-04-26 ENCOUNTER — Ambulatory Visit (INDEPENDENT_AMBULATORY_CARE_PROVIDER_SITE_OTHER): Payer: Medicare Other | Admitting: Family Medicine

## 2016-04-26 VITALS — BP 161/76 | HR 74 | Temp 98.2°F | Ht 60.0 in | Wt 199.0 lb

## 2016-04-26 DIAGNOSIS — A4159 Other Gram-negative sepsis: Secondary | ICD-10-CM | POA: Insufficient documentation

## 2016-04-26 DIAGNOSIS — J9601 Acute respiratory failure with hypoxia: Secondary | ICD-10-CM

## 2016-04-26 DIAGNOSIS — R7881 Bacteremia: Secondary | ICD-10-CM | POA: Insufficient documentation

## 2016-04-26 DIAGNOSIS — N3001 Acute cystitis with hematuria: Secondary | ICD-10-CM

## 2016-04-26 DIAGNOSIS — A414 Sepsis due to anaerobes: Secondary | ICD-10-CM | POA: Diagnosis not present

## 2016-04-26 DIAGNOSIS — B9620 Unspecified Escherichia coli [E. coli] as the cause of diseases classified elsewhere: Secondary | ICD-10-CM

## 2016-04-26 DIAGNOSIS — B009 Herpesviral infection, unspecified: Secondary | ICD-10-CM

## 2016-04-26 DIAGNOSIS — J849 Interstitial pulmonary disease, unspecified: Secondary | ICD-10-CM

## 2016-04-26 MED ORDER — VALACYCLOVIR HCL 1 G PO TABS: 1000.0000 mg | ORAL_TABLET | Freq: Every day | ORAL | 0 refills | Status: DC

## 2016-04-26 MED ORDER — ROPINIROLE HCL 3 MG PO TABS: 3.0000 mg | ORAL_TABLET | Freq: Every day | ORAL | 3 refills | Status: DC

## 2016-04-26 NOTE — Assessment & Plan Note (Signed)
Stable.  Complete course of antibitoics. Pt asymptomatic. No continued urinary issues.

## 2016-04-26 NOTE — Progress Notes (Signed)
   Subjective:    Patient ID: Susan [name], female    DOB: 1940/08/24, 75 y.o.   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 Date of admission: 04/17/2016  Discharge date 04/21/2016 Transitional care call: 04/25/2016  Admitted with sob, fever, leukocytosis, and UA with TNTC WBC.  UTI and bacteremia with Klebsiella and E coli. US renal negative.    treated with IV antibitoics meropenem.  Transitioned to cipro 250 mg BID x 2 weeks total. On day 6/14. She is still some short of breath,  Using albuterol inh/neb prn.. Nebs about once daily, uses inhaler no more than 1-2 times daily. Has follow up in 2 days with Dr. Lake Bells. No further burning , frequency, no urgency.   She has noted a rash on her rear end.  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 She does not  Have any acyclovir.   She plans to get back to exercise when she feels better.    2 UTI in last year.. This was likely unresolved 2nd infection.   Review of Systems  Constitutional: Positive for fatigue. Negative for fever.  HENT: Negative for ear pain.   Eyes: Negative for pain.  Respiratory: Negative for chest tightness and shortness of breath.   Cardiovascular: Negative for chest pain, palpitations and leg swelling.  Gastrointestinal: Negative for abdominal pain.  Genitourinary: Negative for dysuria.       Objective:   Physical Exam  Constitutional: Vital signs are normal. She appears well-developed and well-nourished. She is cooperative.  Non-toxic appearance. She does not appear ill. No distress.  Obese , uses walker  HENT:  Head: Normocephalic.  Right Ear: Hearing, tympanic membrane, external ear and ear canal normal. Tympanic membrane is not erythematous, not retracted and not bulging.  Left Ear: Hearing, tympanic membrane, external ear and ear canal normal. Tympanic membrane is not  erythematous, not retracted and not bulging.  Nose: No mucosal edema or rhinorrhea. Right sinus exhibits no maxillary sinus tenderness and no frontal sinus tenderness. Left sinus exhibits no maxillary sinus tenderness and no frontal sinus tenderness.  Mouth/Throat: Uvula is midline, oropharynx is clear and moist and mucous membranes are normal.  Eyes: Conjunctivae, EOM and lids are normal. Pupils are equal, round, and reactive to light. Lids are everted and swept, no foreign bodies found.  Neck: Trachea normal and normal range of motion. Neck supple. Carotid bruit is not present. No thyroid mass and no thyromegaly present.  Cardiovascular: Normal rate, regular rhythm, S1 normal, S2 normal, normal heart sounds, intact distal pulses and normal pulses.  Exam reveals no gallop and no friction rub.   No murmur heard. Pulmonary/Chest: Effort normal and breath sounds normal. No tachypnea. No respiratory distress. She has no decreased breath sounds. She has no wheezes. She has no rhonchi. She has no rales.  Abdominal: Soft. Normal appearance and bowel sounds are normal. There is no tenderness.  Neurological: She is alert.  Skin: Skin is warm, dry and intact. No rash noted.  Blisters with red base on buttocks centrally at gluteal crease  Psychiatric: Her speech is normal and behavior is normal. Judgment and thought content normal. Her mood appears not anxious. Cognition and memory are normal. She does not exhibit a depressed mood.          Assessment & Plan:

## 2016-04-26 NOTE — Discharge Summary (Signed)
[facility] [place] NAME: Susan [name]    MR#:  [id number]  DATE OF BIRTH:  02/12/1941  DATE OF ADMISSION:  04/17/2016 ADMITTING PHYSICIAN: Harvie Bridge, DO  DATE OF DISCHARGE: 04/21/2016  5:34 PM  PRIMARY CARE PHYSICIAN: Eliezer Lofts, MD    ADMISSION DIAGNOSIS:  COPD exacerbation ([place]) [J44.1] Acute respiratory failure with hypoxia ([place]) [J96.01] Community acquired pneumonia, unspecified laterality [J18.9]  DISCHARGE DIAGNOSIS:  Active Problems:   Acute respiratory failure with hypoxia ([place])   pneumonia  SECONDARY DIAGNOSIS:   Past Medical History:  Diagnosis Date  . Acute peptic ulcer, unspecified site, with hemorrhage and perforation, without mention of obstruction   . Arthritis   . Asthma   . Balance problems    unsteady on feet at time  . Chronic airway obstruction, not elsewhere classified    on inhalers  . Diverticulosis of colon (without mention of hemorrhage)   . Hematochezia   . 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 Ventral hernia     HOSPITAL COURSE:   #1. Sepsis due to Escherichia coli and Klebsiella pneumonia,  continue meropenem, Stable clinically, sensitivities are reviewed, changed antibiotics to oral to discharge home on 2 week antibiotic therapy, appreciate Dr. Blane Ohara input #2 Acute respiratory failure with hypoxia due to sepsis, ?pneumonia, continue oxygen therapy , weaning off as tolerated. Continue meropenem, Zithromax, taper steroids, chest x-ray showed no fluid retention, but atelectasis, currently better #3. Acute pyelonephritis, continue meropenem, urine culture showed multiple species, recollection was suggested, antibiotic therapy will be based on blood cultures, ultrasound of kidneys did not show hydronephrosis.  #4. Leukopenia, resolved, now leukocytosis, likely steroid mediated, recheck tomorrow in the morning #5. Hypokalemia, supplemented  orally, resolved,  magnesium level was normal #6. Elevated troponin, likely demand ischemia, echocardiogram in November 2017, unremarkable but calcified aortic annulus. Continue prophylactic Lovenox dose. Heart rate has improved with conservative therapy #7. Frequent stool, get stool cultures, C. difficile if stool becomes liquidy, ? antibiotic related- improved. c diff not checked.   DISCHARGE CONDITIONS:   Stable.  CONSULTS OBTAINED:    DRUG ALLERGIES:  No Known Allergies  DISCHARGE MEDICATIONS:   Discharge Medication List as of 04/21/2016  3:48 PM    START taking these medications   Details  ciprofloxacin (CIPRO) 250 MG tablet Take 1 tablet (250 mg total) by mouth 2 (two) times daily., Starting Thu 04/21/2016, Print    HYDROcodone-acetaminophen (NORCO/VICODIN) 5-325 MG tablet Take 1 tablet by mouth every 6 (six) hours as needed for moderate pain., Starting Thu 04/21/2016, Print      CONTINUE these medications which have NOT CHANGED   Details  albuterol (PROVENTIL) (2.5 MG/3ML) 0.083% nebulizer solution Take 3 mLs (2.5 mg total) by nebulization every 6 (six) hours as needed for wheezing or shortness of breath., Starting Mon 01/11/2016, Normal    aspirin 81 MG tablet Take 81 mg by mouth daily., Historical Med    fluticasone (FLONASE) 50 MCG/ACT nasal spray Place 2 sprays into both nostrils daily., Starting Wed 02/03/2016, Normal    ibuprofen (ADVIL,MOTRIN) 200 MG tablet Take 600 mg by mouth every 6 (six) hours as needed. , Until Discontinued, Historical Med    Multiple Vitamin (MULTIVITAMIN WITH MINERALS) TABS tablet Take 1 tablet by mouth daily., Until Discontinued, Historical Med    Probiotic Product (PROBIOTIC PO) Take 1 tablet by mouth daily., Until Discontinued, Historical Med    SYMBICORT 160-4.5 MCG/ACT inhaler INHALE 2 PUFFS INTO THE LUNGS  2 (TWO) TIMES DAILY., Normal    traZODone (DESYREL) 50 MG tablet TAKE ONE AND ONE-HALF TABLETS AT BEDTIME. AS NEEDED FOR SLEEP, Normal    VENTOLIN  HFA 108 (90 Base) MCG/ACT inhaler INHALE 2 PUFFS 1 OR 2 MINUTES APART EVERY 6 HOURS AS NEEDED FOR WHEEZING TIGHT COUGH OR SHORTNESS OF BREATH, Normal    rOPINIRole (REQUIP) 3 MG tablet Take 1 tablet (3 mg total) by mouth at bedtime., Starting Tue 01/12/2016, Normal    traMADol (ULTRAM) 50 MG tablet TAKE 1 OR 2 TABLETS EVERY 6 HOURS AS NEEDED FOR PAIN, Phone In      STOP taking these medications     acyclovir (ZOVIRAX) 400 MG tablet          DISCHARGE INSTRUCTIONS:    Follow with PMD.  If you experience worsening of your admission symptoms, develop shortness of breath, life threatening emergency, suicidal or homicidal thoughts you must seek medical attention immediately by calling 911 or calling your MD immediately  if symptoms less severe.  You Must read complete instructions/literature along with all the possible adverse reactions/side effects for all the Medicines you take and that have been prescribed to you. Take any new Medicines after you have completely understood and accept all the possible adverse reactions/side effects.   Please note  You were cared for by a hospitalist during your hospital stay. If you have any questions about your discharge medications or the care you received while you were in the hospital after you are discharged, you can call the unit and asked to speak with the hospitalist on call if the hospitalist that took care of you is not available. Once you are discharged, your primary care physician will handle any further medical issues. Please note that NO REFILLS for any discharge medications will be authorized once you are discharged, as it is imperative that you return to your primary care physician (or establish a relationship with a primary care physician if you do not have one) for your aftercare needs so that they can reassess your need for medications and monitor your lab values.    Today   CHIEF COMPLAINT:   Chief Complaint  Patient presents with   . Respiratory Distress    HISTORY OF PRESENT ILLNESS:  Susan [name]  is a 75 y.o. female with a known history of Asthma, arthritis, COPD presents to the emergency department complaining of respiratory distress. EMS reports history of COPD they found her in significant distress and started her on BiPAP.  Otherwise there has been no change in status. Patient has been taking medication as prescribed and there has been no recent change in medication or diet.  There has been no recent illness, travel or sick contacts.    Patient denies fevers/chills, weakness, dizziness, chest pain, shortness of breath, N/V/C/D, abdominal pain, dysuria/frequency, changes in mental status.    VITAL SIGNS:  Blood pressure 138/84, pulse 74, temperature 98.5 F (36.9 C), temperature source Oral, resp. rate 16, height 5' (1.524 m), weight 91.4 kg (201 lb 6.4 oz), SpO2 92 %.  I/O:  No intake or output data in the 24 hours ending 04/26/16 1229  PHYSICAL EXAMINATION:  GENERAL:  75 y.o.-year-old patient lying in the bed in mild respiratory  Distress, tachypneic with exertion EYES: Pupils equal, round, reactive to light and accommodation. No scleral icterus. Extraocular muscles intact.  HEENT: Head atraumatic, normocephalic. Oropharynx and nasopharynx clear.  NECK:  Supple, no jugular venous distention. No thyroid enlargement, no tenderness.  LUNGS:  Normal breath sounds bilaterally, diminished at bases, scattered wheezing on the left laterally, no rales,rhonchi , crepitations. Using accessory muscles of respiration  with speech and movementsCARDIOVASCULAR: S1, S2 tachycardic. No murmurs, rubs, or gallops.  ABDOMEN: Soft, no pain,  no rebound, guarding, nondistended. Bowel sounds present. No organomegaly or mass.  EXTREMITIES:1+ lower extremity and pedal  edema, no cyanosis, or clubbing.  NEUROLOGIC: Cranial nerves II through XII are intact. Muscle strength 5/5 in all extremities. Sensation intact. Gait not checked.   PSYCHIATRIC: The patient is alert and oriented x 3.  SKIN: No obvious rash, lesion, or ulcer.   DATA REVIEW:   CBC  Recent Labs Lab 04/21/16 0340  WBC 16.7*  HGB 12.6  HCT 37.1  PLT 129*    Chemistries  No results for input(s): NA, K, CL, CO2, GLUCOSE, BUN, CREATININE, CALCIUM, MG, AST, ALT, ALKPHOS, BILITOT in the last 168 hours.  Invalid input(s): GFRCGP  Cardiac Enzymes No results for input(s): TROPONINI in the last 168 hours.  Microbiology Results  Results for orders placed or performed during the hospital encounter of 04/17/16  Blood culture (routine x 2)     Status: Abnormal   Collection Time: 04/17/16  9:55 PM  Result Value Ref Range Status   Specimen Description BLOOD RIGHT ANTECUBITAL  Final   Special Requests   Final    BOTTLES DRAWN AEROBIC AND ANAEROBIC [id number]   Culture  Setup Time   Final    GRAM NEGATIVE RODS AEROBIC BOTTLE ONLY CRITICAL VALUE NOTED.  VALUE IS CONSISTENT WITH PREVIOUSLY REPORTED AND CALLED VALUE.    Culture ESCHERICHIA COLI KLEBSIELLA PNEUMONIAE  (A)  Final   Report Status 04/22/2016 FINAL  Final  Blood culture (routine x 2)     Status: Abnormal   Collection Time: 04/17/16  9:55 PM  Result Value Ref Range Status   Specimen Description BLOOD BLOOD LEFT HAND  Final   Special Requests   Final    BOTTLES DRAWN AEROBIC AND ANAEROBIC [id number]   Culture  Setup Time   Final    GRAM NEGATIVE RODS IN BOTH AEROBIC AND ANAEROBIC BOTTLES CRITICAL RESULT CALLED TO, READ BACK BY AND VERIFIED WITH: HANK ZOMPA @ 1245 04/18/2016 QSD Performed at [place]  (A)  Final   Report Status 04/21/2016 FINAL  Final   Organism ID, Bacteria ESCHERICHIA COLI  Final   Organism ID, Bacteria KLEBSIELLA PNEUMONIAE  Final      Susceptibility   Escherichia coli - MIC*    AMPICILLIN 8 SENSITIVE Sensitive     CEFAZOLIN <=4 SENSITIVE Sensitive     CEFEPIME <=1 SENSITIVE Sensitive      CEFTAZIDIME <=1 SENSITIVE Sensitive     CEFTRIAXONE <=1 SENSITIVE Sensitive     CIPROFLOXACIN <=0.25 SENSITIVE Sensitive     GENTAMICIN <=1 SENSITIVE Sensitive     IMIPENEM <=0.25 SENSITIVE Sensitive     TRIMETH/SULFA <=20 SENSITIVE Sensitive     AMPICILLIN/SULBACTAM 4 SENSITIVE Sensitive     PIP/TAZO <=4 SENSITIVE Sensitive     Extended ESBL NEGATIVE Sensitive     * ESCHERICHIA COLI   Klebsiella pneumoniae - MIC*    AMPICILLIN <=2 RESISTANT Resistant     CEFAZOLIN <=4 SENSITIVE Sensitive     CEFEPIME <=1 SENSITIVE Sensitive     CEFTAZIDIME <=1 SENSITIVE Sensitive     CEFTRIAXONE <=1 SENSITIVE Sensitive     CIPROFLOXACIN <=0.25 SENSITIVE Sensitive     GENTAMICIN <=1 SENSITIVE Sensitive  IMIPENEM <=0.25 SENSITIVE Sensitive     TRIMETH/SULFA <=20 SENSITIVE Sensitive     AMPICILLIN/SULBACTAM <=2 SENSITIVE Sensitive     PIP/TAZO <=4 SENSITIVE Sensitive     Extended ESBL NEGATIVE Sensitive     * KLEBSIELLA PNEUMONIAE  Blood Culture ID Panel (Reflexed)     Status: Abnormal   Collection Time: 04/17/16  9:55 PM  Result Value Ref Range Status   Enterococcus species NOT DETECTED NOT DETECTED Final   Listeria monocytogenes NOT DETECTED NOT DETECTED Final   Staphylococcus species NOT DETECTED NOT DETECTED Final   Staphylococcus aureus NOT DETECTED NOT DETECTED Final   Streptococcus species NOT DETECTED NOT DETECTED Final   Streptococcus agalactiae NOT DETECTED NOT DETECTED Final   Streptococcus pneumoniae NOT DETECTED NOT DETECTED Final   Streptococcus pyogenes NOT DETECTED NOT DETECTED Final   Acinetobacter baumannii NOT DETECTED NOT DETECTED Final   Enterobacteriaceae species DETECTED (A) NOT DETECTED Final    Comment: CRITICAL RESULT CALLED TO, READ BACK BY AND VERIFIED WITH:  HANK ZOMPA @ 1245 04/18/2016 QSD    Enterobacter cloacae complex NOT DETECTED NOT DETECTED Final   Escherichia coli DETECTED (A) NOT DETECTED Final    Comment: CRITICAL RESULT CALLED TO, READ BACK BY AND  VERIFIED WITH:  HANK ZOMPA @ [id number] 04/18/2016 QSD    Klebsiella oxytoca NOT DETECTED NOT DETECTED Final   Klebsiella pneumoniae DETECTED (A) NOT DETECTED Final    Comment: CRITICAL RESULT CALLED TO, READ BACK BY AND VERIFIED WITH: HANK ZOMPA @ [id number] 04/18/2016 QSD    Proteus species NOT DETECTED NOT DETECTED Final   Serratia marcescens NOT DETECTED NOT DETECTED Final   Carbapenem resistance NOT DETECTED NOT DETECTED Final   Haemophilus influenzae NOT DETECTED NOT DETECTED Final   Neisseria meningitidis NOT DETECTED NOT DETECTED Final   Pseudomonas aeruginosa NOT DETECTED NOT DETECTED Final   Candida albicans NOT DETECTED NOT DETECTED Final   Candida glabrata NOT DETECTED NOT DETECTED Final   Candida krusei NOT DETECTED NOT DETECTED Final   Candida parapsilosis NOT DETECTED NOT DETECTED Final   Candida tropicalis NOT DETECTED NOT DETECTED Final  Urine culture     Status: Abnormal   Collection Time: 04/18/16  2:34 PM  Result Value Ref Range Status   Specimen Description URINE, CATHETERIZED  Final   Special Requests NONE  Final   Culture MULTIPLE SPECIES PRESENT, SUGGEST RECOLLECTION (A)  Final   Report Status 04/20/2016 FINAL  Final    RADIOLOGY:  No results found.  EKG:   Orders placed or performed during the hospital encounter of 04/17/16  . EKG 12-Lead  . EKG 12-Lead      Management plans discussed with the patient, family and they are in agreement.  CODE STATUS:  Code Status History    Date Active Date Inactive Code Status Order ID Comments User Context   04/18/2016  1:53 AM 04/21/2016  8:39 PM Full Code [id number]  Harvie Bridge, DO Inpatient   10/03/2014  2:39 PM 10/05/2014  4:49 PM Full Code [id number]  Rod Can, MD Inpatient   08/08/2014 12:22 PM 08/10/2014  6:00 PM Full Code [id number]  Rod Can, MD Inpatient    Advance Directive Documentation   Flowsheet Row Most Recent Value  Type of Advance Directive  Living will, Healthcare Power of Attorney   Pre-existing out of facility DNR order (yellow form or pink MOST form)  No data  "MOST" Form in Place?  No data      TOTAL TIME TAKING CARE OF THIS  PATIENT: 35 minutes.    Vaughan Basta M.D on 04/26/2016 at 12:29 PM  Between 7am to 6pm - Pager - [phone]  After 6pm go to [email or web address] - password [email or web address] [facility]  Office  [phone]  CC: Primary care physician; Eliezer Lofts, MD   Note: This dictation was prepared with Dragon dictation along with smaller phrase technology. Any transcriptional errors that result from this process are unintentional.

## 2016-04-26 NOTE — Assessment & Plan Note (Signed)
Keep follow up in 2 days with pulmonary.

## 2016-04-26 NOTE — Patient Instructions (Signed)
Push fluids, Complete antibiotics completely.  Keep appt  with pulmonology.  Complete course of valacyclovir for herpes flare up.

## 2016-04-26 NOTE — Assessment & Plan Note (Signed)
Flare up  On buttocks. Treat with 5 day course of valacyclovir.

## 2016-04-26 NOTE — Assessment & Plan Note (Signed)
Resolved

## 2016-04-28 ENCOUNTER — Ambulatory Visit: Payer: Medicare Other | Admitting: Family Medicine

## 2016-04-28 ENCOUNTER — Encounter: Payer: Self-pay | Admitting: Pulmonary Disease

## 2016-04-28 ENCOUNTER — Ambulatory Visit (INDEPENDENT_AMBULATORY_CARE_PROVIDER_SITE_OTHER): Payer: Medicare Other | Admitting: Pulmonary Disease

## 2016-04-28 DIAGNOSIS — J849 Interstitial pulmonary disease, unspecified: Secondary | ICD-10-CM

## 2016-04-28 MED ORDER — PREDNISONE 10 MG PO TABS: ORAL_TABLET | ORAL | 0 refills | Status: DC

## 2016-04-28 NOTE — Assessment & Plan Note (Signed)
She has a mild fibrotic pattern in the bases of her lungs which is suggestive of nonspecific interstitial pneumonitis. Typically this condition is associated with an underlying connective tissue disease, however we do not believe she has one of those. I explained to her today that the only way to know for certain what happening in her lungs would be to perform an upper lung biopsy. However, considering the discrepancy between the progression of her symptoms and guaiac progression in her CT scan and lung function testing I'm not certain that doing an open lung biopsy is worth the risk right now.  She and I with her daughter talked about various treatment options. We talked about the possibility of treating with empiric steroids versus an open lung biopsy. At this time she would prefer to go the route of empiric steroids.  Plan: Follow-up in 6 weeks with Dr. Chase Caller for a second opinion regarding her interstitial lung disease Start prednisone 30 mg daily for a month, then 20 mg daily for a month, then 10 mg daily for a month Lung function testing in 3 months Follow-up with me in 3 months  Greater than 50% of this 28 minute visit spent face-to-face

## 2016-04-28 NOTE — Progress Notes (Signed)
Subjective:    Patient ID: Susan [name], female    DOB: 09/04/1940, 75 y.o.   MRN: [id number]  Synopsis: Ms. Vivirito first saw the [facility] pulmonary clinic in September 2014 for shortness of breath. She had a previous heavy smoking history but he quit by the time she saw me. Lung function testing was actually not consistent with obstruction but she did have emphysema on her chest CT. Spiriva was started empirically for that. She also had a home sleep study which showed an apnea hypopnea index of 44. A follow-up split-night study showed an AHI of only 18, CPAP was titrated to 10 cm of water but she was only able to wear it for approximately 15 minutes. She has had difficulty using CPAP when prescribed in the past.     HPI  Chief Complaint  Patient presents with  . Interstitial Lung Disease    Breathing is worse since last OV. Reports SOB, wheezing. Denies chest tightness, coughing. Was recently in the hospital.    Kimmy was just hospitalized for sepsis from a urinary trach infection and she needed to use her oxygen at home.  She feels like her breathing has been OK at home. She is using Symbicort.  She has no dyspnea at rest, has some dyspnea with walking.  No chest pain or leg swelling.  No cough.  No trouble swallowing.  No weight loss.    No chest pain.   Past Medical History:  Diagnosis Date  . Acute peptic ulcer, unspecified site, with hemorrhage and perforation, without mention of obstruction   . Arthritis   . Asthma   . Balance problems    unsteady on feet at time  . Chronic airway obstruction, not elsewhere classified    on inhalers  . Diverticulosis of colon (without mention of hemorrhage)   . Hematochezia   . 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 Ventral hernia      Review of Systems  Constitutional: Negative for chills, fatigue and fever.  HENT: Negative for congestion, nosebleeds, postnasal drip and rhinorrhea.   Respiratory: Negative  for cough, shortness of breath and wheezing.   Cardiovascular: Negative for chest pain, palpitations and leg swelling.       Objective:   Physical Exam  Vitals:   04/28/16 1038 04/28/16 1039  BP:  126/70  BP Location:  Right Arm  Cuff Size:  Normal  Pulse:  68  SpO2:  94%  Weight: 202 lb (91.6 kg)   Height: 5' (1.524 m)   RA  Gen: comfortable, no  HEENT: NCAT, EOMi PULM: Few crackles left bases, normal effort CV: RRR, no mgr AB: BS+, soft, nontender Ext: warm, no edema  Imaging: May 2014 chest x-ray no acute cardiopulmonary abnormality, questionable hyperinflation 01/2013 CT chest 94mm sub pleural nodule, mild emphysema 01/2013 CT chest 74mm sub pleural nodule, mild emphysema 04/22/2013 CT chest > 29mm nodule unchanged 08/2014 CT chest > nodule unchanged, progressive interstitial infilatrate bilateral lower lobes of uncertain etiology April 2017 high-resolution CT chest: Findings worrisome for slightly progressive but nonspecific interstitial lung disease. 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 (Personally reviewed again 03/18/2016). 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  PFT: June 2014 full pulmonary function test at [facility] ratio 80%, FEV1 1.74 L (101% predicted) total lung capacity is 3.81 L (91% predicted), DLCO 11.0 (56% predicted) 09/2015 PFT repeat > 86% FVC 2.03L (85% pred),  FEV1 1.72L (96% pred), TLC 3.98L (89% pred), DLCO 12.12 (64% pred) November 2017 pulmonary function testing ratio normal, FEV1 1.73 L, 98% predicted, FVC 1.98 L 83% predicted, total lung capacity 3.90 L 87% predicted, DLCO 12.29 65% predicted   Labs: 12/2015 ANA, CCP, RF, SCL-70, Jo-1, SSA, SSB, Aldolase all negative     Assessment & Plan:   ILD (interstitial lung disease) ([place]) She has a mild fibrotic pattern in the bases of her lungs which is suggestive of nonspecific  interstitial pneumonitis. Typically this condition is associated with an underlying connective tissue disease, however we do not believe she has one of those. I explained to her today that the only way to know for certain what happening in her lungs would be to perform an upper lung biopsy. However, considering the discrepancy between the progression of her symptoms and guaiac progression in her CT scan and lung function testing I'm not certain that doing an open lung biopsy is worth the risk right now.  She and I with her daughter talked about various treatment options. We talked about the possibility of treating with empiric steroids versus an open lung biopsy. At this time she would prefer to go the route of empiric steroids.  Plan: Follow-up in 6 weeks with Dr. Chase Caller for a second opinion regarding her interstitial lung disease Start prednisone 30 mg daily for a month, then 20 mg daily for a month, then 10 mg daily for a month Lung function testing in 3 months Follow-up with me in 3 months  Greater than 50% of this 28 minute visit spent face-to-face   Updated Medication List Outpatient Encounter Prescriptions as of 04/28/2016  Medication Sig  . albuterol (PROVENTIL) (2.5 MG/3ML) 0.083% nebulizer solution Take 3 mLs (2.5 mg total) by nebulization every 6 (six) hours as needed for wheezing or shortness of breath.  Marland Kitchen aspirin 81 MG tablet Take 81 mg by mouth daily.  . ciprofloxacin (CIPRO) 250 MG tablet Take 1 tablet (250 mg total) by mouth 2 (two) times daily.  . fluticasone (FLONASE) 50 MCG/ACT nasal spray Place 2 sprays into both nostrils daily.  Marland Kitchen ibuprofen (ADVIL,MOTRIN) 200 MG tablet Take 600 mg by mouth every 6 (six) hours as needed.   . Multiple Vitamin (MULTIVITAMIN WITH MINERALS) TABS tablet Take 1 tablet by mouth daily.  . Probiotic Product (PROBIOTIC PO) Take 1 tablet by mouth daily.  Marland Kitchen rOPINIRole (REQUIP) 3 MG tablet Take 1 tablet (3 mg total) by mouth at bedtime.  . SYMBICORT  160-4.5 MCG/ACT inhaler INHALE 2 PUFFS INTO THE LUNGS 2 (TWO) TIMES DAILY.  . traZODone (DESYREL) 50 MG tablet TAKE ONE AND ONE-HALF TABLETS AT BEDTIME. AS NEEDED FOR SLEEP  . valACYclovir (VALTREX) 1000 MG tablet Take 1 tablet (1,000 mg total) by mouth daily.  . VENTOLIN HFA 108 (90 Base) MCG/ACT inhaler INHALE 2 PUFFS 1 OR 2 MINUTES APART EVERY 6 HOURS AS NEEDED FOR WHEEZING TIGHT COUGH OR SHORTNESS OF BREATH  . predniSONE (DELTASONE) 10 MG tablet 30mg  daily for 4 weeks, then 20mg  daily for 4 weeks, then 10mg  daily for 4 weeks   Facility-Administered Encounter Medications as of 04/28/2016  Medication  . dexamethasone (DECADRON) injection 4 mg  . ondansetron (ZOFRAN) 4 mg in sodium chloride 0.9 % 50 mL IVPB  . ondansetron (ZOFRAN) 4 mg in sodium chloride 0.9 % 50 mL IVPB

## 2016-04-28 NOTE — Patient Instructions (Signed)
Take prednisone 30 mg daily for 4 weeks, then 20 mg daily for 4 weeks, then 10 mg daily for 4 weeks We will arrange for lung function test in 3 months We will have you follow-up with Dr. Chase Caller, our office interstitial lung disease expert in 6 weeks I will see you back in 3 months

## 2016-04-28 NOTE — Addendum Note (Signed)
Addended by: Valerie Salts on: 04/28/2016 11:28 AM   Modules accepted: Orders

## 2016-05-14 ENCOUNTER — Other Ambulatory Visit: Payer: Self-pay | Admitting: Family Medicine
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
## 2016-05-17 NOTE — Telephone Encounter (Signed)
Per DPR left v/m for pt ventolin inhaler sent electronically on 05/17/16 to [facility].

## 2016-05-25 ENCOUNTER — Telehealth: Payer: Self-pay | Admitting: Family Medicine

## 2016-05-25 NOTE — Telephone Encounter (Signed)
Patient Name: Susan [name]  DOB: [birth year]    Initial Comment Caller states, she feels dizzy in her head and has mucus. Verified    Nurse Assessment  Nurse: Raphael Gibney, RN, Vanita Ingles Date/Time Eilene Ghazi Time): 05/25/2016 1:44:21 PM  Confirm and document reason for call. If symptomatic, describe symptoms. ---Caller states she has a lot of sinus congestion. Has a little dizziness in her head. No headache. No fever. Has had sinus congestion 4-5 days. She is taking prednisone and mucinex.  Does the patient have any new or worsening symptoms? ---Yes  Will a triage be completed? ---Yes  Related visit to physician within the last 2 weeks? ---No  Does the PT have any chronic conditions? (i.e. diabetes, asthma, etc.) ---Yes  List chronic conditions. ---lung problems  Is this a behavioral health or substance abuse call? ---No     Guidelines    Guideline Title Affirmed Question Affirmed Notes  Sinus Pain or Congestion [1] Sinus congestion as part of a cold AND [2] present < 10 days (all triage questions negative)    Final Disposition User   Home Care Raphael Gibney, RN, Vanita Ingles    Disagree/Comply: Comply

## 2016-05-25 NOTE — Telephone Encounter (Signed)
PLEASE NOTE: All timestamps contained within this report are represented as [place] Standard Time. CONFIDENTIALTY NOTICE: This fax transmission is intended only for the addressee. It contains information that is legally privileged, confidential or otherwise protected from use or disclosure. If you are not the intended recipient, you are strictly prohibited from reviewing, disclosing, copying using or disseminating any of this information or taking any action in reliance on or regarding this information. If you have received this fax in error, please notify us immediately by telephone so that we can arrange for its return to [place]. Phone: [phone], Toll-Free: [phone], Fax: [phone] Page: 1 of 2 Call Id: [id number] [place] Patient Name: Susan [name] Gender: Female DOB: 12-23-1940 Age: 76 Y 45 M 3 D Return Phone Number: [phone] (Primary) Address: City/State/Zip: [place] [address] Client [facility] [place] Day - Client Client Site [place] - Day Physician Eliezer Lofts - MD Contact Type Call Who Is Calling Patient / Member / Family / Caregiver Call Type Triage / Clinical Relationship To Patient Self Return Phone Number [phone] (Primary) Chief Complaint Dizziness Reason for Call Symptomatic / Request for [place] states, she feels dizzy in her head and has mucus. Verified Appointment Disposition EMR Appointment Not Necessary Info pasted into Epic Yes PreDisposition Call Doctor Translation No Nurse Assessment Nurse: Raphael Gibney, RN, Vanita Ingles Date/Time Eilene Ghazi Time): 05/25/2016 1:44:21 PM Confirm and document reason for call. If symptomatic, describe symptoms. ---Caller states she has a lot of sinus congestion. Has a little dizziness in her head. No headache. No fever. Has had sinus congestion 4-5 days. She is  taking prednisone and mucinex. Does the patient have any new or worsening symptoms? ---Yes Will a triage be completed? ---Yes Related visit to physician within the last 2 weeks? ---No Does the PT have any chronic conditions? (i.e. diabetes, asthma, etc.) ---Yes List chronic conditions. ---lung problems Is this a behavioral health or substance abuse call? ---No Guidelines Guideline Title Affirmed Question Affirmed Notes Nurse Date/Time Eilene Ghazi Time) Sinus Pain or Congestion [1] Sinus congestion as part of a cold AND [2] present < 10 days (all triage questions negative) Raphael Gibney, RN, Vanita Ingles 05/25/2016 1:47:29 PM Disp. Time Eilene Ghazi Time) Disposition Final User 05/25/2016 1:53:53 PM Home Care Yes Raphael Gibney, RN, Vanita Ingles PLEASE NOTE: All timestamps contained within this report are represented as [place] Standard Time. CONFIDENTIALTY NOTICE: This fax transmission is intended only for the addressee. It contains information that is legally privileged, confidential or otherwise protected from use or disclosure. If you are not the intended recipient, you are strictly prohibited from reviewing, disclosing, copying using or disseminating any of this information or taking any action in reliance on or regarding this information. If you have received this fax in error, please notify us immediately by telephone so that we can arrange for its return to [place]. Phone: [phone], Toll-Free: [phone], Fax: [phone] Page: 2 of 2 Call Id: [id number] Caller Understands: Yes Disagree/Comply: Comply Care Advice Given Per Guideline REASSURANCE: HOME CARE: You should be able to treat this at home. * Sinus congestion is a normal part of a cold. * Usually home treatment with nasal washes can prevent an actual bacterial sinus infection. * Sinus congestion (fullness) persists over 10 days CALL BACK IF: * Sinus pain persists over 1 day after using nasal washes * Fever lasts over 3 days * You become worse. CARE ADVICE  given per Sinus  Pain or Congestion (Adult) guideline. FOR A STUFFY NOSE - USE NASAL WASHES: * Introduction: Saline (salt water) nasal irrigation (nasal wash) is an effective and simple home remedy for treating stuffy nose and sinus congestion. The nose can be irrigated by pouring, spraying, or squirting salt water into the nose and then letting it run back out.

## 2016-05-31 NOTE — Telephone Encounter (Signed)
The refill order is still pending on my desktop; on 02/12/16 at 4:17 there is a message in this phone note that you approved the prednisone rx. I cannot find approved prednisone rx on current or hx med list therefore med order is still pending and will not go away until signed.Please advise.

## 2016-05-31 NOTE — Telephone Encounter (Signed)
This was on my desktop for needing signature. I could not find where prednisone was approved on 02/12/16 on current or hx med list. Please advise.

## 2016-05-31 NOTE — Telephone Encounter (Signed)
This looks like an old message, I don't see anything currently needing attention.

## 2016-05-31 NOTE — Telephone Encounter (Signed)
It looks as though [place] discontinued it in 10/17. I can not find a way to cancel the order.

## 2016-06-01 ENCOUNTER — Telehealth: Payer: Self-pay | Admitting: Pulmonary Disease

## 2016-06-01 ENCOUNTER — Telehealth: Payer: Self-pay

## 2016-06-01 NOTE — Telephone Encounter (Signed)
Pt aware of rec's per Dr Elsworth Soho.  Will send to Dr Lake Bells to advise.

## 2016-06-01 NOTE — Telephone Encounter (Signed)
Thank you :)

## 2016-06-01 NOTE — Telephone Encounter (Signed)
Pt saw Dr Lake Bells on 04/28/16 and was started on prednisone. Pt said prednisone is making her crazy; pt does not feel normal in head;pt said she feels goofy in the head; not sure if actually lightheaded. Pt said she fell out of chair while caulking her commode. Pt wants to know what to do. No H/a or vision changes. Transferred pt to [facility] at Dr Kathee Delton office. FYI to Dr Diona Browner.

## 2016-06-01 NOTE — Telephone Encounter (Signed)
She can cut down to 10 mg and stay on this dose until Dr. Lake Bells responds

## 2016-06-01 NOTE — Telephone Encounter (Signed)
Pt states she is not tolerating prednisone- c/o increased dizziness, lightheadedness, "funny in the head" Xcouple of weeks.  Pt is on a long taper dose (started on 04/28/16- 30mg  qd X4 weeks, 20mg  qd X4 weeks, 10mg  qd X4 weeks).   Pt wants to come off of prednisone.   [facility] uses [facility].    Sending to DOD as BQ is unavailable this afternoon.  RA please advise.  Thanks.

## 2016-06-02 NOTE — Telephone Encounter (Signed)
Agreed. Likely need to stop prednisone...but I am not aware of pulmonary status currently. Request Dr. Kathee Delton recommendations.

## 2016-06-02 NOTE — Telephone Encounter (Signed)
Thanks Davonna Belling, agree

## 2016-06-02 NOTE — Telephone Encounter (Signed)
Spoke with pt and advised of Dr Anastasia Pall recommendations to stay on Prednisone 10mg  daily until ov with MR on 06/21/16.  Pt verbalized understanding.  Nothing further needed.

## 2016-06-14 ENCOUNTER — Telehealth: Payer: Self-pay

## 2016-06-14 NOTE — Telephone Encounter (Signed)
Agreed. This was not handled by [facility] correctly.

## 2016-06-14 NOTE — Telephone Encounter (Signed)
Received faxed note from team health that was dated 06/13/16 at 9:19:33 pm. Pt was having trouble breathing and tightness in chest. Pt has appt with Dr Lake Bells on 06/16/16 at 4:15. Faxed note placed in Dr Rometta Emery in box and after review request that [place] note to be scanned into pt's chart. Will send this note to Lorenda Peck RN as well. This [facility] note was not sent to my desktop and was not in [employer] portal.

## 2016-06-16 ENCOUNTER — Ambulatory Visit (INDEPENDENT_AMBULATORY_CARE_PROVIDER_SITE_OTHER): Payer: Medicare HMO | Admitting: Pulmonary Disease

## 2016-06-16 ENCOUNTER — Encounter: Payer: Self-pay | Admitting: Pulmonary Disease

## 2016-06-16 DIAGNOSIS — G4733 Obstructive sleep apnea (adult) (pediatric): Secondary | ICD-10-CM

## 2016-06-16 DIAGNOSIS — J849 Interstitial pulmonary disease, unspecified: Secondary | ICD-10-CM | POA: Diagnosis not present

## 2016-06-16 DIAGNOSIS — J432 Centrilobular emphysema: Secondary | ICD-10-CM | POA: Diagnosis not present

## 2016-06-16 NOTE — Assessment & Plan Note (Signed)
She has not been compliant with her CPAP. She would like to start using this again. Perhaps treating her sleep apnea will help her symptoms. We will send a prescription for a new mask and supplies so she can resume using this.

## 2016-06-16 NOTE — Assessment & Plan Note (Signed)
I am struggling to understand Susan [name]'s dyspnea. She does have some mild interstitial lung disease but an empiric course of steroids did not improve her symptoms. Further, her lung function testing abnormalities have really not declined significantly over the years so it's hard for me to say that these mild abnormalities are the cause of her dyspnea.  She is hesitant to have an open lung biopsy for further diagnosis.  I would like for her to see my colleague is an expert in interstitial lung disease to assess her case. If there is no further workup recommended from his visit then we'll likely need to pursue a right heart catheterization and cardiopulmonary exercise stress test.

## 2016-06-16 NOTE — Progress Notes (Signed)
Subjective:    Patient ID: Susan [name], female    DOB: 03-29-1941, 76 y.o.   MRN: [id number]  Synopsis: Susan [name] first saw the [facility] pulmonary clinic in September 2014 for shortness of breath. She had a previous heavy smoking history but he quit by the time she saw me. Lung function testing was actually not consistent with obstruction but she did have emphysema on her chest CT. Spiriva was started empirically for that. She also had a home sleep study which showed an apnea hypopnea index of 44. A follow-up split-night study showed an AHI of only 18, CPAP was titrated to 10 cm of water but she was only able to wear it for approximately 15 minutes. She has had difficulty using CPAP when prescribed in the past.     HPI  Chief Complaint  Patient presents with  . Acute Visit    Increased shortness of breath. Increased sleepiness.   Susan [name] stopped taking the prednisone about two weeks ago because she said it made her crazy.  She can't even recall how long she took the prednisone but her daughter estimates about 4 weeks.  She says it changed her personality so she stopped it.  She and her family can't tell if it made a difference in her breathing.    She would like to start using CPAP again.  She tells me she is willing to try anything to help her breathe better.    She says that the inhalers don't help much.     Past Medical History:  Diagnosis Date  . Acute peptic ulcer, unspecified site, with hemorrhage and perforation, without mention of obstruction   . Arthritis   . Asthma   . Balance problems    unsteady on feet at time  . Chronic airway obstruction, not elsewhere classified    on inhalers  . Diverticulosis of colon (without mention of hemorrhage)   . Hematochezia   . 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 Ventral hernia      Review of Systems  Constitutional: Negative for chills, fatigue and fever.  HENT: Negative for congestion, nosebleeds,  postnasal drip and rhinorrhea.   Respiratory: Negative for cough, shortness of breath and wheezing.   Cardiovascular: Negative for chest pain, palpitations and leg swelling.       Objective:   Physical Exam  Vitals:   06/16/16 1636  BP: 124/80  BP Location: Left Arm  Patient Position: Sitting  Cuff Size: Normal  Pulse: 75  SpO2: 97%  Weight: 202 lb (91.6 kg)  Height: 5' (1.524 m)  RA   Gen: chronically ill appearing HENT: OP clear,  neck supple PULM: Crackles R base, normal effort, normal percussion CV: RRR, no mgr, trace edema GI: BS+, soft, nontender Derm: no cyanosis or rash Psyche: normal mood and affect   Imaging: May 2014 chest x-ray no acute cardiopulmonary abnormality, questionable hyperinflation 01/2013 CT chest 37mm sub pleural nodule, mild emphysema 01/2013 CT chest 9mm sub pleural nodule, mild emphysema 04/22/2013 CT chest > 77mm nodule unchanged 08/2014 CT chest > nodule unchanged, progressive interstitial infilatrate bilateral lower lobes of uncertain etiology April 2017 high-resolution CT chest: Findings worrisome for slightly progressive but nonspecific interstitial lung disease. 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 (Personally reviewed again 03/18/2016). 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  PFT: June 2014 full pulmonary function test at [facility] ratio 80%, FEV1 1.74 L (  101% predicted) total lung capacity is 3.81 L (91% predicted), DLCO 11.0 (56% predicted) 09/2015 PFT repeat > 86% FVC 2.03L (85% pred), FEV1 1.72L (96% pred), TLC 3.98L (89% pred), DLCO 12.12 (64% pred) November 2017 pulmonary function testing ratio normal, FEV1 1.73 L, 98% predicted, FVC 1.98 L 83% predicted, total lung capacity 3.90 L 87% predicted, DLCO 12.29 65% predicted   Labs: 12/2015 ANA, CCP, RF, SCL-70, Jo-1, SSA, SSB, Aldolase all negative     Assessment &  Plan:   Obstructive sleep apnea She has not been compliant with her CPAP. She would like to start using this again. Perhaps treating her sleep apnea will help her symptoms. We will send a prescription for a new mask and supplies so she can resume using this.  ILD (interstitial lung disease) ([place]) I am struggling to understand Susan [name]'s dyspnea. She does have some mild interstitial lung disease but an empiric course of steroids did not improve her symptoms. Further, her lung function testing abnormalities have really not declined significantly over the years so it's hard for me to say that these mild abnormalities are the cause of her dyspnea.  She is hesitant to have an open lung biopsy for further diagnosis.  I would like for her to see my colleague is an expert in interstitial lung disease to assess her case. If there is no further workup recommended from his visit then we'll likely need to pursue a right heart catheterization and cardiopulmonary exercise stress test.  Centrilobular emphysema ([place]) Continue symbicort   Updated Medication List Outpatient Encounter Prescriptions as of 06/16/2016  Medication Sig  . aspirin 81 MG tablet Take 81 mg by mouth daily.  . fluticasone (FLONASE) 50 MCG/ACT nasal spray Place 2 sprays into both nostrils daily.  Marland Kitchen ibuprofen (ADVIL,MOTRIN) 200 MG tablet Take 600 mg by mouth every 6 (six) hours as needed.   . Multiple Vitamin (MULTIVITAMIN WITH MINERALS) TABS tablet Take 1 tablet by mouth daily.  . Probiotic Product (PROBIOTIC PO) Take 1 tablet by mouth daily.  Marland Kitchen rOPINIRole (REQUIP) 3 MG tablet Take 1 tablet (3 mg total) by mouth at bedtime.  . SYMBICORT 160-4.5 MCG/ACT inhaler INHALE 2 PUFFS INTO THE LUNGS 2 (TWO) TIMES DAILY.  . traZODone (DESYREL) 50 MG tablet TAKE ONE AND ONE-HALF TABLETS AT BEDTIME. AS NEEDED FOR SLEEP  . VENTOLIN HFA 108 (90 Base) MCG/ACT inhaler INHALE 2 PUFFS 1 OR 2 MINUTES APART EVERY 6 HOURS AS NEEDED FOR WHEEZING TIGHT COUGH  OR SHORTNESS OF BREATH  . [DISCONTINUED] ciprofloxacin (CIPRO) 250 MG tablet Take 1 tablet (250 mg total) by mouth 2 (two) times daily.  . [DISCONTINUED] predniSONE (DELTASONE) 10 MG tablet Take 30mg  daily for 4 weeks, then 20mg  daily for 4 weeks, and then 10 daily for 4 weeks.   Facility-Administered Encounter Medications as of 06/16/2016  Medication  . dexamethasone (DECADRON) injection 4 mg  . ondansetron (ZOFRAN) 4 mg in sodium chloride 0.9 % 50 mL IVPB  . ondansetron (ZOFRAN) 4 mg in sodium chloride 0.9 % 50 mL IVPB

## 2016-06-16 NOTE — Assessment & Plan Note (Signed)
Continue symbicort

## 2016-06-16 NOTE — Patient Instructions (Signed)
Start using your CPAP machine again, we will call for new supplies for the machine We will follow-up the results of your visit with Dr. Chase Caller next week I will see you back in 4-6 weeks or sooner if needed

## 2016-06-17 ENCOUNTER — Other Ambulatory Visit: Payer: Self-pay | Admitting: Family Medicine

## 2016-06-17 NOTE — Telephone Encounter (Signed)
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

## 2016-06-17 NOTE — Telephone Encounter (Signed)
Last office visit 04/26/2016. Not on current medication list.  Refill?

## 2016-06-18 ENCOUNTER — Other Ambulatory Visit: Payer: Self-pay | Admitting: Family Medicine

## 2016-06-21 ENCOUNTER — Encounter: Payer: Self-pay | Admitting: Internal Medicine

## 2016-06-21 ENCOUNTER — Ambulatory Visit (INDEPENDENT_AMBULATORY_CARE_PROVIDER_SITE_OTHER): Payer: Medicare HMO | Admitting: Internal Medicine

## 2016-06-21 ENCOUNTER — Telehealth: Payer: Self-pay | Admitting: Internal Medicine

## 2016-06-21 VITALS — BP 138/82 | HR 89 | Ht 60.0 in | Wt 207.0 lb

## 2016-06-21 DIAGNOSIS — J849 Interstitial pulmonary disease, unspecified: Secondary | ICD-10-CM

## 2016-06-21 DIAGNOSIS — R0602 Shortness of breath: Secondary | ICD-10-CM

## 2016-06-21 NOTE — Progress Notes (Signed)
Subjective:    Patient ID: Susan [name], female    DOB: Sep 13, 1940, 76 y.o.   MRN: [id number]  HPI  OV 06/21/2016  Chief Complaint  Patient presents with  . Pulmonary Consult    Pt referred by Dr. Lake Bells for second opinion ILD. Pt c/o DOE. Pt denies CP/tightness and cough.     18 year [place] female. Works as Emergency planning/management officer for multiple decades. Uses walker x few years due to obesity and djd. Prior20 ppd smoker quit 15 years ago.OSA on CPAP.  Per her and daughter she has insidious onsent dyspnea x many years. Progressive lately esp past year. She has to walk hallway to her apartment from the elevate and she notices she is more dyspneic in past year and past few months especially. She is frustrated dyspnea has not been resolved. She is on triple mdi rx for emphysema seen on ct chest. Reports that walk tests in office does not show desaturation. Dyspnea improved with rest. No dyspnea for hair spra use. Denies cough.   There is now concern that she has ILD as part of her problem profile for dyspnea. REview of chart shows that in June 2014 she had isolated reduced DLCO 11/56% and CT chest in 2014 x 3 CTs showed mild emphysema only. Then in 09/22/14 CT non high res suggested bilateral bibasal ILD which on HRCT a year later seemed to be slightly increased since a year earlier (personally visualized) . However, PFT at this time while did show drop in FVC to 85% (though still normal) showed stable dlco at 64% compared to 2014,. She had fu CT chest HRCT nov 2014(visualized) and again showed R> L bibasal subtle ILD fidingins but to me and to radiology looked improved (DLCO still unchanged)  Despite stability in her DLCO and possible improvement in CT chest her dyspnea continues to decline though she has never desatrated. She is worried about the need for lung biopsy and risks associated with it. REview of DR Lake Bells noites and summarizatio of it from end 2017: he tried empiric prednisne but she gave up on  it in a few weeks due to lack of efficacy and side effets ("made me crazier")   Exposure hx  - hair spray exposure x many decades  - denies mold, mildew, organic dust, asbestos expsure    Results for TYLEEN, RECKERS (MRN [id number]) as of 06/21/2016 11:30  Ref. Range 01/11/2016 17:28  Anit Nuclear Antibody(ANA) Latest Ref Range: NEGATIVE  NEG  Anti JO-1 Latest Ref Range: 0.0 - 0.9 AI [id number]  Cyclic Citrullin Peptide Ab Latest Units: Units <16  RA Latex Turbid. Latest Ref Range: <=14 IU/mL <10  SSA (Ro) (ENA) Antibody, IgG Latest Ref Range: <1.0 NEG AI  <1.0 NEG  SSB (La) (ENA) Antibody, IgG Latest Ref Range: <1.0 NEG AI  <1.0 NEG  Scleroderma (Scl-70) (ENA) Antibody, IgG Latest Ref Range: <1.0 NEG AI  <1.0 NEG   Results for SYNDA, BUCCILLI (MRN [id number]) as of 06/21/2016 11:30  Ref. Range 03/18/2016 15:02 04/05/2016 12:50  TLC Latest Units: L 4.15 3.90  TLC % pred Latest Units: % 93 87  Results for ARISHA, NOORDA (MRN [id number]) as of 06/21/2016 11:30  Ref. Range 03/18/2016 15:02 04/05/2016 12:50  DLCO cor Latest Units: ml/min/mmHg 12.11 12.11  DLCO cor % pred Latest Units: % 64 64   Results for SHERRAN, MEDLAR (MRN [id number]) as of 06/21/2016 11:30  Ref. Range 03/18/2016 15:02 04/05/2016 12:50  FVC-Pre Latest Units: L  2.04 1.90  FVC-%Pred-Pre Latest Units: % 86 80     has a past medical history of Acute peptic ulcer, unspecified site, with hemorrhage and perforation, without mention of obstruction; Arthritis; Asthma; Balance problems; Chronic airway obstruction, not elsewhere classified; Diverticulosis of colon (without mention of hemorrhage); Hematochezia; Herpes simplex without mention of complication; and Ventral hernia.   reports that she quit smoking about 15 years ago. Her smoking use included Cigarettes. She has a 20.00 pack-year smoking history. She has never used smokeless tobacco.  Past Surgical History:  Procedure Laterality Date  . APPENDECTOMY    .  BUNIONECTOMY Bilateral   . COSMETIC SURGERY  1970's   abdomen. Tummy Tuck  . FOOT SURGERY Bilateral dec 2015 right    left 3 yrs ago  . TOTAL HIP ARTHROPLASTY  2007   left  . TOTAL HIP ARTHROPLASTY     bilateral   . TOTAL KNEE ARTHROPLASTY Right 08/08/2014   Procedure: RIGHT TOTAL KNEE ARTHROPLASTY WITH NAVIGATION;  Surgeon: Rod Can, MD;  Location: [facility];  Service: Orthopedics;  Laterality: Right;  . TOTAL KNEE ARTHROPLASTY Left 10/03/2014   Procedure: LEFT TOTAL KNEE ARTHROPLASTY;  Surgeon: Rod Can, MD;  Location: [facility];  Service: Orthopedics;  Laterality: Left;  . TUBAL LIGATION    . UMBILICAL HERNIA REPAIR     2016    No Known Allergies  Immunization History  Administered Date(s) Administered  . Influenza Split 03/12/2012  . Influenza Whole 03/25/2009  . Influenza,inj,Quad PF,36+ Mos 01/22/2013, 03/03/2014, 02/10/2015, 01/12/2016  . Pneumococcal Conjugate-13 03/03/2014  . Pneumococcal Polysaccharide-23 06/18/2008  . Td 06/18/2008  . Zoster 05/23/2005    Family History  Problem Relation Age of Onset  . Diabetes Mother   . Coronary artery disease Mother   . Lung disease Mother   . Heart disease Mother     CAD  . Coronary artery disease Father   . Kidney disease Father   . Diabetes Father   . Heart disease Father     CAD  . Cancer Neg Hx      Current Outpatient Prescriptions:  .  aspirin 81 MG tablet, Take 81 mg by mouth daily., Disp: , Rfl:  .  fluticasone (FLONASE) 50 MCG/ACT nasal spray, Place 2 sprays into both nostrils daily., Disp: 16 g, Rfl: 6 .  ibuprofen (ADVIL,MOTRIN) 200 MG tablet, Take 600 mg by mouth every 6 (six) hours as needed. , Disp: , Rfl:  .  Multiple Vitamin (MULTIVITAMIN WITH MINERALS) TABS tablet, Take 1 tablet by mouth daily., Disp: , Rfl:  .  Probiotic Product (PROBIOTIC PO), Take 1 tablet by mouth daily., Disp: , Rfl:  .  rOPINIRole (REQUIP) 3 MG tablet, Take 1 tablet (3 mg total) by mouth at bedtime., Disp: 30 tablet, Rfl:  3 .  SYMBICORT 160-4.5 MCG/ACT inhaler, INHALE 2 PUFFS INTO THE LUNGS 2 (TWO) TIMES DAILY., Disp: 10.2 g, Rfl: 5 .  traZODone (DESYREL) 50 MG tablet, TAKE ONE AND ONE-HALF TABLETS AT BEDTIME. AS NEEDED FOR SLEEP, Disp: 45 tablet, Rfl: 3 .  valACYclovir (VALTREX) 1000 MG tablet, TAKE ONE TABLET BY MOUTH EVERY DAY, Disp: 20 tablet, Rfl: 1 .  VENTOLIN HFA 108 (90 Base) MCG/ACT inhaler, INHALE 2 PUFFS 1 OR 2 MINUTES APART EVERY 6 HOURS AS NEEDED FOR WHEEZING TIGHT COUGH OR SHORTNESS OF BREATH, Disp: 18 each, Rfl: 1 No current facility-administered medications for this visit.   Facility-Administered Medications Ordered in Other Visits:  .  dexamethasone (DECADRON) injection 4 mg, 4  mg, Intravenous, Once, Rod Can, MD .  ondansetron [facility]) 4 mg in sodium chloride 0.9 % 50 mL IVPB, 4 mg, Intravenous, Once, Rod Can, MD .  ondansetron (ZOFRAN) 4 mg in sodium chloride 0.9 % 50 mL IVPB, 4 mg, Intravenous, Once, Rod Can, MD   Review of Systems  Constitutional: Negative for fever and unexpected weight change.  HENT: Negative for congestion, dental problem, ear pain, nosebleeds, postnasal drip, rhinorrhea, sinus pressure, sneezing, sore throat and trouble swallowing.   Eyes: Negative for redness and itching.  Respiratory: Positive for shortness of breath. Negative for cough, chest tightness and wheezing.   Cardiovascular: Negative for palpitations and leg swelling.  Gastrointestinal: Negative for nausea and vomiting.  Genitourinary: Negative for dysuria.  Musculoskeletal: Negative for joint swelling.  Skin: Negative for rash.  Neurological: Negative for headaches.  Hematological: Does not bruise/bleed easily.  Psychiatric/Behavioral: Negative for dysphoric mood. The patient is not nervous/anxious.        Objective:   Physical Exam  Constitutional: She is oriented to person, place, and time. She appears well-developed and well-nourished. No distress.  obese  HENT:  Head:  Normocephalic and atraumatic.  Right Ear: External ear normal.  Left Ear: External ear normal.  Mouth/Throat: Oropharynx is clear and moist. No oropharyngeal exudate.  Small scar upper left lip  Eyes: Conjunctivae and EOM are normal. Pupils are equal, round, and reactive to light. Right eye exhibits no discharge. Left eye exhibits no discharge. No scleral icterus.  Neck: Normal range of motion. Neck supple. No JVD present. No tracheal deviation present. No thyromegaly present.  Cardiovascular: Normal rate, regular rhythm, normal heart sounds and intact distal pulses.  Exam reveals no gallop and no friction rub.   No murmur heard. Pulmonary/Chest: Effort normal and breath sounds normal. No respiratory distress. She has no wheezes. She has no rales. She exhibits no tenderness.  Not sure if there are crckles at base  Abdominal: Soft. Bowel sounds are normal. She exhibits no distension and no mass. There is no tenderness. There is no rebound and no guarding.  Musculoskeletal: Normal range of motion. She exhibits no edema or tenderness.  Has walker  Lymphadenopathy:    She has no cervical adenopathy.  Neurological: She is alert and oriented to person, place, and time. She has normal reflexes. No cranial nerve deficit. She exhibits normal muscle tone. Coordination normal.  Skin: Skin is warm and dry. No rash noted. She is not diaphoretic. No erythema. No pallor.  Psychiatric: She has a normal mood and affect. Her behavior is normal. Judgment and thought content normal.  Vitals reviewed.   Vitals:   06/21/16 1121  BP: 138/82  Pulse: 89  SpO2: 96%  Weight: 207 lb (93.9 kg)  Height: 5' (1.524 m)   Estimated body mass index is 40.43 kg/m as calculated from the following:   Height as of this encounter: 5' (1.524 m).   Weight as of this encounter: 207 lb (93.9 kg).       Assessment & Plan:     ICD-9-CM ICD-10-CM   1. ILD (interstitial lung disease) ([place]) 515 J84.9 CT CHEST HIGH  RESOLUTION  2. Shortness of breath 786.05 R06.02    Difficult siutation. Her abnormal PFT can be due toobesity and emphysema. Odd that 2014 through spring 2016 when ILD emerged new that DLCO would be the same. Then spring 2016 -> spring 2017 with possible progressin DLCO stil same and then spring 2016 -> Nov 2017 possible improvement on CT that dlco  stil same. Maybe DLCO inaccurate.  In any event, given possible improvement (? Spontaneous) on Nov 2017 CT best to repeat HRCT supine and prone in march 2018 when she hsa fu with Dr Lake Bells and PFt appt. If at that time if there is definitive ILD and esp if is worsening then low threshold to biopsy - risks of rare ILD flare up, pain, and infection etc., discussed and anticipated post op course. Explained that given lack of continuous o2 need that risk is lower but her obesity, and DJD do increase risk. Defnitely not prohibitive  She and daghter agreeable with plan  > 50% of this > 25 min visit spent in face to face counseling or coordination of care    Dr. Brand Males, M.D., [facility].C.P Pulmonary and Critical Care Medicine Staff Physician [place] [facility] Pager: [phone], If no answer or between  15:00h - 7:00h: call [phone]  06/21/2016 1:45 PM

## 2016-06-21 NOTE — Telephone Encounter (Signed)
HRCT order re-placed to reflect the need for supine and prone images.   PCC's please advise if anything needs to be done, pt is scheduled for HRCT with old order, need new HRCT order linked to appt time. Thanks.

## 2016-06-21 NOTE — Telephone Encounter (Signed)
Spoke with Daneil Dan. States that his questionnaire is a form that MR has his patient's fill out. Per Daneil Dan, message will be routed to her to ensure that MR's message is taken care of.

## 2016-06-21 NOTE — Telephone Encounter (Signed)
Elise pleease ensure tht hrct in marcch is supine and prone  Dr. Brand Males, M.D., [facility].C.P Pulmonary and Critical Care Medicine Staff Physician [place] [facility] Pager: [phone], If no answer or between  15:00h - 7:00h: call [phone]  06/21/2016 1:42 PM

## 2016-06-21 NOTE — Patient Instructions (Addendum)
ICD-9-CM ICD-10-CM   1. ILD (interstitial lung disease) ([place]) 515 J84.9   2. Shortness of breath 786.05 R06.02     The inflammatory process In your lung might be improving spontaneously  Respect and support  your decision to hold off prednisone after trying it for few weeks due to side efects and lack of efficacy  Do HRCT chest mid-march 2017 before you see Dr Lake Bells  AT followup with Dr Lake Bells if ILD is worsening or significant or definitive on CT then consider lung biopsy but if stable or improving do expectant followup  - we discussed risks and benefits and limitations  Your shortness of breath is multifactorial  Followup Future Appointments Date Time Provider [place]  06/24/2016 10:30 AM Amy Cletis [place], MD [facility]  08/03/2016 10:00 AM [facility] PFT ROOM [facility] None  08/03/2016 11:15 AM Juanito Doom, MD [facility] None

## 2016-06-22 NOTE — Telephone Encounter (Signed)
Order placed in pcc defer box to scheduled for 3/18 Joellen [place]

## 2016-06-24 ENCOUNTER — Ambulatory Visit: Payer: Medicare Other | Admitting: Family Medicine

## 2016-06-29 ENCOUNTER — Ambulatory Visit: Payer: Medicare HMO | Admitting: Cardiovascular Disease

## 2016-07-06 ENCOUNTER — Encounter: Payer: Self-pay | Admitting: Cardiovascular Disease

## 2016-07-06 ENCOUNTER — Ambulatory Visit (INDEPENDENT_AMBULATORY_CARE_PROVIDER_SITE_OTHER): Payer: Medicare HMO | Admitting: Cardiovascular Disease

## 2016-07-06 VITALS — BP 138/78 | HR 80 | Ht 60.0 in | Wt 211.2 lb

## 2016-07-06 DIAGNOSIS — J849 Interstitial pulmonary disease, unspecified: Secondary | ICD-10-CM

## 2016-07-06 DIAGNOSIS — R55 Syncope and collapse: Secondary | ICD-10-CM | POA: Diagnosis not present

## 2016-07-06 DIAGNOSIS — Z01812 Encounter for preprocedural laboratory examination: Secondary | ICD-10-CM

## 2016-07-06 DIAGNOSIS — R0602 Shortness of breath: Secondary | ICD-10-CM

## 2016-07-06 DIAGNOSIS — I200 Unstable angina: Secondary | ICD-10-CM

## 2016-07-06 DIAGNOSIS — J9601 Acute respiratory failure with hypoxia: Secondary | ICD-10-CM

## 2016-07-06 DIAGNOSIS — E7800 Pure hypercholesterolemia, unspecified: Secondary | ICD-10-CM

## 2016-07-06 DIAGNOSIS — I700 Atherosclerosis of aorta: Secondary | ICD-10-CM

## 2016-07-06 DIAGNOSIS — I25110 Atherosclerotic heart disease of native coronary artery with unstable angina pectoris: Secondary | ICD-10-CM

## 2016-07-06 DIAGNOSIS — F5104 Psychophysiologic insomnia: Secondary | ICD-10-CM

## 2016-07-06 NOTE — Patient Instructions (Addendum)
We will schedule a cardiac cath for Friday for unstable angina [facility] Cardiac Cath Instructions   You are scheduled for a Cardiac Cath on:___Friday, Feb 16___  Please arrive at 9:30 am on the day of your procedure  Please expect a call from our [place] to pre-register you  Do not eat/drink anything after midnight  Someone will need to drive you home  It is recommended someone be with you for the first 24 hours after your procedure  Wear clothes that are easy to get on/off and wear slip on shoes if possible  Medications bring a current list of all medications with you  Day of your procedure: Arrive at the [place] entrance.  Free valet service is available.  After entering the [place] please check-in at the registration desk (1st desk on your right) to receive your armband. After receiving your armband someone will escort you to the cardiac cath/special procedures waiting area.  The usual length of stay after your procedure is about 2 to 3 hours.  This can vary.  If you have any questions, please call our office at [phone], or you may call the cardiac cath lab at [facility] directly at [phone]  Medication Instructions:   No medication changes made  Labwork:  No new labs needed  Testing/Procedures:  No further testing at this time   I recommend watching educational videos on topics of interest to you at:       [email or web address]  Enter code: HEARTCARE    Follow-Up: It was a pleasure seeing you in the office today. Please call us if you have new issues that need to be addressed before your next appt.  [phone]  Your physician wants you to follow-up in: 2 weeks  If you need a refill on your cardiac medications before your next appointment, please call your pharmacy.    Angiogram An angiogram is an X-ray test. It is used to look at your blood vessels. For this test, a dye is put into the blood vessel being checked. The dye shows up  on X-rays. It helps your doctor see if there is a blockage or other problem in the blood vessel. What happens before the procedure?  Follow your doctor's instructions about limiting what you eat or drink.  Ask your doctor if you may drink enough water to take any needed medicines the morning of the test.  Plan to have someone take you home after the test.  If you go home the same day as the test, plan to have someone stay with you for 24 hours. What happens during the procedure?  An IV tube will be put into one of your veins.  You will be given a medicine that makes you relax (sedative).  Your skin will be washed where the thin tube (catheter) will be put in. Hair may be removed from this area. The tube may be put into:  Your upper leg area (groin).  The fold of your arm, near your elbow.  Your wrist.  You will be given a medicine that numbs the area where the tube will be inserted (local anesthetic).  The tube will be inserted into a blood vessel.  Using a type of X-ray (fluoroscopy) to see, your doctor will move the tube into the blood vessel to check it.  Dye will be put in through the tube. X-rays of your blood vessels will then be taken. Different doctors and hospitals may do this procedure differently. What happens after  the procedure?  If the test is done through the leg, you will be kept in bed lying flat for several hours. You will be told to not bend or cross your legs.  The area where the tube was inserted will be checked often.  The pulse in your feet or wrist will be checked often.  More tests or X-rays may be done. This information is not intended to replace advice given to you by your health care provider. Make sure you discuss any questions you have with your health care provider. Document Released: 08/05/2008 Document Revised: 10/15/2015 Document Reviewed: 10/10/2012 Elsevier Interactive Patient Education  2017 [place] After These  instructions give you information about caring for yourself after your procedure. Your doctor may also give you more specific instructions. Call your doctor if you have any problems or questions after your procedure. Follow these instructions at home:  Take medicines only as told by your doctor.  Follow your doctor's instructions about:  Care of the area where the tube was inserted.  Bandage (dressing) changes and removal.  You may shower 24-48 hours after the procedure or as told by your doctor.  Do not take baths, swim, or use a hot tub until your doctor approves.  Every day, check the area where the tube was inserted. Watch for:  Redness, swelling, or pain.  Fluid, blood, or pus.  Do not apply powder or lotion to the site.  Do not lift anything that is heavier than 10 lb (4.5 kg) for 5 days or as told by your doctor.  Ask your doctor when you can:  Return to work or school.  Do physical activities or play sports.  Have sex.  Do not drive or operate heavy machinery for 24 hours or as told by your doctor.  Have someone with you for the first 24 hours after the procedure.  Keep all follow-up visits as told by your doctor. This is important. Contact a health care provider if:  You have a fever.  You have chills.  You have more bleeding from the area where the tube was inserted. Hold pressure on the area.  You have redness, swelling, or pain in the area where the tube was inserted.  You have fluid or pus coming from the area. Get help right away if:  You have a lot of pain in the area where the tube was inserted.  The area where the tube was inserted is bleeding, and the bleeding does not stop after 30 minutes of holding steady pressure on the area.  The area near or just beyond the insertion site becomes pale, cool, tingly, or numb. This information is not intended to replace advice given to you by your health care provider. Make sure you discuss any questions  you have with your health care provider. Document Released: 08/05/2008 Document Revised: 10/15/2015 Document Reviewed: 10/10/2012 Elsevier Interactive Patient Education  2017 [employer].

## 2016-07-06 NOTE — Progress Notes (Signed)
Cardiology Office Note  Date:  07/06/2016   ID:  MURSAL SELL, DOB 12-31-1940, MRN [id number]  PCP:  Eliezer Lofts, MD   Chief Complaint  Patient presents with  . ohter    C/o sob. Meds reviewed verbally with pt.    HPI:  Susan [name] is a very pleasant 76 year-old woman with a long history of smoking,  20 ppd smoker quit 15 years ago, OSA who does not wear her CPAP , COPD on triple inhaler regiment, status post bilateral hip replacement, walk tests in office does not show desaturation,    morbid obesity who presents by referral from Dr. Diona Browner for consultation of her severe shortness of breath, chest tightness with exertion  She reports long history of shortness of breath, much worse recently Unable to walk very far without having to stop, gasp for breath After sitting for a period of time, she is again able to walk a short of distance When she is short of breath, some chest tightness, The symptoms have been getting worse now to where she is hoping we can do something.  She's had numerous CT scans showing mild emphysema There was some concern of by basilar ILD Seen by Dr. Lake Bells and Dr. Ashby Dawes  Exposure hx  - hair spray exposure x many decades   She did have previous stress test August 2014 showing no ischemia  CT scan images reviewed with her in detail, images pulled up in the office These show significant long region of heavy coronary calcification noted in the proximal to mid LAD and proximal RCA .no significant calcifications noted in the left circumflex.    She's unable to treadmill secondary to arthritic pain  She reports that her husband died some time ago,  Previousam shows moderate concentric LVH, normal LV systolic function, diastolic dysfunction, borderline to mild pulmonary hypertension.  EKG shows sinus tachycardia with rate 80 beats per minute , borderline Q waves 1 and aVL, left axis deviation, criteria for LVH, poor R-wave progression through the  anterior precordial leads, unable to exclude old anterior MI   PMH:   has a past medical history of Acute peptic ulcer, unspecified site, with hemorrhage and perforation, without mention of obstruction; Arthritis; Asthma; Balance problems; Chronic airway obstruction, not elsewhere classified; Diverticulosis of colon (without mention of hemorrhage); Hematochezia; Herpes simplex without mention of complication; and Ventral hernia.  PSH:    Past Surgical History:  Procedure Laterality Date  . APPENDECTOMY    . BUNIONECTOMY Bilateral   . COSMETIC SURGERY  1970's   abdomen. Tummy Tuck  . FOOT SURGERY Bilateral dec 2015 right    left 3 yrs ago  . TOTAL HIP ARTHROPLASTY  2007   left  . TOTAL HIP ARTHROPLASTY     bilateral   . TOTAL KNEE ARTHROPLASTY Right 08/08/2014   Procedure: RIGHT TOTAL KNEE ARTHROPLASTY WITH NAVIGATION;  Surgeon: Rod Can, MD;  Location: [facility];  Service: Orthopedics;  Laterality: Right;  . TOTAL KNEE ARTHROPLASTY Left 10/03/2014   Procedure: LEFT TOTAL KNEE ARTHROPLASTY;  Surgeon: Rod Can, MD;  Location: [facility];  Service: Orthopedics;  Laterality: Left;  . TUBAL LIGATION    . UMBILICAL HERNIA REPAIR     2016    Current Outpatient Prescriptions  Medication Sig Dispense Refill  . aspirin 81 MG tablet Take 81 mg by mouth daily.    . fluticasone (FLONASE) 50 MCG/ACT nasal spray Place 2 sprays into both nostrils daily. 16 g 6  . ibuprofen (ADVIL,MOTRIN) 200  MG tablet Take 600 mg by mouth every 6 (six) hours as needed.     . Multiple Vitamin (MULTIVITAMIN WITH MINERALS) TABS tablet Take 1 tablet by mouth daily.    . Probiotic Product (PROBIOTIC PO) Take 1 tablet by mouth daily.    Marland Kitchen rOPINIRole (REQUIP) 3 MG tablet Take 1 tablet (3 mg total) by mouth at bedtime. 30 tablet 3  . SYMBICORT 160-4.5 MCG/ACT inhaler INHALE 2 PUFFS INTO THE LUNGS 2 (TWO) TIMES DAILY. 10.2 g 5  . traZODone (DESYREL) 50 MG tablet TAKE ONE AND ONE-HALF TABLETS AT BEDTIME. AS NEEDED FOR  SLEEP 45 tablet 3  . valACYclovir (VALTREX) 1000 MG tablet TAKE ONE TABLET BY MOUTH EVERY DAY 20 tablet 1  . VENTOLIN HFA 108 (90 Base) MCG/ACT inhaler INHALE 2 PUFFS 1 OR 2 MINUTES APART EVERY 6 HOURS AS NEEDED FOR WHEEZING TIGHT COUGH OR SHORTNESS OF BREATH 18 each 1   No current facility-administered medications for this visit.    Facility-Administered Medications Ordered in Other Visits  Medication Dose Route Frequency Provider Last Rate Last Dose  . dexamethasone (DECADRON) injection 4 mg  4 mg Intravenous Once Rod Can, MD      . ondansetron [facility]) 4 mg in sodium chloride 0.9 % 50 mL IVPB  4 mg Intravenous Once Rod Can, MD      . ondansetron (ZOFRAN) 4 mg in sodium chloride 0.9 % 50 mL IVPB  4 mg Intravenous Once Rod Can, MD         Allergies:   Patient has no known allergies.   Social History:  The patient  reports that she quit smoking about 15 years ago. Her smoking use included Cigarettes. She has a 20.00 pack-year smoking history. She has never used smokeless tobacco. She reports that she does not drink alcohol or use drugs.   Family History:   family history includes Coronary artery disease in her father and mother; Diabetes in her father and mother; Heart disease in her father and mother; Kidney disease in her father; Lung disease in her mother.    Review of Systems: Review of Systems  Constitutional: Negative.   Respiratory: Positive for shortness of breath.   Cardiovascular: Negative.        Chest tightness with exertion  Gastrointestinal: Negative.   Musculoskeletal: Negative.   Neurological: Negative.   Psychiatric/Behavioral: Negative.   All other systems reviewed and are negative.    PHYSICAL EXAM: VS:  BP 138/78 (BP Location: Right Arm, Patient Position: Sitting, Cuff Size: Normal)   Pulse 80   Ht 5' (1.524 m)   Wt 211 lb 4 oz (95.8 kg)   BMI 41.26 kg/m  , BMI Body mass index is 41.26 kg/m. GEN: Well nourished, well developed, in no  acute distress,  morbidly obese  HEENT: normal  Neck: no JVD, carotid bruits, or masses Cardiac: RRR; no murmurs, rubs, or gallops,no edema  Respiratory:  clear to auscultation bilaterally, normal work of breathing GI: soft, nontender, nondistended, + BS MS: no deformity or atrophy  Skin: warm and dry, no rash Neuro:  Strength and sensation are intact Psych: euthymic mood, full affect    Recent Labs: 03/18/2016: NT-Pro BNP 298 04/17/2016: B Natriuretic Peptide 166.0 04/18/2016: ALT 30; BUN 22; Creatinine, Ser 0.92; Magnesium 1.9; Potassium 3.7; Sodium 140; TSH 0.813 04/21/2016: Hemoglobin 12.6; Platelets 129    Lipid Panel Lab Results  Component Value Date   CHOL 126 04/18/2016   HDL 44 04/18/2016   LDLCALC 74 04/18/2016  TRIG 40 04/18/2016      Wt Readings from Last 3 Encounters:  07/06/16 211 lb 4 oz (95.8 kg)  06/21/16 207 lb (93.9 kg)  06/16/16 202 lb (91.6 kg)       ASSESSMENT AND PLAN:  HYPERCHOLESTEROLEMIA - Plan: EKG XX123456, Basic Metabolic Panel (BMET), CBC with Differential/Platelet, INR/PT Cholesterol is actually quite low with no statin medication, 126 at the end of 2017  Near syncope - Plan: EKG XX123456, Basic Metabolic Panel (BMET), CBC with Differential/Platelet, INR/PT  SOB (shortness of breath) - Plan: EKG XX123456, Basic Metabolic Panel (BMET), CBC with Differential/Platelet, INR/PT Severe shortness of breath, chest tightness when she has the symptoms, worse with exertion concerning for unstable angina plan as below, scheduled for catheterization given CT findings and her symptoms.  patient prefers catheterization. Would likely have significant attenuation artifact given morbid obesity   Pre-procedure lab exam - Plan: Basic Metabolic Panel (BMET), CBC with Differential/Platelet, INR/PT  Unstable angina ([place]) As detailed below, plan for cardiac catheterization given unstable anginal symptoms  Acute respiratory failure with hypoxia  (HCC) Worsening shortness of breath recently, no acute exacerbations of her respiratory status  ILD (interstitial lung disease) ([place]) Followed by pulmonary.   Chronic insomnia Recommended she talk with pulmonary about restarting her CPAP She is noncompliant, significant fatigue in the daytime  Coronary artery disease involving native coronary artery of native heart with unstable angina pectoris ([place]) She has very heavy coronary calcifications in proximal to mid LAD and proximal RCA on CT scan. Symptoms concerning for unstable angina. Getting worse over the past several months. Prior smoking history, hyperlipidemia Unable to treadmill, morbid obesity with arthritic symptoms Discussed the various treatment options with her including repeat Myoview as she had in 2014 or cardiac catheterization. She reports her symptoms are severe, does not want stress test, prefers cardiac catheterization given the severity and progression of her symptoms We have discussed risk and benefit of cardiac catheterization with her I have reviewed the risks, indications, and alternatives to cardiac catheterization, possible angioplasty, and stenting with the patient. Risks include but are not limited to bleeding, infection, vascular injury, stroke, myocardial infection, arrhythmia, kidney injury, radiation-related injury in the case of prolonged fluoroscopy use, emergency cardiac surgery, and death. The patient understands the risks of serious complication is 1-2 in [id number] with diagnostic cardiac cath and 1-2% or less with angioplasty/stenting.  She is willing to proceed with cardiac catheterization This will be scheduled for Friday, February 16 Lab work ordered in preparation  Aortic atherosclerosis [facility]) On CT scan she has moderate diffuse aortic atherosclerosis She will benefit from a statin, low-dose aspirin   Total encounter time more than 60 minutes  Greater than 50% was spent in counseling and coordination of  care with the patient   Disposition:   F/U  1 month   Orders Placed This Encounter  Procedures  . Basic Metabolic Panel (BMET)  . CBC with Differential/Platelet  . INR/PT  . EKG 12-Lead     Signed, Esmond Plants, M.D., Ph.D. 07/06/2016  [place], [place]

## 2016-07-07 ENCOUNTER — Other Ambulatory Visit: Payer: Self-pay | Admitting: Cardiovascular Disease

## 2016-07-07 ENCOUNTER — Telehealth: Payer: Self-pay

## 2016-07-07 DIAGNOSIS — I200 Unstable angina: Secondary | ICD-10-CM

## 2016-07-07 LAB — BASIC METABOLIC PANEL
BUN/Creatinine Ratio: 27 (ref 12–28)
BUN: 18 mg/dL (ref 8–27)
CALCIUM: 8.7 mg/dL (ref 8.7–10.3)
CHLORIDE: 105 mmol/L (ref 96–106)
CO2: 23 mmol/L (ref 18–29)
Creatinine, Ser: 0.66 mg/dL (ref 0.57–1.00)
GFR calc Af Amer: 101 mL/min/{1.73_m2} (ref >59)
GFR, EST NON AFRICAN AMERICAN: 87 mL/min/{1.73_m2} (ref >59)
GLUCOSE: 108 mg/dL — AB (ref 65–99)
POTASSIUM: 4.7 mmol/L (ref 3.5–5.2)
SODIUM: 144 mmol/L (ref 134–144)

## 2016-07-07 LAB — CBC WITH DIFFERENTIAL/PLATELET
BASOS ABS: 0 10*3/uL (ref 0.0–0.2)
Basos: 0 %
EOS (ABSOLUTE): 0.3 10*3/uL (ref 0.0–0.4)
Eos: 5 %
Hematocrit: 40.3 % (ref 34.0–46.6)
Hemoglobin: 12.9 g/dL (ref 11.1–15.9)
IMMATURE GRANS (ABS): 0 10*3/uL (ref 0.0–0.1)
IMMATURE GRANULOCYTES: 0 %
LYMPHS: 18 %
Lymphocytes Absolute: 1.2 10*3/uL (ref 0.7–3.1)
MCH: 29.2 pg (ref 26.6–33.0)
MCHC: 32 g/dL (ref 31.5–35.7)
MCV: 91 fL (ref 79–97)
MONOS ABS: 0.5 10*3/uL (ref 0.1–0.9)
Monocytes: 8 %
Neutrophils Absolute: 4.5 10*3/uL (ref 1.4–7.0)
Neutrophils: 69 %
PLATELETS: 171 10*3/uL (ref 150–379)
RBC: 4.42 x10E6/uL (ref 3.77–5.28)
RDW: 15.7 % — AB (ref 12.3–15.4)
WBC: 6.5 10*3/uL (ref 3.4–10.8)

## 2016-07-07 LAB — PROTIME-INR
INR: 1 (ref 0.8–1.2)
Prothrombin Time: 10.7 s (ref 9.1–12.0)

## 2016-07-07 MED ORDER — ALBUTEROL SULFATE HFA 108 (90 BASE) MCG/ACT IN AERS: INHALATION_SPRAY | RESPIRATORY_TRACT | 1 refills | Status: DC

## 2016-07-07 NOTE — Addendum Note (Signed)
Addended by: Helene Shoe on: 07/07/2016 01:01 PM   Modules accepted: Orders

## 2016-07-07 NOTE — Telephone Encounter (Signed)
Susan [name] left v/m requesting refill albuterol inhaler; [facility] and she will ck for available refill at [facility] [place].

## 2016-07-07 NOTE — Telephone Encounter (Addendum)
Pt called to ck on status of inhaler refill; pt has already gotten the refill at [facility]. Pt saw cardiologist on 07/06/16 and to have cardiac cath on 07/08/16. Pt having problems breathing, SOB but no wheezing or CP now. Dr Diona Browner said OK to refill ventolin inhaler. Advised pt done and she will ck with pharmacy to pick up med.

## 2016-07-08 ENCOUNTER — Encounter
Admission: RE | Disposition: A | Discharge status: Home / Self Care | Payer: Self-pay | Source: Ambulatory Visit | Attending: Cardiovascular Disease

## 2016-07-08 ENCOUNTER — Ambulatory Visit
Admission: RE | Admit: 2016-07-08 | Discharge: 2016-07-08 | Disposition: A | Discharge status: Home / Self Care | Payer: Medicare HMO | Source: Ambulatory Visit | Attending: Cardiovascular Disease | Admitting: Cardiovascular Disease

## 2016-07-08 ENCOUNTER — Encounter: Payer: Self-pay | Admitting: *Deleted

## 2016-07-08 ENCOUNTER — Other Ambulatory Visit: Payer: Self-pay | Admitting: Cardiovascular Disease

## 2016-07-08 DIAGNOSIS — Z836 Family history of other diseases of the respiratory system: Secondary | ICD-10-CM | POA: Diagnosis not present

## 2016-07-08 DIAGNOSIS — E782 Mixed hyperlipidemia: Secondary | ICD-10-CM

## 2016-07-08 DIAGNOSIS — J449 Chronic obstructive pulmonary disease, unspecified: Secondary | ICD-10-CM | POA: Diagnosis not present

## 2016-07-08 DIAGNOSIS — Z8249 Family history of ischemic heart disease and other diseases of the circulatory system: Secondary | ICD-10-CM | POA: Insufficient documentation

## 2016-07-08 DIAGNOSIS — G4733 Obstructive sleep apnea (adult) (pediatric): Secondary | ICD-10-CM | POA: Diagnosis not present

## 2016-07-08 DIAGNOSIS — F17200 Nicotine dependence, unspecified, uncomplicated: Secondary | ICD-10-CM | POA: Diagnosis not present

## 2016-07-08 DIAGNOSIS — Z8379 Family history of other diseases of the digestive system: Secondary | ICD-10-CM | POA: Diagnosis not present

## 2016-07-08 DIAGNOSIS — Z96643 Presence of artificial hip joint, bilateral: Secondary | ICD-10-CM | POA: Diagnosis not present

## 2016-07-08 DIAGNOSIS — Z833 Family history of diabetes mellitus: Secondary | ICD-10-CM | POA: Insufficient documentation

## 2016-07-08 DIAGNOSIS — Z96653 Presence of artificial knee joint, bilateral: Secondary | ICD-10-CM | POA: Insufficient documentation

## 2016-07-08 DIAGNOSIS — I25110 Atherosclerotic heart disease of native coronary artery with unstable angina pectoris: Secondary | ICD-10-CM | POA: Diagnosis not present

## 2016-07-08 DIAGNOSIS — Z79899 Other long term (current) drug therapy: Secondary | ICD-10-CM | POA: Diagnosis not present

## 2016-07-08 DIAGNOSIS — I700 Atherosclerosis of aorta: Secondary | ICD-10-CM | POA: Diagnosis present

## 2016-07-08 DIAGNOSIS — R69 Illness, unspecified: Secondary | ICD-10-CM | POA: Diagnosis not present

## 2016-07-08 DIAGNOSIS — Z7982 Long term (current) use of aspirin: Secondary | ICD-10-CM | POA: Insufficient documentation

## 2016-07-08 DIAGNOSIS — I200 Unstable angina: Secondary | ICD-10-CM | POA: Diagnosis present

## 2016-07-08 HISTORY — PX: RIGHT/LEFT HEART CATH AND CORONARY ANGIOGRAPHY: CATH118266

## 2016-07-08 SURGERY — RIGHT AND LEFT HEART CATH
Anesthesia: Moderate Sedation | Laterality: Bilateral

## 2016-07-08 SURGERY — RIGHT/LEFT HEART CATH AND CORONARY ANGIOGRAPHY
Anesthesia: Moderate Sedation | Laterality: Bilateral

## 2016-07-08 MED ORDER — MIDAZOLAM HCL 2 MG/2ML IJ SOLN
INTRAMUSCULAR | Status: DC | PRN
  Administered 2016-07-08 (×2): 1 mg via INTRAVENOUS

## 2016-07-08 MED ORDER — FENTANYL CITRATE (PF) 100 MCG/2ML IJ SOLN
INTRAMUSCULAR | Status: AC
  Filled 2016-07-08: qty 2

## 2016-07-08 MED ORDER — ASPIRIN 81 MG PO CHEW: 81.0000 mg | CHEWABLE_TABLET | ORAL | Status: DC

## 2016-07-08 MED ORDER — MIDAZOLAM HCL 2 MG/2ML IJ SOLN
INTRAMUSCULAR | Status: AC
  Filled 2016-07-08: qty 2

## 2016-07-08 MED ORDER — FENTANYL CITRATE (PF) 100 MCG/2ML IJ SOLN
INTRAMUSCULAR | Status: DC | PRN
Start: 2016-07-08 — End: 2016-07-08
  Administered 2016-07-08 (×2): 25 ug via INTRAVENOUS

## 2016-07-08 MED ORDER — IPRATROPIUM-ALBUTEROL 0.5-2.5 (3) MG/3ML IN SOLN
3.0000 mL | Freq: Four times a day (QID) | RESPIRATORY_TRACT | Status: DC
  Administered 2016-07-08: 3 mL via RESPIRATORY_TRACT

## 2016-07-08 MED ORDER — HEPARIN (PORCINE) IN NACL 2-0.9 UNIT/ML-% IJ SOLN
INTRAMUSCULAR | Status: AC
  Filled 2016-07-08: qty 1000

## 2016-07-08 MED ORDER — SODIUM CHLORIDE 0.9 % IV SOLN
INTRAVENOUS | Status: DC
  Administered 2016-07-08: 1000 mL via INTRAVENOUS

## 2016-07-08 MED ORDER — ROSUVASTATIN CALCIUM 5 MG PO TABS: 5.0000 mg | ORAL_TABLET | Freq: Every day | ORAL | 3 refills | Status: DC

## 2016-07-08 MED ORDER — IPRATROPIUM-ALBUTEROL 0.5-2.5 (3) MG/3ML IN SOLN
RESPIRATORY_TRACT | Status: AC
  Administered 2016-07-08: 3 mL via RESPIRATORY_TRACT
  Filled 2016-07-08: qty 3

## 2016-07-08 MED ORDER — IOPAMIDOL (ISOVUE-300) INJECTION 61%
INTRAVENOUS | Status: DC | PRN
  Administered 2016-07-08: 110 mL via INTRA_ARTERIAL

## 2016-07-08 MED ORDER — IPRATROPIUM-ALBUTEROL 0.5-2.5 (3) MG/3ML IN SOLN: 3.0000 mL | Freq: Once | RESPIRATORY_TRACT | Status: DC

## 2016-07-08 SURGICAL SUPPLY — 15 items
CATH 5FR PIGTAIL DIAGNOSTIC (CATHETERS) ×2 IMPLANT
CATH INFINITI 5FR JL4 (CATHETERS) ×2 IMPLANT
CATH INFINITI JR4 5F (CATHETERS) ×2 IMPLANT
CATH SWANZ 7F THERMO (CATHETERS) ×2 IMPLANT
DEVICE CLOSURE MYNXGRIP 5F (Vascular Products) ×2 IMPLANT
DEVICE SAFEGUARD 24CM (GAUZE/BANDAGES/DRESSINGS) ×2 IMPLANT
GUIDEWIRE EMER 3M J .025X150CM (WIRE) ×2 IMPLANT
KIT MANI 3VAL PERCEP (MISCELLANEOUS) ×2 IMPLANT
KIT RIGHT HEART (MISCELLANEOUS) ×2 IMPLANT
NEEDLE PERC 18GX7CM (NEEDLE) ×2 IMPLANT
NEEDLE SMART REG 18GX2-3/4 (NEEDLE) ×2 IMPLANT
PACK CARDIAC CATH (CUSTOM PROCEDURE TRAY) ×2 IMPLANT
SHEATH AVANTI 5FR X 11CM (SHEATH) ×2 IMPLANT
SHEATH PINNACLE 7F 10CM (SHEATH) ×2 IMPLANT
WIRE EMERALD 3MM-J .035X150CM (WIRE) ×2 IMPLANT

## 2016-07-08 NOTE — Progress Notes (Signed)
Pt clinically stable post heart cath per Dr Pearline Cables to speak with patient and daughters. Questions answered. Has return appointment pre scheduled already. Right groin without bleeding nor hematoma,taking po's without difficulty.discharge teaching done with questions answered.sinus rhythm per monitor,

## 2016-07-08 NOTE — Discharge Instructions (Signed)

## 2016-07-11 ENCOUNTER — Encounter: Payer: Self-pay | Admitting: Cardiovascular Disease

## 2016-07-11 ENCOUNTER — Other Ambulatory Visit: Payer: Self-pay | Admitting: Family Medicine

## 2016-07-11 ENCOUNTER — Other Ambulatory Visit: Payer: Self-pay

## 2016-07-11 DIAGNOSIS — I5043 Acute on chronic combined systolic (congestive) and diastolic (congestive) heart failure: Secondary | ICD-10-CM

## 2016-07-11 DIAGNOSIS — R0602 Shortness of breath: Secondary | ICD-10-CM

## 2016-07-11 DIAGNOSIS — J849 Interstitial pulmonary disease, unspecified: Secondary | ICD-10-CM

## 2016-07-13 ENCOUNTER — Telehealth: Payer: Self-pay | Admitting: Pulmonary Disease

## 2016-07-13 NOTE — Telephone Encounter (Signed)
Spoke with pt. States that she wants her current inhalers changed. Feels like her inhalers are working. Advised her this is not a matter that can be handled over the phone, she would need an OV. Pt has been scheduled with [facility] on 07/19/16 at 9am. Several other appointments were offered before this but she declined due to her work schedule. Nothing further was needed.

## 2016-07-19 ENCOUNTER — Ambulatory Visit: Payer: Medicare HMO | Admitting: Adult Health

## 2016-07-20 ENCOUNTER — Other Ambulatory Visit: Payer: Self-pay | Admitting: Family Medicine

## 2016-07-20 NOTE — Telephone Encounter (Signed)
Last office visit 04/26/2016.  Last refilled 06/17/2016 for #20 with 1 refill.  Ok to refill?

## 2016-07-21 ENCOUNTER — Ambulatory Visit: Payer: Medicare HMO | Admitting: Cardiovascular Disease

## 2016-07-22 ENCOUNTER — Other Ambulatory Visit: Payer: Self-pay | Admitting: Family Medicine

## 2016-07-22 NOTE — Telephone Encounter (Signed)
Pt wanted to verify albuterol inhaler was refilled; advised pt already refilled to [facility] [place]. Pt voiced understanding.

## 2016-08-01 ENCOUNTER — Other Ambulatory Visit: Payer: Self-pay | Admitting: Family Medicine

## 2016-08-02 ENCOUNTER — Ambulatory Visit (INDEPENDENT_AMBULATORY_CARE_PROVIDER_SITE_OTHER)
Admission: RE | Admit: 2016-08-02 | Discharge: 2016-08-02 | Disposition: A | Discharge status: Home / Self Care | Payer: Medicare HMO | Source: Ambulatory Visit | Attending: Internal Medicine | Admitting: Internal Medicine

## 2016-08-02 DIAGNOSIS — J849 Interstitial pulmonary disease, unspecified: Secondary | ICD-10-CM | POA: Diagnosis not present

## 2016-08-03 ENCOUNTER — Ambulatory Visit (INDEPENDENT_AMBULATORY_CARE_PROVIDER_SITE_OTHER): Payer: Medicare HMO | Admitting: Pulmonary Disease

## 2016-08-03 ENCOUNTER — Other Ambulatory Visit (HOSPITAL_COMMUNITY): Payer: Self-pay | Admitting: Pulmonary Disease

## 2016-08-03 ENCOUNTER — Encounter: Payer: Self-pay | Admitting: Pulmonary Disease

## 2016-08-03 ENCOUNTER — Other Ambulatory Visit: Payer: Self-pay | Admitting: Pulmonary Disease

## 2016-08-03 VITALS — BP 134/78 | HR 73 | Ht 60.0 in | Wt 202.0 lb

## 2016-08-03 DIAGNOSIS — R1310 Dysphagia, unspecified: Secondary | ICD-10-CM

## 2016-08-03 DIAGNOSIS — J432 Centrilobular emphysema: Secondary | ICD-10-CM

## 2016-08-03 DIAGNOSIS — I5189 Other ill-defined heart diseases: Secondary | ICD-10-CM

## 2016-08-03 DIAGNOSIS — R0602 Shortness of breath: Secondary | ICD-10-CM | POA: Diagnosis not present

## 2016-08-03 DIAGNOSIS — I519 Heart disease, unspecified: Secondary | ICD-10-CM | POA: Diagnosis not present

## 2016-08-03 DIAGNOSIS — J849 Interstitial pulmonary disease, unspecified: Secondary | ICD-10-CM | POA: Diagnosis not present

## 2016-08-03 DIAGNOSIS — G4733 Obstructive sleep apnea (adult) (pediatric): Secondary | ICD-10-CM

## 2016-08-03 LAB — PULMONARY FUNCTION TEST
DL/VA % pred: 76 %
DL/VA: 3.24 ml/min/mmHg/L
DLCO COR % PRED: 62 %
DLCO COR: 11.89 ml/min/mmHg
DLCO UNC % PRED: 64 %
DLCO UNC: 12.13 ml/min/mmHg
FEF 25-75 POST: 1.29 L/s
FEF 25-75 PRE: 2.07 L/s
FEF2575-%Change-Post: -37 %
FEF2575-%PRED-POST: 89 %
FEF2575-%PRED-PRE: 142 %
FEV1-%Change-Post: -8 %
FEV1-%PRED-POST: 79 %
FEV1-%Pred-Pre: 87 %
FEV1-POST: 1.4 L
FEV1-Pre: 1.53 L
FEV1FVC-%Change-Post: 0 %
FEV1FVC-%PRED-PRE: 116 %
FEV6-%CHANGE-POST: -9 %
FEV6-%PRED-POST: 71 %
FEV6-%Pred-Pre: 79 %
FEV6-POST: 1.59 L
FEV6-Pre: 1.75 L
FEV6FVC-%PRED-POST: 106 %
FEV6FVC-%Pred-Pre: 106 %
FVC-%Change-Post: -9 %
FVC-%PRED-PRE: 74 %
FVC-%Pred-Post: 67 %
FVC-PRE: 1.75 L
FVC-Post: 1.59 L
PRE FEV1/FVC RATIO: 87 %
Post FEV1/FVC ratio: 88 %
Post FEV6/FVC ratio: 100 %
Pre FEV6/FVC Ratio: 100 %
RV % pred: 87 %
RV: 1.83 L
TLC % pred: 89 %
TLC: 4 L

## 2016-08-03 NOTE — Progress Notes (Signed)
Subjective:    Patient ID: Susan [name], female    DOB: Sep 29, 1940, 76 y.o.   MRN: [id number]  Synopsis: [name] first saw the [facility] pulmonary clinic in September 2014 for shortness of breath. She had a previous heavy smoking history but he quit by the time she saw me. Lung function testing was actually not consistent with obstruction but she did have emphysema on her chest CT. Spiriva was started empirically for that. She also had a home sleep study which showed an apnea hypopnea index of 44. A follow-up split-night study showed an AHI of only 18, CPAP was titrated to 10 cm of water but she was only able to wear it for approximately 15 minutes. She has had difficulty using CPAP when prescribed in the past.   HPI  Chief Complaint  Patient presents with  . Follow-up    PFT today, SOB is not doing better    She is still struggling to breathe, it is definitely worse compared to the last visit.    She is not coughing any more.  She feels that she gives out of breath very quickly now, just walking 10-15 feet makes her short of breath.    She never go any new mask or supplies for her cpap.  She is wheezing a little more.  No heartburn, food doesn't get stuck.     Past Medical History:  Diagnosis Date  . Acute peptic ulcer, unspecified site, with hemorrhage and perforation, without mention of obstruction   . Arthritis   . Asthma   . Balance problems    unsteady on feet at time  . Chronic airway obstruction, not elsewhere classified    on inhalers  . Diverticulosis of colon (without mention of hemorrhage)   . Hematochezia   . 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 Ventral hernia      Review of Systems  Constitutional: Negative for chills, fatigue and fever.  HENT: Negative for congestion, nosebleeds, postnasal drip and rhinorrhea.   Respiratory: Negative for cough, shortness of breath and wheezing.   Cardiovascular: Negative for chest pain,  palpitations and leg swelling.       Objective:   Physical Exam  Vitals:   08/03/16 1103  BP: 134/78  BP Location: Left Arm  Cuff Size: Normal  Pulse: 73  SpO2: 96%  Weight: 202 lb (91.6 kg)  Height: 5' (1.524 m)  RA   Gen: well appearing HENT: OP clear, TM's clear, neck supple PULM: Crackles bases B, normal percussion CV: RRR, no mgr, trace edema GI: BS+, soft, nontender Derm: no cyanosis or rash Psyche: normal mood and affect    Imaging: May 2014 chest x-ray no acute cardiopulmonary abnormality, questionable hyperinflation 01/2013 CT chest 31mm sub pleural nodule, mild emphysema 01/2013 CT chest 53mm sub pleural nodule, mild emphysema 04/22/2013 CT chest > 68mm nodule unchanged 08/2014 CT chest > nodule unchanged, progressive interstitial infilatrate bilateral lower lobes of uncertain etiology April 2017 high-resolution CT chest: Findings worrisome for slightly progressive but nonspecific interstitial lung disease. 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 (Personally reviewed again 03/18/2016). 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 May be due to interstitial lung disease such as nonspecific interstitial pneumonitis.  PFT: June 2014 full pulmonary function [facility] ratio 80%, FEV1 1.74 L (101% predicted) total lung capacity is 3.81 L (91% predicted), DLCO 11.0 (56% predicted) 09/2015 PFT repeat > 86% FVC 2.03L (85% pred), FEV1 1.72L (96% pred), TLC 3.98L (89% pred), DLCO 12.12 (64% pred) November 2017 pulmonary function testing ratio normal, FEV1 1.73 L, 98% predicted, FVC 1.98 L 83% predicted, total lung capacity 3.90 L 87% predicted, DLCO 12.29 65% predicted March 2018  pulmonary function testing ratio normal, FEV1 1.53 L 87% predicted, FVC 1.75 L 74% predicted, total lung capacity 4.0 L 89% predicted, residual volume normal, ERV 0.13 L 29% predicted, DLCO 12.13 64% predicted  Heart catheterization: February 2018 by Dr. Rockey Situ. Proximal left anterior descending 50% obstruction, mid RCA 30%, LVEF 55-65%, LVEDP moderately elevated at 28 mmHg, right heart catheterization showed a mean PA pressure of 28, pulmonary capillary wedge pressure 19.  Labs: 12/2015 ANA, CCP, RF, SCL-70, Jo-1, SSA, SSB, Aldolase all negative     Assessment & Plan:   Diastolic dysfunction On her recent left heart catheterization she did not have evidence of obstructive coronary disease but her left ventricular end-diastolic pressure and wedge pressure were both mildly elevated. PA pressure was slightly elevated as well which is due to left heart disease.  I explained to her that the majority of her dyspnea comes from her lung disease and deconditioning but to some degree diastolic heart failure contributes as well.  Obstructive sleep apnea Send prescription again for supplies. Instructed to call if she has not received the supplies.  Centrilobular emphysema (HCC) Emphysema contributes to her disease but it is not as severe as the interstitial lung disease. Continue Symbicort.  ILD (interstitial lung disease) ([place]) Her interstitial lung disease is worsening. Specifically, her lung function testing shows worsening FVC yet her DLCO again paradoxically remains the same. Despite this, her high-resolution CT scan of the chest clearly shows progression of fibrotic findings in the lower lobes bilaterally.  In addition, her lung function testing is suggestive of neuromuscular weakness or body habitus contributing to her strict of lung disease.  This is a difficult situation at best. 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 We would either treat with anti-fibrotic therapy or aggressive immunosuppression if we have the benefit of a biopsy. However, she would still have deconditioning and diastolic heart failure to deal with this well.  While I doubt that recurrent aspiration is contributing, that could explain the findings seen on CT.  Plan: Check tests of respiratory muscle strength Barium swallow to assess for aspiration, esophageal disease We talked today at length about the possibility of an open lung biopsy. She is still hesitant to consider this at this time. On the next visit if we have no evidence of aspiration or neuromuscular weakness we are going to decide whether or not she was to consider an open lung biopsy. If she is resistant to this idea then we can try more aggressive immunosuppression.  Today we had a quite frank conversation and I informed her that I worry that her prognosis is not good given the progression of her symptoms and objective abnormalities. She is cognizant of this and says that she just "wants to stay happy" and is not interested in an aggressive invasive approach.  > 50% of this 45 minute visit spent face to face   Updated Medication List Outpatient  Encounter Prescriptions as of 08/03/2016  Medication Sig  . fluticasone (FLONASE) 50 MCG/ACT nasal spray Place 2 sprays into both nostrils daily.  Marland Kitchen ibuprofen (ADVIL,MOTRIN) 200 MG tablet Take 600 mg by mouth every 6 (six) hours as needed for headache or moderate pain.   . Melatonin 10 MG TABS Take 15 mg by mouth at bedtime.  . Naproxen Sod-Diphenhydramine (ALEVE PM) 220-25 MG TABS Take 1 tablet by mouth at bedtime.  Marland Kitchen rOPINIRole (REQUIP) 3 MG tablet TAKE ONE TABLET BY MOUTH EVERY EVENING AT BEDTIME  . rosuvastatin (CRESTOR) 5 MG tablet Take 1 tablet (5 mg total) by mouth daily.  . SYMBICORT 160-4.5 MCG/ACT inhaler INHALE 2 PUFFS INTO THE LUNGS 2 (TWO) TIMES  DAILY.  . traZODone (DESYREL) 50 MG tablet TAKE ONE AND ONE-HALF TABLETS AT BEDTIME. AS NEEDED FOR SLEEP  . valACYclovir (VALTREX) 1000 MG tablet TAKE ONE TABLET BY MOUTH EVERY DAY  . VENTOLIN HFA 108 (90 Base) MCG/ACT inhaler INHALE 2 PUFFS 1 OR 2 MINUTES APART EVERY 6 HOURS AS NEEDED FOR WHEEZING, TIGHT COUTH OR FOR SHORTNESS OF BREATH   Facility-Administered Encounter Medications as of 08/03/2016  Medication  . dexamethasone (DECADRON) injection 4 mg  . ondansetron (ZOFRAN) 4 mg in sodium chloride 0.9 % 50 mL IVPB  . ondansetron (ZOFRAN) 4 mg in sodium chloride 0.9 % 50 mL IVPB

## 2016-08-03 NOTE — Progress Notes (Signed)
PFT done today. 

## 2016-08-03 NOTE — Assessment & Plan Note (Signed)
Emphysema contributes to her disease but it is not as severe as the interstitial lung disease. Continue Symbicort.

## 2016-08-03 NOTE — Assessment & Plan Note (Signed)
Her interstitial lung disease is worsening. Specifically, her lung function testing shows worsening FVC yet her DLCO again paradoxically remains the same. Despite this, her high-resolution CT scan of the chest clearly shows progression of fibrotic findings in the lower lobes bilaterally.  In addition, her lung function testing is suggestive of neuromuscular weakness or body habitus contributing to her strict of lung disease.  This is a difficult situation at best. 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 We would either treat with anti-fibrotic therapy or aggressive immunosuppression if we have the benefit of a biopsy. However, she would still have deconditioning and diastolic heart failure to deal with this well.  While I doubt that recurrent aspiration is contributing, that could explain the findings seen on CT.  Plan: Check tests of respiratory muscle strength Barium swallow to assess for aspiration, esophageal disease We talked today at length about the possibility of an open lung biopsy. She is still hesitant to consider this at this time. On the next visit if we have no evidence of aspiration or neuromuscular weakness we are going to decide whether or not she was to consider an open lung biopsy. If she is resistant to this idea then we can try more aggressive immunosuppression.  Today we had a quite frank conversation and I informed her that I worry that her prognosis is not good given the progression of her symptoms and objective abnormalities. She is cognizant of this and says that she just "wants to stay happy" and is not interested in an aggressive invasive approach.  > 50% of this 45 minute visit spent face to face

## 2016-08-03 NOTE — Assessment & Plan Note (Signed)
On her recent left heart catheterization she did not have evidence of obstructive coronary disease but her left ventricular end-diastolic pressure and wedge pressure were both mildly elevated. PA pressure was slightly elevated as well which is due to left heart disease.  I explained to her that the majority of her dyspnea comes from her lung disease and deconditioning but to some degree diastolic heart failure contributes as well.

## 2016-08-03 NOTE — Patient Instructions (Signed)
We will arrange for a test of your respiratory muscle strength and assess to see if food is going down the wrong pipe We will see you back in 2-3 weeks to discuss a biopsy, overbook OK

## 2016-08-03 NOTE — Assessment & Plan Note (Signed)
Send prescription again for supplies. Instructed to call if she has not received the supplies.

## 2016-08-09 ENCOUNTER — Telehealth: Payer: Self-pay | Admitting: Pulmonary Disease

## 2016-08-09 ENCOUNTER — Ambulatory Visit: Payer: Medicare HMO | Attending: Pulmonary Disease

## 2016-08-09 DIAGNOSIS — R0602 Shortness of breath: Secondary | ICD-10-CM | POA: Diagnosis not present

## 2016-08-09 NOTE — Telephone Encounter (Signed)
Per pft order on 3/14 pt is to have a mip/mep only.   This has been relayed to Jenny Reichmann at [facility]. Nothing further needed.

## 2016-08-10 ENCOUNTER — Ambulatory Visit (HOSPITAL_COMMUNITY)
Admission: RE | Admit: 2016-08-10 | Discharge: 2016-08-10 | Disposition: A | Discharge status: Home / Self Care | Payer: Medicare HMO | Source: Ambulatory Visit | Attending: Pulmonary Disease | Admitting: Pulmonary Disease

## 2016-08-10 DIAGNOSIS — Z96651 Presence of right artificial knee joint: Secondary | ICD-10-CM | POA: Insufficient documentation

## 2016-08-10 DIAGNOSIS — K439 Ventral hernia without obstruction or gangrene: Secondary | ICD-10-CM | POA: Diagnosis not present

## 2016-08-10 DIAGNOSIS — Z96643 Presence of artificial hip joint, bilateral: Secondary | ICD-10-CM | POA: Insufficient documentation

## 2016-08-10 DIAGNOSIS — K270 Acute peptic ulcer, site unspecified, with hemorrhage: Secondary | ICD-10-CM | POA: Insufficient documentation

## 2016-08-10 DIAGNOSIS — K921 Melena: Secondary | ICD-10-CM | POA: Insufficient documentation

## 2016-08-10 DIAGNOSIS — G4733 Obstructive sleep apnea (adult) (pediatric): Secondary | ICD-10-CM | POA: Diagnosis not present

## 2016-08-10 DIAGNOSIS — K5730 Diverticulosis of large intestine without perforation or abscess without bleeding: Secondary | ICD-10-CM | POA: Diagnosis not present

## 2016-08-10 DIAGNOSIS — B009 Herpesviral infection, unspecified: Secondary | ICD-10-CM | POA: Insufficient documentation

## 2016-08-10 DIAGNOSIS — R1310 Dysphagia, unspecified: Secondary | ICD-10-CM | POA: Insufficient documentation

## 2016-08-10 DIAGNOSIS — J449 Chronic obstructive pulmonary disease, unspecified: Secondary | ICD-10-CM | POA: Diagnosis not present

## 2016-08-10 DIAGNOSIS — R1313 Dysphagia, pharyngeal phase: Secondary | ICD-10-CM | POA: Insufficient documentation

## 2016-08-10 DIAGNOSIS — R05 Cough: Secondary | ICD-10-CM | POA: Diagnosis not present

## 2016-08-10 NOTE — Progress Notes (Signed)
Modified Barium Swallow Progress Note  Patient Details  Name: Susan [name] MRN: [id number] Date of Birth: 06-02-1940  Today's Date: 08/10/2016  Modified Barium Swallow completed.  Full report located under Chart Review in the Imaging Section.  Brief recommendations include the following:  Clinical Impression  Ms. Wisser demonstrates a moderate oropharygneal dysphagia due to reduced laryngeal closure during the swallow allowing for silent trace frank penetration and aspiration events at the inter arytenoid space. With larger straw sips, pattern of swallow is quite discoordinted with increased volume of aspiration occuring before/during the swallow with sensation and a cough response. SLP provided max verbal cues and visual biofeedback to train pt in a super-supraglottic swallow maneuver (sip, hold breath to adduct vocal folds, swallow, cough to eject any penetrate). Over multiple trials pt demonstrated successful airway protection with this method. Recommend pt continue a regular diet and thin liquids with f/u with an outpatient SLP to practice this behavior as it can be quite challenging.  Pt is already quite conscientious with oral care, but SLP encouraged her routine 3x a day instead of 2.  Pt may also benefit from Expiratory Muscle strength training for strengthing and improved airway clearance. MD could also consider referral to ENT for direct visualization of the larynx as there could be an anatomical change to the vocal folds that could be addressed to improve airway protection.    Swallow Evaluation Recommendations   Recommended Consults: Consider ENT evaluation   SLP Diet Recommendations: Regular solids;Thin liquid   Liquid Administration via: Cup;No straw   Medication Administration: Whole meds with liquid   Supervision: Patient able to self feed   Compensations: Slow rate;Small sips/bites;Hard cough after swallow   Postural Changes: Remain semi-upright after after feeds/meals  (Comment)   Oral Care Recommendations: Other (Comment) (Oral care 3x a day, discussed with pt)       Herbie Baltimore, MA CCC-SLP [phone]  Tamzin Bertling, Katherene Ponto 08/10/2016,2:22 PM

## 2016-08-12 ENCOUNTER — Encounter: Payer: Self-pay | Admitting: Pulmonary Disease

## 2016-08-12 NOTE — Progress Notes (Signed)
   08/10/16 1200  SLP G-Codes **NOT FOR INPATIENT CLASS**  Functional Assessment Tool Used (clinical judgement)  Functional Limitations Swallowing  Swallow Current Status (D7824) CJ  Swallow Goal Status (M3536) CJ  Swallow Discharge Status (R4431) CJ  SLP Evaluations  $ SLP Speech Visit 1 Procedure  SLP Evaluations  $Swallowing Treatment 1 Procedure  $MBS Swallow Outpatient 1 Procedure

## 2016-08-12 NOTE — Progress Notes (Signed)
LMTCB

## 2016-08-12 NOTE — Progress Notes (Signed)
PFT done.  

## 2016-08-14 NOTE — Progress Notes (Signed)
Cardiology Office Note  Date:  08/16/2016   ID:  Susan [name], DOB 12/02/1940, MRN [id number]  PCP:  Susan Lofts, MD   Chief Complaint  Patient presents with  . other    Post cardiac cath c/o sob. Meds reviewed verbally with pt.    HPI:  Susan [name] is a very pleasant 76 year-old woman with a long history of smoking, 20 ppd smoker quit 15 years ago,  CAD on CT scan, confirm on cath 07/2016, nonobstructive CAD Moderate LVH on echo, normal EF 55% OSA who does not wear her CPAP,  COPD on triple inhaler regiment,  status post bilateral hip replacement,  walk tests in office does not show desaturation,    morbid obesity who presents for follow up of her  Chronic severe shortness of breath, chest tightness with exertion  Recent cardiac catheterization for worsening shortness of breath, chest tightness concerning for unstable angina cath 07/08/2016  Prox LAD lesion, 50 %stenosed.  Mid RCA lesion, 30 %stenosed.  The left ventricular ejection fraction is 55-65% by visual estimate.  The left ventricular systolic function is normal.  LV end diastolic pressure is moderately elevated. Mildly elevated wedge pressure on right heart cath  Still with significant SOB  numerous CT scans showing mild emphysema There was some concern of by basilar ILD Seen by Susan [name]  Exposure hx - hair spray exposure x many decades   Other hospital records reviewed Admission to the hospital 03/2016 with acute respiratory distress COPD exacerbation PNA Sepsis due to Escherichia coli and Klebsiella pneumonia,  Acute pyelonephritis,  No regular exercise, continues to battle with her weight No significant lower extremity edema  EKG on today's visit shows normal sinus rhythm with rate 74 bpm no significant ST-T wave changes  Other past medical history reviewed CT scan show significant long region of heavy coronary calcification noted in the proximal to mid LAD and proximal RCA .no significant  calcifications noted in the left circumflex.    She's unable to treadmill secondary to arthritic pain  She reports that her husband died some time ago,  Previous Echo shows moderate concentric LVH, normal LV systolic function, diastolic dysfunction, borderline to mild pulmonary hypertension.   PMH:   has a past medical history of Acute peptic ulcer, unspecified site, with hemorrhage and perforation, without mention of obstruction; Arthritis; Asthma; Balance problems; Chronic airway obstruction, not elsewhere classified; Diverticulosis of colon (without mention of hemorrhage); Hematochezia; Herpes simplex without mention of complication; and Ventral hernia.  PSH:    Past Surgical History:  Procedure Laterality Date  . APPENDECTOMY    . BUNIONECTOMY Bilateral   . COSMETIC SURGERY  1970's   abdomen. Tummy Tuck  . FOOT SURGERY Bilateral dec 2015 right    left 3 yrs ago  . RIGHT/LEFT HEART CATH AND CORONARY ANGIOGRAPHY Bilateral 07/08/2016   Procedure: Right/Left Heart Cath and Coronary Angiography;  Surgeon: Susan Merritts, MD;  Location: [facility];  Service: Cardiovascular;  Laterality: Bilateral;  . TOTAL HIP ARTHROPLASTY  2007   left  . TOTAL HIP ARTHROPLASTY     bilateral   . TOTAL KNEE ARTHROPLASTY Right 08/08/2014   Procedure: RIGHT TOTAL KNEE ARTHROPLASTY WITH NAVIGATION;  Surgeon: Susan Can, MD;  Location: [facility];  Service: Orthopedics;  Laterality: Right;  . TOTAL KNEE ARTHROPLASTY Left 10/03/2014   Procedure: LEFT TOTAL KNEE ARTHROPLASTY;  Surgeon: Susan Can, MD;  Location: [facility];  Service: Orthopedics;  Laterality: Left;  . TUBAL LIGATION    .  UMBILICAL HERNIA REPAIR     2016    Current Outpatient Prescriptions  Medication Sig Dispense Refill  . fluticasone (FLONASE) 50 MCG/ACT nasal spray Place 2 sprays into both nostrils daily. 16 g 6  . ibuprofen (ADVIL,MOTRIN) 200 MG tablet Take 600 mg by mouth every 6 (six) hours as needed for headache or  moderate pain.     . Melatonin 10 MG TABS Take 15 mg by mouth at bedtime.    . Naproxen Sod-Diphenhydramine (ALEVE PM) 220-25 MG TABS Take 1 tablet by mouth at bedtime.    Marland Kitchen rOPINIRole (REQUIP) 3 MG tablet TAKE ONE TABLET BY MOUTH EVERY EVENING AT BEDTIME 30 tablet 5  . rosuvastatin (CRESTOR) 5 MG tablet Take 1 tablet (5 mg total) by mouth daily. 90 tablet 3  . SYMBICORT 160-4.5 MCG/ACT inhaler INHALE 2 PUFFS INTO THE LUNGS 2 (TWO) TIMES DAILY. 10.2 g 5  . traZODone (DESYREL) 50 MG tablet TAKE ONE AND ONE-HALF TABLETS AT BEDTIME. AS NEEDED FOR SLEEP 45 tablet 3  . valACYclovir (VALTREX) 1000 MG tablet TAKE ONE TABLET BY MOUTH EVERY DAY 20 tablet 3  . VENTOLIN HFA 108 (90 Base) MCG/ACT inhaler INHALE 2 PUFFS 1 OR 2 MINUTES APART EVERY 6 HOURS AS NEEDED FOR WHEEZING, TIGHT COUTH OR FOR SHORTNESS OF BREATH 18 each 2  . furosemide (LASIX) 20 MG tablet Take 1 tablet (20 mg total) by mouth daily as needed. 30 tablet 3  . potassium chloride (K-DUR) 10 MEQ tablet Take 1 tablet (10 mEq total) by mouth daily as needed. 30 tablet 3   No current facility-administered medications for this visit.    Facility-Administered Medications Ordered in Other Visits  Medication Dose Route Frequency Provider Last Rate Last Dose  . dexamethasone (DECADRON) injection 4 mg  4 mg Intravenous Once Susan Can, MD      . ondansetron [facility]) 4 mg in sodium chloride 0.9 % 50 mL IVPB  4 mg Intravenous Once Susan Can, MD      . ondansetron (ZOFRAN) 4 mg in sodium chloride 0.9 % 50 mL IVPB  4 mg Intravenous Once Susan Can, MD         Allergies:   Patient has no known allergies.   Social History:  The patient  reports that she quit smoking about 15 years ago. Her smoking use included Cigarettes. She has a 20.00 pack-year smoking history. She has never used smokeless tobacco. She reports that she does not drink alcohol or use drugs.   Family History:   family history includes Coronary artery disease in her father  and mother; Diabetes in her father and mother; Heart disease in her father and mother; Kidney disease in her father; Lung disease in her mother.    Review of Systems: Review of Systems  Constitutional: Negative.   Respiratory: Positive for shortness of breath.   Cardiovascular: Negative.   Gastrointestinal: Negative.   Musculoskeletal: Negative.   Neurological: Negative.   Psychiatric/Behavioral: Negative.   All other systems reviewed and are negative.    PHYSICAL EXAM: VS:  BP 128/84 (BP Location: Right Arm, Patient Position: Sitting, Cuff Size: Large)   Pulse 74   Ht 5' (1.524 m)   Wt 209 lb 8 oz (95 kg)   BMI 40.92 kg/m  , BMI Body mass index is 40.92 kg/m. GEN: Well nourished, well developed, in no acute distress , Obese HEENT: normal  Neck: no JVD, carotid bruits, or masses Cardiac: RRR; no murmurs, rubs, or gallops,no edema  Respiratory:  clear to auscultation bilaterally, normal work of breathing GI: soft, nontender, nondistended, + BS MS: no deformity or atrophy  Skin: warm and dry, no rash Neuro:  Strength and sensation are intact Psych: euthymic mood, full affect    Recent Labs: 03/18/2016: NT-Pro BNP 298 04/17/2016: B Natriuretic Peptide 166.0 04/18/2016: ALT 30; Magnesium 1.9; TSH 0.813 04/21/2016: Hemoglobin 12.6 07/06/2016: BUN 18; Creatinine, Ser 0.66; Platelets 171; Potassium 4.7; Sodium 144    Lipid Panel Lab Results  Component Value Date   CHOL 126 04/18/2016   HDL 44 04/18/2016   LDLCALC 74 04/18/2016   TRIG 40 04/18/2016      Wt Readings from Last 3 Encounters:  08/16/16 209 lb 8 oz (95 kg)  08/03/16 202 lb (91.6 kg)  07/08/16 211 lb (95.7 kg)       ASSESSMENT AND PLAN:  HYPERCHOLESTEROLEMIA - Plan: EKG 12-Lead Cholesterol is at goal on the current lipid regimen. No changes to the medications were made.  Aortic atherosclerosis ([place]) - Plan: EKG 12-Lead Aggressive cholesterol management recommended  Coronary artery disease  involving native coronary artery of native heart with unstable angina pectoris ([place]) - Plan: EKG 12-Lead Recent cardiac catheterization with nonobstructive disease  Obstructive sleep apnea - Plan: EKG 12-Lead We have encouraged continued exercise, careful diet management in an effort to lose weight.  Centrilobular emphysema ([place]) - Plan: EKG 12-Lead  ILD (interstitial lung disease) ([place]) - Plan: EKG 12-Lead Previous spray and exposure, occupational Followed by pulmonary  Acute respiratory failure with hypoxia ([place]) - Plan: EKG 12-Lead Recommended she participate in long works at the hospital Order placed by pulmonary, they have already contacted them over the phone but are delaying as she needs swallow evaluation. Recommended they could do both at the same time --We have recommended she take Lasix 2-3 days per week for mildly elevated wedge pressure on right heart catheterization. Results discussed with her    Total encounter time more than 25 minutes  Greater than 50% was spent in counseling and coordination of care with the patient   Disposition:   F/U  6 months   Orders Placed This Encounter  Procedures  . EKG 12-Lead     Signed, Esmond Plants, M.D., Ph.D. 08/16/2016  [place], [place]

## 2016-08-15 ENCOUNTER — Other Ambulatory Visit: Payer: Self-pay | Admitting: Family Medicine

## 2016-08-16 ENCOUNTER — Encounter: Payer: Self-pay | Admitting: Cardiovascular Disease

## 2016-08-16 ENCOUNTER — Ambulatory Visit (INDEPENDENT_AMBULATORY_CARE_PROVIDER_SITE_OTHER): Payer: Medicare HMO | Admitting: Cardiovascular Disease

## 2016-08-16 ENCOUNTER — Telehealth: Payer: Self-pay | Admitting: Pulmonary Disease

## 2016-08-16 VITALS — BP 128/84 | HR 74 | Ht 60.0 in | Wt 209.5 lb

## 2016-08-16 DIAGNOSIS — J432 Centrilobular emphysema: Secondary | ICD-10-CM | POA: Diagnosis not present

## 2016-08-16 DIAGNOSIS — E782 Mixed hyperlipidemia: Secondary | ICD-10-CM

## 2016-08-16 DIAGNOSIS — I25110 Atherosclerotic heart disease of native coronary artery with unstable angina pectoris: Secondary | ICD-10-CM

## 2016-08-16 DIAGNOSIS — J849 Interstitial pulmonary disease, unspecified: Secondary | ICD-10-CM | POA: Diagnosis not present

## 2016-08-16 DIAGNOSIS — E7800 Pure hypercholesterolemia, unspecified: Secondary | ICD-10-CM | POA: Diagnosis not present

## 2016-08-16 DIAGNOSIS — I700 Atherosclerosis of aorta: Secondary | ICD-10-CM | POA: Diagnosis not present

## 2016-08-16 DIAGNOSIS — G4733 Obstructive sleep apnea (adult) (pediatric): Secondary | ICD-10-CM | POA: Diagnosis not present

## 2016-08-16 DIAGNOSIS — J9601 Acute respiratory failure with hypoxia: Secondary | ICD-10-CM | POA: Diagnosis not present

## 2016-08-16 MED ORDER — FUROSEMIDE 20 MG PO TABS: 20.0000 mg | ORAL_TABLET | Freq: Every day | ORAL | 3 refills | Status: DC | PRN

## 2016-08-16 MED ORDER — POTASSIUM CHLORIDE ER 10 MEQ PO TBCR: 10.0000 meq | EXTENDED_RELEASE_TABLET | Freq: Every day | ORAL | 3 refills | Status: DC | PRN

## 2016-08-16 NOTE — Progress Notes (Signed)
Left message for patient to contact office for medical results.

## 2016-08-16 NOTE — Patient Instructions (Addendum)
Medication Instructions:   Please start lasix 20 mg  with potassium 10 meq 3 days a week for fluid build up/pressure  Labwork:  No new labs needed  Testing/Procedures:  No further testing at this time   I recommend watching educational videos on topics of interest to you at:       [email or web address]  Enter code: HEARTCARE    Follow-Up: It was a pleasure seeing you in the office today. Please call us if you have new issues that need to be addressed before your next appt.  [phone]  Your physician wants you to follow-up in: 6 months.  You will receive a reminder letter in the mail two months in advance. If you don't receive a letter, please call our office to schedule the follow-up appointment.  If you need a refill on your cardiac medications before your next appointment, please call your pharmacy.

## 2016-08-16 NOTE — Telephone Encounter (Signed)
Results of Swallowing Test have been explained to patient, pt expressed understanding. Nothing further needed.  Notes recorded by Juanito Doom, MD on 08/11/2016 at 12:07 AM EDT A, Please let the patient know this was OK Thanks, B

## 2016-08-17 ENCOUNTER — Encounter: Payer: Self-pay | Admitting: Pulmonary Disease

## 2016-08-17 ENCOUNTER — Ambulatory Visit (INDEPENDENT_AMBULATORY_CARE_PROVIDER_SITE_OTHER): Payer: Medicare HMO | Admitting: Pulmonary Disease

## 2016-08-17 VITALS — BP 124/66 | HR 85 | Ht 60.0 in | Wt 209.0 lb

## 2016-08-17 DIAGNOSIS — J432 Centrilobular emphysema: Secondary | ICD-10-CM

## 2016-08-17 DIAGNOSIS — I519 Heart disease, unspecified: Secondary | ICD-10-CM

## 2016-08-17 DIAGNOSIS — J849 Interstitial pulmonary disease, unspecified: Secondary | ICD-10-CM

## 2016-08-17 DIAGNOSIS — T17908A Unspecified foreign body in respiratory tract, part unspecified causing other injury, initial encounter: Secondary | ICD-10-CM

## 2016-08-17 DIAGNOSIS — R1310 Dysphagia, unspecified: Secondary | ICD-10-CM

## 2016-08-17 DIAGNOSIS — G4733 Obstructive sleep apnea (adult) (pediatric): Secondary | ICD-10-CM | POA: Diagnosis not present

## 2016-08-17 DIAGNOSIS — I5189 Other ill-defined heart diseases: Secondary | ICD-10-CM

## 2016-08-17 NOTE — Assessment & Plan Note (Signed)
Continue CPAP daily at bedtime 

## 2016-08-17 NOTE — Patient Instructions (Signed)
We will refer you to speech therapy Keep exercising with pulmonary rehabilitation, try to lose weight  The following 3 behaviors have been associated with weight loss on a consistent basis: #1 weigh yourself every day #2 write down every bit of food you put in your body #3 drink a glass of water prior to every meal  We will see you back in 3 months or sooner if needed

## 2016-08-17 NOTE — Progress Notes (Signed)
Subjective:    Patient ID: Susan [name], female    DOB: 05-Apr-1941, 76 y.o.   MRN: [id number]  Synopsis: Ms. Lartigue first saw the [facility] pulmonary clinic in September 2014 for shortness of breath. She had a previous heavy smoking history but he quit by the time she saw me. Lung function testing was actually not consistent with obstruction but she did have emphysema on her chest CT. Spiriva was started empirically for that. She also had a home sleep study which showed an apnea hypopnea index of 44. A follow-up split-night study showed an AHI of only 18, CPAP was titrated to 10 cm of water but she was only able to wear it for approximately 15 minutes. She has had difficulty using CPAP when prescribed in the past.  In 2017 she developed worsening fibrotic changes due to silent aspiration.     HPI  Chief Complaint  Patient presents with  . Follow-up    here to discuss possible bx.  pt's breathing unchanged since last OV.     She says that the swallow test was "interesting" and she said that she learned that she had aspiration.    Her breathing is about the same as prior.  She is not coughing much right now.  She doesn't recall choking with food.   Past Medical History:  Diagnosis Date  . Acute peptic ulcer, unspecified site, with hemorrhage and perforation, without mention of obstruction   . Arthritis   . Asthma   . Balance problems    unsteady on feet at time  . Chronic airway obstruction, not elsewhere classified    on inhalers  . Diverticulosis of colon (without mention of hemorrhage)   . Hematochezia   . 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 Ventral hernia      Review of Systems  Constitutional: Negative for chills, fatigue and fever.  HENT: Negative for congestion, nosebleeds, postnasal drip and rhinorrhea.   Respiratory: Negative for cough, shortness of breath and wheezing.   Cardiovascular: Negative for chest pain, palpitations and leg  swelling.       Objective:   Physical Exam  Vitals:   08/17/16 0859  BP: 124/66  Pulse: 85  SpO2: 94%  Weight: 209 lb (94.8 kg)  Height: 5' (1.524 m)  RA   Gen: well appearing HENT: OP clear, TM's clear, neck supple PULM: Crackles bases of lungs B, normal percussion CV: RRR, no mgr, trace edema GI: BS+, soft, nontender Derm: no cyanosis or rash Psyche: normal mood and affect     Imaging: May 2014 chest x-ray no acute cardiopulmonary abnormality, questionable hyperinflation 01/2013 CT chest 1mm sub pleural nodule, mild emphysema 01/2013 CT chest 48mm sub pleural nodule, mild emphysema 04/22/2013 CT chest > 71mm nodule unchanged 08/2014 CT chest > nodule unchanged, progressive interstitial infilatrate bilateral lower lobes of uncertain etiology April 2017 high-resolution CT chest: Findings worrisome for slightly progressive but nonspecific interstitial lung disease. 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 (Personally reviewed again 03/18/2016). 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 May be due to interstitial lung disease such as nonspecific interstitial pneumonitis. 08/10/2016 modified barium swallow showed dysphagia and moderate aspiration risk  PFT: June 2014 full pulmonary function [facility] at [facility] ratio 80%, FEV1 1.74 L (  101% predicted) total lung capacity is 3.81 L (91% predicted), DLCO 11.0 (56% predicted) 09/2015 PFT repeat > 86% FVC 2.03L (85% pred), FEV1 1.72L (96% pred), TLC 3.98L (89% pred), DLCO 12.12 (64% pred) November 2017 pulmonary function testing ratio normal, FEV1 1.73 L, 98% predicted, FVC 1.98 L 83% predicted, total lung capacity 3.90 L 87% predicted, DLCO 12.29 65%  predicted March 2018 pulmonary function testing ratio normal, FEV1 1.53 L 87% predicted, FVC 1.75 L 74% predicted, total lung capacity 4.0 L 89% predicted, residual volume normal, ERV 0.13 L 29% predicted, DLCO 12.13 64% predicted March 2018 MIP/MEP> expiratory max 28% predicted, and sponge Remax 68% predicted  Heart catheterization: February 2018 by Dr. Rockey Situ. Proximal left anterior descending 50% obstruction, mid RCA 30%, LVEF 55-65%, LVEDP moderately elevated at 28 mmHg, right heart catheterization showed a mean PA pressure of 28, pulmonary capillary wedge pressure 19.  Labs: 12/2015 ANA, CCP, RF, SCL-70, Jo-1, SSA, SSB, Aldolase all negative  Cardiology records reviewed, starting on diuresis     Assessment & Plan:   Aspiration into airway Her modified barium swallow test this month confirm silent aspiration.  She had no idea that this was happening.  This is the explanation for her worsening pulmonary fibrosis. We no longer need to obtain an open lung biopsy or consider immunosuppression.  Plan: Continue follow-up with speech therapy  ILD (interstitial lung disease) ([place]) Her speech therapy test showed silent aspiration. As detailed above, we no longer need to consider an open lung biopsy or immunosuppression.  Plan: Continue focus on weight loss with exercise Continue speech therapy Repeat pulmonary function testing in 6 months  Centrilobular emphysema ([facility]) Continue current inhaled regimen.  Obstructive sleep apnea Continue CPAP daily at bedtime  Diastolic dysfunction Continue diuretic regimen as recommended by cardiology per   Updated Medication List Outpatient Encounter Prescriptions as of 08/17/2016  Medication Sig  . fluticasone (FLONASE) 50 MCG/ACT nasal spray Place 2 sprays into both nostrils daily.  . furosemide (LASIX) 20 MG tablet Take 1 tablet (20 mg total) by mouth daily as needed.  Marland Kitchen ibuprofen (ADVIL,MOTRIN) 200 MG tablet Take 600 mg by mouth every 6  (six) hours as needed for headache or moderate pain.   . Melatonin 10 MG TABS Take 15 mg by mouth at bedtime.  . Naproxen Sod-Diphenhydramine (ALEVE PM) 220-25 MG TABS Take 1 tablet by mouth at bedtime.  . potassium chloride (K-DUR) 10 MEQ tablet Take 1 tablet (10 mEq total) by mouth daily as needed.  Marland Kitchen rOPINIRole (REQUIP) 3 MG tablet TAKE ONE TABLET BY MOUTH EVERY EVENING AT BEDTIME  . rosuvastatin (CRESTOR) 5 MG tablet Take 1 tablet (5 mg total) by mouth daily.  . SYMBICORT 160-4.5 MCG/ACT inhaler INHALE 2 PUFFS INTO THE LUNGS 2 (TWO) TIMES DAILY.  . traZODone (DESYREL) 50 MG tablet TAKE ONE AND ONE-HALF TABLETS AT BEDTIME. AS NEEDED FOR SLEEP  . valACYclovir (VALTREX) 1000 MG tablet TAKE ONE TABLET BY MOUTH EVERY DAY  . VENTOLIN HFA 108 (90 Base) MCG/ACT inhaler INHALE 2 PUFFS 1 OR 2 MINUTES APART EVERY 6 HOURS AS NEEDED FOR WHEEZING, TIGHT COUTH OR FOR SHORTNESS OF BREATH   Facility-Administered Encounter Medications as of 08/17/2016  Medication  . dexamethasone (DECADRON) injection 4 mg  . ondansetron (ZOFRAN) 4 mg in sodium chloride 0.9 % 50 mL IVPB  . ondansetron (ZOFRAN) 4 mg in sodium chloride 0.9 % 50 mL IVPB

## 2016-08-17 NOTE — Assessment & Plan Note (Signed)
Continue diuretic regimen as recommended by cardiology per

## 2016-08-17 NOTE — Assessment & Plan Note (Signed)
Her speech therapy test showed silent aspiration. As detailed above, we no longer need to consider an open lung biopsy or immunosuppression.  Plan: Continue focus on weight loss with exercise Continue speech therapy Repeat pulmonary function testing in 6 months

## 2016-08-17 NOTE — Assessment & Plan Note (Signed)
Her modified barium swallow test this month confirm silent aspiration.  She had no idea that this was happening.  This is the explanation for her worsening pulmonary fibrosis. We no longer need to obtain an open lung biopsy or consider immunosuppression.  Plan: Continue follow-up with speech therapy

## 2016-08-17 NOTE — Assessment & Plan Note (Signed)
Continue current inhaled regimen.

## 2016-08-18 ENCOUNTER — Telehealth: Payer: Self-pay | Admitting: Pulmonary Disease

## 2016-08-18 NOTE — Telephone Encounter (Signed)
Spoke with pharmacist at [facility] and informed them pt had an rx sent on 07/22/16 for her Ventolin by Amy Bedsole's office to [facility] with refills. They stated they will inform the pt of this. Nothing further is needed

## 2016-08-22 ENCOUNTER — Encounter: Payer: Medicare HMO | Attending: Cardiovascular Disease

## 2016-08-23 ENCOUNTER — Ambulatory Visit: Payer: Medicare HMO | Attending: Pulmonary Disease | Admitting: Speech Pathology

## 2016-08-23 DIAGNOSIS — R1313 Dysphagia, pharyngeal phase: Secondary | ICD-10-CM | POA: Insufficient documentation

## 2016-08-25 ENCOUNTER — Ambulatory Visit: Payer: Medicare HMO | Admitting: Speech Pathology

## 2016-08-29 ENCOUNTER — Ambulatory Visit (INDEPENDENT_AMBULATORY_CARE_PROVIDER_SITE_OTHER): Payer: Medicare HMO | Admitting: Family Medicine

## 2016-08-29 ENCOUNTER — Encounter: Payer: Self-pay | Admitting: Family Medicine

## 2016-08-29 ENCOUNTER — Other Ambulatory Visit: Payer: Self-pay | Admitting: *Deleted

## 2016-08-29 VITALS — BP 140/80 | HR 87 | Temp 98.7°F | Ht 60.0 in | Wt 209.2 lb

## 2016-08-29 DIAGNOSIS — M19011 Primary osteoarthritis, right shoulder: Secondary | ICD-10-CM | POA: Diagnosis not present

## 2016-08-29 DIAGNOSIS — M19012 Primary osteoarthritis, left shoulder: Secondary | ICD-10-CM

## 2016-08-29 DIAGNOSIS — M19019 Primary osteoarthritis, unspecified shoulder: Secondary | ICD-10-CM

## 2016-08-29 MED ORDER — METHYLPREDNISOLONE ACETATE 40 MG/ML IJ SUSP
80.0000 mg | Freq: Once | INTRAMUSCULAR | Status: AC
  Administered 2016-08-29: 80 mg via INTRA_ARTICULAR

## 2016-08-29 NOTE — Progress Notes (Signed)
   Dr. Frederico Hamman T. Uvaldo Rybacki, MD, [facility] [place], [address] Phone: [phone] Fax: [phone]  08/29/2016  Patient: Susan [name], MRN: [id number], DOB: Jun 22, 1940, 76 y.o.  Primary Physician:  Eliezer Lofts, MD   Chief Complaint  Patient presents with  . Shoulder Pain    Bilateral    B procedure only. 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  Intrarticular Shoulder Injection, R Verbal consent was obtained from the patient. Risks including infection explained and contrasted with benefits and alternatives. Patient prepped with Chloraprep and Ethyl Chloride used for anesthesia. An intraarticular shoulder injection was performed using the posterior approach. The patient tolerated the procedure well and had decreased pain post injection. No complications. Injection: 8 cc of Lidocaine 1% and 2 mL Depo-Medrol 40 mg. Needle: 22 gauge   Intrarticular Shoulder Injection, L Verbal consent was obtained from the patient. Risks including infection explained and contrasted with benefits and alternatives. Patient prepped with Chloraprep and Ethyl Chloride used for anesthesia. An intraarticular shoulder injection was performed using the posterior approach. The patient tolerated the procedure well and had decreased pain post injection. No complications. Injection: 8 cc of Lidocaine 1% and 2 mL Depo-Medrol 40 mg. Needle: 22 gauge   Signed,  Emmanuela Ghazi T. Marjo Grosvenor, MD

## 2016-08-29 NOTE — Telephone Encounter (Signed)
Pt is requesting refill of inhalers, but currently sees pulmonology. pls advise

## 2016-08-29 NOTE — Progress Notes (Signed)
Pre visit review using our clinic review tool, if applicable. No additional management support is needed unless otherwise documented below in the visit note. 

## 2016-08-30 NOTE — Telephone Encounter (Signed)
Forward request to pulmonologist

## 2016-09-05 ENCOUNTER — Ambulatory Visit: Payer: Medicare HMO | Admitting: Speech Pathology

## 2016-09-08 ENCOUNTER — Ambulatory Visit: Payer: Medicare HMO | Admitting: Speech Pathology

## 2016-09-13 ENCOUNTER — Telehealth: Payer: Self-pay | Admitting: Pulmonary Disease

## 2016-09-13 ENCOUNTER — Ambulatory Visit: Payer: Medicare HMO | Admitting: Speech Pathology

## 2016-09-13 MED ORDER — BUDESONIDE-FORMOTEROL FUMARATE 160-4.5 MCG/ACT IN AERO: 2.0000 | INHALATION_SPRAY | Freq: Two times a day (BID) | RESPIRATORY_TRACT | 0 refills | Status: DC

## 2016-09-13 MED ORDER — ALBUTEROL SULFATE HFA 108 (90 BASE) MCG/ACT IN AERS: 2.0000 | INHALATION_SPRAY | Freq: Four times a day (QID) | RESPIRATORY_TRACT | 0 refills | Status: DC | PRN

## 2016-09-13 NOTE — Telephone Encounter (Signed)
Spoke with pt, who is requesting refills on ventolin and symbicort. Ventolin was last prescribed 07/22/16 with 2 refills by Dr. Diona Browner. I have spoken with Altha Harm at [facility], who states pt last picked Rx for ventolin up on 08-01-16. Pt has been transferring Rx back and front from [facility] and [facility] and paying out of pocket some months for ventolin. I asked pt how often she is using albuterol, pt states she is having to use ventolin tid everyday due to sob. Pt has been scheduled for 09/28/17 with BQ to discuss breathing and ventolin use. Rx for 1 month supply has been sent to preferred pharmacy for ventolin and symbicort, until after visit with BQ.  Nothing further needed.

## 2016-09-15 ENCOUNTER — Ambulatory Visit: Payer: Medicare HMO | Admitting: Speech Pathology

## 2016-09-19 ENCOUNTER — Other Ambulatory Visit: Payer: Self-pay | Admitting: Pulmonary Disease

## 2016-09-19 ENCOUNTER — Encounter: Payer: Self-pay | Admitting: Speech Pathology

## 2016-09-19 ENCOUNTER — Ambulatory Visit: Payer: Medicare HMO | Admitting: Speech Pathology

## 2016-09-19 DIAGNOSIS — R1313 Dysphagia, pharyngeal phase: Secondary | ICD-10-CM | POA: Diagnosis not present

## 2016-09-19 NOTE — Therapy (Signed)
[place] MAIN [facility] SERVICES [address] [place], [place], [address] Phone: [phone]   Fax:  [phone]  Speech Language Pathology Evaluation  Patient Details  Name: Susan [name] MRN: [id number] Date of Birth: 08-10-1940 Referring Provider: Dr. Lake Bells  Encounter Date: 09/19/2016      End of Session - 09/19/16 1250    Visit Number 1   Number of Visits 8   Date for SLP Re-Evaluation 10/21/16   SLP Start Time 1105   SLP Stop Time  1155   SLP Time Calculation (min) 50 min   Activity Tolerance Patient tolerated treatment well      Past Medical History:  Diagnosis Date  . Acute peptic ulcer, unspecified site, with hemorrhage and perforation, without mention of obstruction   . Arthritis   . Asthma   . Balance problems    unsteady on feet at time  . Chronic airway obstruction, not elsewhere classified    on inhalers  . Diverticulosis of colon (without mention of hemorrhage)   . Hematochezia   . 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 Ventral hernia     Past Surgical History:  Procedure Laterality Date  . APPENDECTOMY    . BUNIONECTOMY Bilateral   . COSMETIC SURGERY  1970's   abdomen. Tummy Tuck  . FOOT SURGERY Bilateral dec 2015 right    left 3 yrs ago  . RIGHT/LEFT HEART CATH AND CORONARY ANGIOGRAPHY Bilateral 07/08/2016   Procedure: Right/Left Heart Cath and Coronary Angiography;  Surgeon: Minna Merritts, MD;  Location: [place] [facility];  Service: Cardiovascular;  Laterality: Bilateral;  . TOTAL HIP ARTHROPLASTY  2007   left  . TOTAL HIP ARTHROPLASTY     bilateral   . TOTAL KNEE ARTHROPLASTY Right 08/08/2014   Procedure: RIGHT TOTAL KNEE ARTHROPLASTY WITH NAVIGATION;  Surgeon: Rod Can, MD;  Location: [facility];  Service: Orthopedics;  Laterality: Right;  . TOTAL KNEE ARTHROPLASTY Left 10/03/2014   Procedure: LEFT TOTAL KNEE ARTHROPLASTY;  Surgeon: Rod Can, MD;  Location: [facility];  Service:  Orthopedics;  Laterality: Left;  . TUBAL LIGATION    . UMBILICAL HERNIA REPAIR     2016    There were no vitals filed for this visit.      Subjective Assessment - 09/19/16 1241    Subjective "I don't swallow right"   Patient is accompained by: Family member  daughter Susan [name]   Currently in Pain? No/denies            SLP Evaluation [facility] - 09/19/16 1241      SLP Visit Information   SLP Received On 09/19/16   Referring Provider Dr. Lake Bells   Onset Date 08/10/16   Medical Diagnosis oropharyngeal dysphagia     Subjective   Subjective Pt seen with daughter for follow up and education after MBS completed in March 2018.   Patient/Family Stated Goal none stated     General Information   HPI [name] is a 76 year old female arriving for follow up after outpatient MBS, referred by Dr. Lake Bells. Per his notes, pts primary complaint has been shortness of breath and has been found to have emphysema and interstitial lung disease with progression of fibrotic findings on CT in lower lobes bilaterally. Lung function testing is suggestive of neuromuscular weakness or restrictive lung disease due to body habitus. Dr Lake Bells sent her for [facility] to determine if recurrent aspiration could be a contributing factor. She denies any symptoms  of aspiration, but is noted to clear her throat frequently prior to PO being given. MBS results indicated moderate dysphagia, characterized by reduced laryngeal closure during the swallow, resulting in silent penetration and aspiration. Tongue base strength, hyolaryngeal elevation WFL. Outpatient follow up was recommended to review results and provide education on specific swallowing maneuvers and exercises.    Behavioral/Cognition WFL for this visit   Mobility Status pt used transport chair today     Assessment   SLP Visit Diagnosis Dysphagia, pharyngeal phase (R13.13)                         SLP Education - 10/07/16 1249    Education  provided Yes   Education Details safe swallow precautions, super-supraglottic swallow, vocal fold adduction exercises   Person(s) Educated Patient;Child(ren)   Methods Explanation;Demonstration;Verbal cues;Handout   Comprehension Verbalized understanding;Returned demonstration;Verbal cues required;Need further instruction            SLP Long Term Goals - 10-07-2016 1256      SLP LONG TERM GOAL #1   Title Pt will demonstrate dysphagia HEP independently   Baseline max cues required - teaching new technique   Time 4   Period Weeks   Status New     SLP LONG TERM GOAL #2   Title Pt will report at least daily completion of HEP/swallow maneuvers between therapies.   Baseline HEP just provided today   Time 4   Period Weeks   Status New          Plan - 2016-10-07 1251    Clinical Impression Statement Pt was seen following objective assessment 08/10/16 with MBS to review results and provide education. Pt was seen with her daughter, Susan [name], in attendance. SLP reviewed MBS report with pt/daughter, then provided written information regarding safe swallow precautions and therapeutic maneuvers/exercises (super-supraglottic swallow and vocal fold adduction exercises). Pt returned demonstration, and indicated willingness to adhere to strategies and swallow maneuver. Daughter expressed concern about pt difficulty retaining new information (pt could not recall information given by SLP immediately following MBS). For this reason, brief therapy is recommended for continued education and to insure proper completion of exercises, for improved swallow function and safety.    Speech Therapy Frequency 2x / week   Duration --  up to 4 weeks   Treatment/Interventions Aspiration precaution training;SLP instruction and feedback;Compensatory strategies;Patient/family education;Pharyngeal strengthening exercises;Compensatory techniques   Potential to Achieve Goals Good   Potential Considerations Ability to  learn/carryover information;Cooperation/participation level   Consulted and Agree with Plan of Care Patient;Family member/caregiver   Family Member Consulted daughater Susan [name]      Patient will benefit from skilled therapeutic intervention in order to improve the following deficits and impairments:   Dysphagia, pharyngeal phase      G-Codes - 07-Oct-2016 1258    Functional Assessment Tool Used asha noms, MBS results   Functional Limitations Swallowing   Swallow Current Status (Z6109) At least 20 percent but less than 40 percent impaired, limited or restricted   Swallow Goal Status (U0454) At least 1 percent but less than 20 percent impaired, limited or restricted      Problem List Patient Active Problem List   Diagnosis Date Noted  . Aspiration into airway 08/17/2016  . Unstable angina ([place]) 07/08/2016  . Mixed hyperlipidemia   . Aortic atherosclerosis ([place]) 07/06/2016  . Coronary artery disease involving native coronary artery of native heart with unstable angina pectoris ([place]) 07/06/2016  . Klebsiella sepsis ([place])  04/26/2016  . E coli bacteremia 04/26/2016  . Acute respiratory failure with hypoxia ([place]) 04/18/2016  . Near syncope 10/08/2015  . Transient vision disturbance, right 10/08/2015  . Balance problem 10/08/2015  . ILD (interstitial lung disease) ([place]) 07/29/2015  . UTI (urinary tract infection) 06/23/2015  . Nasal congestion due to prolonged use of decongestants 12/09/2014  . Chronic insomnia 12/09/2014  . Primary osteoarthritis of left knee 10/03/2014  . Primary osteoarthritis of right knee 08/08/2014  . Elevated blood-pressure reading without diagnosis of hypertension 07/31/2014  . Osteoarthritis of right knee, Severe 10/28/2013  . Glenohumeral arthritis, Severe 08/20/2013  . Centrilobular emphysema ([place]) 03/25/2013  . Solitary pulmonary nodule 02/19/2013  . Shortness of breath 02/12/2013  . Chronic knee pain 01/15/2013  . Obstructive sleep apnea 10/11/2012  .  Umbilical hernia 80/99/8338  . Urinary incontinence, urge 01/17/2012  . Diastolic dysfunction 25/09/3974  . Preoperative cardiovascular examination 04/21/2011  . DDD (degenerative disc disease) 10/26/2010  . RESTLESS LEG SYNDROME 02/23/2010  . HIP REPLACEMENT, LEFT, HX OF 12/21/2009  . FOOT PAIN, BILATERAL 03/25/2009  . HYPERCHOLESTEROLEMIA 06/18/2008  . ALLERGIC RHINITIS 05/21/2008  . ANKLE PAIN, BILATERAL 05/21/2008  . DIVERTICULOSIS, COLON 11/26/2007  . 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 [place], MSP, CCC-SLP  Shonna Chock 09/19/2016, 1:01 PM  [place] MAIN [facility] SERVICES [address] [place], [place], [address] Phone: [phone]   Fax:  [phone]  Name: Susan [name] MRN: [id number] Date of Birth: [birth year]

## 2016-09-19 NOTE — Patient Instructions (Addendum)
  Super-Supraglottic Swallowing Maneuver  To close the airway at the level of the vocal folds before and during the swallow, to increase tongue base retraction and pressure generation, and to clear residue after the swallow. This exercise is recommended because you exhibited penetration and aspiration into the airway.   1. Hold your breath very tightly, bearing down.  2. Continue to hold your breath tightly while swallowing as hard as you can  3. IMMEDIATELY clear your throat  4. Swallow a second time  Caution: Bearing down may increase blood pressure.   Vocal Fold Adduction (holding breath tightly bearing down) can be done between meals as an exercise.  Safe swallow precautions: Awake and alert Upright Meds whole with liquid Oral care before meals (explained rationale) NO straws Cough/clear throat after the swallow Small bites/sips Slow Rate Minimize distractions during meals  Consider ENT consult for direct visualization of vocal folds

## 2016-09-20 ENCOUNTER — Ambulatory Visit: Payer: Medicare HMO | Admitting: Speech Pathology

## 2016-09-21 ENCOUNTER — Other Ambulatory Visit: Payer: Self-pay | Admitting: Family Medicine

## 2016-09-22 ENCOUNTER — Ambulatory Visit: Payer: Medicare HMO | Attending: Pulmonary Disease | Admitting: Speech Pathology

## 2016-09-22 DIAGNOSIS — R1313 Dysphagia, pharyngeal phase: Secondary | ICD-10-CM | POA: Insufficient documentation

## 2016-09-26 ENCOUNTER — Ambulatory Visit: Payer: Medicare HMO | Admitting: Speech Pathology

## 2016-09-26 DIAGNOSIS — R1313 Dysphagia, pharyngeal phase: Secondary | ICD-10-CM | POA: Diagnosis not present

## 2016-09-27 ENCOUNTER — Encounter: Payer: Self-pay | Admitting: Speech Pathology

## 2016-09-27 NOTE — Therapy (Signed)
[place] MAIN [facility] SERVICES [place], [place], [address] Phone: [phone]   Fax:  [phone]  Speech Language Pathology Treatment  Patient Details  Name: Susan [name] MRN: [id number] Date of Birth: 18-Sep-1940 Referring Provider: Dr. Lake Bells  Encounter Date: 09/26/2016      End of Session - 09/27/16 1116    Visit Number 2   Number of Visits 8   Date for SLP Re-Evaluation 10/21/16   SLP Start Time 1450   SLP Stop Time  1540   SLP Time Calculation (min) 50 min   Activity Tolerance Patient tolerated treatment well      Past Medical History:  Diagnosis Date  . Acute peptic ulcer, unspecified site, with hemorrhage and perforation, without mention of obstruction   . Arthritis   . Asthma   . Balance problems    unsteady on feet at time  . Chronic airway obstruction, not elsewhere classified    on inhalers  . Diverticulosis of colon (without mention of hemorrhage)   . Hematochezia   . 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 Ventral hernia     Past Surgical History:  Procedure Laterality Date  . APPENDECTOMY    . BUNIONECTOMY Bilateral   . COSMETIC SURGERY  1970's   abdomen. Tummy Tuck  . FOOT SURGERY Bilateral dec 2015 right    left 3 yrs ago  . RIGHT/LEFT HEART CATH AND CORONARY ANGIOGRAPHY Bilateral 07/08/2016   Procedure: Right/Left Heart Cath and Coronary Angiography;  Surgeon: Minna Merritts, MD;  Location: [place] [facility];  Service: Cardiovascular;  Laterality: Bilateral;  . TOTAL HIP ARTHROPLASTY  2007   left  . TOTAL HIP ARTHROPLASTY     bilateral   . TOTAL KNEE ARTHROPLASTY Right 08/08/2014   Procedure: RIGHT TOTAL KNEE ARTHROPLASTY WITH NAVIGATION;  Surgeon: Rod Can, MD;  Location: [facility];  Service: Orthopedics;  Laterality: Right;  . TOTAL KNEE ARTHROPLASTY Left 10/03/2014   Procedure: LEFT TOTAL KNEE ARTHROPLASTY;  Surgeon: Rod Can, MD;  Location: [facility];  Service:  Orthopedics;  Laterality: Left;  . TUBAL LIGATION    . UMBILICAL HERNIA REPAIR     2016    There were no vitals filed for this visit.      Subjective Assessment - 09/27/16 1115    Subjective "Is that right?"   Currently in Pain? No/denies               ADULT SLP TREATMENT - 09/27/16 0001      General Information   Behavior/Cognition Alert;Cooperative;Pleasant mood;Confused;Requires cueing   HPI Susan [name] is a 76 year old female arriving for follow up after outpatient MBS, referred by Dr. Lake Bells. Per his notes, pts primary complaint has been shortness of breath and has been found to have emphysema and interstitial lung disease with progression of fibrotic findings on CT in lower lobes bilaterally. Lung function testing is suggestive of neuromuscular weakness or restrictive lung disease due to body habitus. Dr Lake Bells sent her for [facility] to determine if recurrent aspiration could be a contributing factor. She denies any symptoms of aspiration, but is noted to clear her throat frequently prior to PO being given. MBS results indicated moderate dysphagia, characterized by reduced laryngeal closure during the swallow, resulting in silent penetration and aspiration. Tongue base strength, hyolaryngeal elevation WFL. Outpatient follow up was recommended to review results and provide education on specific swallowing maneuvers and exercises.      Treatment Provided  Treatment provided Dysphagia     Dysphagia Treatment   Other treatment/comments DYSPHAGIA/PATIENT EDUCATION: Review the super supraglottic swallow maneuver.  The patient is having a difficult time learning sequence of maneuver accurately.  Have simplified the wording of the sequence, which has improved her accuracy.  She can state the sequence, reason for using this maneuver, and state why she should not inhale prior to last step.  She is instructed to practice with a puree to learn the sequence and to find a partner to help her  judge her accuracy.     Pain Assessment   Pain Assessment No/denies pain     Assessment / Recommendations / Plan   Plan Continue with current plan of care     Progression Toward Goals   Progression toward goals Progressing toward goals          SLP Education - 09/27/16 1115    Education provided Yes   Education Details Super Suprglottic Swallow   Person(s) Educated Patient   Methods Explanation;Handout   Comprehension Verbalized understanding;Verbal cues required;Need further instruction            SLP Long Term Goals - 09/19/16 1256      SLP LONG TERM GOAL #1   Title Pt will demonstrate dysphagia HEP independently   Baseline max cues required - teaching new technique   Time 4   Period Weeks   Status New     SLP LONG TERM GOAL #2   Title Pt will report at least daily completion of HEP/swallow maneuvers between therapies.   Baseline HEP just provided today   Time 4   Period Weeks   Status New          Plan - 09/27/16 1117    Clinical Impression Statement The patient is having significant difficulty learning the sequence for the super supraglottic swallow maneuver.  She has been given written, but simplified, instructions.  Recommend that she practice with puree prior to implementing with liquids.  She is at greater risk of aspiration when she is not performing the sequence accurately.   Speech Therapy Frequency 2x / week   Duration 4 weeks   Treatment/Interventions Aspiration precaution training;SLP instruction and feedback;Compensatory strategies;Patient/family education;Pharyngeal strengthening exercises;Compensatory techniques   Potential to Achieve Goals Good   Potential Considerations Ability to learn/carryover information;Cooperation/participation level   SLP Home Exercise Plan Super Supraglottic Swallow   Consulted and Agree with Plan of Care Patient      Patient will benefit from skilled therapeutic intervention in order to improve the following  deficits and impairments:   Dysphagia, pharyngeal phase    Problem List Patient Active Problem List   Diagnosis Date Noted  . Aspiration into airway 08/17/2016  . Unstable angina ([place]) 07/08/2016  . Mixed hyperlipidemia   . Aortic atherosclerosis ([place]) 07/06/2016  . Coronary artery disease involving native coronary artery of native heart with unstable angina pectoris ([place]) 07/06/2016  . Klebsiella sepsis ([place]) 04/26/2016  . E coli bacteremia 04/26/2016  . Acute respiratory failure with hypoxia ([place]) 04/18/2016  . Near syncope 10/08/2015  . Transient vision disturbance, right 10/08/2015  . Balance problem 10/08/2015  . ILD (interstitial lung disease) ([place]) 07/29/2015  . UTI (urinary tract infection) 06/23/2015  . Nasal congestion due to prolonged use of decongestants 12/09/2014  . Chronic insomnia 12/09/2014  . Primary osteoarthritis of left knee 10/03/2014  . Primary osteoarthritis of right knee 08/08/2014  . Elevated blood-pressure reading without diagnosis of hypertension 07/31/2014  . Osteoarthritis of  right knee, Severe 10/28/2013  . Glenohumeral arthritis, Severe 08/20/2013  . Centrilobular emphysema ([place]) 03/25/2013  . Solitary pulmonary nodule 02/19/2013  . Shortness of breath 02/12/2013  . Chronic knee pain 01/15/2013  . Obstructive sleep apnea 10/11/2012  . Umbilical hernia 36/64/4034  . Urinary incontinence, urge 01/17/2012  . Diastolic dysfunction 74/25/9563  . Preoperative cardiovascular examination 04/21/2011  . DDD (degenerative disc disease) 10/26/2010  . RESTLESS LEG SYNDROME 02/23/2010  . HIP REPLACEMENT, LEFT, HX OF 12/21/2009  . FOOT PAIN, BILATERAL 03/25/2009  . HYPERCHOLESTEROLEMIA 06/18/2008  . ALLERGIC RHINITIS 05/21/2008  . ANKLE PAIN, BILATERAL 05/21/2008  . DIVERTICULOSIS, COLON 11/26/2007  . 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

## 2016-09-28 ENCOUNTER — Ambulatory Visit: Payer: Medicare HMO | Admitting: Pulmonary Disease

## 2016-09-28 ENCOUNTER — Ambulatory Visit: Payer: Medicare HMO | Admitting: Speech Pathology

## 2016-10-03 ENCOUNTER — Encounter: Payer: Self-pay | Admitting: Speech Pathology

## 2016-10-03 ENCOUNTER — Ambulatory Visit: Payer: Medicare HMO | Admitting: Speech Pathology

## 2016-10-03 DIAGNOSIS — R1313 Dysphagia, pharyngeal phase: Secondary | ICD-10-CM

## 2016-10-03 NOTE — Therapy (Signed)
[place] MAIN [facility] SERVICES [address] [place], [place], [address] Phone: [phone]   Fax:  [phone]  Speech Language Pathology Treatment  Patient Details  Name: Susan [name] MRN: [id number] Date of Birth: [birth year] Referring Provider: Dr. Lake Bells  Encounter Date: 10/03/2016      End of Session - 10/03/16 1451    Visit Number 3   Number of Visits 8   Date for SLP Re-Evaluation 10/21/16   SLP Start Time 1400   SLP Stop Time  1445   SLP Time Calculation (min) 45 min   Activity Tolerance Patient tolerated treatment well      Past Medical History:  Diagnosis Date  . Acute peptic ulcer, unspecified site, with hemorrhage and perforation, without mention of obstruction   . Arthritis   . Asthma   . Balance problems    unsteady on feet at time  . Chronic airway obstruction, not elsewhere classified    on inhalers  . Diverticulosis of colon (without mention of hemorrhage)   . Hematochezia   . 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 Ventral hernia     Past Surgical History:  Procedure Laterality Date  . APPENDECTOMY    . BUNIONECTOMY Bilateral   . COSMETIC SURGERY  1970's   abdomen. Tummy Tuck  . FOOT SURGERY Bilateral dec 2015 right    left 3 yrs ago  . RIGHT/LEFT HEART CATH AND CORONARY ANGIOGRAPHY Bilateral 07/08/2016   Procedure: Right/Left Heart Cath and Coronary Angiography;  Surgeon: Minna Merritts, MD;  Location: [facility];  Service: Cardiovascular;  Laterality: Bilateral;  . TOTAL HIP ARTHROPLASTY  2007   left  . TOTAL HIP ARTHROPLASTY     bilateral   . TOTAL KNEE ARTHROPLASTY Right 08/08/2014   Procedure: RIGHT TOTAL KNEE ARTHROPLASTY WITH NAVIGATION;  Surgeon: Rod Can, MD;  Location: [facility];  Service: Orthopedics;  Laterality: Right;  . TOTAL KNEE ARTHROPLASTY Left 10/03/2014   Procedure: LEFT TOTAL KNEE ARTHROPLASTY;  Surgeon: Rod Can, MD;  Location: [facility];  Service:  Orthopedics;  Laterality: Left;  . TUBAL LIGATION    . UMBILICAL HERNIA REPAIR     2016    There were no vitals filed for this visit.      Subjective Assessment - 10/03/16 1450    Subjective "Is that right?"   Currently in Pain? No/denies               ADULT SLP TREATMENT - 10/03/16 0001      General Information   Behavior/Cognition Alert;Cooperative;Pleasant mood;Confused;Requires cueing   HPI Susan [name] is a 76 year old female arriving for follow up after outpatient MBS, referred by Dr. Lake Bells. Per his notes, pts primary complaint has been shortness of breath and has been found to have emphysema and interstitial lung disease with progression of fibrotic findings on CT in lower lobes bilaterally. Lung function testing is suggestive of neuromuscular weakness or restrictive lung disease due to body habitus. Dr Lake Bells sent her for [facility] to determine if recurrent aspiration could be a contributing factor. She denies any symptoms of aspiration, but is noted to clear her throat frequently prior to PO being given. MBS results indicated moderate dysphagia, characterized by reduced laryngeal closure during the swallow, resulting in silent penetration and aspiration. Tongue base strength, hyolaryngeal elevation WFL. Outpatient follow up was recommended to review results and provide education on specific swallowing maneuvers and exercises.      Treatment Provided  Treatment provided Dysphagia     Dysphagia Treatment   Other treatment/comments DYSPHAGIA/PATIENT EDUCATION: Review the super supraglottic swallow maneuver.  The patient is having a difficult time learning sequence of maneuver accurately.  Have simplified the wording of the sequence, which has improved her accuracy.  She can state the sequence, reason for using this maneuver, and state why she should not inhale prior to last step.  Patient correctly organized the sequence for 18 of 24 puree swallows.     Pain Assessment   Pain  Assessment No/denies pain     Assessment / Recommendations / Plan   Plan Continue with current plan of care     Progression Toward Goals   Progression toward goals Progressing toward goals          SLP Education - 10/03/16 1450    Education provided Yes   Education Details Super Suprglottic Swallow, importance of stringent oral care   Person(s) Educated Patient   Methods Explanation;Handout   Comprehension Verbalized understanding            SLP Long Term Goals - 09/19/16 1256      SLP LONG TERM GOAL #1   Title Pt will demonstrate dysphagia HEP independently   Baseline max cues required - teaching new technique   Time 4   Period Weeks   Status New     SLP LONG TERM GOAL #2   Title Pt will report at least daily completion of HEP/swallow maneuvers between therapies.   Baseline HEP just provided today   Time 4   Period Weeks   Status New          Plan - 10/03/16 1451    Clinical Impression Statement The patient is having significant difficulty learning the sequence for the super supraglottic swallow maneuver.  She was successful 75% of swallows today.  She has been given written, but simplified, instructions.  Recommend that she practice with puree prior to implementing with liquids.  She is at greater risk of aspiration when she is not performing the sequence accurately.   Speech Therapy Frequency 2x / week   Duration 4 weeks   Treatment/Interventions Aspiration precaution training;SLP instruction and feedback;Compensatory strategies;Patient/family education;Pharyngeal strengthening exercises;Compensatory techniques   Potential to Achieve Goals Good   Potential Considerations Ability to learn/carryover information;Cooperation/participation level   SLP Home Exercise Plan Super Supraglottic Swallow   Consulted and Agree with Plan of Care Patient      Patient will benefit from skilled therapeutic intervention in order to improve the following deficits and  impairments:   Dysphagia, pharyngeal phase    Problem List Patient Active Problem List   Diagnosis Date Noted  . Aspiration into airway 08/17/2016  . Unstable angina ([place]) 07/08/2016  . Mixed hyperlipidemia   . Aortic atherosclerosis ([place]) 07/06/2016  . Coronary artery disease involving native coronary artery of native heart with unstable angina pectoris ([place]) 07/06/2016  . Klebsiella sepsis ([place]) 04/26/2016  . E coli bacteremia 04/26/2016  . Acute respiratory failure with hypoxia ([place]) 04/18/2016  . Near syncope 10/08/2015  . Transient vision disturbance, right 10/08/2015  . Balance problem 10/08/2015  . ILD (interstitial lung disease) ([place]) 07/29/2015  . UTI (urinary tract infection) 06/23/2015  . Nasal congestion due to prolonged use of decongestants 12/09/2014  . Chronic insomnia 12/09/2014  . Primary osteoarthritis of left knee 10/03/2014  . Primary osteoarthritis of right knee 08/08/2014  . Elevated blood-pressure reading without diagnosis of hypertension 07/31/2014  . Osteoarthritis of right knee, Severe  10/28/2013  . Glenohumeral arthritis, Severe 08/20/2013  . Centrilobular emphysema ([place]) 03/25/2013  . Solitary pulmonary nodule 02/19/2013  . Shortness of breath 02/12/2013  . Chronic knee pain 01/15/2013  . Obstructive sleep apnea 10/11/2012  . Umbilical hernia 45/80/9983  . Urinary incontinence, urge 01/17/2012  . Diastolic dysfunction 38/25/0539  . Preoperative cardiovascular examination 04/21/2011  . DDD (degenerative disc disease) 10/26/2010  . RESTLESS LEG SYNDROME 02/23/2010  . HIP REPLACEMENT, LEFT, HX OF 12/21/2009  . FOOT PAIN, BILATERAL 03/25/2009  . HYPERCHOLESTEROLEMIA 06/18/2008  . ALLERGIC RHINITIS 05/21/2008  . ANKLE PAIN, BILATERAL 05/21/2008  . DIVERTICULOSIS, COLON 11/26/2007  . 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

## 2016-10-06 ENCOUNTER — Ambulatory Visit: Payer: Medicare HMO | Admitting: Speech Pathology

## 2016-10-10 ENCOUNTER — Encounter: Payer: Self-pay | Admitting: Speech Pathology

## 2016-10-10 ENCOUNTER — Ambulatory Visit: Payer: Medicare HMO | Admitting: Speech Pathology

## 2016-10-10 DIAGNOSIS — R1313 Dysphagia, pharyngeal phase: Secondary | ICD-10-CM

## 2016-10-10 NOTE — Therapy (Signed)
[place] MAIN [facility] SERVICES [address] [place], [place], [address] Phone: [phone]   Fax:  [phone]  Speech Language Pathology Treatment  Patient Details  Name: Susan [name] MRN: [id number] Date of Birth: 02/22/1941 Referring Provider: Dr. Lake Bells  Encounter Date: 10/10/2016      End of Session - 10/10/16 0932    Visit Number 4   Date for SLP Re-Evaluation 10/21/16   SLP Start Time 0855   SLP Stop Time  0930   SLP Time Calculation (min) 35 min   Activity Tolerance Patient tolerated treatment well      Past Medical History:  Diagnosis Date  . Acute peptic ulcer, unspecified site, with hemorrhage and perforation, without mention of obstruction   . Arthritis   . Asthma   . Balance problems    unsteady on feet at time  . Chronic airway obstruction, not elsewhere classified    on inhalers  . Diverticulosis of colon (without mention of hemorrhage)   . Hematochezia   . 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 Ventral hernia     Past Surgical History:  Procedure Laterality Date  . APPENDECTOMY    . BUNIONECTOMY Bilateral   . COSMETIC SURGERY  1970's   abdomen. Tummy Tuck  . FOOT SURGERY Bilateral dec 2015 right    left 3 yrs ago  . RIGHT/LEFT HEART CATH AND CORONARY ANGIOGRAPHY Bilateral 07/08/2016   Procedure: Right/Left Heart Cath and Coronary Angiography;  Surgeon: Minna Merritts, MD;  Location: [place] [facility];  Service: Cardiovascular;  Laterality: Bilateral;  . TOTAL HIP ARTHROPLASTY  2007   left  . TOTAL HIP ARTHROPLASTY     bilateral   . TOTAL KNEE ARTHROPLASTY Right 08/08/2014   Procedure: RIGHT TOTAL KNEE ARTHROPLASTY WITH NAVIGATION;  Surgeon: Rod Can, MD;  Location: [facility];  Service: Orthopedics;  Laterality: Right;  . TOTAL KNEE ARTHROPLASTY Left 10/03/2014   Procedure: LEFT TOTAL KNEE ARTHROPLASTY;  Surgeon: Rod Can, MD;  Location: [facility];  Service: Orthopedics;  Laterality:  Left;  . TUBAL LIGATION    . UMBILICAL HERNIA REPAIR     2016    There were no vitals filed for this visit.      Subjective Assessment - 10/10/16 0931    Subjective "I figure I'm doing okay"   Currently in Pain? No/denies               ADULT SLP TREATMENT - 10/10/16 0001      General Information   Behavior/Cognition Alert;Cooperative;Pleasant mood;Confused;Requires cueing   HPI Susan [name] is a 76 year old female arriving for follow up after outpatient MBS, referred by Dr. Lake Bells. Per his notes, pts primary complaint has been shortness of breath and has been found to have emphysema and interstitial lung disease with progression of fibrotic findings on CT in lower lobes bilaterally. Lung function testing is suggestive of neuromuscular weakness or restrictive lung disease due to body habitus. Dr Lake Bells sent her for [facility] to determine if recurrent aspiration could be a contributing factor. She denies any symptoms of aspiration, but is noted to clear her throat frequently prior to PO being given. MBS results indicated moderate dysphagia, characterized by reduced laryngeal closure during the swallow, resulting in silent penetration and aspiration. Tongue base strength, hyolaryngeal elevation WFL. Outpatient follow up was recommended to review results and provide education on specific swallowing maneuvers and exercises.      Treatment Provided   Treatment provided  Dysphagia     Dysphagia Treatment   Other treatment/comments DYSPHAGIA/PATIENT EDUCATION: Review the super supraglottic swallow maneuver.  The patient is having a difficult time learning sequence of maneuver accurately.  Have simplified the wording of the sequence, which has improved her accuracy.  She can state the sequence, reason for using this maneuver, and state why she should not inhale prior to last step.  Patient correctly organized the sequence for 22 of 25 puree swallows.     Pain Assessment   Pain Assessment  No/denies pain     Assessment / Recommendations / Plan   Plan Continue with current plan of care     Progression Toward Goals   Progression toward goals Progressing toward goals          SLP Education - 10/10/16 0932    Education provided Yes   Education Details Super Suprglottic Swallow, importance of stringent oral care   Person(s) Educated Patient   Methods Explanation   Comprehension Verbalized understanding            SLP Long Term Goals - 09/19/16 1256      SLP LONG TERM GOAL #1   Title Pt will demonstrate dysphagia HEP independently   Baseline max cues required - teaching new technique   Time 4   Period Weeks   Status New     SLP LONG TERM GOAL #2   Title Pt will report at least daily completion of HEP/swallow maneuvers between therapies.   Baseline HEP just provided today   Time 4   Period Weeks   Status New          Plan - 10/10/16 4403    Clinical Impression Statement The patient is learning the sequence for the super supraglottic swallow maneuver.  She was successful 88% of swallows today.  She has been given written, but simplified, instructions.  Recommend that she practice with puree prior to implementing with liquids.  She is at greater risk of aspiration when she is not performing the sequence accurately.   Speech Therapy Frequency 2x / week   Duration 4 weeks   Treatment/Interventions Aspiration precaution training;SLP instruction and feedback;Compensatory strategies;Patient/family education;Pharyngeal strengthening exercises;Compensatory techniques   Potential to Achieve Goals Good   Potential Considerations Ability to learn/carryover information;Cooperation/participation level   SLP Home Exercise Plan Super Supraglottic Swallow   Consulted and Agree with Plan of Care Patient      Patient will benefit from skilled therapeutic intervention in order to improve the following deficits and impairments:   Dysphagia, pharyngeal phase    Problem  List Patient Active Problem List   Diagnosis Date Noted  . Aspiration into airway 08/17/2016  . Unstable angina ([place]) 07/08/2016  . Mixed hyperlipidemia   . Aortic atherosclerosis ([place]) 07/06/2016  . Coronary artery disease involving native coronary artery of native heart with unstable angina pectoris ([place]) 07/06/2016  . Klebsiella sepsis ([place]) 04/26/2016  . E coli bacteremia 04/26/2016  . Acute respiratory failure with hypoxia ([place]) 04/18/2016  . Near syncope 10/08/2015  . Transient vision disturbance, right 10/08/2015  . Balance problem 10/08/2015  . ILD (interstitial lung disease) ([place]) 07/29/2015  . UTI (urinary tract infection) 06/23/2015  . Nasal congestion due to prolonged use of decongestants 12/09/2014  . Chronic insomnia 12/09/2014  . Primary osteoarthritis of left knee 10/03/2014  . Primary osteoarthritis of right knee 08/08/2014  . Elevated blood-pressure reading without diagnosis of hypertension 07/31/2014  . Osteoarthritis of right knee, Severe 10/28/2013  . Glenohumeral arthritis,  Severe 08/20/2013  . Centrilobular emphysema ([place]) 03/25/2013  . Solitary pulmonary nodule 02/19/2013  . Shortness of breath 02/12/2013  . Chronic knee pain 01/15/2013  . Obstructive sleep apnea 10/11/2012  . Umbilical hernia 25/85/2778  . Urinary incontinence, urge 01/17/2012  . Diastolic dysfunction 24/23/5361  . Preoperative cardiovascular examination 04/21/2011  . DDD (degenerative disc disease) 10/26/2010  . RESTLESS LEG SYNDROME 02/23/2010  . HIP REPLACEMENT, LEFT, HX OF 12/21/2009  . FOOT PAIN, BILATERAL 03/25/2009  . HYPERCHOLESTEROLEMIA 06/18/2008  . ALLERGIC RHINITIS 05/21/2008  . ANKLE PAIN, BILATERAL 05/21/2008  . DIVERTICULOSIS, COLON 11/26/2007  . 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 [place], [place], [address] Phone: [phone]   Fax:  [phone]   Name: Susan [name] MRN: [id number] Date of Birth: [birth year]

## 2016-10-11 ENCOUNTER — Other Ambulatory Visit: Payer: Self-pay | Admitting: Pulmonary Disease

## 2016-10-12 ENCOUNTER — Ambulatory Visit: Payer: Medicare HMO | Admitting: Speech Pathology

## 2016-10-18 ENCOUNTER — Ambulatory Visit: Payer: Medicare HMO | Admitting: Speech Pathology

## 2016-10-19 ENCOUNTER — Ambulatory Visit: Payer: Medicare HMO | Admitting: Speech Pathology

## 2016-10-20 ENCOUNTER — Ambulatory Visit: Payer: Medicare HMO | Admitting: Speech Pathology

## 2016-10-24 ENCOUNTER — Ambulatory Visit: Payer: Medicare HMO | Admitting: Speech Pathology

## 2016-10-24 ENCOUNTER — Telehealth: Payer: Self-pay

## 2016-10-24 NOTE — Telephone Encounter (Signed)
Pt walked in requesting refills on ventolin and symbicort inhalers; pt has not contacted [facility]; I spoke with [facility] and pt has refills on both inhalers from Dr Lake Bells. Pt request both inhalers filled. [facility] will get ready. Pt has appt with Dr Lake Bells this month and will ck with Dr Lake Bells or our office if needed. For now pt will go by [facility] and pick up refills. Pt did not appear in distress.

## 2016-10-26 ENCOUNTER — Ambulatory Visit: Payer: Medicare HMO | Admitting: Speech Pathology

## 2016-10-27 ENCOUNTER — Telehealth: Payer: Self-pay | Admitting: Pulmonary Disease

## 2016-10-27 NOTE — Telephone Encounter (Signed)
[email or web address] PA initiated today on the pt.    Key: [facility] [birth year]  Will forward to ashley to follow up on.  Thanks

## 2016-10-28 NOTE — Telephone Encounter (Signed)
Kaitlyn from [facility] calling for diagnosis code.  Sending a fax over as well.  CB is [phone].

## 2016-10-28 NOTE — Telephone Encounter (Signed)
[employer] and spoke with Azzelyn. PA was started on Symbicort. They needed a diagnosis code for this PA. This has been given to the representative. PA has been approved through 05/22/17. Nothing further was needed.

## 2016-10-31 ENCOUNTER — Ambulatory Visit: Payer: Medicare HMO | Admitting: Speech Pathology

## 2016-11-02 ENCOUNTER — Ambulatory Visit: Payer: Medicare HMO | Admitting: Speech Pathology

## 2016-11-07 ENCOUNTER — Ambulatory Visit: Payer: Medicare HMO | Admitting: Speech Pathology

## 2016-11-09 ENCOUNTER — Ambulatory Visit: Payer: Medicare HMO | Admitting: Speech Pathology

## 2016-11-15 ENCOUNTER — Encounter: Payer: Self-pay | Admitting: Pulmonary Disease

## 2016-11-15 ENCOUNTER — Ambulatory Visit (INDEPENDENT_AMBULATORY_CARE_PROVIDER_SITE_OTHER): Payer: Medicare HMO | Admitting: Pulmonary Disease

## 2016-11-15 VITALS — BP 130/80 | HR 78 | Ht 60.0 in | Wt 207.0 lb

## 2016-11-15 DIAGNOSIS — T17908A Unspecified foreign body in respiratory tract, part unspecified causing other injury, initial encounter: Secondary | ICD-10-CM | POA: Diagnosis not present

## 2016-11-15 DIAGNOSIS — J432 Centrilobular emphysema: Secondary | ICD-10-CM | POA: Diagnosis not present

## 2016-11-15 DIAGNOSIS — G4733 Obstructive sleep apnea (adult) (pediatric): Secondary | ICD-10-CM

## 2016-11-15 DIAGNOSIS — I519 Heart disease, unspecified: Secondary | ICD-10-CM | POA: Diagnosis not present

## 2016-11-15 DIAGNOSIS — I5189 Other ill-defined heart diseases: Secondary | ICD-10-CM

## 2016-11-15 DIAGNOSIS — J849 Interstitial pulmonary disease, unspecified: Secondary | ICD-10-CM

## 2016-11-15 DIAGNOSIS — R1310 Dysphagia, unspecified: Secondary | ICD-10-CM

## 2016-11-15 NOTE — Progress Notes (Signed)
Subjective:    Patient ID: Susan [name], female    DOB: 1941-02-24, 76 y.o.   MRN: [id number]  Synopsis: [name] first saw the [facility] pulmonary clinic in September 2014 for shortness of breath. She had a previous heavy smoking history but he quit by the time she saw me. Lung function testing was actually not consistent with obstruction but she did have emphysema on her chest CT. Spiriva was started empirically for that. She also had a home sleep study which showed an apnea hypopnea index of 44. A follow-up split-night study showed an AHI of only 18, CPAP was titrated to 10 cm of water but she was only able to wear it for approximately 15 minutes. She has had difficulty using CPAP when prescribed in the past.  In 2017 she developed worsening fibrotic changes due to silent aspiration.     HPI  Chief Complaint  Patient presents with  . Follow-up    pt c/o worsening sob with any exertion.     [name] is still struggling with her dyspnea.  Dysphagia: > she completed speech therapy which helped a little > she has been trying to cough a little with each swallow > she is following her instructions carefully  She is still working daily.  She hasn't been to exercise yet.  She takes the stairs a few times per day.  She can walk on level ground without too much difficulty.  She couldn't afford pulmonary rehab. She is considering going to [employer] [facility] but she doesn't want to be bother     Past Medical History:  Diagnosis Date  . Acute peptic ulcer, unspecified site, with hemorrhage and perforation, without mention of obstruction   . Arthritis   . Asthma   . Balance problems    unsteady on feet at time  . Chronic airway obstruction, not elsewhere classified    on inhalers  . Diverticulosis of colon (without mention of hemorrhage)   . Hematochezia   . 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 Ventral hernia      Review of Systems  Constitutional: Negative  for chills, fatigue and fever.  HENT: Negative for congestion, nosebleeds, postnasal drip and rhinorrhea.   Respiratory: Negative for cough, shortness of breath and wheezing.   Cardiovascular: Negative for chest pain, palpitations and leg swelling.       Objective:   Physical Exam  Vitals:   11/15/16 1457  BP: 130/80  Pulse: 78  SpO2: 94%  Weight: 207 lb (93.9 kg)  Height: 5' (1.524 m)  RA  Gen: obese but well appearing HENT: OP clear, TM's clear, neck supple PULM: Crackles bilaterally B, normal percussion CV: RRR, no mgr, trace edema GI: BS+, soft, nontender Derm: no cyanosis or rash Psyche: normal mood and affect    Imaging: May 2014 chest x-ray no acute cardiopulmonary abnormality, questionable hyperinflation 01/2013 CT chest 70mm sub pleural nodule, mild emphysema 01/2013 CT chest 26mm sub pleural nodule, mild emphysema 04/22/2013 CT chest > 49mm nodule unchanged 08/2014 CT chest > nodule unchanged, progressive interstitial infilatrate bilateral lower lobes of uncertain etiology April 2017 high-resolution CT chest: Findings worrisome for slightly progressive but nonspecific interstitial lung disease. 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 (Personally reviewed again 03/18/2016). 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 May be due to interstitial lung disease such as nonspecific interstitial pneumonitis. 08/10/2016 modified barium swallow showed dysphagia and moderate aspiration risk  PFT: June 2014 full pulmonary function test at [facility] ratio 80%, FEV1 1.74 L (101% predicted) total lung capacity is 3.81 L (91% predicted), DLCO 11.0 (56% predicted) 09/2015 PFT repeat > 86% FVC 2.03L  (85% pred), FEV1 1.72L (96% pred), TLC 3.98L (89% pred), DLCO 12.12 (64% pred) November 2017 pulmonary function testing ratio normal, FEV1 1.73 L, 98% predicted, FVC 1.98 L 83% predicted, total lung capacity 3.90 L 87% predicted, DLCO 12.29 65% predicted March 2018 pulmonary function testing ratio normal, FEV1 1.53 L 87% predicted, FVC 1.75 L 74% predicted, total lung capacity 4.0 L 89% predicted, residual volume normal, ERV 0.13 L 29% predicted, DLCO 12.13 64% predicted March 2018 MIP/MEP> expiratory max 28% predicted, and sponge Remax 68% predicted  Heart catheterization: February 2018 by Dr. Rockey Situ. Proximal left anterior descending 50% obstruction, mid RCA 30%, LVEF 55-65%, LVEDP moderately elevated at 28 mmHg, right heart catheterization showed a mean PA pressure of 28, pulmonary capillary wedge pressure 19.  Labs: 12/2015 ANA, CCP, RF, SCL-70, Jo-1, SSA, SSB, Aldolase all negative  Cardiology records reviewed, starting on diuresis     Assessment & Plan:   ILD (interstitial lung disease) ([place]) - Plan: Pulmonary function test, CANCELED: Pulmonary function test  Dysphagia, unspecified type  Aspiration into airway, initial encounter  Centrilobular emphysema (HCC)  Obstructive sleep apnea  Diastolic dysfunction  Discussion: I don't have any objective evidence that she's worsening though [name] remains profoundly symptomatic from her interstitial lung disease, centrilobular emphysema, and obesity with deconditioning. We believe that her interstitial lung disease is due solely to silent aspiration. She has continued to work with the speech therapy folks to prevent recurrent aspirations.  She's not had any exacerbations of her COPD/centrilobular emphysema since the last visit. She is compliant with Symbicort.  I believe that if she could exercise more frequently she would feel better. She needs to try to lose weight, we've discussed this multiple times.  Plan: For your shortness of  breath: Join silver sneakers and exercise more We will check your oxygen level when you walk today  For your trouble swallowing: Continue to follow the speech therapy recommendations  For your interstitial lung disease: We will arrange a PFT and 6MW next visit (02/2017)  For your emphysema: Keep taking sybmicort  For your obstructive sleep apnea: Keep using CPAP nightly  We will see you back in 02/2017   Updated Medication List Outpatient Encounter Prescriptions as of 11/15/2016  Medication Sig  . ibuprofen (ADVIL,MOTRIN) 200 MG tablet Take 600 mg by mouth every 6 (six) hours as needed for headache or moderate pain.   . Melatonin 10 MG TABS Take 15 mg by mouth at bedtime.  . Naproxen Sod-Diphenhydramine (ALEVE PM) 220-25 MG TABS Take 1 tablet by mouth at bedtime.  . potassium chloride (K-DUR) 10 MEQ tablet Take 1 tablet (10 mEq total) by mouth daily as needed.  Marland Kitchen rOPINIRole (REQUIP) 3 MG tablet TAKE ONE TABLET BY MOUTH EVERY EVENING AT BEDTIME  . rosuvastatin (CRESTOR) 5 MG tablet Take 1 tablet (5 mg total) by mouth daily.  . SYMBICORT 160-4.5 MCG/ACT inhaler TAKE 2 PUFFS INTO LUNGS TWICE A DAY  . traZODone (DESYREL) 50 MG tablet TAKE 1 1/2 TABLETS AT BEDTIME AS NEEDED FOR SLEEP  . valACYclovir (VALTREX) 1000 MG tablet TAKE ONE TABLET BY MOUTH EVERY DAY  . VENTOLIN  HFA 108 (90 Base) MCG/ACT inhaler TAKE 2 PUFFS INTO LUNGS EVERY 6 HOURS ASNEEDED FOR WHEEZING OR SHORTNESS OF BREATH  . furosemide (LASIX) 20 MG tablet Take 1 tablet (20 mg total) by mouth daily as needed. (Patient not taking: Reported on 11/15/2016)  . [DISCONTINUED] fluticasone (FLONASE) 50 MCG/ACT nasal spray Place 2 sprays into both nostrils daily. (Patient not taking: Reported on 11/15/2016)   Facility-Administered Encounter Medications as of 11/15/2016  Medication  . dexamethasone (DECADRON) injection 4 mg  . ondansetron (ZOFRAN) 4 mg in sodium chloride 0.9 % 50 mL IVPB  . ondansetron (ZOFRAN) 4 mg in sodium  chloride 0.9 % 50 mL IVPB

## 2016-11-15 NOTE — Patient Instructions (Addendum)
For your shortness of breath: Join silver sneakers and exercise more We will check your oxygen level when you walk today  For your trouble swallowing: Continue to follow the speech therapy recommendations  For your interstitial lung disease: We will arrange a PFT and 6MW next visit (02/2017)  For your emphysema: Keep taking sybmicort  For your obstructive sleep apnea: Keep using CPAP nightly  We will see you back in 02/2017

## 2016-12-08 ENCOUNTER — Ambulatory Visit (INDEPENDENT_AMBULATORY_CARE_PROVIDER_SITE_OTHER): Payer: Medicare HMO | Admitting: Family Medicine

## 2016-12-08 ENCOUNTER — Encounter: Payer: Self-pay | Admitting: Family Medicine

## 2016-12-08 VITALS — BP 138/69 | HR 94 | Temp 98.7°F | Ht 60.0 in | Wt 206.0 lb

## 2016-12-08 DIAGNOSIS — M19011 Primary osteoarthritis, right shoulder: Secondary | ICD-10-CM

## 2016-12-08 DIAGNOSIS — M19012 Primary osteoarthritis, left shoulder: Secondary | ICD-10-CM | POA: Diagnosis not present

## 2016-12-08 MED ORDER — METHYLPREDNISOLONE ACETATE 40 MG/ML IJ SUSP
80.0000 mg | Freq: Once | INTRAMUSCULAR | Status: AC
  Administered 2016-12-08: 80 mg via INTRA_ARTICULAR

## 2016-12-09 NOTE — Progress Notes (Signed)
   Dr. Frederico Hamman T. Geofrey Silliman, MD, [facility] [place], [address] Phone: [phone] Fax: [phone]  12/08/2016  Patient: Susan [name], MRN: [id number], DOB: August 30, 1940, 76 y.o.  Primary Physician:  [name], MD   Chief Complaint  Patient presents with  . Shoulder Pain    Bilateral Shoulder Injections    The patient has known severe glenohumeral arthritis.  This limits her daily activity and causes pain.  Today on exam, she does have a mild effusion in both shoulder joints.  She has responded fairly well to intra-articular shoulder injection in the past for pain management.  Intrarticular Shoulder Injection, RIGHT Verbal consent was obtained from the patient. Risks including infection explained and contrasted with benefits and alternatives. Patient prepped with Chloraprep and Ethyl Chloride used for anesthesia. An intraarticular shoulder injection was performed using the posterior approach. The patient tolerated the procedure well and had decreased pain post injection. No complications. Injection: 8 cc of Lidocaine 1% and 2 mL Depo-Medrol 40 mg. Needle: 22 gauge   Intrarticular Shoulder Injection, LEFT Verbal consent was obtained from the patient. Risks including infection explained and contrasted with benefits and alternatives. Patient prepped with Chloraprep and Ethyl Chloride used for anesthesia. An intraarticular shoulder injection was performed using the posterior approach. The patient tolerated the procedure well and had decreased pain post injection. No complications. Injection: 8 cc of Lidocaine 1% and 2 mL Depo-Medrol 40 mg. Needle: 22 gauge   Signed,  Adesuwa Osgood T. Patryce Depriest, MD

## 2016-12-12 ENCOUNTER — Telehealth: Payer: Self-pay

## 2016-12-12 NOTE — Telephone Encounter (Signed)
Pt saw Dr Lorelei Pont on 12-08-16 and had injections in both. She said she is hurting more now than she was before the injections. Has tried taking ibuprofen for pain without any relief. She thinks she needs something else for pain. She is aware it will be tomorrow. Michela Pitcher could something be called in to [facility]?

## 2016-12-13 NOTE — Telephone Encounter (Signed)
Needs appt for follow up if she feels she needs a narcotic strength pain med given pain this severe after injection is atypical.  I will CC: Copland, but he is put of town.

## 2016-12-13 NOTE — Telephone Encounter (Signed)
Has she tried tramadol for pain in past? If not .Marland Kitchen Call in tramadol 50 mg BID prn moderate pain #60 O refills

## 2016-12-13 NOTE — Telephone Encounter (Signed)
Left message for Susan [name] that she will need to be seen if she needs a narcotic for the pain per Dr. Diona Browner.  Advised she can continue doing what she is doing to see if things continue to improve or call the office and schedule appointment to get something stronger for pain.

## 2016-12-13 NOTE — Telephone Encounter (Signed)
Spoke with Susan [name].  She states she has taken Tramadol in the past and it didn't help at all.  She is currently taking 2 BC powders once daily and Advil if she needs it.  She is also rubbing her shoulders with Bio Freeze.  She states she has had her shoulders injected numerous times and it has never hurt like this before.  She ask about Percocet which she states she has never taken before.  Please advise.

## 2016-12-20 NOTE — Telephone Encounter (Signed)
Called and LMOM. The only difference possibly would be marcaine this injection as opposed to lidocaine.  We will touch base with her to make sure she is doing ok.

## 2016-12-21 NOTE — Telephone Encounter (Signed)
I spoke to her on the phone. Better after 3 days. Doing well now.

## 2017-01-12 ENCOUNTER — Ambulatory Visit: Payer: Medicare HMO | Admitting: Family Medicine

## 2017-01-12 DIAGNOSIS — R69 Illness, unspecified: Secondary | ICD-10-CM | POA: Diagnosis not present

## 2017-01-12 DIAGNOSIS — I890 Lymphedema, not elsewhere classified: Secondary | ICD-10-CM | POA: Diagnosis not present

## 2017-01-12 DIAGNOSIS — L819 Disorder of pigmentation, unspecified: Secondary | ICD-10-CM | POA: Diagnosis not present

## 2017-01-21 ENCOUNTER — Other Ambulatory Visit: Payer: Self-pay | Admitting: Pulmonary Disease

## 2017-01-30 ENCOUNTER — Other Ambulatory Visit: Payer: Self-pay | Admitting: Family Medicine

## 2017-01-31 ENCOUNTER — Telehealth: Payer: Self-pay | Admitting: Family Medicine

## 2017-01-31 NOTE — Telephone Encounter (Signed)
Left pt message asking to call Ebony Hail back directly at [phone] to schedule AWV + labs with Katha Cabal and CPE with PCP.  *NOTE* Last AWV 01/12/16

## 2017-02-27 ENCOUNTER — Ambulatory Visit: Payer: Medicare HMO | Admitting: Adult Health

## 2017-03-01 NOTE — Telephone Encounter (Signed)
Called pt; no answer, vm full  Pt due to scheduled AWV + labs with Katha Cabal and CPE with PCP.  *NOTE* Last AWV 01/12/16

## 2017-03-07 ENCOUNTER — Encounter: Payer: Self-pay | Admitting: Pulmonary Disease

## 2017-03-07 ENCOUNTER — Ambulatory Visit (INDEPENDENT_AMBULATORY_CARE_PROVIDER_SITE_OTHER): Payer: Medicare HMO | Admitting: Pulmonary Disease

## 2017-03-07 ENCOUNTER — Ambulatory Visit (INDEPENDENT_AMBULATORY_CARE_PROVIDER_SITE_OTHER): Payer: Medicare HMO | Admitting: *Deleted

## 2017-03-07 VITALS — BP 130/80 | HR 94 | Ht 60.0 in | Wt 202.2 lb

## 2017-03-07 DIAGNOSIS — J849 Interstitial pulmonary disease, unspecified: Secondary | ICD-10-CM

## 2017-03-07 DIAGNOSIS — J432 Centrilobular emphysema: Secondary | ICD-10-CM

## 2017-03-07 DIAGNOSIS — R0602 Shortness of breath: Secondary | ICD-10-CM

## 2017-03-07 DIAGNOSIS — G4733 Obstructive sleep apnea (adult) (pediatric): Secondary | ICD-10-CM | POA: Diagnosis not present

## 2017-03-07 DIAGNOSIS — Z23 Encounter for immunization: Secondary | ICD-10-CM | POA: Diagnosis not present

## 2017-03-07 NOTE — Progress Notes (Signed)
Subjective:    Patient ID: Susan [name], female    DOB: January 02, 1941, 76 y.o.   MRN: [id number]  Synopsis: Susan [name] first saw the [name] pulmonary [facility] in September 2014 for shortness of breath. She had a previous heavy smoking history but he quit by the time she saw me. Lung function testing was actually not consistent with obstruction but she did have emphysema on her chest CT. Spiriva was started empirically for that. She also had a home sleep study which showed an apnea hypopnea index of 44. A follow-up split-night study showed an AHI of only 18, CPAP was titrated to 10 cm of water but she was only able to wear it for approximately 15 mintes. She has had difficulty using CPAP when prescribed in the past.   As of 02/2017 she has not been compliant with CPAP.   In 2017 she developed worsening fibrotic changes due to silent aspiration.     HPI  Chief Complaint  Patient presents with  . Follow-up    review 43mw.  pt c/o worsening sob with exertion.     Since the last visit Susan [name] says that she went to the speech therapist at [facility] and she was told to cough after every time she swallows.  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  She has no desire to exercise.  She has not had pneumonia.    She is not using the CPAP.     Past Medical History:  Diagnosis Date  . Acute peptic ulcer, unspecified site, with hemorrhage and perforation, without mention of obstruction   . Arthritis   . Asthma   . Balance problems    unsteady on feet at time  . Chronic airway obstruction, not elsewhere classified    on inhalers  . Diverticulosis of colon (without mention of hemorrhage)   . Hematochezia   .  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 Ventral hernia      Review of Systems  Constitutional: Negative for chills, fatigue and fever.  HENT: Negative for congestion, nosebleeds, postnasal drip and rhinorrhea.   Respiratory: Negative for cough, shortness of breath and wheezing.   Cardiovascular: Negative for chest pain, palpitations and leg swelling.       Objective:   Physical Exam  Vitals:   03/07/17 1453  BP: 130/80  Pulse: 94  SpO2: 97%  Weight: 202 lb 3.2 oz (91.7 kg)  Height: 5' (1.524 m)  RA  Gen: chronically ill appearing, obese HENT: OP clear, TM's clear, neck supple PULM: Wheezing bases B, normal percussion CV: RRR, no mgr, trace edema GI: BS+, soft, nontender Derm: no cyanosis or rash Psyche: normal mood and affect     Imaging: May 2014 chest x-ray no acute cardiopulmonary abnormality, questionable hyperinflation 01/2013 CT chest 28mm sub pleural nodule, mild emphysema 01/2013 CT chest 92mm sub pleural nodule, mild emphysema 04/22/2013 CT chest > 12mm nodule unchanged 08/2014 CT chest > nodule unchanged, progressive interstitial infilatrate bilateral lower lobes of uncertain etiology April 2017 high-resolution CT chest: Findings worrisome for slightly progressive but nonspecific interstitial lung disease. 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 (Personally reviewed again  03/18/2016). 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 May be due to interstitial lung disease such as nonspecific interstitial pneumonitis. 08/10/2016 modified barium swallow showed dysphagia and moderate aspiration risk  PFT: June 2014 full pulmonary function test at [facility] ratio 80%, FEV1  1.74 L (101% predicted) total lung capacity is 3.81 L (91% predicted), DLCO 11.0 (56% predicted) 09/2015 PFT repeat > 86% FVC 2.03L (85% pred), FEV1 1.72L (96% pred), TLC 3.98L (89% pred), DLCO 12.12 (64% pred) November 2017 pulmonary function testing ratio normal, FEV1 1.73 L, 98% predicted, FVC 1.98 L 83% predicted, total lung capacity 3.90 L 87% predicted, DLCO 12.29 65% predicted March 2018 pulmonary function testing ratio normal, FEV1 1.53 L 87% predicted, FVC 1.75 L 74% predicted, total lung capacity 4.0 L 89% predicted, residual volume normal, ERV 0.13 L 29% predicted, DLCO 12.13 64% predicted March 2018 MIP/MEP> expiratory max 28% predicted, and sponge Remax 68% predicted  Heart catheterization: February 2018 by Dr. Rockey Situ. Proximal left anterior descending 50% obstruction, mid RCA 30%, LVEF 55-65%, LVEDP moderately elevated at 28 mmHg, right heart catheterization showed a mean PA pressure of 28, pulmonary capillary wedge pressure 19.  6 minute walk: October 2018 240 m, O2 saturation 91% on room air  Labs: 12/2015 ANA, CCP, RF, SCL-70, Jo-1, SSA, SSB, Aldolase all negative       Assessment & Plan:   ILD (interstitial lung disease) ([place])  Centrilobular emphysema (HCC)  Obstructive sleep apnea  Shortness of breath  Discussion: Since the last visit Susan [name] his symptoms have been about the same. She continues to struggle with shortness of breath in the setting of centrilobular emphysema, interstitial lung disease secondary to recurrent aspiration, and physical deconditioning with obesity. She remains noncompliant with her CPAP machine. I counseled her on the importance of using this today. At this point I have no objective evidence that her interstitial lung disease has worsened, need to get a repeat lung function test to evaluate this further. She had some wheezing on exam today, 7 [place]irc[place] to continue taking Symbicort twice a day she is doing.  Flu shot today  Plan: Obstructive  sleep apnea: Use CPAP!  For the scarring in your lungs, interstitial lung disease from recurrent aspiration: We will re-order a pulmonary function test Continue to follow the speech therapist recommendations  For the emphysema: Continue symbicort 2 puffs  Take ventolin as needed for shortness of breath Flu shot today  For shortness of breath: Exercise regularly  We will see you back in 4 months or sooner if needed   Updated Medication List Outpatient Encounter Prescriptions as of 03/07/2017  Medication Sig  . ibuprofen (ADVIL,MOTRIN) 200 MG tablet Take 600 mg by mouth every 6 (six) hours as needed for headache or moderate pain.   . Melatonin 10 MG TABS Take 15 mg by mouth at bedtime.  . Naproxen Sod-Diphenhydramine (ALEVE PM) 220-25 MG TABS Take 1 tablet by mouth at bedtime.  . potassium chloride (K-DUR) 10 MEQ tablet Take 1 tablet (10 mEq total) by mouth daily as needed.  Marland Kitchen rOPINIRole (REQUIP) 3 MG tablet TAKE 1 TABLET BY MOUTH EVERY EVENING AT BEDTIME  . rosuvastatin (CRESTOR) 5 MG tablet Take 1 tablet (5 mg total) by mouth daily.  . SYMBICORT 160-4.5 MCG/ACT inhaler TAKE 2 PUFFS INTO LUNGS TWICE  A DAY  . traZODone (DESYREL) 50 MG tablet TAKE 1 1/2 TABLETS AT BEDTIME AS NEEDED FOR SLEEP  . valACYclovir (VALTREX) 1000 MG tablet TAKE ONE TABLET BY MOUTH EVERY DAY  . VENTOLIN HFA 108 (90 Base) MCG/ACT inhaler INHALE 2 PUFFS EVERY 6 HOURS AS NEEDED FOR WHEEZING OR SHORTNESS OF BREATH   Facility-Administered Encounter Medications as of 03/07/2017  Medication  . dexamethasone (DECADRON) injection 4 mg  . ondansetron (ZOFRAN) 4 mg in sodium chloride 0.9 % 50 mL IVPB  . ondansetron (ZOFRAN) 4 mg in sodium chloride 0.9 % 50 mL IVPB

## 2017-03-07 NOTE — Patient Instructions (Addendum)
Obstructive sleep apnea: Use CPAP!  For the scarring in your lungs, interstitial lung disease from recurrent aspiration: We will re-order a pulmonary function test Continue to follow the speech therapist recommendations  For the emphysema: Continue symbicort 2 puffs  Take ventolin as needed for shortness of breath Flu shot today  For shortness of breath: Exercise regularly  We will see you back in 4 months or sooner if needed

## 2017-03-07 NOTE — Progress Notes (Signed)
SIX MIN WALK 03/07/2017 11/15/2016 08/03/2016 07/29/2015  Medications no medications taken today - - -  Supplimental Oxygen during Test? (L/min) No No No No  Laps 5 - - -  Partial Lap (in Meters) 0 - - -  Baseline BP (sitting) 130/90 - - -  Baseline Heartrate 92 - - -  Baseline Dyspnea (Borg Scale) 5 - - -  Baseline Fatigue (Borg Scale) 0 - - -  Baseline SPO2 95 - - -  BP (sitting) 142/100 - - -  Heartrate 136 - - -  Dyspnea (Borg Scale) 4 - - -  Fatigue (Borg Scale) 2 - - -  SPO2 99 - - -  BP (sitting) 138/92 - - -  Heartrate 102 - - -  SPO2 100 - - -  Stopped or Paused before Six Minutes Yes - - -  Other Symptoms at end of Exercise pt stopped the walk on the clock at 3:30 due to SOB--checked oxgyen level is was 91% on room air and pulse at 119--pt rested and then completed the test.  - - -  Distance Completed 240 - - -  Tech Comments: pt did have to stop the test 1 time---see note up above---pt did complete the test without further issues.  Pt walked a normal steady pace, she walked with a wheelchair just for stability, she wanted to stop after lap 1 due to dyspnea and wanting to sit down pt walked 3/4 lap, stopping 2X to catch breath, walked a moderate pace while holding on to me for stability, did not complete test d/t dyspnea.  normal pace/SOB//lmr

## 2017-03-17 ENCOUNTER — Ambulatory Visit: Payer: Medicare HMO | Admitting: Family Medicine

## 2017-03-27 ENCOUNTER — Emergency Department: Payer: Medicare HMO

## 2017-03-27 ENCOUNTER — Emergency Department
Admission: EM | Admit: 2017-03-27 | Discharge: 2017-03-27 | Disposition: A | Discharge status: Home / Self Care | Payer: Medicare HMO | Attending: Emergency Medicine | Admitting: Emergency Medicine

## 2017-03-27 ENCOUNTER — Encounter: Payer: Self-pay | Admitting: *Deleted

## 2017-03-27 ENCOUNTER — Other Ambulatory Visit: Payer: Self-pay

## 2017-03-27 ENCOUNTER — Ambulatory Visit: Payer: Self-pay | Admitting: *Deleted

## 2017-03-27 DIAGNOSIS — Z79899 Other long term (current) drug therapy: Secondary | ICD-10-CM | POA: Diagnosis not present

## 2017-03-27 DIAGNOSIS — J849 Interstitial pulmonary disease, unspecified: Secondary | ICD-10-CM | POA: Insufficient documentation

## 2017-03-27 DIAGNOSIS — Z87891 Personal history of nicotine dependence: Secondary | ICD-10-CM | POA: Insufficient documentation

## 2017-03-27 DIAGNOSIS — I2510 Atherosclerotic heart disease of native coronary artery without angina pectoris: Secondary | ICD-10-CM | POA: Insufficient documentation

## 2017-03-27 DIAGNOSIS — J45909 Unspecified asthma, uncomplicated: Secondary | ICD-10-CM | POA: Insufficient documentation

## 2017-03-27 DIAGNOSIS — R062 Wheezing: Secondary | ICD-10-CM | POA: Insufficient documentation

## 2017-03-27 DIAGNOSIS — I509 Heart failure, unspecified: Secondary | ICD-10-CM | POA: Diagnosis not present

## 2017-03-27 DIAGNOSIS — R0602 Shortness of breath: Secondary | ICD-10-CM | POA: Diagnosis not present

## 2017-03-27 LAB — CBC WITH DIFFERENTIAL/PLATELET
Basophils Absolute: 0 10*3/uL (ref 0–0.1)
Basophils Relative: 0 %
EOS ABS: 0.4 10*3/uL (ref 0–0.7)
EOS PCT: 7 %
HCT: 41.9 % (ref 35.0–47.0)
Hemoglobin: 14 g/dL (ref 12.0–16.0)
LYMPHS ABS: 1.1 10*3/uL (ref 1.0–3.6)
Lymphocytes Relative: 20 %
MCH: 30.1 pg (ref 26.0–34.0)
MCHC: 33.3 g/dL (ref 32.0–36.0)
MCV: 90.3 fL (ref 80.0–100.0)
Monocytes Absolute: 0.5 10*3/uL (ref 0.2–0.9)
Monocytes Relative: 9 %
NEUTROS ABS: 3.7 10*3/uL (ref 1.4–6.5)
NEUTROS PCT: 64 %
PLATELETS: 144 10*3/uL — AB (ref 150–440)
RBC: 4.64 MIL/uL (ref 3.80–5.20)
RDW: 15.4 % — ABNORMAL HIGH (ref 11.5–14.5)
WBC: 5.7 10*3/uL (ref 3.6–11.0)

## 2017-03-27 LAB — BASIC METABOLIC PANEL
Anion gap: 10 (ref 5–15)
BUN: 14 mg/dL (ref 6–20)
CALCIUM: 8.7 mg/dL — AB (ref 8.9–10.3)
CO2: 24 mmol/L (ref 22–32)
CREATININE: 0.58 mg/dL (ref 0.44–1.00)
Chloride: 106 mmol/L (ref 101–111)
GFR calc Af Amer: >60 mL/min (ref >60)
Glucose, Bld: 101 mg/dL — ABNORMAL HIGH (ref 65–99)
POTASSIUM: 3.3 mmol/L — AB (ref 3.5–5.1)
SODIUM: 140 mmol/L (ref 135–145)

## 2017-03-27 LAB — TROPONIN I

## 2017-03-27 MED ORDER — IPRATROPIUM-ALBUTEROL 0.5-2.5 (3) MG/3ML IN SOLN
3.0000 mL | Freq: Once | RESPIRATORY_TRACT | Status: AC
  Administered 2017-03-27: 3 mL via RESPIRATORY_TRACT
  Filled 2017-03-27: qty 3

## 2017-03-27 MED ORDER — ALBUTEROL SULFATE HFA 108 (90 BASE) MCG/ACT IN AERS: 2.0000 | INHALATION_SPRAY | Freq: Four times a day (QID) | RESPIRATORY_TRACT | 2 refills | Status: DC | PRN

## 2017-03-27 MED ORDER — METHYLPREDNISOLONE SODIUM SUCC 125 MG IJ SOLR
125.0000 mg | Freq: Once | INTRAMUSCULAR | Status: AC
  Administered 2017-03-27: 125 mg via INTRAVENOUS
  Filled 2017-03-27: qty 2

## 2017-03-27 MED ORDER — PREDNISONE 20 MG PO TABS: 60.0000 mg | ORAL_TABLET | Freq: Every day | ORAL | 0 refills | Status: AC

## 2017-03-27 MED ORDER — ALBUTEROL SULFATE (2.5 MG/3ML) 0.083% IN NEBU
5.0000 mg | INHALATION_SOLUTION | Freq: Once | RESPIRATORY_TRACT | Status: AC
  Administered 2017-03-27: 5 mg via RESPIRATORY_TRACT
  Filled 2017-03-27: qty 6

## 2017-03-27 NOTE — ED Notes (Signed)
Pt has finished breathing tx. Nurse has given pt drink at this time.

## 2017-03-27 NOTE — Telephone Encounter (Signed)
   Reason for Disposition . Difficulty breathing  Answer Assessment - Initial Assessment Questions 1. ONSET: "When did the cough begin?"      Friday 2. SEVERITY: "How bad is the cough today?"      Getting worse over weekend and over night- effecting breathing now 3. RESPIRATORY DISTRESS: "Describe your breathing."      Chest tightness- inhaler helps some 4. FEVER: "Do you have a fever?" If so, ask: "What is your temperature, how was it measured, and when did it start?"     no 5. SPUTUM: "Describe the color of your sputum" (clear, white, yellow, green)     Not coming up- feels it is moving around 6. HEMOPTYSIS: "Are you coughing up any blood?" If so ask: "How much?" (flecks, streaks, tablespoons, etc.)     n/a 7. CARDIAC HISTORY: "Do you have any history of heart disease?" (e.g., heart attack, congestive heart failure)      CHF 8. LUNG HISTORY: "Do you have any history of lung disease?"  (e.g., pulmonary embolus, asthma, emphysema)     Not sure 9. PE RISK FACTORS: "Do you have a history of blood clots?" (or: recent major surgery, recent prolonged travel, bedridden )     no 10. OTHER SYMPTOMS: "Do you have any other symptoms?" (e.g., runny nose, wheezing, chest pain)       Wheezing, chest pain 11. PREGNANCY: "Is there any chance you are pregnant?" "When was your last menstrual period?"       n/a 12. TRAVEL: "Have you traveled out of the country in the last month?" (e.g., travel history, exposures)       no  Protocols used: COUGH - ACUTE NON-PRODUCTIVE-A-AH, COUGH - ACUTE PRODUCTIVE-A-AH  After speaking with patient - her cough is non productive- she is breathing heavily and she has been advised to go to ED. She wants to go to her provider because she does not wants to wait at the Ed- patient advised that she will be triaged at ED and if she is have trouble breathing- she will not be waiting a long time.

## 2017-03-27 NOTE — ED Notes (Signed)
Pt walked and pulse o2 91-93% on RA. Pt seemed to become of SOB but pt and daughter stating that is her baseline

## 2017-03-27 NOTE — ED Provider Notes (Signed)
[facility] Emergency Department Provider Note  ____________________________________________  Time seen: Approximately 11:53 AM  I have reviewed the triage vital signs and the nursing notes.   HISTORY  Chief Complaint Shortness of Breath   HPI Susan [name] is a 76 y.o. female with a history of interstitial lung disease, CAD, CHF with preserved ejection fraction, OSA not using CPAP as prescribed who presents for evaluation of shortness of breath and wheezing.Patient reports progressively worsening shortness of breath since yesterday. Patient is complaining of severe constant shortness of breath, cough that feels productive however she is unable to bring the sputum up. No fever or chills. She has been using her inhalers at home with no significant relief. She called her pulmonologist today who told her to come to the emergency room. She  denies any chest pain.  Past Medical History:  Diagnosis Date  . Acute peptic ulcer, unspecified site, with hemorrhage and perforation, without mention of obstruction   . Arthritis   . Asthma   . Balance problems    unsteady on feet at time  . Chronic airway obstruction, not elsewhere classified    on inhalers  . Diverticulosis of colon (without mention of hemorrhage)   . Hematochezia   . 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 Ventral hernia     Patient Active Problem List   Diagnosis Date Noted  . Aspiration into airway 08/17/2016  . Unstable angina ([place]) 07/08/2016  . Mixed hyperlipidemia   . Aortic atherosclerosis ([place]) 07/06/2016  . Coronary artery disease involving native coronary artery of native heart with unstable angina pectoris ([place]) 07/06/2016  . Klebsiella sepsis ([place]) 04/26/2016  . E coli bacteremia 04/26/2016  . Acute respiratory failure with hypoxia ([place]) 04/18/2016  . Near syncope 10/08/2015  . Transient vision disturbance, right 10/08/2015  . Balance problem 10/08/2015  . ILD  (interstitial lung disease) ([place]) 07/29/2015  . UTI (urinary tract infection) 06/23/2015  . Nasal congestion due to prolonged use of decongestants 12/09/2014  . Chronic insomnia 12/09/2014  . Primary osteoarthritis of left knee 10/03/2014  . Primary osteoarthritis of right knee 08/08/2014  . Elevated blood-pressure reading without diagnosis of hypertension 07/31/2014  . Osteoarthritis of right knee, Severe 10/28/2013  . Glenohumeral arthritis, Severe 08/20/2013  . Centrilobular emphysema ([place]) 03/25/2013  . Solitary pulmonary nodule 02/19/2013  . Shortness of breath 02/12/2013  . Chronic knee pain 01/15/2013  . Obstructive sleep apnea 10/11/2012  . Umbilical hernia 94/85/4627  . Urinary incontinence, urge 01/17/2012  . Diastolic dysfunction 03/50/0938  . Preoperative cardiovascular examination 04/21/2011  . DDD (degenerative disc disease) 10/26/2010  . RESTLESS LEG SYNDROME 02/23/2010  . HIP REPLACEMENT, LEFT, HX OF 12/21/2009  . FOOT PAIN, BILATERAL 03/25/2009  . HYPERCHOLESTEROLEMIA 06/18/2008  . ALLERGIC RHINITIS 05/21/2008  . ANKLE PAIN, BILATERAL 05/21/2008  . DIVERTICULOSIS, COLON 11/26/2007  . 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 APPENDECTOMY    . BUNIONECTOMY Bilateral   . COSMETIC SURGERY  1970's   abdomen. Tummy Tuck  . FOOT SURGERY Bilateral dec 2015 right    left 3 yrs ago  . TOTAL HIP ARTHROPLASTY  2007   left  . TOTAL HIP ARTHROPLASTY     bilateral   . TUBAL LIGATION    . UMBILICAL HERNIA REPAIR     2016    Prior to Admission medications   Medication Sig Start Date End Date Taking? Authorizing  Provider  rOPINIRole (REQUIP) 3 MG tablet TAKE 1 TABLET BY MOUTH EVERY EVENING AT BEDTIME 01/30/17  Yes Bedsole, Amy E, MD  SYMBICORT 160-4.5 MCG/ACT inhaler TAKE 2 PUFFS INTO LUNGS TWICE A DAY 10/11/16  Yes Juanito Doom, MD  traZODone (DESYREL) 50 MG tablet TAKE 1 1/2  TABLETS AT BEDTIME AS NEEDED FOR SLEEP 09/22/16  Yes Bedsole, Amy E, MD  albuterol (PROVENTIL HFA;VENTOLIN HFA) 108 (90 Base) MCG/ACT inhaler Inhale 2 puffs every 6 (six) hours as needed into the lungs for wheezing or shortness of breath. 03/27/17   Rudene Re, MD  ibuprofen (ADVIL,MOTRIN) 200 MG tablet Take 600 mg by mouth every 6 (six) hours as needed for headache or moderate pain.     [provider]  Melatonin 10 MG TABS Take 15 mg by mouth at bedtime.    [provider]  Naproxen Sod-Diphenhydramine (ALEVE PM) 220-25 MG TABS Take 1 tablet by mouth at bedtime.    [provider]  potassium chloride (K-DUR) 10 MEQ tablet Take 1 tablet (10 mEq total) by mouth daily as needed. Patient not taking: Reported on 03/27/2017 08/16/16 03/07/17  Minna Merritts, MD  predniSONE (DELTASONE) 20 MG tablet Take 3 tablets (60 mg total) daily for 4 days by mouth. 03/27/17 03/31/17  Rudene Re, MD  rosuvastatin (CRESTOR) 5 MG tablet Take 1 tablet (5 mg total) by mouth daily. Patient not taking: Reported on 03/27/2017 07/08/16 07/08/17  Minna Merritts, MD  valACYclovir (VALTREX) 1000 MG tablet TAKE ONE TABLET BY MOUTH EVERY DAY Patient not taking: Reported on 03/27/2017 07/22/16   [name], MD  VENTOLIN HFA 108 (90 Base) MCG/ACT inhaler INHALE 2 PUFFS EVERY 6 HOURS AS NEEDED FOR WHEEZING OR SHORTNESS OF BREATH 01/24/17   Juanito Doom, MD    Allergies Patient has no known allergies.  Family History  Problem Relation Age of Onset  . Diabetes Mother   . Coronary artery disease Mother   . Lung disease Mother   . Heart disease Mother        CAD  . Coronary artery disease Father   . Kidney disease Father   . Diabetes Father   . Heart disease Father        CAD  . Cancer Neg Hx     Social History Social History   Tobacco Use  . Smoking status: Former Smoker    Packs/day: 0.50    Years: 40.00    Pack years: 20.00    Types: Cigarettes    Last attempt to  quit: 08/29/2000    Years since quitting: 16.5  . Smokeless tobacco: Never Used  Substance Use Topics  . Alcohol use: No    Alcohol/week: 0.0 oz    Comment: rare  . Drug use: No    Review of Systems  Constitutional: Negative for fever. Eyes: Negative for visual changes. ENT: Negative for sore throat. Neck: No neck pain  Cardiovascular: Negative for chest pain. Respiratory: + shortness of breath, cough, wheezing Gastrointestinal: Negative for abdominal pain, vomiting or diarrhea. Genitourinary: Negative for dysuria. Musculoskeletal: Negative for back pain. Skin: Negative for rash. Neurological: Negative for headaches, weakness or numbness. Psych: No SI or HI  ____________________________________________   PHYSICAL EXAM:  VITAL SIGNS: ED Triage Vitals  Enc Vitals Group     BP 03/27/17 0920 123/71     Pulse Rate 03/27/17 0920 78     Resp 03/27/17 0920 20     Temp 03/27/17 0920 98.7 F (37.1  C)     Temp Source 03/27/17 0920 Oral     SpO2 03/27/17 0921 96 %     Weight 03/27/17 0917 202 lb (91.6 kg)     Height 03/27/17 0917 5' (1.524 m)     Head Circumference --      Peak Flow --      Pain Score --      Pain Loc --      Pain Edu? --      Excl. in [place]? --     Constitutional: Alert and oriented. Well appearing and in no apparent distress. HEENT:      Head: Normocephalic and atraumatic.         Eyes: Conjunctivae are normal. Sclera is non-icteric.       Mouth/Throat: Mucous membranes are moist.       Neck: Supple with no signs of meningismus. Cardiovascular: Regular rate and rhythm. No murmurs, gallops, or rubs. 2+ symmetrical distal pulses are present in all extremities. No JVD. Respiratory:  Increased work of breathing, tachypnea, no hypoxia, severely diminished air movement with diffuse expiratory wheezes throughout.  Gastrointestinal: Soft, non tender, and non distended with positive bowel sounds. No rebound or guarding. Musculoskeletal: Nontender with normal range  of motion in all extremities. No edema, cyanosis, or erythema of extremities. Neurologic: Normal speech and language. Face is symmetric. Moving all extremities. No gross focal neurologic deficits are appreciated. Skin: Skin is warm, dry and intact. No rash noted. Psychiatric: Mood and affect are normal. Speech and behavior are normal.  ____________________________________________   LABS (all labs ordered are listed, but only abnormal results are displayed)  Labs Reviewed  CBC WITH DIFFERENTIAL/PLATELET - Abnormal; Notable for the following components:      Result Value   RDW 15.4 (*)    Platelets 144 (*)    All other components within normal limits  BASIC METABOLIC PANEL - Abnormal; Notable for the following components:   Potassium 3.3 (*)    Glucose, Bld 101 (*)    Calcium 8.7 (*)    All other components within normal limits  TROPONIN I   ____________________________________________  EKG  ED ECG REPORT I, Rudene Re, the attending physician, personally viewed and interpreted this ECG.  Normal sinus rhythm, rate of 76, normal intervals, normal axis, no ST elevations or depressions.  ____________________________________________  RADIOLOGY  CXR:  Chronic bronchitic-reactive airway changes. No acute pneumonia nor pulmonary edema. Stable mild cardiomegaly. ____________________________________________   PROCEDURES  Procedure(s) performed: None Procedures Critical Care performed:  None ____________________________________________   INITIAL IMPRESSION / ASSESSMENT AND PLAN / ED COURSE  76 y.o. female with a history of interstitial lung disease, CAD, CHF with preserved ejection fraction, OSA not using CPAP as prescribed who presents for evaluation of shortness of breath, productive  and wheezing x 2 days. Patient with mild to moderate increased work of breathing, severely diminished air movement with diffuse expiratory wheezes. Patient is afebrile with no new oxygen  requirement. Presentation concerning for interstitial lung disease exacerbation. Chest x-ray with no infiltrate, no fever, no leukocytosis therefore will hold off abx. Will treat with duonebs, solumedrol and reassess    _________________________ 2:36 PM on 03/27/2017 -----------------------------------------  Patient received 3 duonebs and feels markedly improved. Ambulating and maintaining sats. Still has some wheezing but she feels back to baseline and wishes to go home. Will dc home on albuterol and prednisone. Discussed return precautions with patient and close f/u with PCP   As part of my medical decision making,  I reviewed the following data within the [facility] notes reviewed and incorporated, Labs reviewed , EKG interpreted , Old chart reviewed, Radiograph reviewed , Notes from prior ED visits and  Controlled Substance Database    Pertinent labs & imaging results that were available during my care of the patient were reviewed by me and considered in my medical decision making (see chart for details).    ____________________________________________   FINAL CLINICAL IMPRESSION(S) / ED DIAGNOSES  Final diagnoses:  Interstitial lung disease ([place])  Wheezing      NEW MEDICATIONS STARTED DURING THIS VISIT:  This SmartLink is deprecated. Use AVSMEDLIST instead to display the medication list for a patient.   Note:  This document was prepared using Dragon voice recognition software and may include unintentional dictation errors.    Rudene Re, MD 03/27/17 [phone]

## 2017-03-27 NOTE — ED Notes (Signed)
Pt currently in xray, this Probation officer notified xray that pt can go to room 9 after xray completed. Primary RN notified.

## 2017-03-27 NOTE — Discharge Instructions (Signed)

## 2017-03-27 NOTE — ED Triage Notes (Signed)
Pt complains of shortness of breath starting yesterday, pt has audible wheezes and dyspnea with exertion, breathing treatment given in triage

## 2017-03-28 ENCOUNTER — Encounter: Payer: Self-pay | Admitting: Family Medicine

## 2017-03-28 ENCOUNTER — Ambulatory Visit (INDEPENDENT_AMBULATORY_CARE_PROVIDER_SITE_OTHER): Payer: Medicare HMO | Admitting: Family Medicine

## 2017-03-28 VITALS — BP 140/90 | HR 75 | Temp 98.0°F | Ht 60.0 in | Wt 206.2 lb

## 2017-03-28 DIAGNOSIS — T17908A Unspecified foreign body in respiratory tract, part unspecified causing other injury, initial encounter: Secondary | ICD-10-CM | POA: Diagnosis not present

## 2017-03-28 DIAGNOSIS — R69 Illness, unspecified: Secondary | ICD-10-CM | POA: Diagnosis not present

## 2017-03-28 DIAGNOSIS — J432 Centrilobular emphysema: Secondary | ICD-10-CM | POA: Diagnosis not present

## 2017-03-28 DIAGNOSIS — I25110 Atherosclerotic heart disease of native coronary artery with unstable angina pectoris: Secondary | ICD-10-CM

## 2017-03-28 DIAGNOSIS — R4589 Other symptoms and signs involving emotional state: Secondary | ICD-10-CM

## 2017-03-28 NOTE — Patient Instructions (Signed)
Consider Christian counseling.  Keep appointments with pulmonary and cardiology as planned.

## 2017-03-28 NOTE — Progress Notes (Signed)
   Subjective:    Patient ID: Susan [name], female    DOB: Oct 18, 1940, 76 y.o.   MRN: [id number]  HPI  76 year old female presents with recent possible depression. 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  Still staying active and enjoying what she does.  She prayed last night and she feels much better.   OSA treated with CPAP.. having troiuble using this.  She stays sleepy during the day. Se will try to use this more.   She went to ER yesterday for  SOB.  After albuterol treatment.   She may be aspirating.. supposed to be clear throat and chin tucking.  She is feeling much better today.  Blood pressure 140/90, pulse 75, temperature 98 F (36.7 C), temperature source Oral, height 5' (1.524 m), weight 206 lb 4 oz (93.6 kg), SpO2 94 %.   Review of Systems  Constitutional: Negative for fatigue and fever.  HENT: Negative for congestion.   Eyes: Negative for pain.  Respiratory: Positive for shortness of breath.   Cardiovascular: Negative for chest pain, palpitations and leg swelling.  Gastrointestinal: Negative for abdominal pain.  Genitourinary: Negative for dysuria and vaginal bleeding.  Musculoskeletal: Negative for back pain.  Neurological: Negative for syncope, light-headedness and headaches.  Psychiatric/Behavioral: Positive for dysphoric mood.       Objective:   Physical Exam  Constitutional: Vital signs are normal. She appears well-developed and well-nourished. She is cooperative.  Non-toxic appearance. She does not appear ill. No distress.  HENT:  Head: Normocephalic.  Right Ear: Hearing, tympanic membrane, external ear and ear canal normal. Tympanic membrane is not erythematous, not retracted and not bulging.  Left Ear: Hearing, tympanic membrane, external ear and ear canal normal. Tympanic membrane is not erythematous, not retracted and not bulging.  Nose: No mucosal edema or rhinorrhea. Right sinus exhibits no maxillary sinus tenderness and no  frontal sinus tenderness. Left sinus exhibits no maxillary sinus tenderness and no frontal sinus tenderness.  Mouth/Throat: Uvula is midline, oropharynx is clear and moist and mucous membranes are normal.  Eyes: Conjunctivae, EOM and lids are normal. Pupils are equal, round, and reactive to light. Lids are everted and swept, no foreign bodies found.  Neck: Trachea normal and normal range of motion. Neck supple. Carotid bruit is not present. No thyroid mass and no thyromegaly present.  Cardiovascular: Normal rate, regular rhythm, S1 normal, S2 normal, normal heart sounds, intact distal pulses and normal pulses. Exam reveals no gallop and no friction rub.  No murmur heard. Pulmonary/Chest: Effort normal and breath sounds normal. No tachypnea. No respiratory distress. She has no decreased breath sounds. She has no wheezes. She has no rhonchi. She has no rales.  Abdominal: Soft. Normal appearance and bowel sounds are normal. There is no tenderness.  Neurological: She is alert.  Skin: Skin is warm, dry and intact. No rash noted.  Psychiatric: Her speech is normal and behavior is normal. Judgment and thought content normal. Her mood appears not anxious. Cognition and memory are normal. She does not exhibit a depressed mood.          Assessment & Plan:

## 2017-03-28 NOTE — Telephone Encounter (Signed)
Susan [name] was seen in the ER yesterday.  Notes are in Epic.

## 2017-03-28 NOTE — Telephone Encounter (Signed)
Call pt.. Has she gone to ED? If not have her make an appointment ASAP

## 2017-04-12 ENCOUNTER — Other Ambulatory Visit: Payer: Self-pay | Admitting: Family Medicine

## 2017-04-17 ENCOUNTER — Other Ambulatory Visit: Payer: Self-pay | Admitting: Pulmonary Disease

## 2017-04-21 ENCOUNTER — Ambulatory Visit: Payer: Medicare HMO | Admitting: Family Medicine

## 2017-04-24 DIAGNOSIS — R4589 Other symptoms and signs involving emotional state: Secondary | ICD-10-CM | POA: Insufficient documentation

## 2017-04-24 MED ORDER — BUDESONIDE-FORMOTEROL FUMARATE 160-4.5 MCG/ACT IN AERO: INHALATION_SPRAY | RESPIRATORY_TRACT | 3 refills | Status: DC

## 2017-04-24 NOTE — Assessment & Plan Note (Signed)
No new changes. Keep follow up with cardiology as planned.

## 2017-04-24 NOTE — Assessment & Plan Note (Signed)
Encouraged pt to continue chin tuck with eating to avoid aspiration.

## 2017-04-24 NOTE — Assessment & Plan Note (Signed)
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 Encouraged her to consider counseling.  Use melatonin for sleep.

## 2017-04-24 NOTE — Assessment & Plan Note (Addendum)
Refilled inhaler. Pt doing better today after albuterol treatment. Breathing now at baseline.   NO S/S of infection or need for antibiotics.  encouraged pt to keep follow up with Pulmonary.

## 2017-06-02 ENCOUNTER — Ambulatory Visit: Payer: Medicare HMO | Admitting: Family Medicine

## 2017-06-13 IMAGING — RF DG SWALLOWING FUNCTION
17 series · 24 of 24 positions shown · non-contrast
Comparison: None.

CLINICAL DATA: Possible aspiration pneumonia.  Abnormal chest CT.

EXAM:
MODIFIED BARIUM SWALLOW
TECHNIQUE: Different consistencies of barium were administered orally to the
patient by the Speech Pathologist. Imaging of the pharynx was
performed in the lateral projection.
FLUOROSCOPY TIME:  Fluoroscopy Time:  2 minutes and 48 seconds
Radiation Exposure Index (if provided by the fluoroscopic device):
7.70 mGy
Number of Acquired Spot Images: 0

[Series 1: cp_standard · 0.34mm/px · 2 of 45 frames shown (1 of 17)]
[frame 1/45]
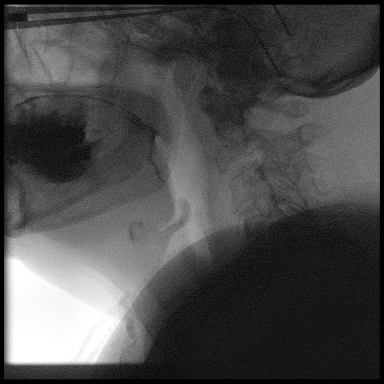
[frame 39/45]
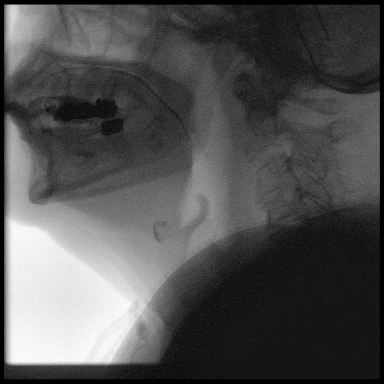

[Series 2: cp_standard · 0.34mm/px · 1 of 62 frames shown (2 of 17)]
[frame 32/62]
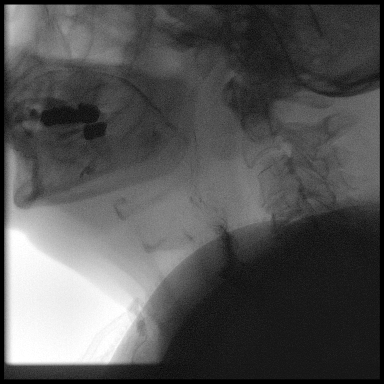

[Series 3: cp_standard · 0.34mm/px · 2 of 140 frames shown (3 of 17)]
[frame 22/140]
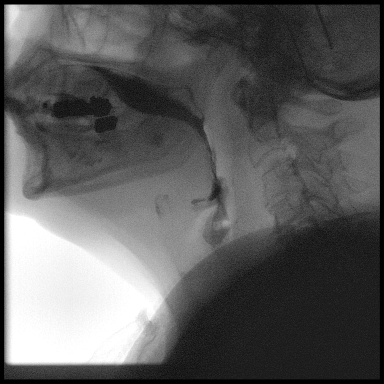
[frame 120/140]
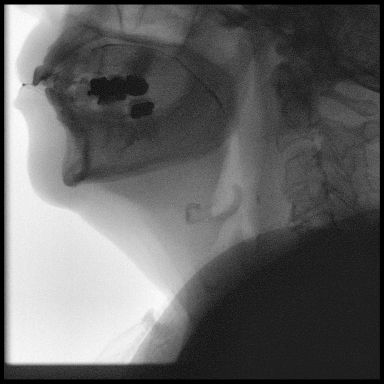

[Series 4: cp_standard · 0.34mm/px · 1 of 69 frames shown (4 of 17)]
[frame 35/69]
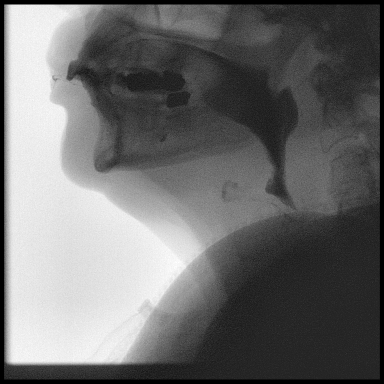

[Series 5: cp_standard · 0.34mm/px · 1 of 64 frames shown (5 of 17)]
[frame 33/64]
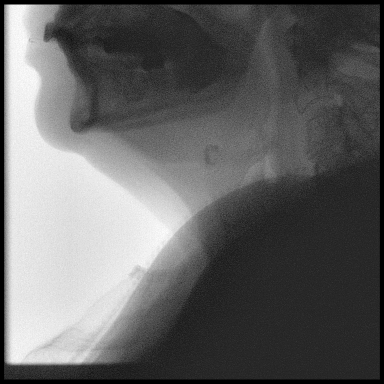

[Series 6: cp_standard · 0.34mm/px · 2 of 35 frames shown (6 of 17)]
[frame 6/35]
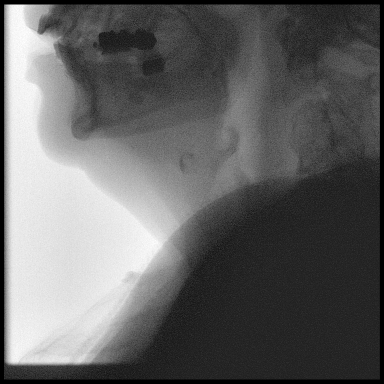
[frame 35/35]
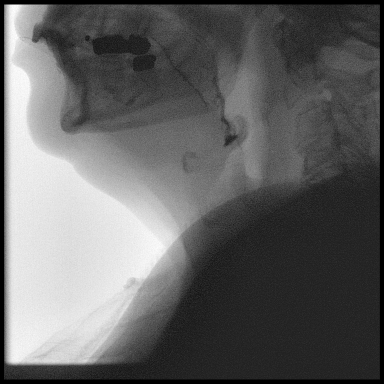

[Series 7: cp_standard · 0.34mm/px · 1 of 142 frames shown (7 of 17)]
[frame 72/142]
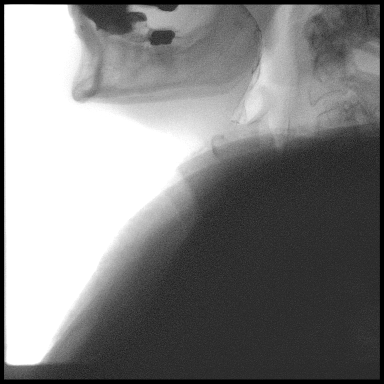

[Series 8: cp_standard · 0.34mm/px · 1 of 101 frames shown (8 of 17)]
[frame 51/101]
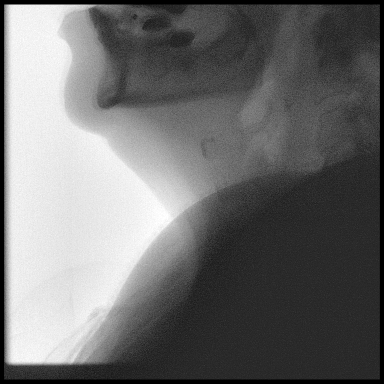

[Series 9: cp_standard · 0.34mm/px · 2 of 111 frames shown (9 of 17)]
[frame 1/111]
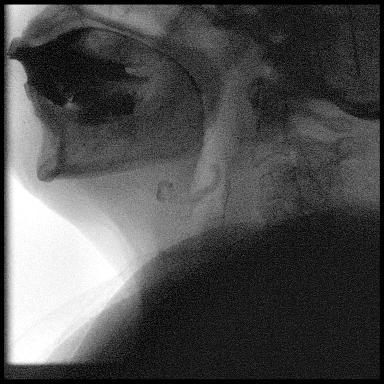
[frame 56/111]
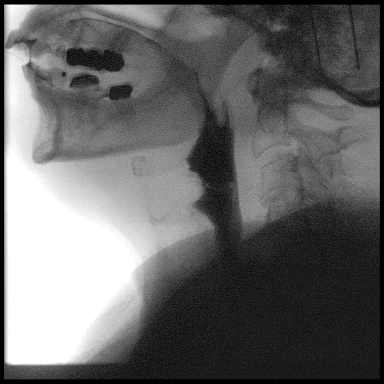

[Series 10: cp_standard · 0.34mm/px · 1 of 120 frames shown (10 of 17)]
[frame 61/120]
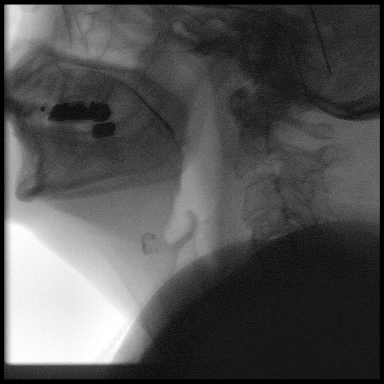

[Series 11: cp_standard · 0.34mm/px · 1 of 117 frames shown (11 of 17)]
[frame 59/117]
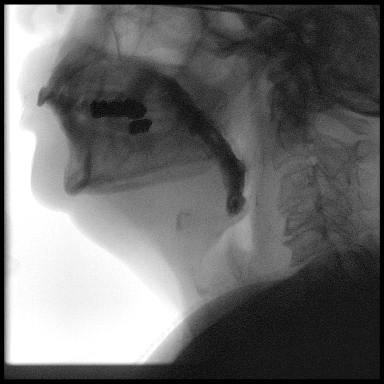

[Series 12: cp_standard · 0.34mm/px · 2 of 225 frames shown (12 of 17)]
[frame 34/225]
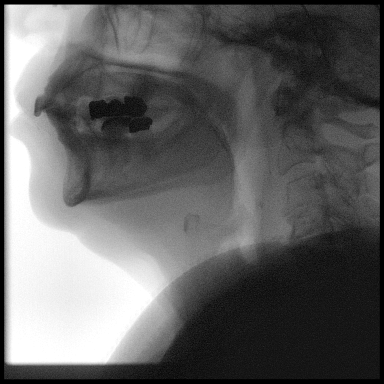
[frame 192/225]
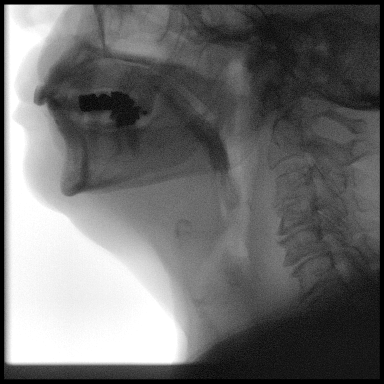

[Series 13: cp_standard · 0.34mm/px · 1 of 124 frames shown (13 of 17)]
[frame 63/124]
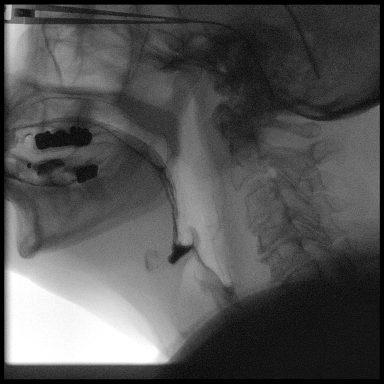

[Series 14: cp_standard · 0.34mm/px · 1 of 170 frames shown (14 of 17)]
[frame 35/170]
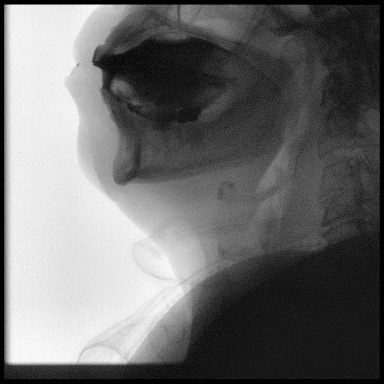

[Series 15: cp_standard · 0.34mm/px · 2 of 140 frames shown (15 of 17)]
[frame 22/140]
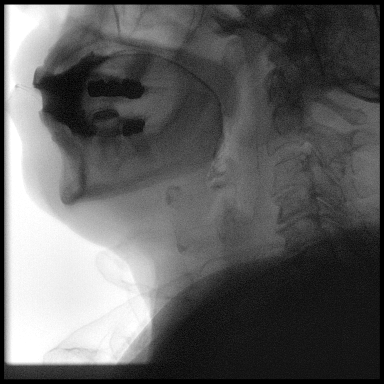
[frame 120/140]
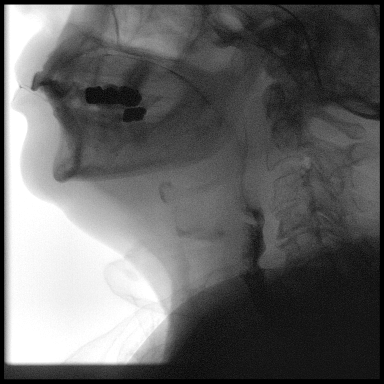

[Series 16: cp_standard · 0.34mm/px · 1 of 113 frames shown (16 of 17)]
[frame 57/113]
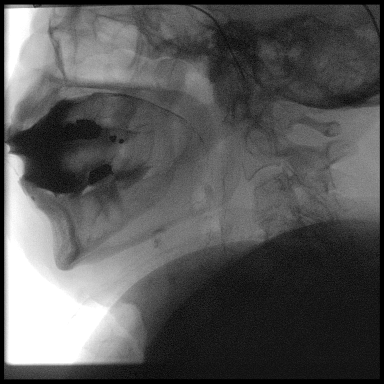

[Series 17: cp_standard · 0.34mm/px · 2 of 77 frames shown (17 of 17)]
[frame 12/77]
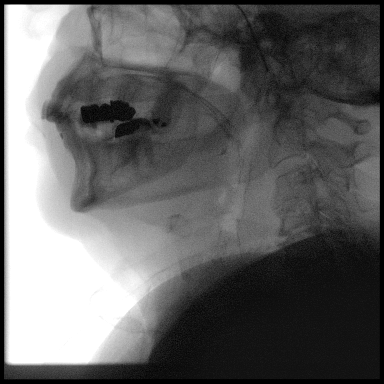
[frame 77/77]
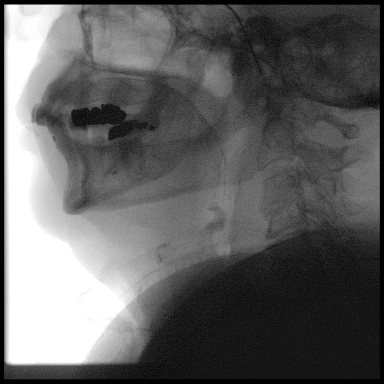

[24 of 24 positions shown; findings below may reference images not displayed]

FINDINGS: Thin liquid- laryngeal penetration occasionally to the cords. No
aspiration. This improved with breath hold maneuver.

Nectar thick liquid- not performed

Honey- not performed

[place] normal

Cracker-not performed

[place] with cracker- normal

Barium tablet -  not performed
IMPRESSION: Laryngeal penetration to the cords with the thin liquid. This
improved with the breath hold maneuver.

Please refer to the Speech Pathologists report for complete details
and recommendations.

## 2017-07-03 ENCOUNTER — Ambulatory Visit: Payer: Medicare HMO | Admitting: Family Medicine

## 2017-07-03 DIAGNOSIS — Z0289 Encounter for other administrative examinations: Secondary | ICD-10-CM

## 2017-07-06 ENCOUNTER — Emergency Department: Payer: Medicare HMO

## 2017-07-06 ENCOUNTER — Encounter: Payer: Self-pay | Admitting: Emergency Medicine

## 2017-07-06 ENCOUNTER — Observation Stay
Admission: EM | Admit: 2017-07-06 | Discharge: 2017-07-07 | Disposition: A | Discharge status: Home / Self Care | Payer: Medicare HMO | Attending: Internal Medicine | Admitting: Internal Medicine

## 2017-07-06 ENCOUNTER — Ambulatory Visit: Payer: Self-pay | Admitting: *Deleted

## 2017-07-06 ENCOUNTER — Other Ambulatory Visit: Payer: Self-pay

## 2017-07-06 ENCOUNTER — Observation Stay: Payer: Medicare HMO

## 2017-07-06 DIAGNOSIS — R778 Other specified abnormalities of plasma proteins: Secondary | ICD-10-CM

## 2017-07-06 DIAGNOSIS — I6782 Cerebral ischemia: Secondary | ICD-10-CM | POA: Diagnosis not present

## 2017-07-06 DIAGNOSIS — R0682 Tachypnea, not elsewhere classified: Secondary | ICD-10-CM | POA: Diagnosis present

## 2017-07-06 DIAGNOSIS — R062 Wheezing: Secondary | ICD-10-CM

## 2017-07-06 DIAGNOSIS — G4733 Obstructive sleep apnea (adult) (pediatric): Secondary | ICD-10-CM | POA: Diagnosis not present

## 2017-07-06 DIAGNOSIS — R42 Dizziness and giddiness: Secondary | ICD-10-CM | POA: Diagnosis not present

## 2017-07-06 DIAGNOSIS — I6523 Occlusion and stenosis of bilateral carotid arteries: Secondary | ICD-10-CM | POA: Diagnosis not present

## 2017-07-06 DIAGNOSIS — Z8719 Personal history of other diseases of the digestive system: Secondary | ICD-10-CM | POA: Insufficient documentation

## 2017-07-06 DIAGNOSIS — Z96643 Presence of artificial hip joint, bilateral: Secondary | ICD-10-CM | POA: Insufficient documentation

## 2017-07-06 DIAGNOSIS — Z79899 Other long term (current) drug therapy: Secondary | ICD-10-CM | POA: Diagnosis not present

## 2017-07-06 DIAGNOSIS — Z87891 Personal history of nicotine dependence: Secondary | ICD-10-CM | POA: Insufficient documentation

## 2017-07-06 DIAGNOSIS — R4701 Aphasia: Principal | ICD-10-CM | POA: Diagnosis present

## 2017-07-06 DIAGNOSIS — R5381 Other malaise: Secondary | ICD-10-CM

## 2017-07-06 DIAGNOSIS — E782 Mixed hyperlipidemia: Secondary | ICD-10-CM | POA: Insufficient documentation

## 2017-07-06 DIAGNOSIS — Z96653 Presence of artificial knee joint, bilateral: Secondary | ICD-10-CM | POA: Diagnosis not present

## 2017-07-06 DIAGNOSIS — I2510 Atherosclerotic heart disease of native coronary artery without angina pectoris: Secondary | ICD-10-CM | POA: Diagnosis not present

## 2017-07-06 DIAGNOSIS — G459 Transient cerebral ischemic attack, unspecified: Secondary | ICD-10-CM | POA: Diagnosis not present

## 2017-07-06 DIAGNOSIS — J45909 Unspecified asthma, uncomplicated: Secondary | ICD-10-CM | POA: Diagnosis not present

## 2017-07-06 DIAGNOSIS — R748 Abnormal levels of other serum enzymes: Secondary | ICD-10-CM | POA: Diagnosis not present

## 2017-07-06 DIAGNOSIS — H538 Other visual disturbances: Secondary | ICD-10-CM | POA: Insufficient documentation

## 2017-07-06 DIAGNOSIS — Z7982 Long term (current) use of aspirin: Secondary | ICD-10-CM | POA: Diagnosis not present

## 2017-07-06 DIAGNOSIS — J984 Other disorders of lung: Secondary | ICD-10-CM | POA: Diagnosis not present

## 2017-07-06 DIAGNOSIS — R7989 Other specified abnormal findings of blood chemistry: Secondary | ICD-10-CM

## 2017-07-06 DIAGNOSIS — G2581 Restless legs syndrome: Secondary | ICD-10-CM | POA: Insufficient documentation

## 2017-07-06 DIAGNOSIS — J849 Interstitial pulmonary disease, unspecified: Secondary | ICD-10-CM | POA: Insufficient documentation

## 2017-07-06 DIAGNOSIS — G4700 Insomnia, unspecified: Secondary | ICD-10-CM | POA: Insufficient documentation

## 2017-07-06 DIAGNOSIS — I639 Cerebral infarction, unspecified: Secondary | ICD-10-CM

## 2017-07-06 DIAGNOSIS — Z8249 Family history of ischemic heart disease and other diseases of the circulatory system: Secondary | ICD-10-CM | POA: Insufficient documentation

## 2017-07-06 DIAGNOSIS — J432 Centrilobular emphysema: Secondary | ICD-10-CM | POA: Insufficient documentation

## 2017-07-06 DIAGNOSIS — H539 Unspecified visual disturbance: Secondary | ICD-10-CM | POA: Diagnosis not present

## 2017-07-06 LAB — COMPREHENSIVE METABOLIC PANEL
ALBUMIN: 3.8 g/dL (ref 3.5–5.0)
ALT: 31 U/L (ref 14–54)
ANION GAP: 9 (ref 5–15)
AST: 33 U/L (ref 15–41)
Alkaline Phosphatase: 82 U/L (ref 38–126)
BILIRUBIN TOTAL: 0.6 mg/dL (ref 0.3–1.2)
BUN: 14 mg/dL (ref 6–20)
CO2: 24 mmol/L (ref 22–32)
Calcium: 8.4 mg/dL — ABNORMAL LOW (ref 8.9–10.3)
Chloride: 108 mmol/L (ref 101–111)
Creatinine, Ser: 0.65 mg/dL (ref 0.44–1.00)
GFR calc Af Amer: >60 mL/min (ref >60)
GFR calc non Af Amer: >60 mL/min (ref >60)
GLUCOSE: 102 mg/dL — AB (ref 65–99)
POTASSIUM: 3.5 mmol/L (ref 3.5–5.1)
Sodium: 141 mmol/L (ref 135–145)
TOTAL PROTEIN: 7.1 g/dL (ref 6.5–8.1)

## 2017-07-06 LAB — URINE DRUG SCREEN, QUALITATIVE (ARMC ONLY)
Amphetamines, Ur Screen: NOT DETECTED
Barbiturates, Ur Screen: NOT DETECTED
Benzodiazepine, Ur Scrn: NOT DETECTED
CANNABINOID 50 NG, UR ~~LOC~~: NOT DETECTED
COCAINE METABOLITE, UR ~~LOC~~: NOT DETECTED
MDMA (Ecstasy)Ur Screen: NOT DETECTED
Methadone Scn, Ur: NOT DETECTED
OPIATE, UR SCREEN: NOT DETECTED
PHENCYCLIDINE (PCP) UR S: NOT DETECTED
Tricyclic, Ur Screen: NOT DETECTED

## 2017-07-06 LAB — CBC WITH DIFFERENTIAL/PLATELET
BASOS ABS: 0 10*3/uL (ref 0–0.1)
Basophils Relative: 0 %
Eosinophils Absolute: 0.1 10*3/uL (ref 0–0.7)
Eosinophils Relative: 1 %
HEMATOCRIT: 41.7 % (ref 35.0–47.0)
HEMOGLOBIN: 13.8 g/dL (ref 12.0–16.0)
LYMPHS PCT: 15 %
Lymphs Abs: 1 10*3/uL (ref 1.0–3.6)
MCH: 30.6 pg (ref 26.0–34.0)
MCHC: 33.2 g/dL (ref 32.0–36.0)
MCV: 92.2 fL (ref 80.0–100.0)
MONO ABS: 0.8 10*3/uL (ref 0.2–0.9)
Monocytes Relative: 12 %
NEUTROS ABS: 4.7 10*3/uL (ref 1.4–6.5)
Neutrophils Relative %: 72 %
Platelets: 160 10*3/uL (ref 150–440)
RBC: 4.52 MIL/uL (ref 3.80–5.20)
RDW: 14.8 % — AB (ref 11.5–14.5)
WBC: 6.6 10*3/uL (ref 3.6–11.0)

## 2017-07-06 LAB — URINALYSIS, ROUTINE W REFLEX MICROSCOPIC
BILIRUBIN URINE: NEGATIVE
Glucose, UA: NEGATIVE mg/dL
Ketones, ur: NEGATIVE mg/dL
Nitrite: POSITIVE — AB
Protein, ur: NEGATIVE mg/dL
Specific Gravity, Urine: 1.018 (ref 1.005–1.030)
pH: 5 (ref 5.0–8.0)

## 2017-07-06 LAB — BRAIN NATRIURETIC PEPTIDE: B NATRIURETIC PEPTIDE 5: 86 pg/mL (ref 0.0–100.0)

## 2017-07-06 LAB — APTT: aPTT: 28 seconds (ref 24–36)

## 2017-07-06 LAB — GLUCOSE, CAPILLARY: GLUCOSE-CAPILLARY: 77 mg/dL (ref 65–99)

## 2017-07-06 LAB — PROTIME-INR
INR: 1.01
PROTHROMBIN TIME: 13.2 s (ref 11.4–15.2)

## 2017-07-06 LAB — ETHANOL: Alcohol, Ethyl (B): <10 mg/dL (ref <10)

## 2017-07-06 LAB — TROPONIN I: Troponin I: 0.05 ng/mL (ref <0.03)

## 2017-07-06 MED ORDER — ASPIRIN 81 MG PO CHEW
324.0000 mg | CHEWABLE_TABLET | Freq: Once | ORAL | Status: AC
  Administered 2017-07-06: 324 mg via ORAL
  Filled 2017-07-06: qty 4

## 2017-07-06 MED ORDER — ALBUTEROL SULFATE (2.5 MG/3ML) 0.083% IN NEBU: 2.5000 mg | INHALATION_SOLUTION | Freq: Four times a day (QID) | RESPIRATORY_TRACT | Status: DC | PRN

## 2017-07-06 MED ORDER — ACETAMINOPHEN 650 MG RE SUPP: 650.0000 mg | Freq: Four times a day (QID) | RECTAL | Status: DC | PRN

## 2017-07-06 MED ORDER — ACETAMINOPHEN 325 MG PO TABS: 650.0000 mg | ORAL_TABLET | Freq: Four times a day (QID) | ORAL | Status: DC | PRN

## 2017-07-06 MED ORDER — MOMETASONE FURO-FORMOTEROL FUM 200-5 MCG/ACT IN AERO
2.0000 | INHALATION_SPRAY | Freq: Two times a day (BID) | RESPIRATORY_TRACT | Status: DC
  Administered 2017-07-06: 2 via RESPIRATORY_TRACT
  Filled 2017-07-06 (×2): qty 8.8

## 2017-07-06 MED ORDER — ENOXAPARIN SODIUM 40 MG/0.4ML ~~LOC~~ SOLN
40.0000 mg | SUBCUTANEOUS | Status: DC
  Administered 2017-07-06: 23:00:00 40 mg via SUBCUTANEOUS
  Filled 2017-07-06: qty 0.4

## 2017-07-06 MED ORDER — IPRATROPIUM-ALBUTEROL 0.5-2.5 (3) MG/3ML IN SOLN
RESPIRATORY_TRACT | Status: AC
  Filled 2017-07-06: qty 3

## 2017-07-06 MED ORDER — ONDANSETRON HCL 4 MG PO TABS: 4.0000 mg | ORAL_TABLET | Freq: Four times a day (QID) | ORAL | Status: DC | PRN

## 2017-07-06 MED ORDER — ASPIRIN EC 81 MG PO TBEC
81.0000 mg | DELAYED_RELEASE_TABLET | Freq: Every day | ORAL | Status: DC
  Administered 2017-07-07: 81 mg via ORAL
  Filled 2017-07-06: qty 1

## 2017-07-06 MED ORDER — ONDANSETRON HCL 4 MG/2ML IJ SOLN: 4.0000 mg | Freq: Four times a day (QID) | INTRAMUSCULAR | Status: DC | PRN

## 2017-07-06 MED ORDER — MELATONIN 5 MG PO TABS
15.0000 mg | ORAL_TABLET | Freq: Every day | ORAL | Status: DC
  Administered 2017-07-06: 15 mg via ORAL
  Filled 2017-07-06 (×2): qty 3

## 2017-07-06 MED ORDER — ROPINIROLE HCL 1 MG PO TABS
3.0000 mg | ORAL_TABLET | Freq: Every day | ORAL | Status: DC
  Administered 2017-07-06: 3 mg via ORAL
  Filled 2017-07-06: qty 3

## 2017-07-06 MED ORDER — IPRATROPIUM-ALBUTEROL 0.5-2.5 (3) MG/3ML IN SOLN
3.0000 mL | Freq: Once | RESPIRATORY_TRACT | Status: AC
  Administered 2017-07-06: 3 mL via RESPIRATORY_TRACT

## 2017-07-06 MED ORDER — TRAZODONE HCL 50 MG PO TABS: 75.0000 mg | ORAL_TABLET | Freq: Every evening | ORAL | Status: DC | PRN

## 2017-07-06 NOTE — H&P (Signed)
[facility] NAME: Susan [name]    MR#:  [id number]  DATE OF BIRTH:  [birth year]  DATE OF ADMISSION:  07/06/2017  PRIMARY CARE PHYSICIAN: [name], MD   REQUESTING/REFERRING PHYSICIAN: Dr. Aundria Rud  CHIEF COMPLAINT:   Chief Complaint  Patient presents with  . Dizziness    HISTORY OF PRESENT ILLNESS:  Susan [name]  is a 77 y.o. female with a known history of asthma, interstitial lung disease, previous history of diverticulosis, osteoarthritis who presents to the hospital due to blurred vision, and just not feeling like herself. Patient says she woke up this morning and had some trouble focusing with her eyes. She says she just did not feel right and therefore came to the ER for further evaluation. Apparently in the emergency room patient had episodes of aphasia where she could not get the right words out. This was witnessed by the ER staff, and has now resolved. The ER physician spoke to neurology who recommended a stroke workup and admission to the hospital. Patient presently denies any headache, nausea, vomiting, focal weakness or numbness on has any other acute symptoms presently.  PAST MEDICAL HISTORY:   Past Medical History:  Diagnosis Date  . Acute peptic ulcer, unspecified site, with hemorrhage and perforation, without mention of obstruction   . Arthritis   . Asthma   . Balance problems    unsteady on feet at time  . Chronic airway obstruction, not elsewhere classified    on inhalers  . Diverticulosis of colon (without mention of hemorrhage)   . Hematochezia   . 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 Ventral hernia     PAST SURGICAL HISTORY:   Past Surgical History:  Procedure Laterality Date  . APPENDECTOMY    . BUNIONECTOMY Bilateral   . COSMETIC SURGERY  1970's   abdomen. Tummy Tuck  . FOOT SURGERY Bilateral dec 2015 right    left 3 yrs ago  . RIGHT/LEFT HEART CATH AND CORONARY  ANGIOGRAPHY Bilateral 07/08/2016   Procedure: Right/Left Heart Cath and Coronary Angiography;  Surgeon: Minna Merritts, MD;  Location: [facility];  Service: Cardiovascular;  Laterality: Bilateral;  . TOTAL HIP ARTHROPLASTY  2007   left  . TOTAL HIP ARTHROPLASTY     bilateral   . TOTAL KNEE ARTHROPLASTY Right 08/08/2014   Procedure: RIGHT TOTAL KNEE ARTHROPLASTY WITH NAVIGATION;  Surgeon: Rod Can, MD;  Location: [facility];  Service: Orthopedics;  Laterality: Right;  . TOTAL KNEE ARTHROPLASTY Left 10/03/2014   Procedure: LEFT TOTAL KNEE ARTHROPLASTY;  Surgeon: Rod Can, MD;  Location: [facility];  Service: Orthopedics;  Laterality: Left;  . TUBAL LIGATION    . UMBILICAL HERNIA REPAIR     2016    SOCIAL HISTORY:   Social History   Tobacco Use  . Smoking status: Former Smoker    Packs/day: 0.50    Years: 40.00    Pack years: 20.00    Types: Cigarettes    Last attempt to quit: 08/29/2000    Years since quitting: 16.8  . Smokeless tobacco: Never Used  Substance Use Topics  . Alcohol use: No    Alcohol/week: 0.0 oz    Comment: rare    FAMILY HISTORY:   Family History  Problem Relation Age of Onset  . Diabetes Mother   . Coronary artery disease Mother   . Lung disease Mother   . Heart disease Mother  CAD  . Coronary artery disease Father   . Kidney disease Father   . Diabetes Father   . Heart disease Father        CAD  . Cancer Neg Hx     DRUG ALLERGIES:  No Known Allergies  REVIEW OF SYSTEMS:   Review of Systems  Constitutional: Negative for fever and weight loss.  HENT: Negative for congestion, nosebleeds and tinnitus.   Eyes: Negative for blurred vision, double vision and redness.  Respiratory: Negative for cough, hemoptysis and shortness of breath.   Cardiovascular: Negative for chest pain, orthopnea, leg swelling and PND.  Gastrointestinal: Negative for abdominal pain, diarrhea, melena, nausea and vomiting.  Genitourinary: Negative for  dysuria, hematuria and urgency.  Musculoskeletal: Negative for falls and joint pain.  Neurological: Positive for speech change. Negative for dizziness, tingling, sensory change, focal weakness, seizures, weakness and headaches.  Endo/Heme/Allergies: Negative for polydipsia. Does not bruise/bleed easily.  Psychiatric/Behavioral: Negative for depression and memory loss. The patient is not nervous/anxious.     MEDICATIONS AT HOME:   Prior to Admission medications   Medication Sig Start Date End Date Taking? Authorizing Provider  albuterol (PROVENTIL HFA;VENTOLIN HFA) 108 (90 Base) MCG/ACT inhaler Inhale 2 puffs every 6 (six) hours as needed into the lungs for wheezing or shortness of breath. 03/27/17  Yes Alfred Levins, [place], MD  budesonide-formoterol [facility]) 160-4.5 MCG/ACT inhaler TAKE 2 PUFFS INTO LUNGS TWICE A DAY Patient taking differently: Inhale 2 puffs into the lungs 2 (two) times daily.  04/24/17  Yes Bedsole, Amy E, MD  ibuprofen (ADVIL,MOTRIN) 200 MG tablet Take 600 mg by mouth every 6 (six) hours as needed for headache or moderate pain.    Yes [provider]  Melatonin 10 MG TABS Take 15 mg by mouth at bedtime.   Yes [provider]  Naproxen Sod-Diphenhydramine (ALEVE PM) 220-25 MG TABS Take 1 tablet by mouth at bedtime.   Yes [provider]  rOPINIRole (REQUIP) 3 MG tablet Take 3 mg by mouth at bedtime.   Yes [provider]  traZODone (DESYREL) 50 MG tablet Take 75 mg by mouth at bedtime as needed for sleep.   Yes [provider]      VITAL SIGNS:  Blood pressure (!) 145/119, pulse 85, temperature 98.9 F (37.2 C), temperature source Oral, resp. rate 20, height 5' (1.524 m), weight 90.7 kg (200 lb), SpO2 99 %.  PHYSICAL EXAMINATION:  Physical Exam  GENERAL:  77 y.o.-year-old patient lying in the bed in no acute distress.  EYES: Pupils equal, round, reactive to light and accommodation. No scleral icterus. Extraocular muscles  intact.  HEENT: Head atraumatic, normocephalic. Oropharynx and nasopharynx clear. No oropharyngeal erythema, moist oral mucosa  NECK:  Supple, no jugular venous distention. No thyroid enlargement, no tenderness.  LUNGS: Normal breath sounds bilaterally, no wheezing, rales, rhonchi. No use of accessory muscles of respiration.  CARDIOVASCULAR: S1, S2 RRR. No murmurs, rubs, gallops, clicks.  ABDOMEN: Soft, nontender, nondistended. Bowel sounds present. No organomegaly or mass.  EXTREMITIES: No pedal edema, cyanosis, or clubbing. + 2 pedal & radial pulses b/l.   NEUROLOGIC: Cranial nerves II through XII are intact. No focal Motor or sensory deficits appreciated b/l.  PSYCHIATRIC: The patient is alert and oriented x 3. Good affect.  SKIN: No obvious rash, lesion, or ulcer.   LABORATORY PANEL:   CBC Recent Labs  Lab 07/06/17 1620  WBC 6.6  HGB 13.8  HCT 41.7  PLT 160   ------------------------------------------------------------------------------------------------------------------  Chemistries  Recent Labs  Lab 07/06/17 1611  NA 141  K 3.5  CL 108  CO2 24  GLUCOSE 102*  BUN 14  CREATININE 0.65  CALCIUM 8.4*  AST 33  ALT 31  ALKPHOS 82  BILITOT 0.6   ------------------------------------------------------------------------------------------------------------------  Cardiac Enzymes Recent Labs  Lab 07/06/17 1611  TROPONINI 0.05*   ------------------------------------------------------------------------------------------------------------------  RADIOLOGY:  Dg Chest 2 View  Result Date: 07/06/2017 CLINICAL DATA:  Dizziness. EXAM: CHEST  2 VIEW COMPARISON:  Radiographs of March 27, 2017 and April 19, 2016. FINDINGS: Stable cardiomegaly. No pneumothorax or pleural effusion is noted. Stable interstitial densities are noted throughout both lungs consistent with scarring, although superimposed acute edema or inflammation cannot be excluded. Right lateral basilar opacity  is noted most consistent with scarring. Multilevel degenerative disc disease is noted in the thoracic spine. IMPRESSION: Stable bilateral lung densities are noted most consistent with scarring. Acute superimposed edema or inflammation cannot be excluded. Electronically Signed   By: Marijo Conception, M.D.   On: 07/06/2017 17:16   Ct Head Wo Contrast  Result Date: 07/06/2017 CLINICAL DATA:  Dizziness EXAM: CT HEAD WITHOUT CONTRAST TECHNIQUE: Contiguous axial images were obtained from the base of the skull through the vertex without intravenous contrast. COMPARISON:  None. FINDINGS: Brain: There is no evidence for acute hemorrhage, hydrocephalus, mass lesion, or abnormal extra-axial fluid collection. No definite CT evidence for acute infarction. Diffuse loss of parenchymal volume is consistent with atrophy. Patchy low attenuation in the deep hemispheric and periventricular white matter is nonspecific, but likely reflects chronic microvascular ischemic demyelination. Vascular: Atherosclerotic calcification of the carotid siphons noted at the skull base. No dense MCA sign. Skull: No evidence for fracture. No worrisome lytic or sclerotic lesion. Sinuses/Orbits: The visualized paranasal sinuses and mastoid air cells are clear. Visualized portions of the globes and intraorbital fat are unremarkable. Other: None. IMPRESSION: 1. No acute intracranial abnormality. 2. Atrophy with chronic small vessel white matter ischemic disease. Electronically Signed   By: Misty Stanley M.D.   On: 07/06/2017 16:31     IMPRESSION AND PLAN:   77 year old female with past medical history of restless leg syndrome, diverticulosis, asthma, interstitial lung disease who presents to the hospital due to blurry vision and also intermittent aphasia.  1. Blurry vision/aphasia-these symptoms have been transient and suspicious for a TIA/CVA. She is presently asymptomatic. She has no other focal neurologic deficits. I suspect this is likely  anxiety mediated. -We'll observe her overnight, CT head is negative for acute pathology. We'll get MRI of the brain, carotid duplex, echo. Continue aspirin. -We'll get a neurology consult.  2. Restless leg syndrome-continue Requip.  3. Asthma/history of interstitial lung disease-no acute exacerbation. Continue Dulera, albuterol inhaler as needed.  4. Insomnia-continue trazodone as needed.      All the records are reviewed and case discussed with ED provider. Management plans discussed with the patient, family and they are in agreement.  CODE STATUS: Full code  TOTAL TIME TAKING CARE OF THIS PATIENT: 40 minutes.    Henreitta Leber M.D on 07/06/2017 at 7:03 PM  Between 7am to 6pm - Pager - [phone]  After 6pm go to [email or web address] - password [email or web address] [facility]  Office  [phone]  CC: Primary care physician; [name], MD

## 2017-07-06 NOTE — ED Notes (Signed)
Pt labored and wheezing increased after standing for vision test/UA.  Dr Mariea Clonts notified.

## 2017-07-06 NOTE — Telephone Encounter (Signed)
Per chart review tab pt is at [facility] ED. 

## 2017-07-06 NOTE — Telephone Encounter (Addendum)
Pt called with complaints of shakiness, dizziness, and weakness; she states that it started about an hour ago; nurse triage initiated and recommendations made per protocol to include going to the ED now; initially she refused and requested appointment with Dr Diona Browner; no appointments available at [facility] or [place], offered the pt the opportunity to be seen in another [facility] office but she declines; she states that she will have her daughter take her to the [facility]; will route to [place] for notification of this encounter and pt triage status.  Reason for Disposition . SEVERE dizziness (e.g., unable to stand, requires support to walk, feels like passing out now)  Answer Assessment - Initial Assessment Questions 1. DESCRIPTION: "Describe your dizziness."     Woozy in the head 2. LIGHTHEADED: "Do you feel lightheaded?" (e.g., somewhat faint, woozy, weak upon standing)     Light headed 3. VERTIGO: "Do you feel like either you or the room is spinning or tilting?" (i.e. vertigo)     no 4. SEVERITY: "How bad is it?"  "Do you feel like you are going to faint?" "Can you stand and walk?"   - MILD - walking normally   - MODERATE - interferes with normal activities (e.g., work, school)    - SEVERE - unable to stand, requires support to walk, feels like passing out now.      moderate 5. ONSET:  "When did the dizziness begin?"     1 hour ago 6. AGGRAVATING FACTORS: "Does anything make it worse?" (e.g., standing, change in head position)    Standing up 7. HEART RATE: "Can you tell me your heart rate?" "How many beats in 15 seconds?"  (Note: not all patients can do this)       no 8. CAUSE: "What do you think is causing the dizziness?"     unsure 9. RECURRENT SYMPTOM: "Have you had dizziness before?" If so, ask: "When was the last time?" "What happened that time?"     Yes , not as bad as this time 10. OTHER SYMPTOMS: "Do you have any other symptoms?" (e.g., fever, chest pain,  vomiting, diarrhea, bleeding)       no 11. PREGNANCY: "Is there any chance you are pregnant?" "When was your last menstrual period?"       no  Protocols used: DIZZINESS [facility]

## 2017-07-06 NOTE — ED Notes (Signed)
Patient transported to CT 

## 2017-07-06 NOTE — ED Triage Notes (Signed)
Here for dizziness that started 1 hr ago with double blurred vision.  Vision has improved but still feels lightheaded. No pain, no other complaints.

## 2017-07-06 NOTE — ED Notes (Signed)
Patient transported to X-ray 

## 2017-07-06 NOTE — ED Provider Notes (Addendum)
[facility] Emergency Department Provider Note  ____________________________________________  Time seen: Approximately 4:11 PM  I have reviewed the triage vital signs and the nursing notes.   HISTORY  Chief Complaint Dizziness    HPI Susan [name] is a 77 y.o. female a history of CAD, chronic airway obstruction and interstitial lung disease, presenting for blurred vision.  The patient is a difficult historian, and is unable to answer questions directly.  Her family is not here yet.  The patient reports that last week, there was a day where she had an "intense" pressure sensation in the entirety of the head that lasted 10-15 seconds and resolved completely and spontaneously.  She has not had any abnormal symptoms until today when she was feeling "blah" when she arrived at work and then while cutting hair, she began to have bilateral blurred vision.  This occurred this morning, she is not sure what time.  Throughout the day, her symptoms have improved, but she states she is now back to baseline although she is unable to say what residual symptoms she is having at this time.  She reports that she told her daughter that she was not feeling right and her daughter said that she needed to come to the emergency department for reevaluation.  She states that she has no medical problems at her medical history in the computer system as long; she states she is on no chronic medications but that her cardiologist gave her a prescription for "something" but is unable to say what for.  When she is talking, she is having intermittent expressive aphasia.  She states that she often "does not talk right," but she is unable to tell me when she started having difficulty with her speech.  Her daughter is on her way and will try to speak with her about the patient's baseline.  Past Medical History:  Diagnosis Date  . Acute peptic ulcer, unspecified site, with hemorrhage and perforation,  without mention of obstruction   . Arthritis   . Asthma   . Balance problems    unsteady on feet at time  . Chronic airway obstruction, not elsewhere classified    on inhalers  . Diverticulosis of colon (without mention of hemorrhage)   . Hematochezia   . 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 Ventral hernia     Patient Active Problem List   Diagnosis Date Noted  . Dysphoric mood 04/24/2017  . Aspiration into airway 08/17/2016  . Unstable angina ([place]) 07/08/2016  . Mixed hyperlipidemia   . Aortic atherosclerosis ([place]) 07/06/2016  . Coronary artery disease involving native coronary artery of native heart with unstable angina pectoris ([place]) 07/06/2016  . Klebsiella sepsis ([place]) 04/26/2016  . E coli bacteremia 04/26/2016  . Acute respiratory failure with hypoxia ([place]) 04/18/2016  . Near syncope 10/08/2015  . Transient vision disturbance, right 10/08/2015  . Balance problem 10/08/2015  . ILD (interstitial lung disease) ([place]) 07/29/2015  . UTI (urinary tract infection) 06/23/2015  . Nasal congestion due to prolonged use of decongestants 12/09/2014  . Chronic insomnia 12/09/2014  . Primary osteoarthritis of left knee 10/03/2014  . Primary osteoarthritis of right knee 08/08/2014  . Elevated blood-pressure reading without diagnosis of hypertension 07/31/2014  . Osteoarthritis of right knee, Severe 10/28/2013  . Glenohumeral arthritis, Severe 08/20/2013  . Centrilobular emphysema ([place]) 03/25/2013  . Solitary pulmonary nodule 02/19/2013  . Shortness of breath 02/12/2013  . Chronic knee pain 01/15/2013  . Obstructive sleep apnea 10/11/2012  .  Umbilical hernia 24/46/2863  . Urinary incontinence, urge 01/17/2012  . Diastolic dysfunction 81/77/1165  . Preoperative cardiovascular examination 04/21/2011  . DDD (degenerative disc disease) 10/26/2010  . RESTLESS LEG SYNDROME 02/23/2010  . HIP REPLACEMENT, LEFT, HX OF 12/21/2009  . FOOT PAIN, BILATERAL 03/25/2009  .  HYPERCHOLESTEROLEMIA 06/18/2008  . ALLERGIC RHINITIS 05/21/2008  . ANKLE PAIN, BILATERAL 05/21/2008  . DIVERTICULOSIS, COLON 11/26/2007  . 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 APPENDECTOMY    . BUNIONECTOMY Bilateral   . COSMETIC SURGERY  1970's   abdomen. Tummy Tuck  . FOOT SURGERY Bilateral dec 2015 right    left 3 yrs ago  . RIGHT/LEFT HEART CATH AND CORONARY ANGIOGRAPHY Bilateral 07/08/2016   Procedure: Right/Left Heart Cath and Coronary Angiography;  Surgeon: Minna Merritts, MD;  Location: [facility];  Service: Cardiovascular;  Laterality: Bilateral;  . TOTAL HIP ARTHROPLASTY  2007   left  . TOTAL HIP ARTHROPLASTY     bilateral   . TOTAL KNEE ARTHROPLASTY Right 08/08/2014   Procedure: RIGHT TOTAL KNEE ARTHROPLASTY WITH NAVIGATION;  Surgeon: Rod Can, MD;  Location: [facility];  Service: Orthopedics;  Laterality: Right;  . TOTAL KNEE ARTHROPLASTY Left 10/03/2014   Procedure: LEFT TOTAL KNEE ARTHROPLASTY;  Surgeon: Rod Can, MD;  Location: [facility];  Service: Orthopedics;  Laterality: Left;  . TUBAL LIGATION    . UMBILICAL HERNIA REPAIR     2016    Current Outpatient Rx  . Order #: [id number] Class: Print  . Order #: [id number] Class: Normal  . Order #: [id number] Class: Historical Med  . Order #: [id number] Class: Historical Med  . Order #: [id number] Class: Historical Med  . Order #: [id number] Class: Normal  . Order #: [id number] Class: Normal  . Order #: [id number] Class: Normal  . Order #: [id number] Class: Normal  . Order #: [id number] Class: Normal  . Order #: [id number] Class: Normal    Allergies Patient has no known allergies.  Family History  Problem Relation Age of Onset  . Diabetes Mother   . Coronary artery disease Mother   . Lung disease Mother   . Heart disease Mother        CAD  . Coronary artery disease Father   . Kidney disease Father   . Diabetes Father   .  Heart disease Father        CAD  . Cancer Neg Hx     Social History Social History   Tobacco Use  . Smoking status: Former Smoker    Packs/day: 0.50    Years: 40.00    Pack years: 20.00    Types: Cigarettes    Last attempt to quit: 08/29/2000    Years since quitting: 16.8  . Smokeless tobacco: Never Used  Substance Use Topics  . Alcohol use: No    Alcohol/week: 0.0 oz    Comment: rare  . Drug use: No    Review of Systems Viability is limited due to patient mental status. Constitutional: No fever/chills.  P.  Positive "I do not feel right." Eyes: Positive bilateral blurred vision, improving at this time.  No diplopia. ENT: No sore throat. No congestion or rhinorrhea. Cardiovascular: Denies chest pain. Denies palpitations. Respiratory: Denies shortness of breath.  No cough. Gastrointestinal: No abdominal pain.  No nausea, no vomiting.  No diarrhea.  No constipation. Genitourinary: Negative for dysuria. Musculoskeletal: Negative for back pain. Skin: Negative for rash. Neurological: Positive for brief headache last week.  Marland Kitchen  No focal numbness, tingling or weakness.  No difficulty walking.  Positive blurred vision.  Positive speech changes.  Positive possible mental status changes.    ____________________________________________   PHYSICAL EXAM:  VITAL SIGNS: ED Triage Vitals [07/06/17 1557]  Enc Vitals Group     BP 131/74     Pulse Rate 83     Resp (!) 26     Temp 98.9 F (37.2 C)     Temp Source Oral     SpO2 95 %     Weight 200 lb (90.7 kg)     Height 5' (1.524 m)     Head Circumference      Peak Flow      Pain Score      Pain Loc      Pain Edu?      Excl. in [place]?     Constitutional: Patient is alert but during my questioning has some expressive aphasia.  Initially, she states twice that it is 1919, but then states it is 2019.  When asked the month, she is able to say is February but states it is the 25th, then says it is the 15th, and the 14th and knows that  it is Valentine's Day.   Eyes: Conjunctivae are normal.  EOMI. PERRLA no scleral icterus. Head: Atraumatic. Nose: No congestion/rhinnorhea. Mouth/Throat: Mucous membranes are moist.  Neck: No stridor.  Supple.  No JVD.  No meningismus. Cardiovascular: Normal rate, regular rhythm. No murmurs, rubs or gallops.  Respiratory: The patient has a tachypnea with mild accessory muscle use no retractions.  She has poor airflow bilaterally without any focal wheezes rales or rhonchi.   Gastrointestinal: Obese.  Soft, nontender and nondistended.  No guarding or rebound.  No peritoneal signs. Musculoskeletal: Symmetric pitting LE edema to the mid tibial shaft. No ttp in the calves or palpable cords.  Negative Homan's sign. Neurologic: Alert oriented as described above.  Speech is occasionally mildly slurred but mostly clear.  The patient's naming and repetition are not intact.  When I asked her to name my watch, she initially states it is o'clock, but then does say it is a watch.  When I asked her to name the band on my watch, she is unable to do so  She is unable to repeat no ifs, ands or buts, and states no if's blands or buts.  Face and smile symmetric. Tongue is midline.  EOMI.  PERRLA.Marland Kitchen No pronator drift. 5 out of 5 grip, biceps, triceps, hip flexors, plantar flexion and dorsiflexion. Normal sensation to light touch in the bilateral upper and lower extremities, and face. Skin:  Skin is warm, dry and intact. No rash noted. Psychiatric: Mood and affect are normal. ____________________________________________   LABS (all labs ordered are listed, but only abnormal results are displayed)  Labs Reviewed  CBC WITH DIFFERENTIAL/PLATELET - Abnormal; Notable for the following components:      Result Value   RDW 14.8 (*)    All other components within normal limits  ETHANOL  PROTIME-INR  APTT  BRAIN NATRIURETIC PEPTIDE  BLOOD GAS, VENOUS  GLUCOSE, CAPILLARY  COMPREHENSIVE METABOLIC PANEL  TROPONIN I  URINE  DRUG SCREEN, QUALITATIVE ([facility] ONLY)  URINALYSIS, ROUTINE W REFLEX MICROSCOPIC  CBG MONITORING, ED   ____________________________________________  EKG  ED ECG REPORT I, Eula Listen, the attending physician, personally viewed and interpreted this ECG.   Date: 07/06/2017  EKG Time: 1642  Rate: 82  Rhythm: normal sinus rhythm  Axis: leftward  Intervals:none  ST&T Change: No STEMI; + PVC  ____________________________________________  RADIOLOGY  Dg Chest 2 View  Result Date: 07/06/2017 CLINICAL DATA:  Dizziness. EXAM: CHEST  2 VIEW COMPARISON:  Radiographs of March 27, 2017 and April 19, 2016. FINDINGS: Stable cardiomegaly. No pneumothorax or pleural effusion is noted. Stable interstitial densities are noted throughout both lungs consistent with scarring, although superimposed acute edema or inflammation cannot be excluded. Right lateral basilar opacity is noted most consistent with scarring. Multilevel degenerative disc disease is noted in the thoracic spine. IMPRESSION: Stable bilateral lung densities are noted most consistent with scarring. Acute superimposed edema or inflammation cannot be excluded. Electronically Signed   By: Marijo Conception, M.D.   On: 07/06/2017 17:16   Ct Head Wo Contrast  Result Date: 07/06/2017 CLINICAL DATA:  Dizziness EXAM: CT HEAD WITHOUT CONTRAST TECHNIQUE: Contiguous axial images were obtained from the base of the skull through the vertex without intravenous contrast. COMPARISON:  None. FINDINGS: Brain: There is no evidence for acute hemorrhage, hydrocephalus, mass lesion, or abnormal extra-axial fluid collection. No definite CT evidence for acute infarction. Diffuse loss of parenchymal volume is consistent with atrophy. Patchy low attenuation in the deep hemispheric and periventricular white matter is nonspecific, but likely reflects chronic microvascular ischemic demyelination. Vascular: Atherosclerotic calcification of the carotid siphons  noted at the skull base. No dense MCA sign. Skull: No evidence for fracture. No worrisome lytic or sclerotic lesion. Sinuses/Orbits: The visualized paranasal sinuses and mastoid air cells are clear. Visualized portions of the globes and intraorbital fat are unremarkable. Other: None. IMPRESSION: 1. No acute intracranial abnormality. 2. Atrophy with chronic small vessel white matter ischemic disease. Electronically Signed   By: Misty Stanley M.D.   On: 07/06/2017 16:31    ____________________________________________   PROCEDURES  Procedure(s) performed: None  Procedures  Critical Care performed: No ____________________________________________   INITIAL IMPRESSION / ASSESSMENT AND PLAN / ED COURSE  Pertinent labs & imaging results that were available during my care of the patient were reviewed by me and considered in my medical decision making (see chart for details).  77 y.o. female presenting for blurred vision, who in fact has multiple other findings including possible confusion, as well as an expressive aphasia.  Overall, the patient is hemodynamically stable.  I am concerned about acute CVA, and the patient does not have insight into her prior medical history so I am unsure about her baseline.  I we have contacted the patient's daughter who will hopefully be able to give [place] more information.  I spoke with Dr. Doy Mince, the neurologist on call, and we will proceed with full stroke workup including CT and if that is negative MRI.  In the meantime, will look for other causes of her symptoms, including urinary tract infection, abnormal electrolytes, or abnormal blood counts.  A bedside glucose level was also ordered.  Plan admission for continued evaluation and treatment.  ----------------------------------------- 5:48 PM on 07/06/2017 -----------------------------------------  The patient's workup in the emergency department has been reassuring so far.  She continues to be hemodynamically  stable.  Her white blood cell count is normal, she is not anemic, her BNP is also normal.  Her gas shows normal pH with a normal CO2 level.  Her CT scan does not show any acute intracranial process.  Her chest x-ray does show small bilateral lung densities that are most consistent with her underlying disease.  I have reaffirmed with the patient that she has not been having any change in her cough, new  shortness of breath, or fevers or chills; it is very unlikely that the patient has a new infection given her lack of symptoms and other objective measures.  The patient does become markedly tachypneic with wheezing when she walks, so we treated her with a DuoNeb; she states that this is typical of her exercise tolerance.  ----------------------------------------- 5:56 PM on 07/06/2017 -----------------------------------------  I have spoken with the hospitalist for the patient's admission.  She does have an elevated troponin of 0.05 which will need to be trended; she does not have any chest pain or other red flag warnings.  An aspirin has been ordered.  ____________________________________________  FINAL CLINICAL IMPRESSION(S) / ED DIAGNOSES  Final diagnoses:  Tachypnea  Wheezing  Blurred vision, bilateral  Malaise  Expressive aphasia         NEW MEDICATIONS STARTED DURING THIS VISIT:  New Prescriptions   No medications on file      Eula Listen, MD 07/06/17 1750    Eula Listen, MD 07/06/17 1756

## 2017-07-07 ENCOUNTER — Observation Stay: Payer: Medicare HMO

## 2017-07-07 ENCOUNTER — Observation Stay (HOSPITAL_BASED_OUTPATIENT_CLINIC_OR_DEPARTMENT_OTHER)
Admit: 2017-07-07 | Discharge: 2017-07-07 | Disposition: A | Discharge status: Home / Self Care | Payer: Medicare HMO | Attending: Specialist | Admitting: Specialist

## 2017-07-07 DIAGNOSIS — J45909 Unspecified asthma, uncomplicated: Secondary | ICD-10-CM | POA: Diagnosis not present

## 2017-07-07 DIAGNOSIS — I25110 Atherosclerotic heart disease of native coronary artery with unstable angina pectoris: Secondary | ICD-10-CM

## 2017-07-07 DIAGNOSIS — G4700 Insomnia, unspecified: Secondary | ICD-10-CM | POA: Diagnosis not present

## 2017-07-07 DIAGNOSIS — H538 Other visual disturbances: Secondary | ICD-10-CM | POA: Diagnosis not present

## 2017-07-07 DIAGNOSIS — R4701 Aphasia: Secondary | ICD-10-CM

## 2017-07-07 DIAGNOSIS — G459 Transient cerebral ischemic attack, unspecified: Secondary | ICD-10-CM | POA: Diagnosis not present

## 2017-07-07 DIAGNOSIS — H539 Unspecified visual disturbance: Secondary | ICD-10-CM | POA: Diagnosis not present

## 2017-07-07 DIAGNOSIS — G2581 Restless legs syndrome: Secondary | ICD-10-CM | POA: Diagnosis not present

## 2017-07-07 LAB — ECHOCARDIOGRAM COMPLETE
HEIGHTINCHES: 60 in
WEIGHTICAEL: 3382.4 [oz_av]

## 2017-07-07 MED ORDER — ASPIRIN EC 325 MG PO TBEC: 325.0000 mg | DELAYED_RELEASE_TABLET | Freq: Every day | ORAL | 0 refills | Status: AC

## 2017-07-07 MED ORDER — ASCRIPTIN 325 MG PO TABS: 1.0000 | ORAL_TABLET | Freq: Every day | ORAL | 0 refills | Status: DC

## 2017-07-07 MED ORDER — ASPIRIN 81 MG PO TBEC: 81.0000 mg | DELAYED_RELEASE_TABLET | Freq: Every day | ORAL | 0 refills | Status: DC

## 2017-07-07 NOTE — Care Management Obs Status (Signed)
[place] NOTIFICATION   Patient Details  Name: Susan [name] MRN: [id number] Date of Birth: 07/27/1940   Medicare Observation Status Notification Given:  No(admitted obs less than 24 hours)    Beverly Sessions, RN 07/07/2017, 2:09 PM

## 2017-07-07 NOTE — Progress Notes (Signed)
*  PRELIMINARY RESULTS* Echocardiogram 2D Echocardiogram has been performed.  Susan [name] 07/07/2017, 10:33 AM

## 2017-07-07 NOTE — Plan of Care (Signed)
  Progressing Education: Knowledge of disease or condition will improve 07/07/2017 0051 - Progressing by Annitta Needs, RN Knowledge of secondary prevention will improve 07/07/2017 0051 - Progressing by Annitta Needs, RN Knowledge of patient specific risk factors addressed and post discharge goals established will improve 07/07/2017 0051 - Progressing by Annitta Needs, RN Coping: Will identify appropriate support needs 07/07/2017 0051 - Progressing by Annitta Needs, RN Health Behavior/Discharge Planning: Ability to manage health-related needs will improve 07/07/2017 0051 - Progressing by Annitta Needs, RN Self-Care: Ability to participate in self-care as condition permits will improve 07/07/2017 0051 - Progressing by Annitta Needs, RN Nutrition: Risk of aspiration will decrease 07/07/2017 0051 - Progressing by Annitta Needs, RN Ischemic Stroke/TIA Tissue Perfusion: Complications of ischemic stroke/TIA will be minimized 07/07/2017 0051 - Progressing by Annitta Needs, RN Health Behavior/Discharge Planning: Ability to manage health-related needs will improve 07/07/2017 0051 - Progressing by Annitta Needs, RN Clinical Measurements: Ability to maintain clinical measurements within normal limits will improve 07/07/2017 0051 - Progressing by Annitta Needs, RN Diagnostic test results will improve 07/07/2017 0051 - Progressing by Annitta Needs, RN Activity: Risk for activity intolerance will decrease 07/07/2017 0051 - Progressing by Annitta Needs, RN Pain Managment: General experience of comfort will improve 07/07/2017 0051 - Progressing by Annitta Needs, RN Safety: Ability to remain free from injury will improve 07/07/2017 0051 - Progressing by Annitta Needs, RN

## 2017-07-07 NOTE — Consult Note (Addendum)
Referring Physician: Manuella Ghazi    Chief Complaint: Blurred vision and dizziness  HPI: Susan [name] is an 77 y.o. female who is a very poor historian.  She reports that at some point in time on yesterday morning she began to have blurred vision.  Her visual disturbances affecting both eyes.  Throughout the day her vision seemed to improve somewhat but she seemed to have difficulty with speech.  She was unable to get sentences out correctly at times.  There was some concern for stroke/TIA and patient was admitted for further evaluation.  Initial NIHSS of 2.    Date last known well: Date: 07/06/2017 Time last known well: Unable to determine tPA Given: No: Unable to determine LKW  Past Medical History:  Diagnosis Date  . Acute peptic ulcer, unspecified site, with hemorrhage and perforation, without mention of obstruction   . Arthritis   . Asthma   . Balance problems    unsteady on feet at time  . Chronic airway obstruction, not elsewhere classified    on inhalers  . Diverticulosis of colon (without mention of hemorrhage)   . Hematochezia   . 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 Ventral hernia     Past Surgical History:  Procedure Laterality Date  . APPENDECTOMY    . BUNIONECTOMY Bilateral   . COSMETIC SURGERY  1970's   abdomen. Tummy Tuck  . FOOT SURGERY Bilateral dec 2015 right    left 3 yrs ago  . RIGHT/LEFT HEART CATH AND CORONARY ANGIOGRAPHY Bilateral 07/08/2016   Procedure: Right/Left Heart Cath and Coronary Angiography;  Surgeon: Minna Merritts, MD;  Location: [facility];  Service: Cardiovascular;  Laterality: Bilateral;  . TOTAL HIP ARTHROPLASTY  2007   left  . TOTAL HIP ARTHROPLASTY     bilateral   . TOTAL KNEE ARTHROPLASTY Right 08/08/2014   Procedure: RIGHT TOTAL KNEE ARTHROPLASTY WITH NAVIGATION;  Surgeon: Rod Can, MD;  Location: [facility];  Service: Orthopedics;  Laterality: Right;  . TOTAL KNEE ARTHROPLASTY Left 10/03/2014   Procedure: LEFT  TOTAL KNEE ARTHROPLASTY;  Surgeon: Rod Can, MD;  Location: [facility];  Service: Orthopedics;  Laterality: Left;  . TUBAL LIGATION    . UMBILICAL HERNIA REPAIR     2016    Family History  Problem Relation Age of Onset  . Diabetes Mother   . Coronary artery disease Mother   . Lung disease Mother   . Heart disease Mother        CAD  . Coronary artery disease Father   . Kidney disease Father   . Diabetes Father   . Heart disease Father        CAD  . Cancer Neg Hx    Social History:  reports that she quit smoking about 16 years ago. Her smoking use included cigarettes. She has a 20.00 pack-year smoking history. she has never used smokeless tobacco. She reports that she does not drink alcohol or use drugs.  Allergies: No Known Allergies  Medications:  I have reviewed the patient's current medications. Prior to Admission:  Medications Prior to Admission  Medication Sig Dispense Refill Last Dose  . albuterol (PROVENTIL HFA;VENTOLIN HFA) 108 (90 Base) MCG/ACT inhaler Inhale 2 puffs every 6 (six) hours as needed into the lungs for wheezing or shortness of breath. 1 Inhaler 2 PRN at PRN  . budesonide-formoterol (SYMBICORT) 160-4.5 MCG/ACT inhaler TAKE 2 PUFFS INTO LUNGS TWICE A DAY (Patient taking differently: Inhale 2 puffs into the lungs  2 (two) times daily. ) 30 g 3 07/06/2017 at am  . ibuprofen (ADVIL,MOTRIN) 200 MG tablet Take 600 mg by mouth every 6 (six) hours as needed for headache or moderate pain.    PRN at PRN  . Melatonin 10 MG TABS Take 15 mg by mouth at bedtime.   07/05/2017 at pm  . Naproxen Sod-Diphenhydramine (ALEVE PM) 220-25 MG TABS Take 1 tablet by mouth at bedtime.   07/05/2017 at pm  . rOPINIRole (REQUIP) 3 MG tablet Take 3 mg by mouth at bedtime.   07/05/2017 at pm  . traZODone (DESYREL) 50 MG tablet Take 75 mg by mouth at bedtime as needed for sleep.   PRN at PRN   Scheduled: . aspirin EC  81 mg Oral Daily  . enoxaparin (LOVENOX) injection  40 mg Subcutaneous Q24H   . Melatonin  15 mg Oral QHS  . mometasone-formoterol  2 puff Inhalation BID  . rOPINIRole  3 mg Oral QHS    ROS: History obtained from the patient  General ROS: negative for - chills, fatigue, fever, night sweats, weight gain or weight loss Psychological ROS: memory difficulties Ophthalmic ROS: as noted in HPI ENT ROS: negative for - epistaxis, nasal discharge, oral lesions, sore throat, tinnitus or vertigo Allergy and Immunology ROS: negative for - hives or itchy/watery eyes Hematological and Lymphatic ROS: negative for - bleeding problems, bruising or swollen lymph nodes Endocrine ROS: negative for - galactorrhea, hair pattern changes, polydipsia/polyuria or temperature intolerance Respiratory ION:[id number] Cardiovascular ROS: negative for - chest pain, dyspnea on exertion, edema or irregular heartbeat Gastrointestinal ROS: negative for - abdominal pain, diarrhea, hematemesis, nausea/vomiting or stool incontinence Genito-Urinary ROS: negative for - dysuria, hematuria, incontinence or urinary frequency/urgency Musculoskeletal ROS: joint pain Neurological ROS: as noted in HPI Dermatological ROS: negative for rash and skin lesion changes  Physical Examination: Blood pressure 100/76, pulse 90, temperature 97.9 F (36.6 C), temperature source Oral, resp. rate 16, height 5' (1.524 m), weight 95.9 kg (211 lb 6.4 oz), SpO2 91 %.  HEENT-  Normocephalic, no lesions, without obvious abnormality.  Normal external eye and conjunctiva.  Normal TM's bilaterally.  Normal auditory canals and external ears. Normal external nose, mucus membranes and septum.  Normal pharynx. Cardiovascular- S1, S2 normal, pulses palpable throughout   Lungs- chest clear, no wheezing, rales, normal symmetric air entry Abdomen- soft, non-tender; bowel sounds normal; no masses,  no organomegaly Extremities- multiple joint deformities related to arthritis Lymph-no adenopathy palpable Musculoskeletal-no joint tenderness,  deformity or swelling Skin-warm and dry, no hyperpigmentation, vitiligo, or suspicious lesions  Neurological Examination   Mental Status: Alert, oriented, less than cooperative, some memory deficits noted.  Speech fluent without evidence of aphasia.  Able to follow 3 step commands without difficulty. Cranial Nerves: II: Discs flat bilaterally; Visual fields grossly normal, pupils equal, round, reactive to light and accommodation III,IV, VI: ptosis not present, extra-ocular motions intact bilaterally V,VII: smile symmetric, facial light touch sensation normal bilaterally VIII: hearing normal bilaterally IX,X: gag reflex present XI: bilateral shoulder shrug XII: midline tongue extension Motor: Right : Upper extremity   5/5    Left:     Upper extremity   5/5  Lower extremity   5/5     Lower extremity   5/5 Tone and bulk:normal tone throughout; no atrophy noted Sensory: Pinprick and light touch intact throughout, bilaterally Deep Tendon Reflexes: 2+ in the upper extremities and absent in the lower extremities Plantars: Right: downgoing   Left: downgoing Cerebellar: Normal finger-to-nose and  normal heel-to-shin testing bilaterally Gait: not tested due to safety concerns    Laboratory Studies:  Basic Metabolic Panel: Recent Labs  Lab 07/06/17 1611  NA 141  K 3.5  CL 108  CO2 24  GLUCOSE 102*  BUN 14  CREATININE 0.65  CALCIUM 8.4*    Liver Function Tests: Recent Labs  Lab 07/06/17 1611  AST 33  ALT 31  ALKPHOS 82  BILITOT 0.6  PROT 7.1  ALBUMIN 3.8   No results for input(s): LIPASE, AMYLASE in the last 168 hours. No results for input(s): AMMONIA in the last 168 hours.  CBC: Recent Labs  Lab 07/06/17 1620  WBC 6.6  NEUTROABS 4.7  HGB 13.8  HCT 41.7  MCV 92.2  PLT 160    Cardiac Enzymes: Recent Labs  Lab 07/06/17 1611  TROPONINI 0.05*    BNP: Invalid input(s): POCBNP  CBG: Recent Labs  Lab 07/06/17 1636  GLUCAP 39    Microbiology: Results  for orders placed or performed during the hospital encounter of 04/17/16  Blood culture (routine x 2)     Status: Abnormal   Collection Time: 04/17/16  9:55 PM  Result Value Ref Range Status   Specimen Description BLOOD RIGHT ANTECUBITAL  Final   Special Requests   Final    BOTTLES DRAWN AEROBIC AND ANAEROBIC [id number]   Culture  Setup Time   Final    GRAM NEGATIVE RODS AEROBIC BOTTLE ONLY CRITICAL VALUE NOTED.  VALUE IS CONSISTENT WITH PREVIOUSLY REPORTED AND CALLED VALUE.    Culture ESCHERICHIA COLI KLEBSIELLA PNEUMONIAE  (A)  Final   Report Status 04/22/2016 FINAL  Final  Blood culture (routine x 2)     Status: Abnormal   Collection Time: 04/17/16  9:55 PM  Result Value Ref Range Status   Specimen Description BLOOD BLOOD LEFT HAND  Final   Special Requests   Final    BOTTLES DRAWN AEROBIC AND ANAEROBIC [id number]   Culture  Setup Time   Final    GRAM NEGATIVE RODS IN BOTH AEROBIC AND ANAEROBIC BOTTLES CRITICAL RESULT CALLED TO, READ BACK BY AND VERIFIED WITH: HANK ZOMPA @ 1245 04/18/2016 QSD Performed at [facility]  (A)  Final   Report Status 04/21/2016 FINAL  Final   Organism ID, Bacteria ESCHERICHIA COLI  Final   Organism ID, Bacteria KLEBSIELLA PNEUMONIAE  Final      Susceptibility   Escherichia coli - MIC*    AMPICILLIN 8 SENSITIVE Sensitive     CEFAZOLIN <=4 SENSITIVE Sensitive     CEFEPIME <=1 SENSITIVE Sensitive     CEFTAZIDIME <=1 SENSITIVE Sensitive     CEFTRIAXONE <=1 SENSITIVE Sensitive     CIPROFLOXACIN <=0.25 SENSITIVE Sensitive     GENTAMICIN <=1 SENSITIVE Sensitive     IMIPENEM <=0.25 SENSITIVE Sensitive     TRIMETH/SULFA <=20 SENSITIVE Sensitive     AMPICILLIN/SULBACTAM 4 SENSITIVE Sensitive     PIP/TAZO <=4 SENSITIVE Sensitive     Extended ESBL NEGATIVE Sensitive     * ESCHERICHIA COLI   Klebsiella pneumoniae - MIC*    AMPICILLIN <=2 RESISTANT Resistant     CEFAZOLIN <=4  SENSITIVE Sensitive     CEFEPIME <=1 SENSITIVE Sensitive     CEFTAZIDIME <=1 SENSITIVE Sensitive     CEFTRIAXONE <=1 SENSITIVE Sensitive     CIPROFLOXACIN <=0.25 SENSITIVE Sensitive     GENTAMICIN <=1 SENSITIVE Sensitive     IMIPENEM <=0.25 SENSITIVE Sensitive  TRIMETH/SULFA <=20 SENSITIVE Sensitive     AMPICILLIN/SULBACTAM <=2 SENSITIVE Sensitive     PIP/TAZO <=4 SENSITIVE Sensitive     Extended ESBL NEGATIVE Sensitive     * KLEBSIELLA PNEUMONIAE  Blood Culture ID Panel (Reflexed)     Status: Abnormal   Collection Time: 04/17/16  9:55 PM  Result Value Ref Range Status   Enterococcus species NOT DETECTED NOT DETECTED Final   Listeria monocytogenes NOT DETECTED NOT DETECTED Final   Staphylococcus species NOT DETECTED NOT DETECTED Final   Staphylococcus aureus NOT DETECTED NOT DETECTED Final   Streptococcus species NOT DETECTED NOT DETECTED Final   Streptococcus agalactiae NOT DETECTED NOT DETECTED Final   Streptococcus pneumoniae NOT DETECTED NOT DETECTED Final   Streptococcus pyogenes NOT DETECTED NOT DETECTED Final   Acinetobacter baumannii NOT DETECTED NOT DETECTED Final   Enterobacteriaceae species DETECTED (A) NOT DETECTED Final    Comment: CRITICAL RESULT CALLED TO, READ BACK BY AND VERIFIED WITH:  HANK ZOMPA @ 1245 04/18/2016 QSD    Enterobacter cloacae complex NOT DETECTED NOT DETECTED Final   Escherichia coli DETECTED (A) NOT DETECTED Final    Comment: CRITICAL RESULT CALLED TO, READ BACK BY AND VERIFIED WITH:  HANK ZOMPA @ 6712 04/18/2016 QSD    Klebsiella oxytoca NOT DETECTED NOT DETECTED Final   Klebsiella pneumoniae DETECTED (A) NOT DETECTED Final    Comment: CRITICAL RESULT CALLED TO, READ BACK BY AND VERIFIED WITH: HANK ZOMPA @ 1245 04/18/2016 QSD    Proteus species NOT DETECTED NOT DETECTED Final   Serratia marcescens NOT DETECTED NOT DETECTED Final   Carbapenem resistance NOT DETECTED NOT DETECTED Final   Haemophilus influenzae NOT DETECTED NOT DETECTED  Final   Neisseria meningitidis NOT DETECTED NOT DETECTED Final   Pseudomonas aeruginosa NOT DETECTED NOT DETECTED Final   Candida albicans NOT DETECTED NOT DETECTED Final   Candida glabrata NOT DETECTED NOT DETECTED Final   Candida krusei NOT DETECTED NOT DETECTED Final   Candida parapsilosis NOT DETECTED NOT DETECTED Final   Candida tropicalis NOT DETECTED NOT DETECTED Final  Urine culture     Status: Abnormal   Collection Time: 04/18/16  2:34 PM  Result Value Ref Range Status   Specimen Description URINE, CATHETERIZED  Final   Special Requests NONE  Final   Culture MULTIPLE SPECIES PRESENT, SUGGEST RECOLLECTION (A)  Final   Report Status 04/20/2016 FINAL  Final    Coagulation Studies: Recent Labs    07/06/17 1611  LABPROT 13.2  INR 1.01    Urinalysis:  Recent Labs  Lab 07/06/17 1611  COLORURINE YELLOW*  LABSPEC 1.018  PHURINE 5.0  GLUCOSEU NEGATIVE  HGBUR SMALL*  BILIRUBINUR NEGATIVE  KETONESUR NEGATIVE  PROTEINUR NEGATIVE  NITRITE POSITIVE*  LEUKOCYTESUR SMALL*    Lipid Panel:    Component Value Date/Time   CHOL 126 04/18/2016 0525   TRIG 40 04/18/2016 0525   HDL 44 04/18/2016 0525   CHOLHDL 2.9 04/18/2016 0525   VLDL 8 04/18/2016 0525   LDLCALC 74 04/18/2016 0525    HgbA1C: No results found for: HGBA1C  Urine Drug Screen:      Component Value Date/Time   LABOPIA NONE DETECTED 07/06/2017 1611   COCAINSCRNUR NONE DETECTED 07/06/2017 1611   LABBENZ NONE DETECTED 07/06/2017 1611   AMPHETMU NONE DETECTED 07/06/2017 1611   THCU NONE DETECTED 07/06/2017 1611   LABBARB NONE DETECTED 07/06/2017 1611    Alcohol Level:  Recent Labs  Lab 07/06/17 1611  ETH <10    Other results: EKG:  sinus rhythm at 82 bpm.  Imaging: Dg Chest 2 View  Result Date: 07/06/2017 CLINICAL DATA:  Dizziness. EXAM: CHEST  2 VIEW COMPARISON:  Radiographs of March 27, 2017 and April 19, 2016. FINDINGS: Stable cardiomegaly. No pneumothorax or pleural effusion is noted.  Stable interstitial densities are noted throughout both lungs consistent with scarring, although superimposed acute edema or inflammation cannot be excluded. Right lateral basilar opacity is noted most consistent with scarring. Multilevel degenerative disc disease is noted in the thoracic spine. IMPRESSION: Stable bilateral lung densities are noted most consistent with scarring. Acute superimposed edema or inflammation cannot be excluded. Electronically Signed   By: Marijo Conception, M.D.   On: 07/06/2017 17:16   Ct Head Wo Contrast  Result Date: 07/06/2017 CLINICAL DATA:  Dizziness EXAM: CT HEAD WITHOUT CONTRAST TECHNIQUE: Contiguous axial images were obtained from the base of the skull through the vertex without intravenous contrast. COMPARISON:  None. FINDINGS: Brain: There is no evidence for acute hemorrhage, hydrocephalus, mass lesion, or abnormal extra-axial fluid collection. No definite CT evidence for acute infarction. Diffuse loss of parenchymal volume is consistent with atrophy. Patchy low attenuation in the deep hemispheric and periventricular white matter is nonspecific, but likely reflects chronic microvascular ischemic demyelination. Vascular: Atherosclerotic calcification of the carotid siphons noted at the skull base. No dense MCA sign. Skull: No evidence for fracture. No worrisome lytic or sclerotic lesion. Sinuses/Orbits: The visualized paranasal sinuses and mastoid air cells are clear. Visualized portions of the globes and intraorbital fat are unremarkable. Other: None. IMPRESSION: 1. No acute intracranial abnormality. 2. Atrophy with chronic small vessel white matter ischemic disease. Electronically Signed   By: Misty Stanley M.D.   On: 07/06/2017 16:31   Mr Brain Wo Contrast  Result Date: 07/07/2017 CLINICAL DATA:  Blurry vision. Feeling unwell. Vision changes, speech difficulties. EXAM: MRI HEAD WITHOUT CONTRAST TECHNIQUE: Multiplanar, multiecho pulse sequences of the brain and  surrounding structures were obtained without intravenous contrast. COMPARISON:  CT HEAD July 06, 2017 FINDINGS: INTRACRANIAL CONTENTS: No reduced diffusion to suggest acute ischemia. Extensive predominately peripherally distributed chronic micro hemorrhages. The ventricles and sulci are normal for patient's age. Patchy to confluent supratentorial white matter FLAIR T2 hyperintensities rim extending to LEFT frontal cortex. Old small RIGHT cerebellar infarct. Old RIGHT basal ganglia lacunar infarct. Prominent basal ganglia perivascular spaces associated with chronic small vessel ischemic disease. No suspicious parenchymal signal, masses, mass effect. No abnormal extra-axial fluid collections. No extra-axial masses. VASCULAR: Normal major intracranial vascular flow voids present at skull base. SKULL AND UPPER CERVICAL SPINE: No abnormal sellar expansion. No suspicious calvarial bone marrow signal. Craniocervical junction maintained. SINUSES/ORBITS: Trace mastoid effusions. Included ocular globes and orbital contents are non-suspicious. OTHER: None. IMPRESSION: 1. No acute intracranial process. 2. 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 3. Moderate to severe chronic small vessel ischemic disease. 4. 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 Electronically Signed   By: Elon Alas M.D.   On: 07/07/2017 00:05   US Carotid Bilateral  Result Date: 07/07/2017 CLINICAL DATA:  Visual disturbance. EXAM: BILATERAL CAROTID DUPLEX ULTRASOUND TECHNIQUE: Pearline Cables scale imaging, color Doppler and duplex ultrasound were performed of bilateral carotid and vertebral arteries in the neck. COMPARISON:  None. FINDINGS: Criteria: Quantification of carotid stenosis is based on velocity parameters that correlate the residual internal carotid diameter with NASCET-based stenosis levels, using the diameter of the distal internal carotid lumen as the denominator for stenosis  measurement. The following velocity measurements were obtained:  RIGHT ICA:  82/26 cm/sec CCA:  42/87 cm/sec SYSTOLIC ICA/CCA RATIO:  1.2 DIASTOLIC ICA/CCA RATIO:  1.8 ECA:  251 cm/sec LEFT ICA:  101/21 cm/sec CCA:  681/15 cm/sec SYSTOLIC ICA/CCA RATIO:  0.9 DIASTOLIC ICA/CCA RATIO:  2.1 ECA:  242 cm/sec RIGHT CAROTID ARTERY: There is a mild amount of partially calcified plaque in the proximal right ICA. Velocities and waveforms are within normal limits and estimated right ICA stenosis is less than 50%. RIGHT VERTEBRAL ARTERY: Antegrade flow with normal waveform and velocity. LEFT CAROTID ARTERY: Mild amount of calcified plaque is present in the proximal left ICA. Velocities and waveforms are unremarkable and estimated left ICA stenosis is less than 50%. LEFT VERTEBRAL ARTERY: Antegrade flow with normal waveform and velocity. IMPRESSION: Mild amount of calcified plaque at the level of both proximal internal carotid arteries. No significant stenosis identified with estimated bilateral ICA stenoses of less than 50%. Electronically Signed   By: Aletta Edouard M.D.   On: 07/07/2017 10:23    Assessment: 77 y.o. female presenting with complaints of dizziness and blurred vision.  Felt to have expressive aphasia on initial evaluation as well.  Symptoms now resolved.  MRI of the brain reviewed and shows no acute changes.  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  This may explain the patient's some memory deficits.  Carotid dopplers show no evidence of hemodynamically significant stenosis.  Echocardiogram shows no cardiac source of emboli with an EF of 50-55%.  Unclear if presentation was actually a TIA.  Stroke Risk Factors - none  Plan: 1. Prophylactic therapy-Antiplatelet med: Aspirin - dose 81mg  daily 2. No further neurologic intervention is recommended at this time.  If further questions arise, please call or page at that time.  Thank you for allowing neurology  to participate in the care of this patient.  Patient to follow with neurology on an outpatient basis.  Case discussed with Dr. Titus Dubin, MD Neurology [phone] 07/07/2017  1:54 PM

## 2017-07-07 NOTE — Progress Notes (Signed)
Discharge instructions given and went over with patient at bedside. Prescriptions reviewed. All questions answered. Patient discharged home. Elzia Hott S, RN  

## 2017-07-07 NOTE — Progress Notes (Signed)
Pt is d/ced home.  All tests for stroke were negative - MRI, U/S of carotids.  Pt had reportedly had slurred speech last night, but this has resolved.  Pt's NIH stroke scale was 0.  She has no visual deficit, no visual deficit, facial symmetry, and good strength peripherally.  Removed IV.  Did encourage pt to read stroke book for good information.  Pt's daughter is going to transport her home.  She will go home on aspirin.

## 2017-07-10 LAB — BLOOD GAS, VENOUS
Acid-Base Excess: 1.1 mmol/L (ref 0.0–2.0)
Bicarbonate: 27.6 mmol/L (ref 20.0–28.0)
PH VEN: 7.35 (ref 7.250–7.430)
Patient temperature: 37
pCO2, Ven: 50 mmHg (ref 44.0–60.0)

## 2017-07-10 NOTE — Discharge Summary (Signed)
[place] [facility] [place] NAME: Susan [name]    MR#:  [id number]  DATE OF BIRTH:  June 19, 1940  DATE OF ADMISSION:  07/06/2017   ADMITTING PHYSICIAN: Henreitta Leber, MD  DATE OF DISCHARGE: 07/07/2017  3:20 PM  PRIMARY CARE PHYSICIAN: [name], MD   ADMISSION DIAGNOSIS:  Tachypnea [R06.82] Wheezing [R06.2] Malaise [R53.81] Elevated troponin [R74.8] Blurred vision, bilateral [H53.8] Expressive aphasia [R47.01] DISCHARGE DIAGNOSIS:  Active Problems:   Aphasia  SECONDARY DIAGNOSIS:   Past Medical History:  Diagnosis Date  . Acute peptic ulcer, unspecified site, with hemorrhage and perforation, without mention of obstruction   . Arthritis   . Asthma   . Balance problems    unsteady on feet at time  . Chronic airway obstruction, not elsewhere classified    on inhalers  . Diverticulosis of colon (without mention of hemorrhage)   . Hematochezia   . 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 Ventral hernia    HOSPITAL COURSE:  77 year old female with past medical history of restless leg syndrome, diverticulosis, asthma, interstitial lung disease admitted due to blurry vision, dizziness and also intermittent aphasia.  1. Blurry vision, dizziness and also intermittent aphasia - Felt to have expressive aphasia on initial evaluation as well.  Symptoms now resolved.  MRI of the brain shows no acute changes.  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  This may explain the patient's some memory deficits.  Carotid dopplers show no evidence of hemodynamically significant stenosis.  Echocardiogram shows no cardiac source of emboli with an EF of 50-55%.  Unclear if presentation was actually a TIA. - Prophylactic therapy-Antiplatelet med: Aspirin - dose 81mg  daily - No further neurologic intervention is recommended at this time.   2. Restless leg syndrome-continue Requip.  3.  Asthma/history of interstitial lung disease-no acute exacerbation. Continue Dulera, albuterol inhaler as needed.  4. Insomnia-continue trazodone as needed.  DISCHARGE CONDITIONS:  stable CONSULTS OBTAINED:  Treatment Team:  Alexis Goodell, MD DRUG ALLERGIES:  No Known Allergies DISCHARGE MEDICATIONS:   Allergies as of 07/07/2017   No Known Allergies     Medication List    TAKE these medications   albuterol 108 (90 Base) MCG/ACT inhaler Commonly known as:  PROVENTIL HFA;VENTOLIN HFA Inhale 2 puffs every 6 (six) hours as needed into the lungs for wheezing or shortness of breath.   ALEVE PM 220-25 MG Tabs Generic drug:  Naproxen Sod-Diphenhydramine Take 1 tablet by mouth at bedtime.   aspirin EC 325 MG tablet Take 1 tablet (325 mg total) by mouth daily.   budesonide-formoterol 160-4.5 MCG/ACT inhaler Commonly known as:  SYMBICORT TAKE 2 PUFFS INTO LUNGS TWICE A DAY What changed:    how much to take  how to take this  when to take this  additional instructions   ibuprofen 200 MG tablet Commonly known as:  ADVIL,MOTRIN Take 600 mg by mouth every 6 (six) hours as needed for headache or moderate pain.   Melatonin 10 MG Tabs Take 15 mg by mouth at bedtime.   rOPINIRole 3 MG tablet Commonly known as:  REQUIP Take 3 mg by mouth at bedtime.   traZODone 50 MG tablet Commonly known as:  DESYREL Take 75 mg by mouth at bedtime as needed for sleep.        DISCHARGE INSTRUCTIONS:   DIET:  Regular diet DISCHARGE CONDITION:  Good ACTIVITY:  Activity as tolerated OXYGEN:  Home Oxygen: No.  Oxygen Delivery: room air DISCHARGE LOCATION:  home   If you experience worsening of your admission symptoms, develop shortness of breath, life threatening emergency, suicidal or homicidal thoughts you must seek medical attention immediately by calling 911 or calling your MD immediately  if symptoms less severe.  You Must read complete instructions/literature along with  all the possible adverse reactions/side effects for all the Medicines you take and that have been prescribed to you. Take any new Medicines after you have completely understood and accpet all the possible adverse reactions/side effects.   Please note  You were cared for by a hospitalist during your hospital stay. If you have any questions about your discharge medications or the care you received while you were in the hospital after you are discharged, you can call the unit and asked to speak with the hospitalist on call if the hospitalist that took care of you is not available. Once you are discharged, your primary care physician will handle any further medical issues. Please note that NO REFILLS for any discharge medications will be authorized once you are discharged, as it is imperative that you return to your primary care physician (or establish a relationship with a primary care physician if you do not have one) for your aftercare needs so that they can reassess your need for medications and monitor your lab values.    On the day of Discharge:  VITAL SIGNS:  Blood pressure 100/76, pulse 90, temperature 97.9 F (36.6 C), temperature source Oral, resp. rate 16, height 5' (1.524 m), weight 95.9 kg (211 lb 6.4 oz), SpO2 91 %. PHYSICAL EXAMINATION:  GENERAL:  77 y.o.-year-old patient lying in the bed with no acute distress.  EYES: Pupils equal, round, reactive to light and accommodation. No scleral icterus. Extraocular muscles intact.  HEENT: Head atraumatic, normocephalic. Oropharynx and nasopharynx clear.  NECK:  Supple, no jugular venous distention. No thyroid enlargement, no tenderness.  LUNGS: Normal breath sounds bilaterally, no wheezing, rales,rhonchi or crepitation. No use of accessory muscles of respiration.  CARDIOVASCULAR: S1, S2 normal. No murmurs, rubs, or gallops.  ABDOMEN: Soft, non-tender, non-distended. Bowel sounds present. No organomegaly or mass.  EXTREMITIES: No pedal edema,  cyanosis, or clubbing.  NEUROLOGIC: Cranial nerves II through XII are intact. Muscle strength 5/5 in all extremities. Sensation intact. Gait not checked.  PSYCHIATRIC: The patient is alert and oriented x 3.  SKIN: No obvious rash, lesion, or ulcer.  DATA REVIEW:   CBC Recent Labs  Lab 07/06/17 1620  WBC 6.6  HGB 13.8  HCT 41.7  PLT 160    Chemistries  Recent Labs  Lab 07/06/17 1611  NA 141  K 3.5  CL 108  CO2 24  GLUCOSE 102*  BUN 14  CREATININE 0.65  CALCIUM 8.4*  AST 33  ALT 31  ALKPHOS 74  BILITOT 0.6    Follow-up Information    [name], MD. Schedule an appointment as soon as possible for a visit on 07/11/2017.   Specialty:  Family Medicine Why:  Please keep your follow up appointment for 10:30 am. Contact information: [place] [address] [phone]        Vladimir Crofts, MD. Schedule an appointment as soon as possible for a visit in 2 week(s).   Specialty:  Neurology Contact information: [facility] [place] [address] [phone]           Management plans discussed with the patient, family and they  are in agreement.  CODE STATUS: Prior   TOTAL TIME TAKING CARE OF THIS PATIENT: 45 minutes.    Max Sane M.D on 07/10/2017 at 8:33 AM  Between 7am to 6pm - Pager - [phone]  After 6pm go to [email or web address] - Proofreader  [facility]  Office  [phone]  CC: Primary care physician; [name], MD   Note: This dictation was prepared with Dragon dictation along with smaller phrase technology. Any transcriptional errors that result from this process are unintentional.

## 2017-07-11 ENCOUNTER — Ambulatory Visit (INDEPENDENT_AMBULATORY_CARE_PROVIDER_SITE_OTHER): Payer: Medicare HMO | Admitting: Family Medicine

## 2017-07-11 ENCOUNTER — Encounter: Payer: Self-pay | Admitting: Family Medicine

## 2017-07-11 VITALS — BP 179/109 | HR 95 | Temp 97.9°F | Ht 60.0 in | Wt 210.0 lb

## 2017-07-11 DIAGNOSIS — R4589 Other symptoms and signs involving emotional state: Secondary | ICD-10-CM | POA: Diagnosis not present

## 2017-07-11 DIAGNOSIS — R8290 Unspecified abnormal findings in urine: Secondary | ICD-10-CM

## 2017-07-11 DIAGNOSIS — R42 Dizziness and giddiness: Secondary | ICD-10-CM | POA: Diagnosis not present

## 2017-07-11 DIAGNOSIS — N3001 Acute cystitis with hematuria: Secondary | ICD-10-CM | POA: Diagnosis not present

## 2017-07-11 DIAGNOSIS — R55 Syncope and collapse: Secondary | ICD-10-CM

## 2017-07-11 DIAGNOSIS — R69 Illness, unspecified: Secondary | ICD-10-CM | POA: Diagnosis not present

## 2017-07-11 LAB — POC URINALSYSI DIPSTICK (AUTOMATED)
Bilirubin, UA: NEGATIVE
GLUCOSE UA: NEGATIVE
KETONES UA: NEGATIVE
Nitrite, UA: POSITIVE
Protein, UA: POSITIVE
Spec Grav, UA: >=1.03 — AB (ref 1.010–1.025)
Urobilinogen, UA: 0.2 E.U./dL
pH, UA: 5 (ref 5.0–8.0)

## 2017-07-11 NOTE — Progress Notes (Signed)
Subjective:    Patient ID: Susan [name], female    DOB: 14-Jun-1940, 77 y.o.   MRN: [id number]  HPI   77 year old female presents for hospital follow up.  Admitted 2/14 to 2/15  For tachypnea, wheeze, malaise, elevated troponin I , blurred vision and expressive aphasia.   Hospital course copied below:  77 year old female with past medical history of restless leg syndrome, diverticulosis, asthma, interstitial lung disease admitted due to blurry vision, dizziness and also intermittent aphasia.  1. Blurry vision, dizziness and also intermittent aphasia -Felt to have expressive aphasia on initial evaluation as well. Symptoms now resolved. MRI of the brain shows no acute changes. 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 This may explain the patient's some memory deficits. Carotid dopplers show no evidence of hemodynamically significant stenosis. Echocardiogram shows no cardiac source of emboli with an EF of 50-55%.Unclear if presentation was actually a TIA. - Prophylactic therapy-Antiplatelet med: Aspirin - dose81mg  daily -No further neurologic intervention is recommended at this time.   2. Restless leg syndrome-continue Requip.  3. Asthma/history of interstitial lung disease-no acute exacerbation. Continue Dulera, albuterol inhaler as needed.  4. Insomnia-continue trazodoneas needed.    She has been scheduled for follow up with neurology in 2 weeks... Next appt 08/08/2017. Today 07/11/17   She reports  Still having intermittent dizziness, woozy, not room spinning.  Noted when she is up walking around.  Some with sitting to standing. trys to get up slowly.  no headache. No chest pain, stable shortness of breath.   ? UA positive in hospital. Repeat today.  no dysuria, no fever.  Blood pressure 124/80, pulse 84, temperature 97.9 F (36.6 C), temperature source Oral, height 5' (1.524 m), weight 210 lb (95.3 kg), SpO2  95 %.   Review of Systems  Constitutional: Negative for fatigue and fever.  HENT: Negative for congestion.   Eyes: Negative for pain.  Respiratory: Negative for cough and shortness of breath.   Cardiovascular: Negative for chest pain, palpitations and leg swelling.  Gastrointestinal: Negative for abdominal pain.  Genitourinary: Negative for dysuria and vaginal bleeding.  Musculoskeletal: Negative for back pain.  Neurological: Positive for dizziness and light-headedness. Negative for syncope and headaches.  Psychiatric/Behavioral: Negative for dysphoric mood.       Objective:   Physical Exam  Constitutional: Vital signs are normal. She appears well-developed and well-nourished. She is cooperative.  Non-toxic appearance. She does not appear ill. No distress.  obese  HENT:  Head: Normocephalic.  Right Ear: Hearing, tympanic membrane, external ear and ear canal normal. Tympanic membrane is not erythematous, not retracted and not bulging.  Left Ear: Hearing, tympanic membrane, external ear and ear canal normal. Tympanic membrane is not erythematous, not retracted and not bulging.  Nose: No mucosal edema or rhinorrhea. Right sinus exhibits no maxillary sinus tenderness and no frontal sinus tenderness. Left sinus exhibits no maxillary sinus tenderness and no frontal sinus tenderness.  Mouth/Throat: Uvula is midline, oropharynx is clear and moist and mucous membranes are normal.  Eyes: Pupils are equal, round, and reactive to light. Conjunctivae, EOM and lids are normal. Lids are everted and swept, no foreign bodies found.  Neck: Trachea normal and normal range of motion. Neck supple. Carotid bruit is not present. No thyroid mass and no thyromegaly present.  Cardiovascular: Normal rate, regular rhythm, S1 normal, S2 normal, normal heart sounds, intact distal pulses and normal pulses. Exam reveals no gallop and no friction rub.  No murmur  heard. Pulmonary/Chest: Effort normal and breath sounds  normal. No tachypnea. No respiratory distress. She has no decreased breath sounds. She has no wheezes. She has no rhonchi. She has no rales.  Abdominal: Soft. Normal appearance and bowel sounds are normal. There is no tenderness.  Musculoskeletal:   Deformities of lower legs from past surgeries and OA  Neurological: She is alert.  Skin: Skin is warm, dry and intact. No rash noted.  Psychiatric: Her speech is normal and behavior is normal. Judgment and thought content normal. Her mood appears not anxious. Cognition and memory are normal. She does not exhibit a depressed mood.          Assessment & Plan:

## 2017-07-11 NOTE — Patient Instructions (Signed)
We will call with urine culture results. Keep appt with neurology.

## 2017-07-14 ENCOUNTER — Telehealth: Payer: Self-pay | Admitting: Family Medicine

## 2017-07-14 ENCOUNTER — Other Ambulatory Visit: Payer: Self-pay | Admitting: Family Medicine

## 2017-07-14 LAB — URINE CULTURE
MICRO NUMBER: 90218493
SPECIMEN QUALITY:: ADEQUATE

## 2017-07-14 MED ORDER — CEPHALEXIN 500 MG PO CAPS: 500.0000 mg | ORAL_CAPSULE | Freq: Three times a day (TID) | ORAL | 0 refills | Status: DC

## 2017-07-14 NOTE — Telephone Encounter (Signed)
Left message for Ms. Overturf that her urine culture was positive and that Dr. Diona Browner sent her in a prescription for Keflex to [facility].

## 2017-07-14 NOTE — Telephone Encounter (Signed)
Culture appears positive... Will treat with antibiotics.Marland Kitchen complete course of cephalexin x 7 days

## 2017-07-14 NOTE — Telephone Encounter (Signed)
Copied from [place]. Topic: Quick Communication - Rx Refill/Question >> Jul 14, 2017  5:31 PM Oliver Pila B wrote: Medication: albuterol (PROVENTIL HFA;VENTOLIN HFA) 108 (90 Base) MCG/ACT inhaler [[id number]] , budesonide-formoterol (SYMBICORT) 160-4.5 MCG/ACT inhaler [[id number]]    Preferred Pharmacy (with phone number or street name): [facility]   Agent: Please be advised that RX refills may take up to 3 business days. We ask that you follow-up with your pharmacy.

## 2017-07-17 MED ORDER — ALBUTEROL SULFATE HFA 108 (90 BASE) MCG/ACT IN AERS: 2.0000 | INHALATION_SPRAY | Freq: Four times a day (QID) | RESPIRATORY_TRACT | 2 refills | Status: DC | PRN

## 2017-07-17 NOTE — Telephone Encounter (Signed)
LOV 07/11/17 Dr. Diona Browner Proventil [facility] Does provider want to refill?

## 2017-08-03 ENCOUNTER — Other Ambulatory Visit: Payer: Self-pay | Admitting: Family Medicine

## 2017-08-03 ENCOUNTER — Ambulatory Visit: Payer: Self-pay | Admitting: *Deleted

## 2017-08-03 NOTE — Telephone Encounter (Signed)
Copied from [place] [id number]. Topic: Quick Communication - Rx Refill/Question >> Aug 03, 2017  4:20 PM Robina Ade, Helene Kelp D wrote: Medication: albuterol (PROVENTIL HFA;VENTOLIN HFA) 108 (90 Base) MCG/ACT inhaler   Has the patient contacted their pharmacy? Yes   (Agent: If no, request that the patient contact the pharmacy for the refill.)   Preferred Pharmacy (with phone number or street name): [place], [place] - [place]: Please be advised that RX refills may take up to 3 business days. We ask that you follow-up with your pharmacy.

## 2017-08-03 NOTE — Telephone Encounter (Signed)
Pt calling from the parking lot of [facility] stating she needs her inhaler filled and that she could not breath. Pt states she is not driving and is sitting in the care with someone. Pt advised that if she was having SOB and feeling like she could not breath she would need to go to the emergency room for treatment. Pt states she is not going to the emergency room and just wants her inhaler. Pt notified that she does have additional refills available at the pharmacy and she should go in to the pharmacy to request refill of the medication. Pt verbalized understanding.  [facility] called and triage nurse spoke with the pharmacist Amy. Amy states that the pt has been in the pharmacy approximately every week asking for inhaler and was concerned that the pt was using the inhaler too often and wanted to make the physician aware. Amy states that the medication was last filled on 3/5 prior to coming in today and pt was in the pharmacy requesting inhaler on 2/22, 2/13 and 1/29.

## 2017-08-04 ENCOUNTER — Telehealth: Payer: Self-pay

## 2017-08-04 NOTE — Telephone Encounter (Signed)
Copied from [place] [id number]. Topic: Quick Communication - Rx Refill/Question >> Aug 03, 2017  4:20 PM Robina Ade, Helene Kelp D wrote: Medication: albuterol (PROVENTIL HFA;VENTOLIN HFA) 108 (90 Base) MCG/ACT inhaler   Has the patient contacted their pharmacy? Yes   (Agent: If no, request that the patient contact the pharmacy for the refill.)   Preferred Pharmacy (with phone number or street name): [place], [place] - [place]: Please be advised that RX refills may take up to 3 business days. We ask that you follow-up with your pharmacy. >> Aug 04, 2017  2:37 PM Margot Ables wrote: Pt returning call. No notes as to who called. Please call back at [phone].

## 2017-08-04 NOTE — Telephone Encounter (Signed)
Pt over using rx .Marland Kitchen Needs to follow up with pulmonary for poor control of  Chronic pulmonary issues and consider follow up appt here for possible treament of anxiety associated with respiratory issues.

## 2017-08-04 NOTE — Telephone Encounter (Signed)
Noted, excessive use of rescue inhaler Will forward to pt's pulmonologist.  Call pt and have her call for follow up with pulmonary.  Also may be having anxiety as can sometimes happen with breathing issues.. Have pt mak appt with me to consider starting a med for anxiety.

## 2017-08-04 NOTE — Telephone Encounter (Signed)
Left detailed message on VM per DPR. 

## 2017-08-04 NOTE — Telephone Encounter (Signed)
See previous TE. Message was left for pt about make OV with pulmonary and Dr Diona Browner.

## 2017-08-04 NOTE — Telephone Encounter (Signed)
Albuterol refill request.  LOV 07/11/17 with Dr. Diona Browner.  Rx pended due to prior triage note from 08/03/17 where the pharmacy was concerned with her requesting refills too often since January.   Concerned that she is not using the inhaler correctly.  See triage note for details.  Does not look like they refilled the inhaler yesterday.  [place], [place] [address] [employer].

## 2017-08-08 DIAGNOSIS — G40219 Localization-related (focal) (partial) symptomatic epilepsy and epileptic syndromes with complex partial seizures, intractable, without status epilepticus: Secondary | ICD-10-CM | POA: Diagnosis not present

## 2017-08-08 DIAGNOSIS — G309 Alzheimer's disease, unspecified: Secondary | ICD-10-CM | POA: Diagnosis not present

## 2017-08-08 DIAGNOSIS — Z6841 Body Mass Index (BMI) 40.0 and over, adult: Secondary | ICD-10-CM | POA: Diagnosis not present

## 2017-08-08 DIAGNOSIS — R69 Illness, unspecified: Secondary | ICD-10-CM | POA: Diagnosis not present

## 2017-08-08 DIAGNOSIS — R9082 White matter disease, unspecified: Secondary | ICD-10-CM | POA: Diagnosis not present

## 2017-08-10 ENCOUNTER — Encounter: Payer: Self-pay | Admitting: Family Medicine

## 2017-08-10 ENCOUNTER — Ambulatory Visit: Payer: Medicare HMO | Admitting: Family Medicine

## 2017-08-10 NOTE — Assessment & Plan Note (Addendum)
Unclear cause.  ? TIA. 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 This may explain the patient's some memory deficits.    Keep follow up with neuro for further eval. Neg orthostatics today.  Increase fluid intake. GIven slightly positive UA at hospital and here.. Send for culture.

## 2017-08-11 ENCOUNTER — Ambulatory Visit (INDEPENDENT_AMBULATORY_CARE_PROVIDER_SITE_OTHER): Payer: Medicare HMO | Admitting: Family Medicine

## 2017-08-11 ENCOUNTER — Ambulatory Visit: Payer: Medicare HMO | Admitting: Family Medicine

## 2017-08-11 ENCOUNTER — Encounter: Payer: Self-pay | Admitting: Family Medicine

## 2017-08-11 VITALS — BP 126/64 | HR 75 | Temp 97.5°F | Ht 60.0 in | Wt 214.2 lb

## 2017-08-11 DIAGNOSIS — J432 Centrilobular emphysema: Secondary | ICD-10-CM

## 2017-08-11 DIAGNOSIS — R062 Wheezing: Secondary | ICD-10-CM | POA: Diagnosis not present

## 2017-08-11 DIAGNOSIS — R4701 Aphasia: Secondary | ICD-10-CM

## 2017-08-11 DIAGNOSIS — J849 Interstitial pulmonary disease, unspecified: Secondary | ICD-10-CM

## 2017-08-11 DIAGNOSIS — F0150 Vascular dementia without behavioral disturbance: Secondary | ICD-10-CM

## 2017-08-11 DIAGNOSIS — R0602 Shortness of breath: Secondary | ICD-10-CM

## 2017-08-11 DIAGNOSIS — F411 Generalized anxiety disorder: Secondary | ICD-10-CM | POA: Insufficient documentation

## 2017-08-11 DIAGNOSIS — R69 Illness, unspecified: Secondary | ICD-10-CM | POA: Diagnosis not present

## 2017-08-11 DIAGNOSIS — R4589 Other symptoms and signs involving emotional state: Secondary | ICD-10-CM

## 2017-08-11 DIAGNOSIS — R0902 Hypoxemia: Secondary | ICD-10-CM

## 2017-08-11 MED ORDER — TIOTROPIUM BROMIDE MONOHYDRATE 18 MCG IN CAPS: 18.0000 ug | ORAL_CAPSULE | Freq: Every day | RESPIRATORY_TRACT | 12 refills | Status: DC

## 2017-08-11 MED ORDER — IPRATROPIUM-ALBUTEROL 0.5-2.5 (3) MG/3ML IN SOLN
3.0000 mL | Freq: Once | RESPIRATORY_TRACT | Status: AC
  Administered 2017-08-11: 3 mL via RESPIRATORY_TRACT

## 2017-08-11 NOTE — Assessment & Plan Note (Signed)
Symptoms seem primarily due to lung disease progression.. No clear new cardiac symptoms. No fluid overload.

## 2017-08-11 NOTE — Assessment & Plan Note (Signed)
Pt with out acute infectious symptoms.. Gradual decline overall .Marland Kitchen Increased wheeze, now hypoxic and qualifies for oxygen with ambulation.  Will continue advair as well as add back spiriva to try to obtain better control to decrease pt use of albuterol rescue.

## 2017-08-11 NOTE — Assessment & Plan Note (Signed)
Currently in moidst of neuro work up with D.r Manuella Ghazi. EEG pending.  Episodes may be due to seizures? Now on keppra ( has not started yet)

## 2017-08-11 NOTE — Patient Instructions (Addendum)
Make sure using CPAP.  Add spiriva.  Use albuterol as needed but do not over use.  Call to set up appt with Dr. Lake Bells for increased Shortness of breath.

## 2017-08-11 NOTE — Assessment & Plan Note (Signed)
Unclear if worsening ILD.Marland Kitchen Refer back to pulmonary for further eval.

## 2017-08-11 NOTE — Progress Notes (Signed)
08/11/2017 Oxygen Saturations Resting and Ambulating/Exertion Resting Pulse Oximetry on 3 Liters Oxygen before Ambulating/Exercise: 97% Test #1: Resting Pulse Oximetry on Room air: 92% Test #2: Ambulating or Exercise Pulse Oximetry on Room air: 82% Test #3: Ambulating or Exercise Pulse Oximetry on 3 Liters Oxygen: 95%

## 2017-08-11 NOTE — Assessment & Plan Note (Addendum)
No clear anxiety and low score on GAD7 as well as PHQ9: 0 Excessive use of albuterol does not seem to be due to anxiety but instead true worsening of breathing. She reports she is not really anxious about her breathing.. Just frustrated it is worsening.

## 2017-08-11 NOTE — Progress Notes (Signed)
Subjective:    Patient ID: Susan [name], female    DOB: 05-10-1941, 76 y.o.   MRN: [id number]  HPI    77 year old female with emphysema,  ILD presents with worsening shrotness of breath. Previous heavy smoker, SHE DENIES ANXIETY.  She is over using albuterol inhalers as she cannot breath. Using mulitple times with  Ambulation as each time she is trying to move. Shortness of breath is mainly with walking short distance.  Stands up.. Uses albuterol... walks to door, uses albuterol.Doristine Church to elevator uses albuterol. She denies she is anxious about her breathing.. Just has severe wheezing and SOB with ambulation.. Gradually worsening over last few months.   No new fever, no cough, no congestion.  No new swelling in ankles, no chest pain.   Last saw Dr. Lake Bells 02/2017  She reports she is using symbicort but is now out. Will refill.  Was doing well on spiriva in past not sure why stopped.  She is over using albuterol.. Using    Recent OV 08/08/2017 with Dr. Manuella Ghazi for dizziness, aphasia spells.  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 Stated low risk  For demyelinating issue despite abnormal MRI brain.  Recommended CPAP.  Felt cognitive impairment ongoing... 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   Has follow up 16month.  Blood pressure 126/64, pulse 75, temperature (!) 97.5 F (36.4 C), temperature source Oral, height 5' (1.524 m), SpO2 92 %.  Review of Systems  Constitutional: Positive for fatigue. Negative for chills and fever.  HENT: Negative for congestion and ear pain.   Eyes: Negative for pain and redness.  Respiratory: Positive for shortness of breath and wheezing. Negative for cough.   Cardiovascular: Negative for chest pain, palpitations and leg swelling.       Objective:   Physical Exam  Constitutional: Vital signs are normal. She  appears well-developed and well-nourished. She is cooperative.  Non-toxic appearance. She does not appear ill. No distress.  overwieght  HENT:  Head: Normocephalic.  Right Ear: Hearing, tympanic membrane, external ear and ear canal normal. Tympanic membrane is not erythematous, not retracted and not bulging.  Left Ear: Hearing, tympanic membrane, external ear and ear canal normal. Tympanic membrane is not erythematous, not retracted and not bulging.  Nose: No mucosal edema or rhinorrhea. Right sinus exhibits no maxillary sinus tenderness and no frontal sinus tenderness. Left sinus exhibits no maxillary sinus tenderness and no frontal sinus tenderness.  Mouth/Throat: Uvula is midline, oropharynx is clear and moist and mucous membranes are normal.  Eyes: Pupils are equal, round, and reactive to light. Conjunctivae, EOM and lids are normal. Lids are everted and swept, no foreign bodies found.  Neck: Trachea normal and normal range of motion. Neck supple. Carotid bruit is not present. No thyroid mass and no thyromegaly present.  Cardiovascular: Normal rate, regular rhythm, S1 normal, S2 normal, normal heart sounds, intact distal pulses and normal pulses. Exam reveals no gallop and no friction rub.  No murmur heard. Pulmonary/Chest: Effort normal. No tachypnea. No respiratory distress. She has no decreased breath sounds. She has wheezes. She has no rhonchi. She has no rales.   Diffuse wheeze throughout lung.. Improved with albuterol atrovent neb treatment but not totally cleared.  Abdominal: Soft. Normal appearance and bowel sounds are normal. There is no tenderness.  Neurological: She is alert.  Skin: Skin is warm, dry and intact.  No rash noted.  Psychiatric: Her speech is normal and behavior is normal. Judgment and thought content normal. Her mood appears not anxious. Cognition and memory are normal. She does not exhibit a depressed mood.          Assessment & Plan:

## 2017-08-11 NOTE — Assessment & Plan Note (Signed)
Given MRI results.. Dr. Manuella Ghazi concerned that dementia may be contributing.

## 2017-08-14 ENCOUNTER — Telehealth: Payer: Self-pay | Admitting: *Deleted

## 2017-08-14 ENCOUNTER — Other Ambulatory Visit: Payer: Self-pay | Admitting: *Deleted

## 2017-08-14 NOTE — Telephone Encounter (Signed)
Received fax from [facility] requesting PA for Spiriva.  PA completed on CoverMyMeds.  Sent for review.  Can take up to 72 hours for a response.

## 2017-08-14 NOTE — Telephone Encounter (Signed)
PA approved effective 05/21/2017 through 05/22/2018.  [facility] pharmacy notified of approval via fax.

## 2017-08-29 ENCOUNTER — Telehealth: Payer: Self-pay | Admitting: *Deleted

## 2017-08-29 DIAGNOSIS — J849 Interstitial pulmonary disease, unspecified: Secondary | ICD-10-CM | POA: Diagnosis not present

## 2017-08-29 DIAGNOSIS — G4733 Obstructive sleep apnea (adult) (pediatric): Secondary | ICD-10-CM | POA: Diagnosis not present

## 2017-08-29 DIAGNOSIS — R0603 Acute respiratory distress: Secondary | ICD-10-CM | POA: Diagnosis not present

## 2017-08-29 DIAGNOSIS — R0602 Shortness of breath: Secondary | ICD-10-CM | POA: Diagnosis not present

## 2017-08-29 DIAGNOSIS — J449 Chronic obstructive pulmonary disease, unspecified: Secondary | ICD-10-CM | POA: Diagnosis not present

## 2017-08-29 DIAGNOSIS — J432 Centrilobular emphysema: Secondary | ICD-10-CM | POA: Diagnosis not present

## 2017-08-29 NOTE — Telephone Encounter (Signed)
Left message for Susan [name] that [facility] has tried to reach her 3 times to collect co-pay and schedule delivery but have not been able to reach her.  They will try one more time but ask that patient try and contact them to arrange payment and schedule delivery time.  Phone number given to [facility] to call at [phone].

## 2017-08-29 NOTE — Telephone Encounter (Signed)
Copied from [place] [id number]. Topic: General - Other >> Aug 29, 2017  9:24 AM Synthia Innocent wrote: 08/29/17.Patient checking status of oxygen order, has not heard from anyone. Please advise

## 2017-08-29 NOTE — Telephone Encounter (Signed)
[name] with [facility] called to give update. They have tried to contact the patient 3 times to collect copay and schedule oxygen but cannot get in touch with the pt. I checked his phone numbers which were correct and he said they would try 1 more time but would like for our office to reach out to pt and let them know they need to call to pay and schedule.

## 2017-08-29 NOTE — Telephone Encounter (Signed)
Left message for [name] with [facility] to please check on the status of [name]'s O2.  Signed order was fax to [facility] on 08/14/2017.  I ask that he call me back on our back line at [phone].  I also refaxed order to [name] in case it was not received on 08/14/2017.

## 2017-08-30 ENCOUNTER — Other Ambulatory Visit: Payer: Self-pay | Admitting: Family Medicine

## 2017-08-31 DIAGNOSIS — G40219 Localization-related (focal) (partial) symptomatic epilepsy and epileptic syndromes with complex partial seizures, intractable, without status epilepticus: Secondary | ICD-10-CM | POA: Diagnosis not present

## 2017-08-31 NOTE — Telephone Encounter (Signed)
Last office visit 08/11/2017.  Last refilled 01/30/2017 with 90 with 1 refill.  Ok to refill?

## 2017-09-05 ENCOUNTER — Other Ambulatory Visit: Payer: Self-pay | Admitting: Family Medicine

## 2017-09-06 ENCOUNTER — Ambulatory Visit: Payer: Medicare HMO | Admitting: Pulmonary Disease

## 2017-09-07 ENCOUNTER — Telehealth: Payer: Self-pay | Admitting: Family Medicine

## 2017-09-07 NOTE — Telephone Encounter (Signed)
Copied from [place]. Topic: Quick Communication - See Telephone Encounter >> Sep 07, 2017  2:25 PM Cleaster Corin, [place] wrote: CRM for notification. See Telephone encounter for: 09/07/17.  Pt. Calling stating that she has received equipment form [facility] but doesn't need it. Pt. Needs a letter faxed to [facility] stating that pt. Doesn't need equipment [phone] Pt. Callback number [phone]  2 Oxygen tanks and Shoulder tank and more and pt. Has a list of things not able to name all

## 2017-09-12 ENCOUNTER — Other Ambulatory Visit: Payer: Self-pay | Admitting: Cardiovascular Disease

## 2017-09-12 NOTE — Telephone Encounter (Signed)
She does not want oxygen? Why?

## 2017-09-12 NOTE — Telephone Encounter (Signed)
Spoke with Susan [name].  She states she does need the oxygen but what they brought out to her is giant.  She is scheduled to go and speak with them on Friday to see about bringing in smaller equipment.  She will call us back if she needs [place] to do anything after her meeting on Friday.

## 2017-09-22 ENCOUNTER — Ambulatory Visit: Payer: Medicare HMO | Admitting: Family Medicine

## 2017-09-28 ENCOUNTER — Other Ambulatory Visit: Payer: Self-pay

## 2017-09-28 ENCOUNTER — Inpatient Hospital Stay
Admission: EM | Admit: 2017-09-28 | Discharge: 2017-10-02 | DRG: 690 | Disposition: A | Discharge status: Home / Self Care | Payer: Medicare HMO | Attending: Internal Medicine | Admitting: Internal Medicine

## 2017-09-28 ENCOUNTER — Emergency Department: Payer: Medicare HMO

## 2017-09-28 DIAGNOSIS — R300 Dysuria: Secondary | ICD-10-CM | POA: Diagnosis not present

## 2017-09-28 DIAGNOSIS — J441 Chronic obstructive pulmonary disease with (acute) exacerbation: Secondary | ICD-10-CM | POA: Diagnosis present

## 2017-09-28 DIAGNOSIS — R0602 Shortness of breath: Secondary | ICD-10-CM | POA: Diagnosis not present

## 2017-09-28 DIAGNOSIS — R42 Dizziness and giddiness: Secondary | ICD-10-CM | POA: Diagnosis not present

## 2017-09-28 DIAGNOSIS — R05 Cough: Secondary | ICD-10-CM | POA: Diagnosis not present

## 2017-09-28 DIAGNOSIS — G40909 Epilepsy, unspecified, not intractable, without status epilepticus: Secondary | ICD-10-CM | POA: Diagnosis present

## 2017-09-28 DIAGNOSIS — R0902 Hypoxemia: Secondary | ICD-10-CM | POA: Diagnosis present

## 2017-09-28 DIAGNOSIS — J849 Interstitial pulmonary disease, unspecified: Secondary | ICD-10-CM | POA: Diagnosis present

## 2017-09-28 DIAGNOSIS — Z791 Long term (current) use of non-steroidal anti-inflammatories (NSAID): Secondary | ICD-10-CM

## 2017-09-28 DIAGNOSIS — R0989 Other specified symptoms and signs involving the circulatory and respiratory systems: Secondary | ICD-10-CM

## 2017-09-28 DIAGNOSIS — B9620 Unspecified Escherichia coli [E. coli] as the cause of diseases classified elsewhere: Secondary | ICD-10-CM | POA: Diagnosis present

## 2017-09-28 DIAGNOSIS — J449 Chronic obstructive pulmonary disease, unspecified: Secondary | ICD-10-CM | POA: Diagnosis not present

## 2017-09-28 DIAGNOSIS — J40 Bronchitis, not specified as acute or chronic: Secondary | ICD-10-CM

## 2017-09-28 DIAGNOSIS — J440 Chronic obstructive pulmonary disease with acute lower respiratory infection: Secondary | ICD-10-CM | POA: Diagnosis present

## 2017-09-28 DIAGNOSIS — R0603 Acute respiratory distress: Secondary | ICD-10-CM | POA: Diagnosis not present

## 2017-09-28 DIAGNOSIS — Z87891 Personal history of nicotine dependence: Secondary | ICD-10-CM

## 2017-09-28 DIAGNOSIS — I6523 Occlusion and stenosis of bilateral carotid arteries: Secondary | ICD-10-CM | POA: Diagnosis not present

## 2017-09-28 DIAGNOSIS — E876 Hypokalemia: Secondary | ICD-10-CM | POA: Diagnosis present

## 2017-09-28 DIAGNOSIS — B961 Klebsiella pneumoniae [K. pneumoniae] as the cause of diseases classified elsewhere: Secondary | ICD-10-CM | POA: Diagnosis present

## 2017-09-28 DIAGNOSIS — J209 Acute bronchitis, unspecified: Secondary | ICD-10-CM | POA: Diagnosis present

## 2017-09-28 DIAGNOSIS — Z8711 Personal history of peptic ulcer disease: Secondary | ICD-10-CM

## 2017-09-28 DIAGNOSIS — Z96653 Presence of artificial knee joint, bilateral: Secondary | ICD-10-CM | POA: Diagnosis present

## 2017-09-28 DIAGNOSIS — Z66 Do not resuscitate: Secondary | ICD-10-CM | POA: Diagnosis present

## 2017-09-28 DIAGNOSIS — G4733 Obstructive sleep apnea (adult) (pediatric): Secondary | ICD-10-CM | POA: Diagnosis not present

## 2017-09-28 DIAGNOSIS — G2581 Restless legs syndrome: Secondary | ICD-10-CM | POA: Diagnosis present

## 2017-09-28 DIAGNOSIS — Z7951 Long term (current) use of inhaled steroids: Secondary | ICD-10-CM | POA: Diagnosis not present

## 2017-09-28 DIAGNOSIS — Z96643 Presence of artificial hip joint, bilateral: Secondary | ICD-10-CM | POA: Diagnosis present

## 2017-09-28 DIAGNOSIS — N390 Urinary tract infection, site not specified: Secondary | ICD-10-CM | POA: Diagnosis not present

## 2017-09-28 DIAGNOSIS — R55 Syncope and collapse: Secondary | ICD-10-CM | POA: Diagnosis not present

## 2017-09-28 DIAGNOSIS — J9611 Chronic respiratory failure with hypoxia: Secondary | ICD-10-CM | POA: Diagnosis present

## 2017-09-28 DIAGNOSIS — Z79899 Other long term (current) drug therapy: Secondary | ICD-10-CM

## 2017-09-28 DIAGNOSIS — J432 Centrilobular emphysema: Secondary | ICD-10-CM | POA: Diagnosis not present

## 2017-09-28 DIAGNOSIS — R5381 Other malaise: Secondary | ICD-10-CM | POA: Diagnosis present

## 2017-09-28 DIAGNOSIS — M1990 Unspecified osteoarthritis, unspecified site: Secondary | ICD-10-CM | POA: Diagnosis present

## 2017-09-28 LAB — URINALYSIS, COMPLETE (UACMP) WITH MICROSCOPIC
Bilirubin Urine: NEGATIVE
GLUCOSE, UA: NEGATIVE mg/dL
HGB URINE DIPSTICK: NEGATIVE
Ketones, ur: NEGATIVE mg/dL
Leukocytes, UA: NEGATIVE
NITRITE: POSITIVE — AB
PH: 6 (ref 5.0–8.0)
PROTEIN: NEGATIVE mg/dL
Specific Gravity, Urine: 1.015 (ref 1.005–1.030)

## 2017-09-28 LAB — CBC WITH DIFFERENTIAL/PLATELET
BASOS PCT: 0 %
Basophils Absolute: 0 10*3/uL (ref 0–0.1)
EOS ABS: 0.3 10*3/uL (ref 0–0.7)
Eosinophils Relative: 5 %
HCT: 39.1 % (ref 35.0–47.0)
HEMOGLOBIN: 13.5 g/dL (ref 12.0–16.0)
Lymphocytes Relative: 19 %
Lymphs Abs: 1 10*3/uL (ref 1.0–3.6)
MCH: 30.9 pg (ref 26.0–34.0)
MCHC: 34.6 g/dL (ref 32.0–36.0)
MCV: 89.4 fL (ref 80.0–100.0)
Monocytes Absolute: 0.5 10*3/uL (ref 0.2–0.9)
Monocytes Relative: 10 %
NEUTROS PCT: 66 %
Neutro Abs: 3.4 10*3/uL (ref 1.4–6.5)
Platelets: 161 10*3/uL (ref 150–440)
RBC: 4.37 MIL/uL (ref 3.80–5.20)
RDW: 14.8 % — ABNORMAL HIGH (ref 11.5–14.5)
WBC: 5.2 10*3/uL (ref 3.6–11.0)

## 2017-09-28 LAB — BASIC METABOLIC PANEL
Anion gap: 7 (ref 5–15)
BUN: 10 mg/dL (ref 6–20)
CHLORIDE: 105 mmol/L (ref 101–111)
CO2: 26 mmol/L (ref 22–32)
CREATININE: 0.52 mg/dL (ref 0.44–1.00)
Calcium: 8 mg/dL — ABNORMAL LOW (ref 8.9–10.3)
Glucose, Bld: 107 mg/dL — ABNORMAL HIGH (ref 65–99)
POTASSIUM: 3.2 mmol/L — AB (ref 3.5–5.1)
SODIUM: 138 mmol/L (ref 135–145)

## 2017-09-28 LAB — BRAIN NATRIURETIC PEPTIDE: B Natriuretic Peptide: 110 pg/mL — ABNORMAL HIGH (ref 0.0–100.0)

## 2017-09-28 LAB — TROPONIN I: Troponin I: <0.03 ng/mL (ref <0.03)

## 2017-09-28 MED ORDER — TRAZODONE HCL 50 MG PO TABS
50.0000 mg | ORAL_TABLET | Freq: Every day | ORAL | Status: DC
  Administered 2017-09-28 – 2017-10-01 (×4): 50 mg via ORAL
  Filled 2017-09-28 (×4): qty 1

## 2017-09-28 MED ORDER — ACETAMINOPHEN 325 MG PO TABS: 650.0000 mg | ORAL_TABLET | Freq: Four times a day (QID) | ORAL | Status: DC | PRN

## 2017-09-28 MED ORDER — MELATONIN 5 MG PO TABS
15.0000 mg | ORAL_TABLET | Freq: Every day | ORAL | Status: DC
  Administered 2017-09-28 – 2017-10-01 (×4): 15 mg via ORAL
  Filled 2017-09-28 (×5): qty 3

## 2017-09-28 MED ORDER — TIOTROPIUM BROMIDE MONOHYDRATE 18 MCG IN CAPS
18.0000 ug | ORAL_CAPSULE | Freq: Every day | RESPIRATORY_TRACT | Status: DC
  Administered 2017-09-29 – 2017-10-02 (×4): 18 ug via RESPIRATORY_TRACT
  Filled 2017-09-28: qty 5

## 2017-09-28 MED ORDER — ALBUTEROL SULFATE (2.5 MG/3ML) 0.083% IN NEBU
2.5000 mg | INHALATION_SOLUTION | RESPIRATORY_TRACT | Status: DC | PRN
  Administered 2017-09-30: 2.5 mg via RESPIRATORY_TRACT
  Filled 2017-09-28: qty 3

## 2017-09-28 MED ORDER — PANTOPRAZOLE SODIUM 40 MG PO TBEC
40.0000 mg | DELAYED_RELEASE_TABLET | Freq: Every day | ORAL | Status: DC
  Administered 2017-09-28 – 2017-10-02 (×5): 40 mg via ORAL
  Filled 2017-09-28 (×5): qty 1

## 2017-09-28 MED ORDER — IPRATROPIUM-ALBUTEROL 0.5-2.5 (3) MG/3ML IN SOLN
RESPIRATORY_TRACT | Status: AC
  Filled 2017-09-28: qty 9

## 2017-09-28 MED ORDER — ONDANSETRON HCL 4 MG PO TABS: 4.0000 mg | ORAL_TABLET | Freq: Four times a day (QID) | ORAL | Status: DC | PRN

## 2017-09-28 MED ORDER — SODIUM CHLORIDE 0.9 % IV SOLN: 250.0000 mL | INTRAVENOUS | Status: DC | PRN

## 2017-09-28 MED ORDER — IPRATROPIUM-ALBUTEROL 0.5-2.5 (3) MG/3ML IN SOLN
3.0000 mL | Freq: Once | RESPIRATORY_TRACT | Status: AC
Start: 2017-09-28 — End: 2017-09-28
  Administered 2017-09-28: 3 mL via RESPIRATORY_TRACT

## 2017-09-28 MED ORDER — SENNOSIDES-DOCUSATE SODIUM 8.6-50 MG PO TABS: 1.0000 | ORAL_TABLET | Freq: Every evening | ORAL | Status: DC | PRN

## 2017-09-28 MED ORDER — MECLIZINE HCL 25 MG PO TABS
25.0000 mg | ORAL_TABLET | Freq: Three times a day (TID) | ORAL | Status: DC | PRN
  Administered 2017-09-29 (×2): 25 mg via ORAL
  Filled 2017-09-28 (×2): qty 1

## 2017-09-28 MED ORDER — SODIUM CHLORIDE 0.9 % IV BOLUS
1000.0000 mL | Freq: Once | INTRAVENOUS | Status: AC
  Administered 2017-09-28: 1000 mL via INTRAVENOUS

## 2017-09-28 MED ORDER — SODIUM CHLORIDE 0.9 % IV SOLN
1.0000 g | INTRAVENOUS | Status: DC
  Administered 2017-09-29 – 2017-10-01 (×3): 1 g via INTRAVENOUS
  Filled 2017-09-28 (×3): qty 10
  Filled 2017-09-28: qty 1

## 2017-09-28 MED ORDER — ROPINIROLE HCL 1 MG PO TABS
3.0000 mg | ORAL_TABLET | Freq: Every day | ORAL | Status: DC
  Administered 2017-09-29 – 2017-10-01 (×3): 3 mg via ORAL
  Filled 2017-09-28 (×3): qty 3

## 2017-09-28 MED ORDER — ENOXAPARIN SODIUM 40 MG/0.4ML ~~LOC~~ SOLN
40.0000 mg | Freq: Two times a day (BID) | SUBCUTANEOUS | Status: DC
  Administered 2017-09-28 – 2017-10-02 (×8): 40 mg via SUBCUTANEOUS
  Filled 2017-09-28 (×8): qty 0.4

## 2017-09-28 MED ORDER — ONDANSETRON HCL 4 MG/2ML IJ SOLN: 4.0000 mg | Freq: Four times a day (QID) | INTRAMUSCULAR | Status: DC | PRN

## 2017-09-28 MED ORDER — SODIUM CHLORIDE 0.9 % IV SOLN
1.0000 g | Freq: Once | INTRAVENOUS | Status: AC
  Administered 2017-09-28: 1 g via INTRAVENOUS
  Filled 2017-09-28: qty 10

## 2017-09-28 MED ORDER — IPRATROPIUM-ALBUTEROL 0.5-2.5 (3) MG/3ML IN SOLN
3.0000 mL | Freq: Once | RESPIRATORY_TRACT | Status: AC
  Administered 2017-09-28: 3 mL via RESPIRATORY_TRACT

## 2017-09-28 MED ORDER — IBUPROFEN 400 MG PO TABS: 600.0000 mg | ORAL_TABLET | Freq: Four times a day (QID) | ORAL | Status: DC | PRN

## 2017-09-28 MED ORDER — SODIUM CHLORIDE 0.9% FLUSH
3.0000 mL | Freq: Two times a day (BID) | INTRAVENOUS | Status: DC
  Administered 2017-09-28 – 2017-10-02 (×8): 3 mL via INTRAVENOUS

## 2017-09-28 MED ORDER — MOMETASONE FURO-FORMOTEROL FUM 200-5 MCG/ACT IN AERO: 2.0000 | INHALATION_SPRAY | Freq: Two times a day (BID) | RESPIRATORY_TRACT | Status: DC

## 2017-09-28 MED ORDER — POTASSIUM CHLORIDE CRYS ER 20 MEQ PO TBCR
40.0000 meq | EXTENDED_RELEASE_TABLET | Freq: Once | ORAL | Status: AC
  Administered 2017-09-28: 40 meq via ORAL
  Filled 2017-09-28: qty 2

## 2017-09-28 MED ORDER — IPRATROPIUM-ALBUTEROL 0.5-2.5 (3) MG/3ML IN SOLN
3.0000 mL | Freq: Four times a day (QID) | RESPIRATORY_TRACT | Status: DC
  Administered 2017-09-28 – 2017-09-29 (×5): 3 mL via RESPIRATORY_TRACT
  Filled 2017-09-28 (×5): qty 3

## 2017-09-28 MED ORDER — IPRATROPIUM-ALBUTEROL 0.5-2.5 (3) MG/3ML IN SOLN
3.0000 mL | Freq: Once | RESPIRATORY_TRACT | Status: AC
  Administered 2017-09-28: 3 mL via RESPIRATORY_TRACT
  Filled 2017-09-28: qty 9

## 2017-09-28 MED ORDER — POTASSIUM CHLORIDE CRYS ER 20 MEQ PO TBCR
40.0000 meq | EXTENDED_RELEASE_TABLET | Freq: Once | ORAL | Status: DC
  Filled 2017-09-28: qty 2

## 2017-09-28 MED ORDER — ACETAMINOPHEN 650 MG RE SUPP: 650.0000 mg | Freq: Four times a day (QID) | RECTAL | Status: DC | PRN

## 2017-09-28 MED ORDER — LEVETIRACETAM 500 MG PO TABS
500.0000 mg | ORAL_TABLET | Freq: Two times a day (BID) | ORAL | Status: DC
  Administered 2017-09-28 – 2017-10-02 (×8): 500 mg via ORAL
  Filled 2017-09-28 (×9): qty 1

## 2017-09-28 MED ORDER — SODIUM CHLORIDE 0.9% FLUSH: 3.0000 mL | INTRAVENOUS | Status: DC | PRN

## 2017-09-28 MED ORDER — ALBUTEROL SULFATE HFA 108 (90 BASE) MCG/ACT IN AERS: 2.0000 | INHALATION_SPRAY | Freq: Four times a day (QID) | RESPIRATORY_TRACT | 2 refills | Status: DC | PRN

## 2017-09-28 MED ORDER — METHYLPREDNISOLONE SODIUM SUCC 125 MG IJ SOLR
125.0000 mg | Freq: Once | INTRAMUSCULAR | Status: AC
  Administered 2017-09-28: 125 mg via INTRAVENOUS
  Filled 2017-09-28: qty 2

## 2017-09-28 MED ORDER — PREDNISONE 20 MG PO TABS: 60.0000 mg | ORAL_TABLET | Freq: Every day | ORAL | 0 refills | Status: DC

## 2017-09-28 NOTE — H&P (Addendum)
[place] [facility] [place] NAME: Susan [name]    MR#:  [id number]  DATE OF BIRTH:  March 06, 1941  DATE OF ADMISSION:  09/28/2017  PRIMARY CARE PHYSICIAN: [name], MD   REQUESTING/REFERRING PHYSICIAN:   CHIEF COMPLAINT:   Chief Complaint  Patient presents with  . Dizziness    HISTORY OF PRESENT ILLNESS: Susan [name]  is a 77 y.o. female with a known history of colonic diverticulosis, COPD, ventral hernia, arthritis, peptic ulcer presented to the emergency room for dizziness.  Patient also felt lightheaded and dizzy when she woke up this morning.  She also has foul-smelling urine and dysuria.  No history of loss of consciousness and syncope.  After she came to the emergency room the dizziness is better.  She also has some dry cough.  No nausea vomiting and diarrhea.  She was evaluated in the emergency room found to have urinary tract infection.  No complains of any tingling, numbness in any part of the body.  She was started on IV antibiotics for urinary tract infection.  She was worked up with chest x-ray which showed a interstitial edema hence IV fluids were not given.  PAST MEDICAL HISTORY:   Past Medical History:  Diagnosis Date  . Acute peptic ulcer, unspecified site, with hemorrhage and perforation, without mention of obstruction   . Arthritis   . Asthma   . Balance problems    unsteady on feet at time  . Chronic airway obstruction, not elsewhere classified    on inhalers  . Diverticulosis of colon (without mention of hemorrhage)   . Hematochezia   . 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 Ventral hernia     PAST SURGICAL HISTORY:  Past Surgical History:  Procedure Laterality Date  . APPENDECTOMY    . BUNIONECTOMY Bilateral   . COSMETIC SURGERY  1970's   abdomen. Tummy Tuck  . FOOT SURGERY Bilateral dec 2015 right    left 3 yrs ago  . RIGHT/LEFT HEART CATH AND CORONARY ANGIOGRAPHY Bilateral 07/08/2016    Procedure: Right/Left Heart Cath and Coronary Angiography;  Surgeon: Minna Merritts, MD;  Location: [facility];  Service: Cardiovascular;  Laterality: Bilateral;  . TOTAL HIP ARTHROPLASTY  2007   left  . TOTAL HIP ARTHROPLASTY     bilateral   . TOTAL KNEE ARTHROPLASTY Right 08/08/2014   Procedure: RIGHT TOTAL KNEE ARTHROPLASTY WITH NAVIGATION;  Surgeon: Rod Can, MD;  Location: [facility];  Service: Orthopedics;  Laterality: Right;  . TOTAL KNEE ARTHROPLASTY Left 10/03/2014   Procedure: LEFT TOTAL KNEE ARTHROPLASTY;  Surgeon: Rod Can, MD;  Location: [facility];  Service: Orthopedics;  Laterality: Left;  . TUBAL LIGATION    . UMBILICAL HERNIA REPAIR     2016    SOCIAL HISTORY:  Social History   Tobacco Use  . Smoking status: Former Smoker    Packs/day: 0.50    Years: 40.00    Pack years: 20.00    Types: Cigarettes    Last attempt to quit: 08/29/2000    Years since quitting: 17.0  . Smokeless tobacco: Never Used  Substance Use Topics  . Alcohol use: No    Alcohol/week: 0.0 oz    Comment: rare    FAMILY HISTORY:  Family History  Problem Relation Age of Onset  . Diabetes Mother   . Coronary artery disease Mother   . Lung disease Mother   . Heart disease Mother  CAD  . Coronary artery disease Father   . Kidney disease Father   . Diabetes Father   . Heart disease Father        CAD  . Cancer Neg Hx     DRUG ALLERGIES: No Known Allergies  REVIEW OF SYSTEMS:   CONSTITUTIONAL: No fever, has fatigue and weakness.  EYES: No blurred or double vision.  EARS, NOSE, AND THROAT: No tinnitus or ear pain.  RESPIRATORY: has cough,  No shortness of breath, wheezing or hemoptysis.  CARDIOVASCULAR: No chest pain, orthopnea, edema.  GASTROINTESTINAL: No nausea, vomiting, diarrhea or abdominal pain.  GENITOURINARY: Has dysuria,no hematuria.  ENDOCRINE: No polyuria, nocturia,  HEMATOLOGY: No anemia, easy bruising or bleeding SKIN: No rash or  lesion. MUSCULOSKELETAL: No joint pain or arthritis.   NEUROLOGIC: No tingling, numbness, weakness.  PSYCHIATRY: No anxiety or depression.   MEDICATIONS AT HOME:  Prior to Admission medications   Medication Sig Start Date End Date Taking? Authorizing Provider  albuterol (PROVENTIL HFA;VENTOLIN HFA) 108 (90 Base) MCG/ACT inhaler Inhale 2 puffs into the lungs every 6 (six) hours as needed for wheezing or shortness of breath. 07/17/17   Bedsole, Amy E, MD  albuterol (PROVENTIL HFA;VENTOLIN HFA) 108 (90 Base) MCG/ACT inhaler Inhale 2 puffs into the lungs every 6 (six) hours as needed for wheezing or shortness of breath. 09/28/17   Alfred Levins, [place], MD  budesonide-formoterol [facility]) 160-4.5 MCG/ACT inhaler TAKE 2 PUFFS INTO LUNGS TWICE A DAY Patient taking differently: Inhale 2 puffs into the lungs 2 (two) times daily.  04/24/17   Bedsole, Amy E, MD  ibuprofen (ADVIL,MOTRIN) 200 MG tablet Take 600 mg by mouth every 6 (six) hours as needed for headache or moderate pain.     [provider]  levETIRAcetam (KEPPRA) 500 MG tablet Take by mouth. 08/08/17 02/04/18  [provider]  Melatonin 10 MG TABS Take 15 mg by mouth at bedtime.    [provider]  Naproxen Sod-Diphenhydramine (ALEVE PM) 220-25 MG TABS Take 1 tablet by mouth at bedtime.    [provider]  predniSONE (DELTASONE) 20 MG tablet Take 3 tablets (60 mg total) by mouth daily for 4 days. 09/28/17 10/02/17  Rudene Re, MD  rOPINIRole (REQUIP) 3 MG tablet Take 3 mg by mouth at bedtime.    [provider]  rOPINIRole (REQUIP) 3 MG tablet TAKE ONE TABLET BY MOUTH AT BEDTIME 09/01/17   Bedsole, Amy E, MD  tiotropium (SPIRIVA) 18 MCG inhalation capsule Place 1 capsule (18 mcg total) into inhaler and inhale daily. 08/11/17   Bedsole, Amy E, MD  traZODone (DESYREL) 50 MG tablet 1 AND 1/2 TABLETS BY MOUTH AT BEDTIME ASNEEDED FOR SLEEP 09/05/17   Bedsole, Amy E, MD      PHYSICAL EXAMINATION:   VITAL  SIGNS: Blood pressure (!) 147/104, pulse (!) 119, temperature 97.8 F (36.6 C), temperature source Oral, resp. rate (!) 29, height 5' (1.524 m), weight 96.2 kg (212 lb), SpO2 92 %.  GENERAL:  77 y.o.-year-old patient lying in the bed with no acute distress.  EYES: Pupils equal, round, reactive to light and accommodation. No scleral icterus. Extraocular muscles intact.  HEENT: Head atraumatic, normocephalic. Oropharynx and nasopharynx clear.  NECK:  Supple, no jugular venous distention. No thyroid enlargement, no tenderness.  LUNGS: Normal breath sounds bilaterally,basal rales heard. No use of accessory muscles of respiration.  CARDIOVASCULAR: S1, S2 normal. No murmurs, rubs, or gallops.  ABDOMEN: Soft, nontender, nondistended. Bowel sounds present. No organomegaly or mass.  EXTREMITIES: No pedal edema, cyanosis, or clubbing.  NEUROLOGIC: Cranial nerves II through XII are intact. Muscle strength 5/5 in all extremities. Sensation intact. Gait not checked.  PSYCHIATRIC: The patient is alert and oriented x 3.  SKIN: No obvious rash, lesion, or ulcer.   LABORATORY PANEL:   CBC Recent Labs  Lab 09/28/17 1326  WBC 5.2  HGB 13.5  HCT 39.1  PLT 161  MCV 89.4  MCH 30.9  MCHC 34.6  RDW 14.8*  LYMPHSABS 1.0  MONOABS 0.5  EOSABS 0.3  BASOSABS 0.0   ------------------------------------------------------------------------------------------------------------------  Chemistries  Recent Labs  Lab 09/28/17 1326  NA 138  K 3.2*  CL 105  CO2 26  GLUCOSE 107*  BUN 10  CREATININE 0.52  CALCIUM 8.0*   ------------------------------------------------------------------------------------------------------------------ estimated creatinine clearance is 63.1 mL/min (by C-G formula based on SCr of 0.52 mg/dL). ------------------------------------------------------------------------------------------------------------------ No results for input(s): TSH, T4TOTAL, T3FREE, THYROIDAB in the last 72  hours.  Invalid input(s): FREET3   Coagulation profile No results for input(s): INR, PROTIME in the last 168 hours. ------------------------------------------------------------------------------------------------------------------- No results for input(s): DDIMER in the last 72 hours. -------------------------------------------------------------------------------------------------------------------  Cardiac Enzymes Recent Labs  Lab 09/28/17 1326  TROPONINI <0.03   ------------------------------------------------------------------------------------------------------------------ Invalid input(s): POCBNP  ---------------------------------------------------------------------------------------------------------------  Urinalysis    Component Value Date/Time   COLORURINE YELLOW (A) 09/28/2017 1316   APPEARANCEUR HAZY (A) 09/28/2017 1316   LABSPEC 1.015 09/28/2017 1316   PHURINE 6.0 09/28/2017 1316   GLUCOSEU NEGATIVE 09/28/2017 1316   HGBUR NEGATIVE 09/28/2017 1316   HGBUR small 03/25/2009 1510   BILIRUBINUR NEGATIVE 09/28/2017 1316   BILIRUBINUR negative 07/11/2017 1214   KETONESUR NEGATIVE 09/28/2017 1316   PROTEINUR NEGATIVE 09/28/2017 1316   UROBILINOGEN 0.2 07/11/2017 1214   UROBILINOGEN 1.0 09/26/2014 1035   NITRITE POSITIVE (A) 09/28/2017 1316   LEUKOCYTESUR NEGATIVE 09/28/2017 1316     RADIOLOGY: Dg Chest 2 View  Result Date: 09/28/2017 CLINICAL DATA:  Cough and shortness of breath EXAM: CHEST - 2 VIEW COMPARISON:  Chest radiograph 07/06/2017 FINDINGS: Shallow lung inflation with unchanged cardiomegaly. Diffusely increased interstitial opacity no pleural effusion or pneumothorax. No focal consolidation. Bilateral glenohumeral joint degenerative change. IMPRESSION: 1. Diffusely increased interstitial opacity may be secondary to chronic interstitial/bronchitic changes, but mild superimposed interstitial pulmonary edema would be difficult to exclude. 2. Mild cardiomegaly.  Electronically Signed   By: Ulyses Jarred M.D.   On: 09/28/2017 13:59    EKG: Orders placed or performed during the hospital encounter of 09/28/17  . EKG 12-Lead  . EKG 12-Lead  . EKG 12-Lead  . EKG 12-Lead    IMPRESSION AND PLAN:  Aquarius Tremper  is a 77 y.o. female with a known history of colonic diverticulosis, COPD, ventral hernia, arthritis, peptic ulcer presented to the emergency room for dizziness.    -Vertigo Symptomatically improved PRN meclizine  -Acute urinary tract infection Start patient on IV Rocephin antibiotic Follow-up cultures  -Acute hypokalemia Replace potassium  -Acute bronchitis with underlying  interstitial lung disease/copd Patient currently on Rocephin antibiotic already for UTI which will cover for bronchitis Nebulization treatments   -History of peptic ulcer Continue PPI  -DVT prophylaxis Subcu Lovenox daily  All the records are reviewed and case discussed with ED provider. Management plans discussed with the patient, family and they are in agreement.  CODE STATUS:DNR    Code Status Orders  (From admission, onward)        Start     Ordered   09/28/17 1827  Do not attempt resuscitation (  DNR)  Continuous    Question Answer Comment  In the event of cardiac or respiratory ARREST Do not call a "code blue"   In the event of cardiac or respiratory ARREST Do not perform Intubation, CPR, defibrillation or ACLS   In the event of cardiac or respiratory ARREST Use medication by any route, position, wound care, and other measures to relive pain and suffering. May use oxygen, suction and manual treatment of airway obstruction as needed for comfort.      09/28/17 1826    Code Status History    Date Active Date Inactive Code Status Order ID Comments User Context   07/06/2017 2033 07/07/2017 1844 Full Code [id number]  Henreitta Leber, MD Inpatient   04/18/2016 0153 04/21/2016 2039 Full Code [id number]  Harvie Bridge, DO Inpatient   10/03/2014 1439  10/05/2014 1649 Full Code [id number]  Rod Can, MD Inpatient   08/08/2014 1222 08/10/2014 1800 Full Code [id number]  Rod Can, MD Inpatient       TOTAL TIME TAKING CARE OF THIS PATIENT: 52 minutes.    Saundra Shelling M.D on 09/28/2017 at 6:28 PM  Between 7am to 6pm - Pager - [phone]  After 6pm go to [email or web address] - password [email or web address] [facility]  Office  [phone]  CC: Primary care physician; [name], MD

## 2017-09-28 NOTE — ED Notes (Signed)
Patient transported to room 103 by this EDT.

## 2017-09-28 NOTE — ED Provider Notes (Addendum)
[facility] Emergency Department Provider Note  ____________________________________________  Time seen: Approximately 1:50 PM  I have reviewed the triage vital signs and the nursing notes.   HISTORY  Chief Complaint Dizziness   HPI Susan [name] is a 77 y.o. 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  Patient reports that she woke up this morning feeling dizzy.  She describes the dizziness as feeling lightheaded like she is going to pass out.  She reports that she started feeling like that when she got up from the bed to go to the bathroom.  The dizzy did not resolve after she laid back down.  She reports that the dizziness was constant and severe.  She denies LOC.  She reports that at this time the dizziness has resolved.  She is also complaining of shortness of breath which is a chronic issue for her however she thinks she may be feeling slightly worse since yesterday and also has a mild dry cough since yesterday.  No fever or chills.  No chest pain.  No nausea, vomiting, diarrhea.  She denies requiring any more oxygen than her baseline.  She does endorse dysuria and urinary frequency for a few days.  No flank pain.  She denies room spinning sensation, headache, unilateral weakness or numbness, slurred speech, facial droop.  Past Medical History:  Diagnosis Date  . Acute peptic ulcer, unspecified site, with hemorrhage and perforation, without mention of obstruction   . Arthritis   . Asthma   . Balance problems    unsteady on feet at time  . Chronic airway obstruction, not elsewhere classified    on inhalers  . Diverticulosis of colon (without mention of hemorrhage)   . Hematochezia   . 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 Ventral hernia     Patient Active Problem List   Diagnosis Date Noted  . GAD (generalized anxiety disorder)  08/11/2017  . Hypoxia 08/11/2017  . Ischemic vascular dementia 08/11/2017  . Aphasia 07/06/2017  . Dysphoric mood 04/24/2017  . Aspiration into airway 08/17/2016  . Unstable angina ([place]) 07/08/2016  . Mixed hyperlipidemia   . Aortic atherosclerosis ([place]) 07/06/2016  . Coronary artery disease involving native coronary artery of native heart with unstable angina pectoris ([place]) 07/06/2016  . Klebsiella sepsis ([place]) 04/26/2016  . E coli bacteremia 04/26/2016  . Acute respiratory failure with hypoxia ([place]) 04/18/2016  . Near syncope 10/08/2015  . Transient vision disturbance, right 10/08/2015  . Balance problem 10/08/2015  . ILD (interstitial lung disease) ([place]) 07/29/2015  . Dizziness 01/02/2015  . Nasal congestion due to prolonged use of decongestants 12/09/2014  . Chronic insomnia 12/09/2014  . Primary osteoarthritis of left knee 10/03/2014  . Primary osteoarthritis of right knee 08/08/2014  . Elevated blood-pressure reading without diagnosis of hypertension 07/31/2014  . Osteoarthritis of right knee, Severe 10/28/2013  . Glenohumeral arthritis, Severe 08/20/2013  . Centrilobular emphysema ([place]) 03/25/2013  . Solitary pulmonary nodule 02/19/2013  . Shortness of breath 02/12/2013  . Chronic knee pain 01/15/2013  . Obstructive sleep apnea 10/11/2012  . Umbilical hernia 24/26/8341  . Urinary incontinence, urge 01/17/2012  . Diastolic dysfunction 96/22/2979  . Preoperative cardiovascular examination 04/21/2011  . DDD (degenerative disc disease) 10/26/2010  . RESTLESS LEG SYNDROME 02/23/2010  . HIP REPLACEMENT, LEFT, HX OF 12/21/2009  . FOOT PAIN, BILATERAL 03/25/2009  . HYPERCHOLESTEROLEMIA 06/18/2008  . ALLERGIC RHINITIS  05/21/2008  . ANKLE PAIN, BILATERAL 05/21/2008  . DIVERTICULOSIS, COLON 11/26/2007  . 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 APPENDECTOMY    . BUNIONECTOMY Bilateral   .  COSMETIC SURGERY  1970's   abdomen. Tummy Tuck  . FOOT SURGERY Bilateral dec 2015 right    left 3 yrs ago  . RIGHT/LEFT HEART CATH AND CORONARY ANGIOGRAPHY Bilateral 07/08/2016   Procedure: Right/Left Heart Cath and Coronary Angiography;  Surgeon: Minna Merritts, MD;  Location: [facility];  Service: Cardiovascular;  Laterality: Bilateral;  . TOTAL HIP ARTHROPLASTY  2007   left  . TOTAL HIP ARTHROPLASTY     bilateral   . TOTAL KNEE ARTHROPLASTY Right 08/08/2014   Procedure: RIGHT TOTAL KNEE ARTHROPLASTY WITH NAVIGATION;  Surgeon: Rod Can, MD;  Location: [facility];  Service: Orthopedics;  Laterality: Right;  . TOTAL KNEE ARTHROPLASTY Left 10/03/2014   Procedure: LEFT TOTAL KNEE ARTHROPLASTY;  Surgeon: Rod Can, MD;  Location: [facility];  Service: Orthopedics;  Laterality: Left;  . TUBAL LIGATION    . UMBILICAL HERNIA REPAIR     2016    Prior to Admission medications   Medication Sig Start Date End Date Taking? Authorizing Provider  albuterol (PROVENTIL HFA;VENTOLIN HFA) 108 (90 Base) MCG/ACT inhaler Inhale 2 puffs into the lungs every 6 (six) hours as needed for wheezing or shortness of breath. 07/17/17   Bedsole, Amy E, MD  albuterol (PROVENTIL HFA;VENTOLIN HFA) 108 (90 Base) MCG/ACT inhaler Inhale 2 puffs into the lungs every 6 (six) hours as needed for wheezing or shortness of breath. 09/28/17   Alfred Levins, [place], MD  budesonide-formoterol [facility]) 160-4.5 MCG/ACT inhaler TAKE 2 PUFFS INTO LUNGS TWICE A DAY Patient taking differently: Inhale 2 puffs into the lungs 2 (two) times daily.  04/24/17   Bedsole, Amy E, MD  ibuprofen (ADVIL,MOTRIN) 200 MG tablet Take 600 mg by mouth every 6 (six) hours as needed for headache or moderate pain.     [provider]  levETIRAcetam (KEPPRA) 500 MG tablet Take by mouth. 08/08/17 02/04/18  [provider]  Melatonin 10 MG TABS Take 15 mg by mouth at bedtime.    [provider]  Naproxen Sod-Diphenhydramine  (ALEVE PM) 220-25 MG TABS Take 1 tablet by mouth at bedtime.    [provider]  predniSONE (DELTASONE) 20 MG tablet Take 3 tablets (60 mg total) by mouth daily for 4 days. 09/28/17 10/02/17  Rudene Re, MD  rOPINIRole (REQUIP) 3 MG tablet Take 3 mg by mouth at bedtime.    [provider]  rOPINIRole (REQUIP) 3 MG tablet TAKE ONE TABLET BY MOUTH AT BEDTIME 09/01/17   Bedsole, Amy E, MD  tiotropium (SPIRIVA) 18 MCG inhalation capsule Place 1 capsule (18 mcg total) into inhaler and inhale daily. 08/11/17   Bedsole, Amy E, MD  traZODone (DESYREL) 50 MG tablet 1 AND 1/2 TABLETS BY MOUTH AT BEDTIME ASNEEDED FOR SLEEP 09/05/17   [name], MD    Allergies Patient has no known allergies.  Family History  Problem Relation Age of Onset  . Diabetes Mother   . Coronary artery disease Mother   . Lung disease Mother   . Heart disease Mother        CAD  . Coronary artery disease Father   . Kidney disease Father   . Diabetes Father   . Heart disease Father  CAD  . Cancer Neg Hx     Social History Social History   Tobacco Use  . Smoking status: Former Smoker    Packs/day: 0.50    Years: 40.00    Pack years: 20.00    Types: Cigarettes    Last attempt to quit: 08/29/2000    Years since quitting: 17.0  . Smokeless tobacco: Never Used  Substance Use Topics  . Alcohol use: No    Alcohol/week: 0.0 oz    Comment: rare  . Drug use: No    Review of Systems  Constitutional: Negative for fever. + dizziness Eyes: Negative for visual changes. ENT: Negative for sore throat. Neck: No neck pain  Cardiovascular: Negative for chest pain. Respiratory: + shortness of breath, cough Gastrointestinal: Negative for abdominal pain, vomiting or diarrhea. Genitourinary: Negative for dysuria. Musculoskeletal: Negative for back pain. Skin: Negative for rash. Neurological: Negative for headaches, weakness or numbness. Psych: No SI or  HI  ____________________________________________   PHYSICAL EXAM:  VITAL SIGNS: ED Triage Vitals  Enc Vitals Group     BP 09/28/17 1245 135/85     Pulse Rate 09/28/17 1252 78     Resp 09/28/17 1252 20     Temp 09/28/17 1252 97.8 F (36.6 C)     Temp Source 09/28/17 1252 Oral     SpO2 09/28/17 1252 96 %     Weight 09/28/17 1249 212 lb (96.2 kg)     Height 09/28/17 1249 5' (1.524 m)     Head Circumference --      Peak Flow --      Pain Score 09/28/17 1249 0     Pain Loc --      Pain Edu? --      Excl. in [place]? --     Constitutional: Alert and oriented. Well appearing and in no apparent distress. HEENT:      Head: Normocephalic and atraumatic.         Eyes: Conjunctivae are normal. Sclera is non-icteric.       Mouth/Throat: Mucous membranes are moist.       Neck: Supple with no signs of meningismus. No bruits Cardiovascular: Regular rate and rhythm. No murmurs, gallops, or rubs. 2+ symmetrical distal pulses are present in all extremities. No JVD. Respiratory: Slightly increased work of breathing, tachypneic, decreased air movement with faint expiratory wheezes satting well on 2 L nasal cannula  Gastrointestinal: Soft, non tender, and non distended with positive bowel sounds. No rebound or guarding. Musculoskeletal: Trace pitting edema bilaterally Neurologic: Normal speech and language. Face is symmetric. Moving all extremities. No gross focal neurologic deficits are appreciated. Skin: Skin is warm, dry and intact. No rash noted. Psychiatric: Mood and affect are normal. Speech and behavior are normal.  ____________________________________________   LABS (all labs ordered are listed, but only abnormal results are displayed)  Labs Reviewed  CBC WITH DIFFERENTIAL/PLATELET - Abnormal; Notable for the following components:      Result Value   RDW 14.8 (*)    All other components within normal limits  BASIC METABOLIC PANEL - Abnormal; Notable for the following components:    Potassium 3.2 (*)    Glucose, Bld 107 (*)    Calcium 8.0 (*)    All other components within normal limits  TROPONIN I  URINALYSIS, COMPLETE (UACMP) WITH MICROSCOPIC  BRAIN NATRIURETIC PEPTIDE   ____________________________________________  EKG  ED ECG REPORT I, Rudene Re, the attending physician, personally viewed and interpreted this ECG.  Sinus rhythm, rate  of 81, frequent PVCs, normal axis, no ST elevations or depressions, normal intervals.  Unchanged from prior from 2/19 ____________________________________________  RADIOLOGY  I have personally reviewed the images performed during this visit and I agree with the Radiologist's read.   Interpretation by Radiologist:  Dg Chest 2 View  Result Date: 09/28/2017 CLINICAL DATA:  Cough and shortness of breath EXAM: CHEST - 2 VIEW COMPARISON:  Chest radiograph 07/06/2017 FINDINGS: Shallow lung inflation with unchanged cardiomegaly. Diffusely increased interstitial opacity no pleural effusion or pneumothorax. No focal consolidation. Bilateral glenohumeral joint degenerative change. IMPRESSION: 1. Diffusely increased interstitial opacity may be secondary to chronic interstitial/bronchitic changes, but mild superimposed interstitial pulmonary edema would be difficult to exclude. 2. Mild cardiomegaly. Electronically Signed   By: Ulyses Jarred M.D.   On: 09/28/2017 13:59      ____________________________________________   PROCEDURES  Procedure(s) performed: None Procedures Critical Care performed:  Yes  CRITICAL CARE Performed by: Rudene Re  ?  Total critical care time: 30 min  Critical care time was exclusive of separately billable procedures and treating other patients.  Critical care was necessary to treat or prevent imminent or life-threatening deterioration.  Critical care was time spent personally by me on the following activities: development of treatment plan with patient and/or surrogate as well as  nursing, discussions with consultants, evaluation of patient's response to treatment, examination of patient, obtaining history from patient or surrogate, ordering and performing treatments and interventions, ordering and review of laboratory studies, ordering and review of radiographic studies, pulse oximetry and re-evaluation of patient's condition.  ____________________________________________   INITIAL IMPRESSION / ASSESSMENT AND PLAN / ED COURSE  77 y.o. female with a history of interstitial lung disease on 2 L, dizziness, peptic ulcer disease, and hyperlipidemia who presents for evaluation of dizziness/lightheadedness especially when standing up.  Patient unable to undergo orthostatic vital signs as she became very lightheaded with sitting up.  There were no orthostatic changes from laying to sitting but patient refused to do standing.  EKG shows no ischemia or arrhythmias.  Patient does have decreased air movement bilaterally with expiratory wheezes concerning for possible viral URI/ PNA/ or pulmonary edema causing exacerbation of her interstitial lung disease. Will give steroids and duoneb. CXR pending. Patient also complaining of dysuria, will check UA.     _________________________ 3:09 PM on 09/28/2017 -----------------------------------------  Chest x-ray with no evidence of pneumonia, possible mild pulmonary edema. IVF not given. Patient being treated from bronchitis with duoneb and steroids. UA pending to rule out UTI. Labs with no other acute findings. BNP pending. Plan to reassess after treatments and UA and if patient is improved, plan to dc home. Care transferred to Dr. Cherylann Banas.   As part of my medical decision making, I reviewed the following data within the [facility] notes reviewed and incorporated, Labs reviewed , EKG interpreted , Old EKG reviewed, Old chart reviewed, Radiograph reviewed , Notes from prior ED visits and [place] Controlled Substance  Database    Pertinent labs & imaging results that were available during my care of the patient were reviewed by me and considered in my medical decision making (see chart for details).    ____________________________________________   FINAL CLINICAL IMPRESSION(S) / ED DIAGNOSES  Final diagnoses:  Bronchitis  Dysuria      NEW MEDICATIONS STARTED DURING THIS VISIT:  ED Discharge Orders        Ordered    predniSONE (DELTASONE) 20 MG tablet  Daily  09/28/17 1509    albuterol (PROVENTIL HFA;VENTOLIN HFA) 108 (90 Base) MCG/ACT inhaler  Every 6 hours PRN     09/28/17 1509       Note:  This document was prepared using Dragon voice recognition software and may include unintentional dictation errors.    Alfred Levins, [place], MD 09/28/17 [place], [place], MD 10/09/17 Vernelle Emerald

## 2017-09-28 NOTE — Progress Notes (Signed)
Advanced care plan.  Purpose of the Encounter: CODE STATUS  Parties in Attendance:Patient and family Patient's Decision Capacity:Good Subjective/Patient's story: Presented with weakness, dizziness and foul-smelling urine and dysuria Objective/Medical story Has UTI and bronchitis Goals of care determination:  Advanced directives discussed Patient does not want any cardiac resuscitation, intubation and ventilator if the need arises Goals met CODE STATUS: DNR Time spent discussing advanced care planning: 16 minutes

## 2017-09-28 NOTE — Progress Notes (Signed)
Pharmacy Antibiotic Note  Susan [name] is a 77 y.o. female admitted on 09/28/2017 with UTI.  Pharmacy has been consulted for Ceftriaxone dosing.  Plan: Ceftriaxone 1g IV q24h  Height: 5' (152.4 cm) Weight: 212 lb (96.2 kg) IBW/kg (Calculated) : 45.5  Temp (24hrs), Avg:97.8 F (36.6 C), Min:97.8 F (36.6 C), Max:97.8 F (36.6 C)  Recent Labs  Lab 09/28/17 1326  WBC 5.2  CREATININE 0.52    Estimated Creatinine Clearance: 63.1 mL/min (by C-G formula based on SCr of 0.52 mg/dL).    No Known Allergies   Thank you for allowing pharmacy to be a part of this patient's care.  Paulina Fusi, PharmD, BCPS 09/28/2017 8:03 PM

## 2017-09-28 NOTE — ED Triage Notes (Signed)
Patient to ED from home via [facility] c/o dizziness. Per EMS patient has been feeling dizzy most of the morning w/ hx of dizziness. Patient alert and oriented x4 and denies pain as well as NVD. No cute distress noted.

## 2017-09-28 NOTE — ED Notes (Signed)
Jenn RN, aware of bed assigned  

## 2017-09-28 NOTE — Progress Notes (Signed)
Anticoagulation monitoring(Lovenox):   77 yo  female ordered Lovenox 40 mg Q24h  Filed Weights   09/28/17 1249  Weight: 212 lb (96.2 kg)   BMI 41.4   Lab Results  Component Value Date   CREATININE 0.52 09/28/2017   CREATININE 0.65 07/06/2017   CREATININE 0.58 03/27/2017   Estimated Creatinine Clearance: 63.1 mL/min (by C-G formula based on SCr of 0.52 mg/dL). Hemoglobin & Hematocrit     Component Value Date/Time   HGB 13.5 09/28/2017 1326   HGB 12.9 07/06/2016 1159   HCT 39.1 09/28/2017 1326   HCT 40.3 07/06/2016 1159     Per Protocol for Patient with estCrcl > 30 ml/min and BMI > 40, will transition to Lovenox  40 mg Q12h.

## 2017-09-28 NOTE — ED Provider Notes (Signed)
-----------------------------------------   5:17 PM on 09/28/2017 -----------------------------------------  I took over care of this patient from Dr. Sullivan Lone.  Patient was pending UA which has resulted and is consistent with UTI.  On reassessment, the patient remains with O2 sat in the low 90s on 2 L by nasal cannula and states that she feels somewhat better than she did before but still lightheaded and short of breath.  She is tachycardic as well.  Based on the presence of UTI and likely bronchitis as well as the dizziness, abnormal vital signs, and the fact the patient feels too dizzy to walk around, she will require admission.  Patient agrees with this plan.  I signed the patient out to the hospitalist Dr. Bridgett Larsson.   Arta Silence, MD 09/28/17 1718

## 2017-09-29 ENCOUNTER — Other Ambulatory Visit: Payer: Self-pay

## 2017-09-29 LAB — BASIC METABOLIC PANEL
ANION GAP: 5 (ref 5–15)
BUN: 10 mg/dL (ref 6–20)
CALCIUM: 8.4 mg/dL — AB (ref 8.9–10.3)
CO2: 27 mmol/L (ref 22–32)
CREATININE: 0.48 mg/dL (ref 0.44–1.00)
Chloride: 107 mmol/L (ref 101–111)
GLUCOSE: 117 mg/dL — AB (ref 65–99)
Potassium: 3.9 mmol/L (ref 3.5–5.1)
Sodium: 139 mmol/L (ref 135–145)

## 2017-09-29 LAB — CBC
HCT: 42.1 % (ref 35.0–47.0)
HEMOGLOBIN: 14.2 g/dL (ref 12.0–16.0)
MCH: 30.5 pg (ref 26.0–34.0)
MCHC: 33.7 g/dL (ref 32.0–36.0)
MCV: 90.4 fL (ref 80.0–100.0)
PLATELETS: 163 10*3/uL (ref 150–440)
RBC: 4.66 MIL/uL (ref 3.80–5.20)
RDW: 14.6 % — ABNORMAL HIGH (ref 11.5–14.5)
WBC: 5.6 10*3/uL (ref 3.6–11.0)

## 2017-09-29 MED ORDER — PREDNISONE 10 MG PO TABS: ORAL_TABLET | ORAL | 0 refills | Status: DC

## 2017-09-29 MED ORDER — CEFUROXIME AXETIL 250 MG PO TABS: 250.0000 mg | ORAL_TABLET | Freq: Two times a day (BID) | ORAL | 0 refills | Status: DC

## 2017-09-29 MED ORDER — FLUTICASONE FUROATE-VILANTEROL 200-25 MCG/INH IN AEPB
1.0000 | INHALATION_SPRAY | Freq: Every day | RESPIRATORY_TRACT | Status: DC
  Administered 2017-09-29 – 2017-10-02 (×4): 1 via RESPIRATORY_TRACT
  Filled 2017-09-29: qty 28

## 2017-09-29 MED ORDER — LEVETIRACETAM 500 MG PO TABS: 500.0000 mg | ORAL_TABLET | Freq: Two times a day (BID) | ORAL | 5 refills | Status: DC

## 2017-09-29 NOTE — Plan of Care (Signed)
  Problem: Health Behavior/Discharge Planning: Goal: Ability to manage health-related needs will improve Outcome: Progressing   Problem: Clinical Measurements: Goal: Ability to maintain clinical measurements within normal limits will improve Outcome: Progressing Goal: Respiratory complications will improve Outcome: Progressing   Problem: Activity: Goal: Risk for activity intolerance will decrease Outcome: Progressing   Problem: Nutrition: Goal: Adequate nutrition will be maintained Outcome: Progressing   Problem: Coping: Goal: Level of anxiety will decrease Outcome: Progressing   Problem: Elimination: Goal: Will not experience complications related to bowel motility Outcome: Progressing   Problem: Pain Managment: Goal: General experience of comfort will improve Outcome: Progressing   Problem: Safety: Goal: Ability to remain free from injury will improve Outcome: Progressing   Problem: Skin Integrity: Goal: Risk for impaired skin integrity will decrease Outcome: Progressing   

## 2017-09-29 NOTE — Evaluation (Signed)
Physical Therapy Evaluation Patient Details Name: Susan [name] MRN: [id number] DOB: [birth year] Today's Date: 09/29/2017   History of Present Illness  Pt is a 77 y.o. female with a known history of colonic diverticulosis, COPD, ventral hernia, arthritis, peptic ulcer presented to the emergency room for dizziness.  Patient also felt lightheaded and dizzy when she woke up.  She also had foul-smelling urine and dysuria.  No history of loss of consciousness and syncope.  After she came to the emergency room the dizziness was better.  She also has some dry cough.  No nausea vomiting and diarrhea.  She was evaluated in the emergency room found to have urinary tract infection.  No complaints of any tingling, numbness in any part of the body.  She was started on IV antibiotics for urinary tract infection.  She was worked up with chest x-ray which showed a interstitial edema hence IV fluids were not given.  Assessment includes: vertigo, UTI, hypokalemia, and acute bronchitis with underlying interstitial lung disease.      Clinical Impression  Pt presented with mild deficits in gait and with significant deficits in activity tolerance.  Pt was functionally strong for short bursts of activity such as with bed mobility and transfers but fatigued quickly during amb requiring seated rest break between gait sessions with HR increasing from a baseline of 72 bpm to 106 bpm after walking 80 feet on 2LO2/min.  Pt was steady during amb without any LOB including during fairly rapid 180 deg turns with her RW despite complaints of feeling dizzy while ambulating.  Pt would benefit from HHPT services to address the above deficits along with supervision with mobility secondary to pt's complaints of dizziness.      Follow Up Recommendations Home health PT;Supervision for mobility/OOB    Equipment Recommendations  None recommended by PT    Recommendations for Other Services       Precautions / Restrictions  Precautions Precautions: Fall Restrictions Weight Bearing Restrictions: No      Mobility  Bed Mobility Overal bed mobility: Independent                Transfers Overall transfer level: Needs assistance Equipment used: Rolling walker (2 wheeled) Transfers: Sit to/from Stand Sit to Stand: Supervision         General transfer comment: Pt steady with very good eccentric and concentric control during transfers  Ambulation/Gait Ambulation/Gait assistance: Min guard Ambulation Distance (Feet): 80 Feet x 1, 60 Feet x 1 Assistive device: Rolling walker (2 wheeled) Gait Pattern/deviations: WFL(Within Functional Limits)     General Gait Details: Pt steady with amb with a RW including during rapid 180 deg turns without LOB; SpO2 96% and HR 106 bpm after amb on 2LO2/min compared to baseline of 96% adn 72 bpm.  Mod verbal cues given for amb closer to RW with upright posture.  Stairs            Wheelchair Mobility    Modified Rankin (Stroke Patients Only)       Balance Overall balance assessment: No apparent balance deficits (not formally assessed)                                           Pertinent Vitals/Pain Pain Assessment: No/denies pain    Home Living Family/patient expects to be discharged to:: Private residence Living Arrangements: Alone Available Help at Discharge: Friend(s);Available PRN/intermittently  Type of Home: Apartment Home Access: Elevator;Level entry     Home Layout: One level Home Equipment: Rossie - 2 wheels;Electric scooter      Prior Function Level of Independence: Independent with assistive device(s)         Comments: Pt mod Ind with amb limited community distances with a RW, uses electric scooter to get from her apt down the elevator to a ground floor room where she stores the scooter and then uses her RW to walk out to her car; Ind with ADLs, no fall history     Hand Dominance   Dominant Hand: Right     Extremity/Trunk Assessment   Upper Extremity Assessment Upper Extremity Assessment: Overall WFL for tasks assessed    Lower Extremity Assessment Lower Extremity Assessment: Overall WFL for tasks assessed       Communication   Communication: No difficulties  Cognition Arousal/Alertness: Awake/alert Behavior During Therapy: WFL for tasks assessed/performed Overall Cognitive Status: Within Functional Limits for tasks assessed                                        General Comments      Exercises Total Joint Exercises Ankle Circles/Pumps: AROM;Both;10 reps Quad Sets: Strengthening;Both;10 reps Hip ABduction/ADduction: AROM;Both;5 reps Straight Leg Raises: AROM;Both;5 reps Long Arc Quad: AROM;Both;10 reps;15 reps Knee Flexion: AROM;Both;10 reps;15 reps Marching in Standing: AROM;Both;10 reps   Assessment/Plan    PT Assessment Patient needs continued PT services  PT Problem List Decreased activity tolerance;Decreased knowledge of use of DME       PT Treatment Interventions DME instruction;Gait training;Balance training;Therapeutic exercise;Therapeutic activities;Patient/family education    PT Goals (Current goals can be found in the Care Plan section)  Acute Rehab PT Goals Patient Stated Goal: Better endurance and to not feel dizzy when I walk PT Goal Formulation: With patient Time For Goal Achievement: 10/12/17 Potential to Achieve Goals: Good    Frequency Min 2X/week   Barriers to discharge        Co-evaluation               AM-PAC PT "6 Clicks" Daily Activity  Outcome Measure Difficulty turning over in bed (including adjusting bedclothes, sheets and blankets)?: None Difficulty moving from lying on back to sitting on the side of the bed? : None Difficulty sitting down on and standing up from a chair with arms (e.g., wheelchair, bedside commode, etc,.)?: None Help needed moving to and from a bed to chair (including a wheelchair)?:  None Help needed walking in hospital room?: None Help needed climbing 3-5 steps with a railing? : A Little 6 Click Score: 23    End of Session Equipment Utilized During Treatment: Gait belt;Oxygen Activity Tolerance: Other (comment)(Limited by dizziness) Patient left: in chair;with call bell/phone within reach;with chair alarm set;with family/visitor present Nurse Communication: Mobility status PT Visit Diagnosis: Dizziness and giddiness (R42);Difficulty in walking, not elsewhere classified (R26.2)    Time: 1540-1620 PT Time Calculation (min) (ACUTE ONLY): 40 min   Charges:   PT Evaluation $PT Eval Low Complexity: 1 Low PT Treatments $Therapeutic Exercise: 8-22 mins   PT G Codes:        DRoyetta Asal PT, DPT 09/29/17, 4:46 PM

## 2017-09-29 NOTE — Progress Notes (Signed)
[place] at [place] NAME: Susan [name]    MR#:  [id number]  DATE OF BIRTH:  06-16-1940  SUBJECTIVE:   Admitted to the hospital due to persistent dizziness and noted to have urinary tract infection.  Patient still complaining of some dizziness.  Also having some minimal wheezing with underlying COPD.  REVIEW OF SYSTEMS:    Review of Systems  Constitutional: Negative for chills and fever.  HENT: Negative for congestion and tinnitus.   Eyes: Negative for blurred vision and double vision.  Respiratory: Positive for shortness of breath. Negative for cough and wheezing.   Cardiovascular: Negative for chest pain, orthopnea and PND.  Gastrointestinal: Negative for abdominal pain, diarrhea, nausea and vomiting.  Genitourinary: Negative for dysuria and hematuria.  Neurological: Positive for dizziness and weakness. Negative for sensory change and focal weakness.  All other systems reviewed and are negative.   Nutrition: Heart healthy Tolerating Diet: Yes Tolerating PT: Eval noted.   DRUG ALLERGIES:  No Known Allergies  VITALS:  Blood pressure (!) 162/77, pulse 71, temperature (!) 97.5 F (36.4 C), temperature source Oral, resp. rate 20, height 5' (1.524 m), weight 95.5 kg (210 lb 8 oz), SpO2 95 %.  PHYSICAL EXAMINATION:   Physical Exam  GENERAL:  77 y.o.-year-old patient lying in bed in no acute distress.  EYES: Pupils equal, round, reactive to light and accommodation. No scleral icterus. Extraocular muscles intact.  HEENT: Head atraumatic, normocephalic. Oropharynx and nasopharynx clear.  NECK:  Supple, no jugular venous distention. No thyroid enlargement, no tenderness.  LUNGS: Normal breath sounds bilaterally, end-exp wheezing b/l, No rales, rhonchi. No use of accessory muscles of respiration.  CARDIOVASCULAR: S1, S2 normal. No murmurs, rubs, or gallops.  ABDOMEN: Soft, nontender, nondistended. Bowel sounds present. No organomegaly or  mass.  EXTREMITIES: No cyanosis, clubbing or edema b/l.    NEUROLOGIC: Cranial nerves II through XII are intact. No focal Motor or sensory deficits b/l.  Dizzy.  PSYCHIATRIC: The patient is alert and oriented x 3.  SKIN: No obvious rash, lesion, or ulcer.    LABORATORY PANEL:   CBC Recent Labs  Lab 09/29/17 0418  WBC 5.6  HGB 14.2  HCT 42.1  PLT 163   ------------------------------------------------------------------------------------------------------------------  Chemistries  Recent Labs  Lab 09/29/17 0418  NA 139  K 3.9  CL 107  CO2 27  GLUCOSE 117*  BUN 10  CREATININE 0.48  CALCIUM 8.4*   ------------------------------------------------------------------------------------------------------------------  Cardiac Enzymes Recent Labs  Lab 09/28/17 1326  TROPONINI <0.03   ------------------------------------------------------------------------------------------------------------------  RADIOLOGY:  Dg Chest 2 View  Result Date: 09/28/2017 CLINICAL DATA:  Cough and shortness of breath EXAM: CHEST - 2 VIEW COMPARISON:  Chest radiograph 07/06/2017 FINDINGS: Shallow lung inflation with unchanged cardiomegaly. Diffusely increased interstitial opacity no pleural effusion or pneumothorax. No focal consolidation. Bilateral glenohumeral joint degenerative change. IMPRESSION: 1. Diffusely increased interstitial opacity may be secondary to chronic interstitial/bronchitic changes, but mild superimposed interstitial pulmonary edema would be difficult to exclude. 2. Mild cardiomegaly. Electronically Signed   By: Ulyses Jarred M.D.   On: 09/28/2017 13:59     ASSESSMENT AND PLAN:   77 year old female with past medical history of osteoarthritis, COPD, ventral hernia, peptic ulcer disease, history of diverticulosis who presented to the hospital due to dizziness and also noted to have a urinary tract infection.  1.  Dizziness-etiology unclear but suspected to be secondary to vertigo.   Continue meclizine, will check orthostatic vital signs. -I looked into her ears  no evidence of acute otitis media noted.  Follow-up with ENT for possible VAC removal as an outpatient.  2.  Urinary tract infection- based off a urinalysis on admission.  Clinically she is asymptomatic. -Continue IV Rocephin, follow cultures.  3.  Hypokalemia-improved with supplementation we will continue to monitor.  4.  COPD with mild acute exacerbation-continue Brio Ellipta, albuterol nebulizers as needed, duo nebs -Improving. -Continue empiric antibiotics as mentioned above.  5.  History of seizures-continue Keppra.  6.  Restless leg syndrome-continue Requip.  Physical therapy and the recommend home health with 24-hour supervision.  Will observe overnight and possible discharge home tomorrow.  All the records are reviewed and case discussed with Care Management/Social Worker. Management plans discussed with the patient, family and they are in agreement.  CODE STATUS: DNR  DVT Prophylaxis: Lovenox  TOTAL TIME TAKING CARE OF THIS PATIENT: 30 minutes.   POSSIBLE D/C IN1-2 DAYS, DEPENDING ON CLINICAL CONDITION.   Henreitta Leber M.D on 09/29/2017 at 4:30 PM  Between 7am to 6pm - Pager - [phone]  After 6pm go to [email or web address] - Proofreader  [facility]  Office  [phone]  CC: Primary care physician; [name], MD

## 2017-09-29 NOTE — Progress Notes (Signed)
Pharmacy Antibiotic Note  Susan [name] is a 77 y.o. female admitted on 09/28/2017 with UTI.  Pharmacy has been consulted for Ceftriaxone dosing.  Plan: Ceftriaxone 1g IV q24h  Height: 5' (152.4 cm) Weight: 210 lb 8 oz (95.5 kg) IBW/kg (Calculated) : 45.5  Temp (24hrs), Avg:97.9 F (36.6 C), Min:97.8 F (36.6 C), Max:98.1 F (36.7 C)  Recent Labs  Lab 09/28/17 1326 09/29/17 0418  WBC 5.2 5.6  CREATININE 0.52 0.48    Estimated Creatinine Clearance: 62.8 mL/min (by C-G formula based on SCr of 0.48 mg/dL).    No Known Allergies   No cultures available at this time Ceftriaxone 5/9 >>   Thank you for allowing pharmacy to be a part of this patient's care.  Rayna Sexton, PharmD, BCPS Clinical Pharmacist 09/29/2017 8:29 AM

## 2017-09-30 ENCOUNTER — Inpatient Hospital Stay: Payer: Medicare HMO

## 2017-09-30 MED ORDER — IPRATROPIUM-ALBUTEROL 0.5-2.5 (3) MG/3ML IN SOLN
3.0000 mL | Freq: Three times a day (TID) | RESPIRATORY_TRACT | Status: DC
  Administered 2017-09-30: 08:00:00 3 mL via RESPIRATORY_TRACT
  Filled 2017-09-30: qty 3

## 2017-09-30 MED ORDER — METHYLPREDNISOLONE SODIUM SUCC 125 MG IJ SOLR
60.0000 mg | INTRAMUSCULAR | Status: DC
  Administered 2017-09-30 – 2017-10-02 (×3): 60 mg via INTRAVENOUS
  Filled 2017-09-30 (×3): qty 2

## 2017-09-30 MED ORDER — IPRATROPIUM-ALBUTEROL 0.5-2.5 (3) MG/3ML IN SOLN
3.0000 mL | RESPIRATORY_TRACT | Status: DC
  Administered 2017-09-30 (×3): 3 mL via RESPIRATORY_TRACT
  Filled 2017-09-30 (×4): qty 3

## 2017-09-30 MED ORDER — HYDRALAZINE HCL 20 MG/ML IJ SOLN
25.0000 mg | Freq: Two times a day (BID) | INTRAMUSCULAR | Status: DC
  Administered 2017-09-30 – 2017-10-02 (×4): 25 mg via INTRAVENOUS
  Filled 2017-09-30 (×5): qty 2

## 2017-09-30 NOTE — Progress Notes (Signed)
[place] at [place] NAME: Susan [name]    MR#:  [id number]  DATE OF BIRTH:  Jun 03, 1940  SUBJECTIVE:   Admitted to the hospital due to persistent dizziness and noted to have urinary tract infection.  Dizziness improved but however has more wheezing today, BP also is high  REVIEW OF SYSTEMS:    Review of Systems  Constitutional: Negative for chills and fever.  HENT: Negative for congestion, hearing loss and tinnitus.   Eyes: Negative for blurred vision, double vision and photophobia.  Respiratory: Positive for shortness of breath. Negative for cough, hemoptysis and wheezing.   Cardiovascular: Negative for chest pain, palpitations, orthopnea, leg swelling and PND.  Gastrointestinal: Negative for abdominal pain, diarrhea, nausea and vomiting.  Genitourinary: Negative for dysuria, hematuria and urgency.  Musculoskeletal: Negative for myalgias and neck pain.  Skin: Negative for rash.  Neurological: Negative for dizziness, sensory change, focal weakness, seizures, weakness and headaches.  Psychiatric/Behavioral: Negative for memory loss. The patient does not have insomnia.   All other systems reviewed and are negative.   Nutrition: Heart healthy Tolerating Diet: Yes Tolerating PT: Eval noted.   DRUG ALLERGIES:  No Known Allergies  VITALS:  Blood pressure (!) 186/94, pulse 84, temperature (!) 97.5 F (36.4 C), temperature source Oral, resp. rate 20, height 5' (1.524 m), weight 95.5 kg (210 lb 8 oz), SpO2 94 %.  PHYSICAL EXAMINATION:   Physical Exam  GENERAL:  77 y.o.-year-old patient lying in bed in no acute distress.  EYES: Pupils equal, round, reactive to light and accommodation. No scleral icterus. Extraocular muscles intact.  HEENT: Head atraumatic, normocephalic. Oropharynx and nasopharynx clear.  NECK:  Supple, no jugular venous distention. No thyroid enlargement, no tenderness.  LUNGS:  patient has end expiratory wheeze  bilaterally.  Not using accessory muscles of respiration. CARDIOVASCULAR: S1, S2 normal. No murmurs, rubs, or gallops.  ABDOMEN: Soft, nontender, nondistended. Bowel sounds present. No organomegaly or mass.  EXTREMITIES: No cyanosis, clubbing or edema b/l.    NEUROLOGIC: Cranial nerves II through XII are intact. No focal Motor or sensory deficits b/l.  Dizzy.  PSYCHIATRIC: The patient is alert and oriented x 3.  SKIN: No obvious rash, lesion, or ulcer.    LABORATORY PANEL:   CBC Recent Labs  Lab 09/29/17 0418  WBC 5.6  HGB 14.2  HCT 42.1  PLT 163   ------------------------------------------------------------------------------------------------------------------  Chemistries  Recent Labs  Lab 09/29/17 0418  NA 139  K 3.9  CL 107  CO2 27  GLUCOSE 117*  BUN 10  CREATININE 0.48  CALCIUM 8.4*   ------------------------------------------------------------------------------------------------------------------  Cardiac Enzymes Recent Labs  Lab 09/28/17 1326  TROPONINI <0.03   ------------------------------------------------------------------------------------------------------------------  RADIOLOGY:  Dg Chest 2 View  Result Date: 09/28/2017 CLINICAL DATA:  Cough and shortness of breath EXAM: CHEST - 2 VIEW COMPARISON:  Chest radiograph 07/06/2017 FINDINGS: Shallow lung inflation with unchanged cardiomegaly. Diffusely increased interstitial opacity no pleural effusion or pneumothorax. No focal consolidation. Bilateral glenohumeral joint degenerative change. IMPRESSION: 1. Diffusely increased interstitial opacity may be secondary to chronic interstitial/bronchitic changes, but mild superimposed interstitial pulmonary edema would be difficult to exclude. 2. Mild cardiomegaly. Electronically Signed   By: Ulyses Jarred M.D.   On: 09/28/2017 13:59     ASSESSMENT AND PLAN:   77 year old female with past medical history of osteoarthritis, COPD, ventral hernia, peptic ulcer  disease, history of diverticulosis who presented to the hospital due to dizziness and also noted to have a urinary tract  infection.  1.  Dizziness-etiology unclear but suspected to be secondary to vertigo.  Continue meclizine, patient has no orthostatic hypotension.  Patient has no evidence of acute otitis media.   Follow-up with ENT for possible waxremoval as an outpatient.  Patient dizziness improved a lot.  Physical therapy recommended 24-hour home health.  She lives alone but she has friends and also children nearby.  2.  Urinary tract infection- based off a urinalysis on admission.  Clinically she is asymptomatic. -Continue IV Rocephin,urine cultures   3.  Hypokalemia-improved with supplementation we will continue to monitor.  4.   COPD exacerbation:  continue scheduled nebulizers, added IV steroids, patient has chronic respiratory failure, on oxygen 2 L. 5.  History of seizures-continue Keppra.  6.  Restless leg syndrome-continue Requip.  Physical therapy and the recommend home health with 24-hour supervision.  Patient has wheezing so I added steroids today, likely discharge home tomorrow.  She lives alone and she is short of breath today and does not feel safe to go home like this.  And I also feel that she need to stay at least another day on IV steroids. All the records are reviewed and case discussed with Care Management/Social Worker. Management plans discussed with the patient, family and they are in agreement.  CODE STATUS: DNR  DVT Prophylaxis: Lovenox  TOTAL TIME TAKING CARE OF THIS PATIENT: 30 minutes.   POSSIBLE D/C IN1-2 DAYS, DEPENDING ON CLINICAL CONDITION.   Epifanio Lesches M.D on 09/30/2017 at 10:12 AM  Between 7am to 6pm - Pager - [phone]  After 6pm go to [email or web address] - Proofreader  [facility]  Office  [phone]  CC: Primary care physician; [name], MD

## 2017-10-01 ENCOUNTER — Inpatient Hospital Stay: Payer: Medicare HMO

## 2017-10-01 MED ORDER — MECLIZINE HCL 25 MG PO TABS
25.0000 mg | ORAL_TABLET | Freq: Three times a day (TID) | ORAL | Status: DC
  Administered 2017-10-01 – 2017-10-02 (×3): 25 mg via ORAL
  Filled 2017-10-01 (×6): qty 1

## 2017-10-01 MED ORDER — HYDRALAZINE HCL 25 MG PO TABS: 25.0000 mg | ORAL_TABLET | Freq: Three times a day (TID) | ORAL | 0 refills | Status: DC

## 2017-10-01 MED ORDER — IPRATROPIUM-ALBUTEROL 0.5-2.5 (3) MG/3ML IN SOLN
3.0000 mL | RESPIRATORY_TRACT | Status: DC | PRN
Start: 2017-10-01 — End: 2017-10-02

## 2017-10-01 MED ORDER — IPRATROPIUM-ALBUTEROL 0.5-2.5 (3) MG/3ML IN SOLN
3.0000 mL | Freq: Four times a day (QID) | RESPIRATORY_TRACT | Status: DC
  Administered 2017-10-01 – 2017-10-02 (×5): 3 mL via RESPIRATORY_TRACT
  Filled 2017-10-01 (×6): qty 3

## 2017-10-01 NOTE — Progress Notes (Signed)
[facility] NAME: Susan [name]    MR#:  [id number]  DATE OF BIRTH:  01-11-1941  SUBJECTIVE: Charge canceled today because patient complains of vertigo coming back again and she feels like she would not want to go.  Earlier today she said she wanted to go but she again changed her mind and said she is really feels dizzy and does not want to go.  So I ordered ultrasound of carotids, continue meclizine instead of as needed change to standing dose.   Admitted to the hospital due to persistent dizziness and noted to have urinary tract infection.    REVIEW OF SYSTEMS:    Review of Systems  Constitutional: Negative for chills and fever.  HENT: Negative for congestion, hearing loss and tinnitus.   Eyes: Negative for blurred vision, double vision and photophobia.  Respiratory: Positive for shortness of breath. Negative for cough, hemoptysis and wheezing.   Cardiovascular: Negative for chest pain, palpitations, orthopnea, leg swelling and PND.  Gastrointestinal: Negative for abdominal pain, diarrhea, nausea and vomiting.  Genitourinary: Negative for dysuria, hematuria and urgency.  Musculoskeletal: Negative for myalgias and neck pain.  Skin: Negative for rash.  Neurological: Negative for dizziness, sensory change, focal weakness, seizures, weakness and headaches.  Psychiatric/Behavioral: Negative for memory loss. The patient does not have insomnia.   All other systems reviewed and are negative.   Nutrition: Heart healthy Tolerating Diet: Yes Tolerating PT: Eval noted.   DRUG ALLERGIES:  No Known Allergies  VITALS:  Blood pressure (!) 152/91, pulse 92, temperature 97.7 F (36.5 C), temperature source Oral, resp. rate (!) 24, height 5' (1.524 m), weight 95.5 kg (210 lb 8 oz), SpO2 97 %.  PHYSICAL EXAMINATION:   Physical Exam  GENERAL:  77 y.o.-year-old patient lying in bed in no acute distress.  EYES: Pupils equal, round, reactive to  light and accommodation. No scleral icterus. Extraocular muscles intact.  HEENT: Head atraumatic, normocephalic. Oropharynx and nasopharynx clear.  NECK:  Supple, no jugular venous distention. No thyroid enlargement, no tenderness.  LUNGS:  patient has end expiratory wheeze bilaterally.  Not using accessory muscles of respiration. CARDIOVASCULAR: S1, S2 normal. No murmurs, rubs, or gallops.  ABDOMEN: Soft, nontender, nondistended. Bowel sounds present. No organomegaly or mass.  EXTREMITIES: No cyanosis, clubbing or edema b/l.    NEUROLOGIC: Cranial nerves II through XII are intact. No focal Motor or sensory deficits b/l.  Dizzy.  PSYCHIATRIC: The patient is alert and oriented x 3.  SKIN: No obvious rash, lesion, or ulcer.    LABORATORY PANEL:   CBC Recent Labs  Lab 09/29/17 0418  WBC 5.6  HGB 14.2  HCT 42.1  PLT 163   ------------------------------------------------------------------------------------------------------------------  Chemistries  Recent Labs  Lab 09/29/17 0418  NA 139  K 3.9  CL 107  CO2 27  GLUCOSE 117*  BUN 10  CREATININE 0.48  CALCIUM 8.4*   ------------------------------------------------------------------------------------------------------------------  Cardiac Enzymes Recent Labs  Lab 09/28/17 1326  TROPONINI <0.03   ------------------------------------------------------------------------------------------------------------------  RADIOLOGY:  Dg Chest 1 View  Result Date: 09/30/2017 CLINICAL DATA:  Dizziness.  History of asthma.  Former smoker. EXAM: CHEST  1 VIEW COMPARISON:  09/28/2017; 07/06/2017; 03/27/2017 FINDINGS: Grossly unchanged cardiac silhouette and mediastinal contours with atherosclerotic plaque within thoracic aorta. Grossly unchanged diffuse nodular thickening of the pulmonary interstitium. No new focal airspace opacities. No pleural effusion or pneumothorax. No evidence of edema. No acute osseus abnormalities. Re demonstrated  severe degenerative change the bilateral glenohumeral  joints, right greater than left. IMPRESSION: Chronic bronchitic change without superimposed acute cardiopulmonary disease on this AP portable examination. Further evaluation with a PA and lateral chest radiograph may be obtained as clinically indicated. Electronically Signed   By: Sandi Mariscal M.D.   On: 09/30/2017 11:55   US Carotid Bilateral  Result Date: 10/01/2017 CLINICAL DATA:  77 year old female with a history of dizziness. Cardiovascular risk factors include known coronary disease and tobacco use EXAM: BILATERAL CAROTID DUPLEX ULTRASOUND TECHNIQUE: Pearline Cables scale imaging, color Doppler and duplex ultrasound were performed of bilateral carotid and vertebral arteries in the neck. COMPARISON:  07/07/2017 FINDINGS: Criteria: Quantification of carotid stenosis is based on velocity parameters that correlate the residual internal carotid diameter with NASCET-based stenosis levels, using the diameter of the distal internal carotid lumen as the denominator for stenosis measurement. The following velocity measurements were obtained: RIGHT ICA:  Systolic 694 cm/sec, Diastolic 29 cm/sec CCA:  854 cm/sec SYSTOLIC ICA/CCA RATIO:  1.0 ECA:  263 cm/sec LEFT ICA:  Systolic 80 627 cm/sec, Diastolic 19 cm/sec CCA:  035 cm/sec SYSTOLIC ICA/CCA RATIO:  1.3 ECA:  293 cm/sec Right Brachial SBP: Not acquired Left Brachial SBP: Not acquired RIGHT CAROTID ARTERY: No significant calcifications of the right common carotid artery. Intermediate waveform maintained. Moderate heterogeneous and partially calcified plaque at the right carotid bifurcation. No significant lumen shadowing. Low resistance waveform of the right ICA. Tortuosity RIGHT VERTEBRAL ARTERY: Antegrade flow with low resistance waveform. LEFT CAROTID ARTERY: No significant calcifications of the left common carotid artery. Intermediate waveform maintained. Moderate heterogeneous and partially calcified plaque at the left  carotid bifurcation. No significant lumen shadowing. Low resistance waveform of the left ICA. Tortuosity LEFT VERTEBRAL ARTERY:  Antegrade flow with low resistance waveform. IMPRESSION: Color duplex indicates moderate heterogeneous and calcified plaque, with no hemodynamically significant stenosis by duplex criteria in the extracranial cerebrovascular circulation. Signed, Dulcy Fanny. Earleen Newport, DO [facility] Electronically Signed   By: Corrie Mckusick D.O.   On: 10/01/2017 12:28     ASSESSMENT AND PLAN:   77 year old female with past medical history of osteoarthritis, COPD, ventral hernia, peptic ulcer disease, history of diverticulosis who presented to the hospital due to dizziness and also noted to have a urinary tract infection.  1.  Dizziness-etiology unclear but suspected to be secondary to vertigo.  Continue meclizine, ordered Debrox to the wax.  Cancel discharge, ordered ultrasound of carotids.,  2.  Urinary tract infection-  patient has Klebsiella UTI, E. coli UTI, waiting for sensitivity results.  Continue IV Rocephin.  3.  Hypokalemia-improved with supplementation we will continue to monitor.  4.   COPD exacerbation:  impproving, better than yesterday, chest x-ray is i is negative for pneumonia.  Continue IV steroids today while in the hospital..  History of seizures-continue Keppra.  6.  Restless leg syndrome-continue Requip.  Physical therapy and the recommend home health with 24-hour supervision.  Because of  persistent dizziness I ordered an ultrasound of carotids, canceled the discharge, continue IV Rocephin, follow urine cultures. All the records are reviewed and case discussed with Care Management/Social Worker. Management plans discussed with the patient, family and they are in agreement.  CODE STATUS: DNR  DVT Prophylaxis: Lovenox  TOTAL TIME TAKING CARE OF THIS PATIENT: 30 minutes.   POSSIBLE D/C IN1-2 DAYS, DEPENDING  ON CLINICAL CONDITION.   Epifanio Lesches M.D on 10/01/2017 at 1:42 PM  Between 7am to 6pm - Pager - [phone]  After 6pm go to [email or web address] -  password Airline pilot  [facility]  Office  [phone]  CC: Primary care physician; [name], MD

## 2017-10-01 NOTE — Care Management Note (Signed)
Case Management Note  Patient Details  Name: Susan [name] MRN: [id number] Date of Birth: 30-Aug-1940   Patient admitted with dizziness.  Patient lives at home alone.  Family lives locally for support.  Neighbor provides transportation.  PCP [facility].  Patient states that she has a RW, electric scooter, and O2 in the home.  RNCM confirmed with [facility] that patient does have continuous O2 in the home.  Daughter to bring O2 for transport home. Home health orders have been written.  Patient agreeable to services.  Patient states that she does not have a preference of home health agency.  Referral made to [facility] with [facility].  RNCM signing off   Subjective/Objective:                    Action/Plan:   Expected Discharge Date:  10/01/17               Expected Discharge Plan:  [facility]  In-House Referral:     Discharge planning Services  CM Consult  Post Acute Care Choice:  Home Health Choice offered to:  Patient  DME Arranged:    DME Agency:     HH Arranged:  RN, PT, OT, Nurse's Aide, Social Work [employer] Agency:  [employer]  Status of Service:  Completed, signed off  If discussed at [employer], dates discussed:    Additional Comments:  Beverly Sessions, RN 10/01/2017, 9:35 AM

## 2017-10-01 NOTE — Progress Notes (Signed)
Patient stable for discharge today.  Patient wheezing improved.  Patient is eager to go home.  Discharge instructions in the computer.  Discharge home with home health.  Patient dizziness also improved.  Patient needs tapering course of prednisone and continue 2 L of oxygen all the time that she is using.  Needs appointment with ENT as an outpatient.  Patient can follow-up with PCP and he can set that up.  Discharge home today when the home health is set up.  She says she has electric scooter and also rolling walker and bedside commode at home.  Case manager can make sure she has all the stuff that she needs at home.

## 2017-10-02 LAB — URINE CULTURE: Culture: >=100000 — AB

## 2017-10-02 MED ORDER — CIPROFLOXACIN HCL 500 MG PO TABS: 500.0000 mg | ORAL_TABLET | Freq: Two times a day (BID) | ORAL | Status: DC

## 2017-10-02 MED ORDER — MECLIZINE HCL 25 MG PO TABS: 25.0000 mg | ORAL_TABLET | Freq: Three times a day (TID) | ORAL | 0 refills | Status: DC

## 2017-10-02 MED ORDER — CIPROFLOXACIN HCL 500 MG PO TABS: 500.0000 mg | ORAL_TABLET | Freq: Two times a day (BID) | ORAL | 0 refills | Status: AC

## 2017-10-02 NOTE — Discharge Summary (Signed)
Susan [name], is a 77 y.o. female  DOB 07-20-1940  MRN [id number].  Admission date:  09/28/2017  Admitting Physician  Saundra Shelling, MD  Discharge Date:  10/02/2017   Primary MD  [name], MD  Recommendations for primary care physician for things to follow:   Follow-up with PCP in 1 week   Admission Diagnosis  Dysuria [R30.0] Bronchitis [J40] Urinary tract infection without hematuria, site unspecified [N39.0]   Discharge Diagnosis  Dysuria [R30.0] Bronchitis [J40] Urinary tract infection without hematuria, site unspecified [N39.0]    Active Problems:   Hypoxia      Past Medical History:  Diagnosis Date  . Acute peptic ulcer, unspecified site, with hemorrhage and perforation, without mention of obstruction   . Arthritis   . Asthma   . Balance problems    unsteady on feet at time  . Chronic airway obstruction, not elsewhere classified    on inhalers  . Diverticulosis of colon (without mention of hemorrhage)   . Hematochezia   . 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 Ventral hernia     Past Surgical History:  Procedure Laterality Date  . APPENDECTOMY    . BUNIONECTOMY Bilateral   . COSMETIC SURGERY  1970's   abdomen. Tummy Tuck  . FOOT SURGERY Bilateral dec 2015 right    left 3 yrs ago  . RIGHT/LEFT HEART CATH AND CORONARY ANGIOGRAPHY Bilateral 07/08/2016   Procedure: Right/Left Heart Cath and Coronary Angiography;  Surgeon: Minna Merritts, MD;  Location: [facility];  Service: Cardiovascular;  Laterality: Bilateral;  . TOTAL HIP ARTHROPLASTY  2007   left  . TOTAL HIP ARTHROPLASTY     bilateral   . TOTAL KNEE ARTHROPLASTY Right 08/08/2014   Procedure: RIGHT TOTAL KNEE ARTHROPLASTY WITH NAVIGATION;  Surgeon: Rod Can, MD;  Location: [facility];  Service: Orthopedics;   Laterality: Right;  . TOTAL KNEE ARTHROPLASTY Left 10/03/2014   Procedure: LEFT TOTAL KNEE ARTHROPLASTY;  Surgeon: Rod Can, MD;  Location: [facility];  Service: Orthopedics;  Laterality: Left;  . TUBAL LIGATION    . UMBILICAL HERNIA REPAIR     2016       History of present illness and  Hospital Course:     Kindly see H&P for history of present illness and admission details, please review complete Labs, Consult reports and Test reports for all details in brief  HPI  from the history and physical done on the day of admission 77 year old female patient came in because of her dizziness severe dizziness and admitted to medical service.   Hospital Course  #1 severe dizziness likely secondary to UTI:.  Did not have orthostatic hypotension.  Patient carotid ultrasound did not show any hemodynamically significant stenosis.  #2 E. coli, Klebsiella UTI: Getting giving Cipro 500 mg twice daily for 10 days. 2.  Hypokalemia improved with supplements 3.  COPD exacerbation: Patient had a lot of wheezing but improved with nebulizer, steroids.  Chest x-ray did not show any pneumonia.  Discharging her home with tapering course of prednisone.  Patient can continue her home dose inhalers. 4.  History of seizures: Continue Keppra. 5.  History of RLS: Continue Requip. Deconditioning: Physical therapy recommended home health physical therapy, 24-hour supervision.  We are getting her PT OT, nurse, nursing, social worker.   Discharge Condition: Stable the patient is eager to go home today.  Follow UP  Follow-up Information    [name], MD. Schedule an appointment as  soon as possible for a visit in 2 days.   Specialty:  Family Medicine Contact information: [place] [address] [phone]             Discharge Instructions  and  Discharge Medications      Allergies as of 10/02/2017   No Known Allergies     Medication List    TAKE these medications    albuterol 108 (90 Base) MCG/ACT inhaler Commonly known as:  PROVENTIL HFA;VENTOLIN HFA Inhale 2 puffs into the lungs every 6 (six) hours as needed for wheezing or shortness of breath. What changed:  Another medication with the same name was added. Make sure you understand how and when to take each.   albuterol 108 (90 Base) MCG/ACT inhaler Commonly known as:  PROVENTIL HFA;VENTOLIN HFA Inhale 2 puffs into the lungs every 6 (six) hours as needed for wheezing or shortness of breath. What changed:  You were already taking a medication with the same name, and this prescription was added. Make sure you understand how and when to take each.   budesonide-formoterol 160-4.5 MCG/ACT inhaler Commonly known as:  SYMBICORT TAKE 2 PUFFS INTO LUNGS TWICE A DAY What changed:    how much to take  how to take this  when to take this  additional instructions   ciprofloxacin 500 MG tablet Commonly known as:  CIPRO Take 1 tablet (500 mg total) by mouth 2 (two) times daily for 10 days.   hydrALAZINE 25 MG tablet Commonly known as:  APRESOLINE Take 1 tablet (25 mg total) by mouth 3 (three) times daily.   ibuprofen 200 MG tablet Commonly known as:  ADVIL,MOTRIN Take 600 mg by mouth every 6 (six) hours as needed for headache or moderate pain.   levETIRAcetam 500 MG tablet Commonly known as:  KEPPRA Take 1 tablet (500 mg total) by mouth 2 (two) times daily. What changed:    how much to take  when to take this   meclizine 25 MG tablet Commonly known as:  ANTIVERT Take 1 tablet (25 mg total) by mouth 3 (three) times daily.   Melatonin 10 MG Tabs Take 15 mg by mouth at bedtime.   predniSONE 10 MG tablet Commonly known as:  DELTASONE Label  & dispense according to the schedule below. 5 Pills PO for 1 day then, 4 Pills PO for 1 day, 3 Pills PO for 1 day, 2 Pills PO for 1 day, 1 Pill PO for 1 days then STOP.   rOPINIRole 3 MG tablet Commonly known as:  REQUIP Take 3 mg by mouth at  bedtime.   rOPINIRole 3 MG tablet Commonly known as:  REQUIP TAKE ONE TABLET BY MOUTH AT BEDTIME   tiotropium 18 MCG inhalation capsule Commonly known as:  SPIRIVA Place 1 capsule (18 mcg total) into inhaler and inhale daily.   traZODone 50 MG tablet Commonly known as:  DESYREL 1 AND 1/2 TABLETS BY MOUTH AT BEDTIME ASNEEDED FOR SLEEP What changed:  See the new instructions.         Diet and Activity recommendation: See Discharge Instructions above  Consults obtained -physical therapy Major procedures and Radiology Reports - PLEASE review detailed and final reports for all details, in brief -      Dg Chest 1 View  Result Date: 09/30/2017 CLINICAL DATA:  Dizziness.  History of asthma.  Former smoker. EXAM: CHEST  1 VIEW COMPARISON:  09/28/2017; 07/06/2017; 03/27/2017 FINDINGS: Grossly unchanged cardiac silhouette and mediastinal contours  with atherosclerotic plaque within thoracic aorta. Grossly unchanged diffuse nodular thickening of the pulmonary interstitium. No new focal airspace opacities. No pleural effusion or pneumothorax. No evidence of edema. No acute osseus abnormalities. Re demonstrated severe degenerative change the bilateral glenohumeral joints, right greater than left. IMPRESSION: Chronic bronchitic change without superimposed acute cardiopulmonary disease on this AP portable examination. Further evaluation with a PA and lateral chest radiograph may be obtained as clinically indicated. Electronically Signed   By: Sandi Mariscal M.D.   On: 09/30/2017 11:55   Dg Chest 2 View  Result Date: 09/28/2017 CLINICAL DATA:  Cough and shortness of breath EXAM: CHEST - 2 VIEW COMPARISON:  Chest radiograph 07/06/2017 FINDINGS: Shallow lung inflation with unchanged cardiomegaly. Diffusely increased interstitial opacity no pleural effusion or pneumothorax. No focal consolidation. Bilateral glenohumeral joint degenerative change. IMPRESSION: 1. Diffusely increased interstitial opacity may  be secondary to chronic interstitial/bronchitic changes, but mild superimposed interstitial pulmonary edema would be difficult to exclude. 2. Mild cardiomegaly. Electronically Signed   By: Ulyses Jarred M.D.   On: 09/28/2017 13:59   US Carotid Bilateral  Result Date: 10/01/2017 CLINICAL DATA:  77 year old female with a history of dizziness. Cardiovascular risk factors include known coronary disease and tobacco use EXAM: BILATERAL CAROTID DUPLEX ULTRASOUND TECHNIQUE: Pearline Cables scale imaging, color Doppler and duplex ultrasound were performed of bilateral carotid and vertebral arteries in the neck. COMPARISON:  07/07/2017 FINDINGS: Criteria: Quantification of carotid stenosis is based on velocity parameters that correlate the residual internal carotid diameter with NASCET-based stenosis levels, using the diameter of the distal internal carotid lumen as the denominator for stenosis measurement. The following velocity measurements were obtained: RIGHT ICA:  Systolic 235 cm/sec, Diastolic 29 cm/sec CCA:  573 cm/sec SYSTOLIC ICA/CCA RATIO:  1.0 ECA:  263 cm/sec LEFT ICA:  Systolic 80 220 cm/sec, Diastolic 19 cm/sec CCA:  254 cm/sec SYSTOLIC ICA/CCA RATIO:  1.3 ECA:  293 cm/sec Right Brachial SBP: Not acquired Left Brachial SBP: Not acquired RIGHT CAROTID ARTERY: No significant calcifications of the right common carotid artery. Intermediate waveform maintained. Moderate heterogeneous and partially calcified plaque at the right carotid bifurcation. No significant lumen shadowing. Low resistance waveform of the right ICA. Tortuosity RIGHT VERTEBRAL ARTERY: Antegrade flow with low resistance waveform. LEFT CAROTID ARTERY: No significant calcifications of the left common carotid artery. Intermediate waveform maintained. Moderate heterogeneous and partially calcified plaque at the left carotid bifurcation. No significant lumen shadowing. Low resistance waveform of the left ICA. Tortuosity LEFT VERTEBRAL ARTERY:  Antegrade flow  with low resistance waveform. IMPRESSION: Color duplex indicates moderate heterogeneous and calcified plaque, with no hemodynamically significant stenosis by duplex criteria in the extracranial cerebrovascular circulation. Signed, Dulcy Fanny. Earleen Newport, DO [facility] Electronically Signed   By: Corrie Mckusick D.O.   On: 10/01/2017 12:28    Micro Results     Recent Results (from the past 240 hour(s))  Urine Culture     Status: Abnormal   Collection Time: 09/28/17 12:53 PM  Result Value Ref Range Status   Specimen Description   Final    URINE, RANDOM Performed at [facility], [address] [place], [place] [address]    Special Requests   Final    NONE Performed at [facility], [address] [place], [place] [address]    Culture (A)  Final    >=100,000 COLONIES/mL KLEBSIELLA PNEUMONIAE >=100,000 COLONIES/mL ESCHERICHIA COLI    Report Status 10/02/2017 FINAL  Final   Organism ID, Bacteria  KLEBSIELLA PNEUMONIAE (A)  Final   Organism ID, Bacteria ESCHERICHIA COLI (A)  Final      Susceptibility   Escherichia coli - MIC*    AMPICILLIN 8 SENSITIVE Sensitive     CEFAZOLIN <=4 SENSITIVE Sensitive     CEFTRIAXONE <=1 SENSITIVE Sensitive     CIPROFLOXACIN <=0.25 SENSITIVE Sensitive     GENTAMICIN <=1 SENSITIVE Sensitive     IMIPENEM <=0.25 SENSITIVE Sensitive     NITROFURANTOIN <=16 SENSITIVE Sensitive     TRIMETH/SULFA <=20 SENSITIVE Sensitive     AMPICILLIN/SULBACTAM <=2 SENSITIVE Sensitive     PIP/TAZO <=4 SENSITIVE Sensitive     Extended ESBL NEGATIVE Sensitive     * >=100,000 COLONIES/mL ESCHERICHIA COLI   Klebsiella pneumoniae - MIC*    AMPICILLIN RESISTANT Resistant     CEFAZOLIN <=4 SENSITIVE Sensitive     CEFTRIAXONE <=1 SENSITIVE Sensitive     CIPROFLOXACIN <=0.25 SENSITIVE Sensitive     GENTAMICIN <=1 SENSITIVE Sensitive     IMIPENEM <=0.25 SENSITIVE Sensitive     NITROFURANTOIN 128 RESISTANT  Resistant     TRIMETH/SULFA <=20 SENSITIVE Sensitive     AMPICILLIN/SULBACTAM <=2 SENSITIVE Sensitive     PIP/TAZO <=4 SENSITIVE Sensitive     Extended ESBL NEGATIVE Sensitive     * >=100,000 COLONIES/mL KLEBSIELLA PNEUMONIAE       Today   Subjective:   Valborg Louro to stable, eager to go home.  No shortness of breath.  Dizziness improved.  Patient can continue meclizine as needed. Objective:   Blood pressure (!) 142/88, pulse (!) 104, temperature 97.6 F (36.4 C), temperature source Oral, resp. rate 18, height 5' (1.524 m), weight 95.5 kg (210 lb 8 oz), SpO2 94 %.   Intake/Output Summary (Last 24 hours) at 10/02/2017 1238 Last data filed at 10/02/2017 1022 Gross per 24 hour  Intake 960 ml  Output -  Net 960 ml    Exam Awake Alert, Oriented x 3, No new F.N deficits, Normal affect [place].AT,PERRAL Supple Neck,No JVD, No cervical lymphadenopathy appriciated.  Symmetrical Chest wall movement, Good air movement bilaterally, CTAB RRR,No Gallops,Rubs or new Murmurs, No Parasternal Heave +ve B.Sounds, Abd Soft, Non tender, No organomegaly appriciated, No rebound -guarding or rigidity. No Cyanosis, Clubbing or edema, No new Rash or bruise  Data Review   CBC w Diff:  Lab Results  Component Value Date   WBC 5.6 09/29/2017   HGB 14.2 09/29/2017   HGB 12.9 07/06/2016   HCT 42.1 09/29/2017   HCT 40.3 07/06/2016   PLT 163 09/29/2017   PLT 171 07/06/2016   LYMPHOPCT 19 09/28/2017   MONOPCT 10 09/28/2017   EOSPCT 5 09/28/2017   BASOPCT 0 09/28/2017    CMP:  Lab Results  Component Value Date   NA 139 09/29/2017   NA 144 07/06/2016   K 3.9 09/29/2017   CL 107 09/29/2017   CO2 27 09/29/2017   BUN 10 09/29/2017   BUN 18 07/06/2016   CREATININE 0.48 09/29/2017   PROT 7.1 07/06/2017   ALBUMIN 3.8 07/06/2017   BILITOT 0.6 07/06/2017   ALKPHOS 82 07/06/2017   AST 33 07/06/2017   ALT 31 07/06/2017  .   Total Time in preparing paper work, data evaluation and todays exam  - 35 minutes  Epifanio Lesches M.D on 10/02/2017 at 12:38 PM    Note: This dictation was prepared with Dragon dictation along with smaller phrase technology. Any transcriptional errors that result from this process are unintentional.

## 2017-10-02 NOTE — Care Management Note (Signed)
Case Management Note  Patient Details  Name: Susan [name] MRN: [id number] Date of Birth: Jul 03, 1940  Subjective/Objective:   Discharging today                 Action/Plan: [facility] notified of discharge. Home health orders in.   Expected Discharge Date:  10/01/17               Expected Discharge Plan:  [place]  In-House Referral:     Discharge planning Services  CM Consult  Post Acute Care Choice:  Home Health Choice offered to:  Patient  DME Arranged:    DME Agency:     HH Arranged:  RN, PT, OT, Nurse's Aide, Social Work [employer] Agency:  [employer]  Status of Service:  Completed, signed off  If discussed at [employer], dates discussed:    Additional Comments:  Jolly Mango, RN 10/02/2017, 12:02 PM

## 2017-10-02 NOTE — Care Management Important Message (Signed)
Copy of signed IM left in patient's room.    

## 2017-10-02 NOTE — Discharge Planning (Signed)
Patient  IV removed. RN assessment and VS revealed stability for DC to home.  Discharge papers given, explained and educated.  Informed of suggested FU appt and appts made.  Scripts printed and also e-scribed to pharm.  Once ready, will be wheeled to front and family transporting home via car.

## 2017-10-03 ENCOUNTER — Telehealth: Payer: Self-pay | Admitting: *Deleted

## 2017-10-03 DIAGNOSIS — J209 Acute bronchitis, unspecified: Secondary | ICD-10-CM | POA: Diagnosis not present

## 2017-10-03 DIAGNOSIS — J849 Interstitial pulmonary disease, unspecified: Secondary | ICD-10-CM | POA: Diagnosis not present

## 2017-10-03 DIAGNOSIS — K5790 Diverticulosis of intestine, part unspecified, without perforation or abscess without bleeding: Secondary | ICD-10-CM | POA: Diagnosis not present

## 2017-10-03 DIAGNOSIS — N390 Urinary tract infection, site not specified: Secondary | ICD-10-CM | POA: Diagnosis not present

## 2017-10-03 DIAGNOSIS — K279 Peptic ulcer, site unspecified, unspecified as acute or chronic, without hemorrhage or perforation: Secondary | ICD-10-CM | POA: Diagnosis not present

## 2017-10-03 DIAGNOSIS — I679 Cerebrovascular disease, unspecified: Secondary | ICD-10-CM | POA: Diagnosis not present

## 2017-10-03 DIAGNOSIS — M1990 Unspecified osteoarthritis, unspecified site: Secondary | ICD-10-CM | POA: Diagnosis not present

## 2017-10-03 DIAGNOSIS — K439 Ventral hernia without obstruction or gangrene: Secondary | ICD-10-CM | POA: Diagnosis not present

## 2017-10-03 DIAGNOSIS — J432 Centrilobular emphysema: Secondary | ICD-10-CM | POA: Diagnosis not present

## 2017-10-03 DIAGNOSIS — R69 Illness, unspecified: Secondary | ICD-10-CM | POA: Diagnosis not present

## 2017-10-03 NOTE — Telephone Encounter (Signed)
Lm requesting return call to complete TCM and confirm hosp f/u appt  

## 2017-10-04 ENCOUNTER — Encounter: Payer: Self-pay | Admitting: *Deleted

## 2017-10-04 DIAGNOSIS — J432 Centrilobular emphysema: Secondary | ICD-10-CM | POA: Diagnosis not present

## 2017-10-04 DIAGNOSIS — J209 Acute bronchitis, unspecified: Secondary | ICD-10-CM | POA: Diagnosis not present

## 2017-10-04 DIAGNOSIS — K5790 Diverticulosis of intestine, part unspecified, without perforation or abscess without bleeding: Secondary | ICD-10-CM | POA: Diagnosis not present

## 2017-10-04 DIAGNOSIS — M1990 Unspecified osteoarthritis, unspecified site: Secondary | ICD-10-CM | POA: Diagnosis not present

## 2017-10-04 DIAGNOSIS — K439 Ventral hernia without obstruction or gangrene: Secondary | ICD-10-CM | POA: Diagnosis not present

## 2017-10-04 DIAGNOSIS — J849 Interstitial pulmonary disease, unspecified: Secondary | ICD-10-CM | POA: Diagnosis not present

## 2017-10-04 DIAGNOSIS — K279 Peptic ulcer, site unspecified, unspecified as acute or chronic, without hemorrhage or perforation: Secondary | ICD-10-CM | POA: Diagnosis not present

## 2017-10-04 DIAGNOSIS — R69 Illness, unspecified: Secondary | ICD-10-CM | POA: Diagnosis not present

## 2017-10-04 DIAGNOSIS — I679 Cerebrovascular disease, unspecified: Secondary | ICD-10-CM | POA: Diagnosis not present

## 2017-10-04 DIAGNOSIS — N390 Urinary tract infection, site not specified: Secondary | ICD-10-CM | POA: Diagnosis not present

## 2017-10-04 NOTE — Telephone Encounter (Signed)
Transition Care Management Follow-up Telephone Call   Date discharged? 10/02/2017   How have you been since you were released from the hospital? "ok but dizzy"   Do you understand why you were in the hospital? yes   Do you understand the discharge instructions? yes   Where were you discharged to? home   Items Reviewed:  Medications reviewed: yes  Allergies reviewed: yes   Functional Questionnaire:   Activities of Daily Living (ADLs):   She states they are independent in the following: independent in all areas    Any transportation issues/concerns?: no   Any patient concerns? no   Confirmed importance and date/time of follow-up visits scheduled yes  Provider Appointment booked with Dr Diona Browner 5/16 @ 1100  Confirmed with patient if condition begins to worsen call PCP or go to the ER.  Patient was given the office number and encouraged to call back with question or concerns.  : yes

## 2017-10-05 ENCOUNTER — Encounter: Payer: Self-pay | Admitting: Family Medicine

## 2017-10-05 ENCOUNTER — Other Ambulatory Visit: Payer: Self-pay | Admitting: *Deleted

## 2017-10-05 ENCOUNTER — Ambulatory Visit (INDEPENDENT_AMBULATORY_CARE_PROVIDER_SITE_OTHER): Payer: Medicare HMO | Admitting: Family Medicine

## 2017-10-05 VITALS — BP 136/68 | HR 85 | Temp 98.3°F | Ht 60.0 in | Wt 220.0 lb

## 2017-10-05 DIAGNOSIS — J849 Interstitial pulmonary disease, unspecified: Secondary | ICD-10-CM | POA: Diagnosis not present

## 2017-10-05 DIAGNOSIS — R569 Unspecified convulsions: Secondary | ICD-10-CM | POA: Diagnosis not present

## 2017-10-05 DIAGNOSIS — R55 Syncope and collapse: Secondary | ICD-10-CM

## 2017-10-05 DIAGNOSIS — N3001 Acute cystitis with hematuria: Secondary | ICD-10-CM | POA: Diagnosis not present

## 2017-10-05 NOTE — Patient Instructions (Addendum)
Call  Dr. Donivan Scull office to set up appt for presyncope. ASAP Make sure to keep follow up with Dr. Manuella Ghazi... Make sure to start taking medication he started you on.  Consider calling Dr. Manuella Ghazi to let him know about recent symptoms of presyncope continuing..  Go to ER if  Further severe chest pain or  passing out.

## 2017-10-05 NOTE — Progress Notes (Signed)
Subjective:    Patient ID: Susan [name], female    DOB: 12-09-1940, 77 y.o.   MRN: [id number]  HPI   77 year old female presents for hospital follow up.  Admitted on 5/9 for hypoxia from bronchitis and found to have UTI as well.  Hx of ILD followed by Dr.    [place]  Summary is as follows: Hospital Course  #1 severe dizziness likely secondary to UTI:.  Did not have orthostatic hypotension.  Patient carotid ultrasound did not show any hemodynamically significant stenosis. #2 E. coli, Klebsiella UTI: Getting giving Cipro 500 mg twice daily for 10 days. 2.  Hypokalemia improved with supplements. 3.  COPD exacerbation: Patient had a lot of wheezing but improved with nebulizer, steroids.  Chest x-ray did not show any pneumonia.  Discharging her home with tapering course of prednisone.  Patient can continue her home dose inhalers. 4.  History of seizures: Continue Keppra. 5.  History of RLS: Continue Requip. Deconditioning: Physical therapy recommended home health physical therapy, 24-hour supervision.  We are getting her PT OT, nurse, nursing, social worker.   Today she reports she was feeling better until this AM. Breathing improved.  No dysuria.  She got up feeling well today, was getting breakfast ready, showered.. During shower.. She had sudden onset presyncopal.. IMproved some with lying down but stayed weak.  Lasted 30 min then went away.  Felt heaviness in chest ( lasted minutes), no palpitations, no change in breathing, was wearing oxygen.  Called EMS.  No slurred speech, no neuro changes.   EKG reviewed from EMD.. PVCs.  She is on chronic oxygen.   She is still on cipro day 6/10. She has been eating and drinking some fluids but not much.  No bleeding.   Has cardiologist but has not seen in over a year.   Saw Dr. Manuella Ghazi 08/08/2017.Marland Kitchen MRI brain 07/06/2017, which showed moderate microvascular ischemic changes and multiple micro hemorrhages concerning for amyloid  angiopathy EEG done.. Told to take antiepileptic for possible seizure.. She has not been taking.  Follow up 10/2017  Social History /ewed in detail and updated in EMR if needed. Blood pressure 136/68, pulse 85, temperature 98.3 F (36.8 C), temperature source Oral, height 5' (1.524 m), weight 220 lb (99.8 kg), SpO2 96 %.  Review of Systems  Constitutional: Positive for fatigue. Negative for fever.  HENT: Negative for congestion.   Eyes: Negative for pain.  Respiratory: Positive for cough, chest tightness and shortness of breath.   Cardiovascular: Negative for chest pain, palpitations and leg swelling.  Gastrointestinal: Negative for abdominal pain.  Genitourinary: Negative for dysuria and vaginal bleeding.  Musculoskeletal: Negative for back pain.  Neurological: Positive for light-headedness. Negative for syncope and headaches.  Psychiatric/Behavioral: Negative for dysphoric mood.       Objective:   Physical Exam  Constitutional: Vital signs are normal. She appears well-developed and well-nourished. She is cooperative.  Non-toxic appearance. She does not appear ill. No distress.  In wheelchair, unkempt appearing which is very differnet for her  HENT:  Head: Normocephalic.  Right Ear: Hearing, tympanic membrane, external ear and ear canal normal. Tympanic membrane is not erythematous, not retracted and not bulging.  Left Ear: Hearing, tympanic membrane, external ear and ear canal normal. Tympanic membrane is not erythematous, not retracted and not bulging.  Nose: No mucosal edema or rhinorrhea. Right sinus exhibits no maxillary sinus tenderness and no frontal sinus tenderness. Left sinus exhibits no maxillary sinus tenderness and no frontal sinus  tenderness.  Mouth/Throat: Uvula is midline, oropharynx is clear and moist and mucous membranes are normal.  Eyes: Pupils are equal, round, and reactive to light. Conjunctivae, EOM and lids are normal. Lids are everted and swept, no foreign  bodies found.  Neck: Trachea normal and normal range of motion. Neck supple. Carotid bruit is not present. No thyroid mass and no thyromegaly present.  Cardiovascular: Normal rate, regular rhythm, S1 normal, S2 normal, normal heart sounds, intact distal pulses and normal pulses. Exam reveals no gallop and no friction rub.  No murmur heard. Pulmonary/Chest: Effort normal and breath sounds normal. No tachypnea. No respiratory distress. She has no decreased breath sounds. She has no wheezes. She has no rhonchi. She has no rales.  Abdominal: Soft. Normal appearance and bowel sounds are normal. There is no tenderness.  Neurological: She is alert.  Skin: Skin is warm, dry and intact. No rash noted.  Psychiatric: Her speech is normal and behavior is normal. Judgment and thought content normal. Her mood appears not anxious. Cognition and memory are normal. She does not exhibit a depressed mood.          Assessment & Plan:

## 2017-10-06 ENCOUNTER — Telehealth: Payer: Self-pay | Admitting: *Deleted

## 2017-10-06 DIAGNOSIS — N390 Urinary tract infection, site not specified: Secondary | ICD-10-CM | POA: Diagnosis not present

## 2017-10-06 DIAGNOSIS — K279 Peptic ulcer, site unspecified, unspecified as acute or chronic, without hemorrhage or perforation: Secondary | ICD-10-CM | POA: Diagnosis not present

## 2017-10-06 DIAGNOSIS — J432 Centrilobular emphysema: Secondary | ICD-10-CM | POA: Diagnosis not present

## 2017-10-06 DIAGNOSIS — R69 Illness, unspecified: Secondary | ICD-10-CM | POA: Diagnosis not present

## 2017-10-06 DIAGNOSIS — K5790 Diverticulosis of intestine, part unspecified, without perforation or abscess without bleeding: Secondary | ICD-10-CM | POA: Diagnosis not present

## 2017-10-06 DIAGNOSIS — J849 Interstitial pulmonary disease, unspecified: Secondary | ICD-10-CM | POA: Diagnosis not present

## 2017-10-06 DIAGNOSIS — M1990 Unspecified osteoarthritis, unspecified site: Secondary | ICD-10-CM | POA: Diagnosis not present

## 2017-10-06 DIAGNOSIS — K439 Ventral hernia without obstruction or gangrene: Secondary | ICD-10-CM | POA: Diagnosis not present

## 2017-10-06 DIAGNOSIS — J209 Acute bronchitis, unspecified: Secondary | ICD-10-CM | POA: Diagnosis not present

## 2017-10-06 DIAGNOSIS — I679 Cerebrovascular disease, unspecified: Secondary | ICD-10-CM | POA: Diagnosis not present

## 2017-10-06 NOTE — Telephone Encounter (Signed)
-----   Message from [name], MD sent at 10/05/2017  5:11 PM EDT -----  Susan [name] has requested a smaller oxygen tank. How do I set that up? Can you as the DME people? What does rx need to say? Appeared she had a portable one in office today but it was still to large for her to carry easily. Thanks!

## 2017-10-06 NOTE — Telephone Encounter (Signed)
Spoke with [facility].  They state they had an appointment set up with Ms. Virag to go out to evaluate for a portable oxygen system but patient cancelled appointment and said she would call back to reschedule but they have not heard back from her.  I advised them that Ms. Schuff did just get out of the hospital.  [facility] will have their respiratory department reach out to patient again to try and reschedule evaluation.

## 2017-10-09 DIAGNOSIS — K279 Peptic ulcer, site unspecified, unspecified as acute or chronic, without hemorrhage or perforation: Secondary | ICD-10-CM | POA: Diagnosis not present

## 2017-10-09 DIAGNOSIS — K5790 Diverticulosis of intestine, part unspecified, without perforation or abscess without bleeding: Secondary | ICD-10-CM | POA: Diagnosis not present

## 2017-10-09 DIAGNOSIS — K439 Ventral hernia without obstruction or gangrene: Secondary | ICD-10-CM | POA: Diagnosis not present

## 2017-10-09 DIAGNOSIS — M1990 Unspecified osteoarthritis, unspecified site: Secondary | ICD-10-CM | POA: Diagnosis not present

## 2017-10-09 DIAGNOSIS — N390 Urinary tract infection, site not specified: Secondary | ICD-10-CM | POA: Diagnosis not present

## 2017-10-09 DIAGNOSIS — R69 Illness, unspecified: Secondary | ICD-10-CM | POA: Diagnosis not present

## 2017-10-09 DIAGNOSIS — J432 Centrilobular emphysema: Secondary | ICD-10-CM | POA: Diagnosis not present

## 2017-10-09 DIAGNOSIS — J209 Acute bronchitis, unspecified: Secondary | ICD-10-CM | POA: Diagnosis not present

## 2017-10-09 DIAGNOSIS — I679 Cerebrovascular disease, unspecified: Secondary | ICD-10-CM | POA: Diagnosis not present

## 2017-10-09 DIAGNOSIS — J849 Interstitial pulmonary disease, unspecified: Secondary | ICD-10-CM | POA: Diagnosis not present

## 2017-10-10 ENCOUNTER — Ambulatory Visit: Payer: Medicare HMO | Admitting: Nurse Practitioner

## 2017-10-10 ENCOUNTER — Encounter: Payer: Self-pay | Admitting: Nurse Practitioner

## 2017-10-10 VITALS — BP 120/70 | HR 98 | Ht 60.0 in | Wt 213.0 lb

## 2017-10-10 DIAGNOSIS — K5790 Diverticulosis of intestine, part unspecified, without perforation or abscess without bleeding: Secondary | ICD-10-CM | POA: Diagnosis not present

## 2017-10-10 DIAGNOSIS — K279 Peptic ulcer, site unspecified, unspecified as acute or chronic, without hemorrhage or perforation: Secondary | ICD-10-CM | POA: Diagnosis not present

## 2017-10-10 DIAGNOSIS — I10 Essential (primary) hypertension: Secondary | ICD-10-CM

## 2017-10-10 DIAGNOSIS — N390 Urinary tract infection, site not specified: Secondary | ICD-10-CM | POA: Diagnosis not present

## 2017-10-10 DIAGNOSIS — I679 Cerebrovascular disease, unspecified: Secondary | ICD-10-CM | POA: Diagnosis not present

## 2017-10-10 DIAGNOSIS — R0789 Other chest pain: Secondary | ICD-10-CM

## 2017-10-10 DIAGNOSIS — J849 Interstitial pulmonary disease, unspecified: Secondary | ICD-10-CM | POA: Diagnosis not present

## 2017-10-10 DIAGNOSIS — M1990 Unspecified osteoarthritis, unspecified site: Secondary | ICD-10-CM | POA: Diagnosis not present

## 2017-10-10 DIAGNOSIS — J209 Acute bronchitis, unspecified: Secondary | ICD-10-CM | POA: Diagnosis not present

## 2017-10-10 DIAGNOSIS — J432 Centrilobular emphysema: Secondary | ICD-10-CM | POA: Diagnosis not present

## 2017-10-10 DIAGNOSIS — K439 Ventral hernia without obstruction or gangrene: Secondary | ICD-10-CM | POA: Diagnosis not present

## 2017-10-10 DIAGNOSIS — R69 Illness, unspecified: Secondary | ICD-10-CM | POA: Diagnosis not present

## 2017-10-10 MED ORDER — DILTIAZEM HCL ER COATED BEADS 180 MG PO CP24: 180.0000 mg | ORAL_CAPSULE | Freq: Every day | ORAL | 3 refills | Status: DC

## 2017-10-10 NOTE — Patient Instructions (Addendum)
Medication Instructions: - Your physician has recommended you make the following change in your medication:  1) STOP hydralazine 2) START diltiazem 180 mg- take 1 capsule by mouth once daily  Labwork: - none ordered  Procedures/Testing: - none ordered  Follow-Up: - Your physician recommends that you schedule a follow-up appointment in: 3 months with Dr. Rockey Situ.  If you need a refill on your cardiac medications before your next appointment, please call your pharmacy.

## 2017-10-10 NOTE — Progress Notes (Signed)
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  Past Medical History    Past Medical History:  Diagnosis Date  . Acute peptic ulcer, unspecified site, with hemorrhage and perforation, without mention of obstruction   . Arthritis   . Asthma   . Balance problems    unsteady on feet at time  . Cerebral amyloid angiopathy ([place])    a. 06/2017 admit w/ aphasia and blurred vision.  RI w/ extensive chronic microhemorrhages c/w amyloid angiopathy-->ASA initiated.  . Chronic airway obstruction, not elsewhere classified    on inhalers  . Diastolic dysfunction    a. 06/2017 Echo: EF 50-55%, no rwma, Gr1 DD, nl LA size, nl RV fxn.  . Diverticulosis of colon (without mention of hemorrhage)   . Hematochezia   . 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 Morbid obesity ([place])   . Non-obstructive CAD (coronary artery disease)    a. 12/2012 MV: EF 71%, no ischemia; b. 06/2016 Cath: LM nl, LAD 50p, LCX nl, RCA 9m, EF 55-65%.  . OSA (obstructive sleep apnea)   . Osteoarthritis    a. s/p bilat hip replacements.  Marland Kitchen UTI (urinary tract infection)    a. 09/2017 admit for E coli & Klebsiella UTI.  . Ventral hernia    Past Surgical History:  Procedure Laterality Date  . APPENDECTOMY    . BUNIONECTOMY Bilateral   . COSMETIC SURGERY  1970's   abdomen. Tummy Tuck  . FOOT SURGERY Bilateral dec 2015 right    left 3 yrs ago  . RIGHT/LEFT HEART CATH AND CORONARY ANGIOGRAPHY Bilateral 07/08/2016   Procedure: Right/Left Heart Cath and Coronary Angiography;  Surgeon: Minna Merritts, MD;  Location: [facility];  Service: Cardiovascular;  Laterality: Bilateral;  . TOTAL HIP ARTHROPLASTY   2007   left  . TOTAL HIP ARTHROPLASTY     bilateral   . TOTAL KNEE ARTHROPLASTY Right 08/08/2014   Procedure: RIGHT TOTAL KNEE ARTHROPLASTY WITH NAVIGATION;  Surgeon: Rod Can, MD;  Location: [facility];  Service: Orthopedics;  Laterality: Right;  . TOTAL KNEE ARTHROPLASTY Left 10/03/2014   Procedure: LEFT TOTAL KNEE ARTHROPLASTY;  Surgeon: Rod Can, MD;  Location: [facility];  Service: Orthopedics;  Laterality: Left;  . TUBAL LIGATION    . 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  Cardiac history dates back to at least 2014, will when she underwent stress testing which was negative.  In the setting of chronic dyspnea and intermittent chest pain, she underwent diagnostic catheterization March 2018 which showed nonobstructive disease.  She does have diastolic dysfunction with echocardiogram in February 2019 showing an EF of 50 to 55%.  That was performed in the setting of admission for blurred vision and aphasia.  Carotid ultrasound at that time did not show any significant disease.  MRI of the brain showed extensive chronic microhemorrhages consistent with amyloid angiopathy and she was advised to take aspirin.  She has not been  compliant with this.  She was most recently hospitalized in the setting of UTI and bronchitis flare.  From a cardiac standpoint, she says she has been doing reasonably well.  Her daughter is with her today and says that they recently called EMS because she was having profound head pressure.  When EMS arrived, she reported some chest discomfort but this apparently resolved and she did not require hospital visit.  She has had no recurrence of this.  She is not sure what happened that day.  Both she and her daughter would like to switch from  hydralazine to an alternate antihypertensive.  Hydralazine was started during admission for UTI in the setting of elevated blood pressures.  She had not been on antihypertensives prior to that.  Patient has chronic dyspnea on exertion and mostly uses a wheelchair or scooter to get around.  She has not been having any chest pain, PND, orthopnea, dizziness, syncope, edema, or early satiety.  Home Medications    Prior to Admission medications   Medication Sig Start Date End Date Taking? Authorizing Provider  albuterol (PROVENTIL HFA;VENTOLIN HFA) 108 (90 Base) MCG/ACT inhaler Inhale 2 puffs into the lungs every 6 (six) hours as needed for wheezing or shortness of breath. 09/28/17  Yes Veronese, [place], MD  budesonide-formoterol [facility]) 160-4.5 MCG/ACT inhaler TAKE 2 PUFFS INTO LUNGS TWICE A DAY Patient taking differently: Inhale 2 puffs into the lungs 2 (two) times daily.  04/24/17  Yes Bedsole, Amy E, MD  ciprofloxacin (CIPRO) 500 MG tablet Take 1 tablet (500 mg total) by mouth 2 (two) times daily for 10 days. 10/02/17 10/12/17 Yes Epifanio Lesches, MD  hydrALAZINE (APRESOLINE) 25 MG tablet Take 1 tablet (25 mg total) by mouth 3 (three) times daily. 10/01/17 10/01/18 Yes Epifanio Lesches, MD  ibuprofen (ADVIL,MOTRIN) 200 MG tablet Take 600 mg by mouth every 6 (six) hours as needed for headache or moderate pain.    Yes [provider]  meclizine (ANTIVERT) 25 MG tablet Take 1 tablet (25 mg total) by mouth 3 (three) times daily. 10/02/17  Yes Epifanio Lesches, MD  Melatonin 10 MG TABS Take 15 mg by mouth at bedtime.   Yes [provider]  OXYGEN Inhale 2 L into the lungs daily.   Yes [provider]  rOPINIRole (REQUIP) 3 MG tablet Take 3 mg by mouth at bedtime.   Yes [provider]  tiotropium (SPIRIVA) 18 MCG inhalation capsule Place 1 capsule (18 mcg total) into inhaler and inhale daily. 08/11/17  Yes Bedsole, Amy E, MD  traZODone (DESYREL) 50 MG tablet 1  AND 1/2 TABLETS BY MOUTH AT BEDTIME ASNEEDED FOR SLEEP Patient taking differently: TAKE 75MG  BY MOUTH AT BEDTIME ASNEEDED FOR SLEEP 09/05/17  Yes Bedsole, Amy E, MD    Review of Systems    Episode of head pressure/headache and chest pressure a few weeks ago.  Chronic dyspnea on exertion.  She denies dizziness, syncope, edema, or early satiety, palpitations.  All other systems reviewed and are otherwise negative except as noted above.  Physical Exam    VS:  BP 120/70 (BP Location: Left Arm, Patient Position: Sitting, Cuff Size: Normal)   Pulse 98   Ht 5' (1.524 m)   Wt 213 lb (96.6 kg)   BMI 41.60 kg/m  , BMI Body mass index is 41.6 kg/m. GEN: Well nourished, well developed, in no acute distress.  HEENT: normal.  Neck: Supple, no JVD, carotid bruits, or masses. Cardiac: RRR, occasional ectopy, no murmurs, rubs, or  gallops. No clubbing, cyanosis, edema.  Radials/DP/PT 2+ and equal bilaterally.  Respiratory:  Respirations regular and unlabored, clear to auscultation bilaterally. GI: Soft, nontender, nondistended, BS + x 4. MS: no deformity or atrophy. Skin: warm and dry, no rash. Neuro:  Strength and sensation are intact. Psych: Normal affect.  Accessory Clinical Findings    ECG - regular sinus rhythm, 98, frequent PACs, left axis deviation, left anterior fascicular block, possible septal infarct  Assessment & Plan    1.  Essential hypertension: Patient noted to have elevated blood pressures during recent hospitalization.  I reviewed rounding notes and vital signs.  She was discharged home on hydralazine 25 mg 3 times daily.  Patient has a history of noncompliance per daughter and has been difficult to get her to take this regularly.  I agree that this is not ideal.  With chronically elevated heart rate in the setting of sinus rhythm with frequent PACs, I will switch this to diltiazem 180 mg daily.  She is going to come back in a week for blood pressure check at which point we can  consider titration if necessary.   With pulmonary history, I would avoid beta-blockers and acei.  Could consider arb if necessary, though typically pulmonology prefers we avoid.  2.  Atypical chest pain: Previous nonobstructive catheterization in 2018.  Recent brief episode of chest pressure in the setting of a headache.  No recurrence.  No further work-up at this time.  3.  Frequent PACs: Adding diltiazem in the setting of elevated heart rates and blood pressure.  4.  Disposition: Follow-up in clinic in 3 months or sooner if necessary.  Blood pressure check 1 week.   Murray Hodgkins, NP 10/10/2017, 11:09 AM

## 2017-10-12 DIAGNOSIS — J209 Acute bronchitis, unspecified: Secondary | ICD-10-CM | POA: Diagnosis not present

## 2017-10-12 DIAGNOSIS — M1990 Unspecified osteoarthritis, unspecified site: Secondary | ICD-10-CM | POA: Diagnosis not present

## 2017-10-12 DIAGNOSIS — J849 Interstitial pulmonary disease, unspecified: Secondary | ICD-10-CM | POA: Diagnosis not present

## 2017-10-12 DIAGNOSIS — K5790 Diverticulosis of intestine, part unspecified, without perforation or abscess without bleeding: Secondary | ICD-10-CM | POA: Diagnosis not present

## 2017-10-12 DIAGNOSIS — K439 Ventral hernia without obstruction or gangrene: Secondary | ICD-10-CM | POA: Diagnosis not present

## 2017-10-12 DIAGNOSIS — N390 Urinary tract infection, site not specified: Secondary | ICD-10-CM | POA: Diagnosis not present

## 2017-10-12 DIAGNOSIS — K279 Peptic ulcer, site unspecified, unspecified as acute or chronic, without hemorrhage or perforation: Secondary | ICD-10-CM | POA: Diagnosis not present

## 2017-10-12 DIAGNOSIS — J432 Centrilobular emphysema: Secondary | ICD-10-CM | POA: Diagnosis not present

## 2017-10-12 DIAGNOSIS — R69 Illness, unspecified: Secondary | ICD-10-CM | POA: Diagnosis not present

## 2017-10-12 DIAGNOSIS — I679 Cerebrovascular disease, unspecified: Secondary | ICD-10-CM | POA: Diagnosis not present

## 2017-10-13 ENCOUNTER — Telehealth: Payer: Self-pay | Admitting: Pulmonary Disease

## 2017-10-13 NOTE — Telephone Encounter (Signed)
Sounds like she needs an OV

## 2017-10-13 NOTE — Telephone Encounter (Signed)
Attempted to call patient to schedule follow up appointment and PFT, no answer, message left to call back.

## 2017-10-13 NOTE — Telephone Encounter (Signed)
Called and spoke with Melissa from [facility]. She state that there was a request sent in for patient to be re educated on her o2 use. I advised Melissa that we have not ordered anything or had interaction with this patient since last October. Melissa states that the patient is also wanting a POC but she is very confused. Lenna Sciara says that she is worried about this patient because every time they have interaction with her she does not understand what is going on.   Melissa would like advisement from BQ on what to do for this patient.   BQ please advise, thank you.

## 2017-10-17 DIAGNOSIS — J432 Centrilobular emphysema: Secondary | ICD-10-CM | POA: Diagnosis not present

## 2017-10-17 DIAGNOSIS — J209 Acute bronchitis, unspecified: Secondary | ICD-10-CM | POA: Diagnosis not present

## 2017-10-17 DIAGNOSIS — N390 Urinary tract infection, site not specified: Secondary | ICD-10-CM | POA: Diagnosis not present

## 2017-10-17 DIAGNOSIS — R69 Illness, unspecified: Secondary | ICD-10-CM | POA: Diagnosis not present

## 2017-10-17 DIAGNOSIS — K439 Ventral hernia without obstruction or gangrene: Secondary | ICD-10-CM | POA: Diagnosis not present

## 2017-10-17 DIAGNOSIS — K279 Peptic ulcer, site unspecified, unspecified as acute or chronic, without hemorrhage or perforation: Secondary | ICD-10-CM | POA: Diagnosis not present

## 2017-10-17 DIAGNOSIS — M1990 Unspecified osteoarthritis, unspecified site: Secondary | ICD-10-CM | POA: Diagnosis not present

## 2017-10-17 DIAGNOSIS — I679 Cerebrovascular disease, unspecified: Secondary | ICD-10-CM | POA: Diagnosis not present

## 2017-10-17 DIAGNOSIS — J849 Interstitial pulmonary disease, unspecified: Secondary | ICD-10-CM | POA: Diagnosis not present

## 2017-10-17 DIAGNOSIS — K5790 Diverticulosis of intestine, part unspecified, without perforation or abscess without bleeding: Secondary | ICD-10-CM | POA: Diagnosis not present

## 2017-10-17 NOTE — Telephone Encounter (Signed)
ATC pt, no answer. Left message for pt to call back.  

## 2017-10-17 NOTE — Telephone Encounter (Signed)
Spoke with pt and advised that she needed an appt. I scheduled appt with BQ and PFT on 11/28/17. Nothing further is needed.

## 2017-10-19 DIAGNOSIS — R69 Illness, unspecified: Secondary | ICD-10-CM | POA: Diagnosis not present

## 2017-10-19 DIAGNOSIS — J849 Interstitial pulmonary disease, unspecified: Secondary | ICD-10-CM | POA: Diagnosis not present

## 2017-10-19 DIAGNOSIS — I679 Cerebrovascular disease, unspecified: Secondary | ICD-10-CM | POA: Diagnosis not present

## 2017-10-19 DIAGNOSIS — J209 Acute bronchitis, unspecified: Secondary | ICD-10-CM | POA: Diagnosis not present

## 2017-10-19 DIAGNOSIS — M1990 Unspecified osteoarthritis, unspecified site: Secondary | ICD-10-CM | POA: Diagnosis not present

## 2017-10-19 DIAGNOSIS — K5790 Diverticulosis of intestine, part unspecified, without perforation or abscess without bleeding: Secondary | ICD-10-CM | POA: Diagnosis not present

## 2017-10-19 DIAGNOSIS — K439 Ventral hernia without obstruction or gangrene: Secondary | ICD-10-CM | POA: Diagnosis not present

## 2017-10-19 DIAGNOSIS — K279 Peptic ulcer, site unspecified, unspecified as acute or chronic, without hemorrhage or perforation: Secondary | ICD-10-CM | POA: Diagnosis not present

## 2017-10-19 DIAGNOSIS — N390 Urinary tract infection, site not specified: Secondary | ICD-10-CM | POA: Diagnosis not present

## 2017-10-19 DIAGNOSIS — J432 Centrilobular emphysema: Secondary | ICD-10-CM | POA: Diagnosis not present

## 2017-10-20 DIAGNOSIS — I679 Cerebrovascular disease, unspecified: Secondary | ICD-10-CM | POA: Diagnosis not present

## 2017-10-20 DIAGNOSIS — N390 Urinary tract infection, site not specified: Secondary | ICD-10-CM | POA: Diagnosis not present

## 2017-10-20 DIAGNOSIS — J432 Centrilobular emphysema: Secondary | ICD-10-CM | POA: Diagnosis not present

## 2017-10-20 DIAGNOSIS — J209 Acute bronchitis, unspecified: Secondary | ICD-10-CM | POA: Diagnosis not present

## 2017-10-20 DIAGNOSIS — K279 Peptic ulcer, site unspecified, unspecified as acute or chronic, without hemorrhage or perforation: Secondary | ICD-10-CM | POA: Diagnosis not present

## 2017-10-20 DIAGNOSIS — R69 Illness, unspecified: Secondary | ICD-10-CM | POA: Diagnosis not present

## 2017-10-20 DIAGNOSIS — K5790 Diverticulosis of intestine, part unspecified, without perforation or abscess without bleeding: Secondary | ICD-10-CM | POA: Diagnosis not present

## 2017-10-20 DIAGNOSIS — J849 Interstitial pulmonary disease, unspecified: Secondary | ICD-10-CM | POA: Diagnosis not present

## 2017-10-20 DIAGNOSIS — M1990 Unspecified osteoarthritis, unspecified site: Secondary | ICD-10-CM | POA: Diagnosis not present

## 2017-10-20 DIAGNOSIS — K439 Ventral hernia without obstruction or gangrene: Secondary | ICD-10-CM | POA: Diagnosis not present

## 2017-10-23 DIAGNOSIS — J849 Interstitial pulmonary disease, unspecified: Secondary | ICD-10-CM | POA: Diagnosis not present

## 2017-10-23 DIAGNOSIS — J209 Acute bronchitis, unspecified: Secondary | ICD-10-CM | POA: Diagnosis not present

## 2017-10-23 DIAGNOSIS — K439 Ventral hernia without obstruction or gangrene: Secondary | ICD-10-CM | POA: Diagnosis not present

## 2017-10-23 DIAGNOSIS — R69 Illness, unspecified: Secondary | ICD-10-CM | POA: Diagnosis not present

## 2017-10-23 DIAGNOSIS — J432 Centrilobular emphysema: Secondary | ICD-10-CM | POA: Diagnosis not present

## 2017-10-23 DIAGNOSIS — I679 Cerebrovascular disease, unspecified: Secondary | ICD-10-CM | POA: Diagnosis not present

## 2017-10-23 DIAGNOSIS — K5790 Diverticulosis of intestine, part unspecified, without perforation or abscess without bleeding: Secondary | ICD-10-CM | POA: Diagnosis not present

## 2017-10-23 DIAGNOSIS — K279 Peptic ulcer, site unspecified, unspecified as acute or chronic, without hemorrhage or perforation: Secondary | ICD-10-CM | POA: Diagnosis not present

## 2017-10-23 DIAGNOSIS — N390 Urinary tract infection, site not specified: Secondary | ICD-10-CM | POA: Diagnosis not present

## 2017-10-23 DIAGNOSIS — M1990 Unspecified osteoarthritis, unspecified site: Secondary | ICD-10-CM | POA: Diagnosis not present

## 2017-10-24 ENCOUNTER — Telehealth: Payer: Self-pay | Admitting: Pulmonary Disease

## 2017-10-24 DIAGNOSIS — N390 Urinary tract infection, site not specified: Secondary | ICD-10-CM | POA: Diagnosis not present

## 2017-10-24 DIAGNOSIS — K5790 Diverticulosis of intestine, part unspecified, without perforation or abscess without bleeding: Secondary | ICD-10-CM | POA: Diagnosis not present

## 2017-10-24 DIAGNOSIS — J849 Interstitial pulmonary disease, unspecified: Secondary | ICD-10-CM | POA: Diagnosis not present

## 2017-10-24 DIAGNOSIS — I679 Cerebrovascular disease, unspecified: Secondary | ICD-10-CM | POA: Diagnosis not present

## 2017-10-24 DIAGNOSIS — R69 Illness, unspecified: Secondary | ICD-10-CM | POA: Diagnosis not present

## 2017-10-24 DIAGNOSIS — M1990 Unspecified osteoarthritis, unspecified site: Secondary | ICD-10-CM | POA: Diagnosis not present

## 2017-10-24 DIAGNOSIS — K279 Peptic ulcer, site unspecified, unspecified as acute or chronic, without hemorrhage or perforation: Secondary | ICD-10-CM | POA: Diagnosis not present

## 2017-10-24 DIAGNOSIS — K439 Ventral hernia without obstruction or gangrene: Secondary | ICD-10-CM | POA: Diagnosis not present

## 2017-10-24 DIAGNOSIS — J432 Centrilobular emphysema: Secondary | ICD-10-CM | POA: Diagnosis not present

## 2017-10-24 DIAGNOSIS — J209 Acute bronchitis, unspecified: Secondary | ICD-10-CM | POA: Diagnosis not present

## 2017-10-24 NOTE — Telephone Encounter (Signed)
Will route to [facility] to remind her to do a qualifying walk with [employer] when pt comes in for appt on 11/25/17

## 2017-10-25 DIAGNOSIS — M1990 Unspecified osteoarthritis, unspecified site: Secondary | ICD-10-CM | POA: Diagnosis not present

## 2017-10-25 DIAGNOSIS — N390 Urinary tract infection, site not specified: Secondary | ICD-10-CM | POA: Diagnosis not present

## 2017-10-25 DIAGNOSIS — J432 Centrilobular emphysema: Secondary | ICD-10-CM | POA: Diagnosis not present

## 2017-10-25 DIAGNOSIS — J849 Interstitial pulmonary disease, unspecified: Secondary | ICD-10-CM | POA: Diagnosis not present

## 2017-10-25 DIAGNOSIS — K279 Peptic ulcer, site unspecified, unspecified as acute or chronic, without hemorrhage or perforation: Secondary | ICD-10-CM | POA: Diagnosis not present

## 2017-10-25 DIAGNOSIS — I679 Cerebrovascular disease, unspecified: Secondary | ICD-10-CM | POA: Diagnosis not present

## 2017-10-25 DIAGNOSIS — J209 Acute bronchitis, unspecified: Secondary | ICD-10-CM | POA: Diagnosis not present

## 2017-10-25 DIAGNOSIS — K439 Ventral hernia without obstruction or gangrene: Secondary | ICD-10-CM | POA: Diagnosis not present

## 2017-10-25 DIAGNOSIS — R69 Illness, unspecified: Secondary | ICD-10-CM | POA: Diagnosis not present

## 2017-10-25 DIAGNOSIS — K5790 Diverticulosis of intestine, part unspecified, without perforation or abscess without bleeding: Secondary | ICD-10-CM | POA: Diagnosis not present

## 2017-10-25 NOTE — Telephone Encounter (Signed)
Made note on appt note as reminder of POC qualifying walk.

## 2017-10-27 ENCOUNTER — Encounter: Payer: Self-pay | Admitting: Family Medicine

## 2017-10-27 ENCOUNTER — Ambulatory Visit (INDEPENDENT_AMBULATORY_CARE_PROVIDER_SITE_OTHER): Payer: Medicare HMO | Admitting: Family Medicine

## 2017-10-27 VITALS — BP 134/82 | HR 93 | Temp 98.1°F | Ht 60.0 in | Wt 197.8 lb

## 2017-10-27 DIAGNOSIS — M21961 Unspecified acquired deformity of right lower leg: Secondary | ICD-10-CM

## 2017-10-27 DIAGNOSIS — M159 Polyosteoarthritis, unspecified: Secondary | ICD-10-CM

## 2017-10-27 DIAGNOSIS — J849 Interstitial pulmonary disease, unspecified: Secondary | ICD-10-CM | POA: Diagnosis not present

## 2017-10-27 DIAGNOSIS — M21962 Unspecified acquired deformity of left lower leg: Secondary | ICD-10-CM | POA: Diagnosis not present

## 2017-10-27 DIAGNOSIS — M150 Primary generalized (osteo)arthritis: Secondary | ICD-10-CM

## 2017-10-27 DIAGNOSIS — Z741 Need for assistance with personal care: Secondary | ICD-10-CM

## 2017-10-27 DIAGNOSIS — M8949 Other hypertrophic osteoarthropathy, multiple sites: Secondary | ICD-10-CM

## 2017-10-27 DIAGNOSIS — J432 Centrilobular emphysema: Secondary | ICD-10-CM | POA: Diagnosis not present

## 2017-10-27 DIAGNOSIS — R2689 Other abnormalities of gait and mobility: Secondary | ICD-10-CM

## 2017-10-27 NOTE — Progress Notes (Signed)
Subjective:    Patient ID: Susan [name], female    DOB: 04-08-1941, 77 y.o.   MRN: [id number]  HPI  77 year old female presents for evaluation for  Mobility evaluation.   She has multiple indications for a power mobility device.  She has DOE and is on continuous oxygen for interstitial pulmonary disease and COPD She has significant pain and deformity in feet, and ankles from severe osteoarthritis. Multiple surgeries. Significant pain with ambulation and difficulty with balance.  Left knee osteoarthritis pain with ambulation  She has had significant progression of lung disease causing her to now require a power mobility device. She is increasing unstable on her feet.She is currently using the walker, but still get very out breath and hypoxic. She has to stop  After 15 feet of walking  With walker given shortness of breath. Has to stop and sit down. Oxygen cord getting tangled  In walkerand leading to falls.   An manual wheelchair does  Medically meet her mobility needs given she cannot propel the wheelchair with severe arthritis in hands causing pain and deformity.   She is unable to use a scooter given the size of a scooter being  To large to manipulate in her home.   She has some dementia but has enough mental capacity  To safely operate a power mobility device liek a [employer].  She   Cannot get from room to room to do ADLs A power mobility device is necessary to do ADLS including dressing, bathing, toileting, eating. To use a power mobility device. A PMD would increase her ability  To remain active and interact with others.   She is willing and motivated  Blood pressure 134/82, pulse 93, temperature 98.1 F (36.7 C), temperature source Oral, height 5' (1.524 m), weight 197 lb 12.8 oz (89.7 kg), SpO2 93 %.  Review of Systems  Constitutional: Negative for fatigue and fever.  HENT: Negative for congestion.   Eyes: Negative for pain.  Respiratory: Positive for chest tightness  and shortness of breath. Negative for cough.   Cardiovascular: Negative for chest pain, palpitations and leg swelling.  Gastrointestinal: Negative for abdominal pain.  Genitourinary: Negative for dysuria and vaginal bleeding.  Musculoskeletal: Positive for arthralgias, back pain and gait problem.  Neurological: Negative for syncope, light-headedness and headaches.  Psychiatric/Behavioral: Negative for dysphoric mood.       Objective:   Physical Exam  Constitutional: Vital signs are normal. She appears well-developed and well-nourished. She is cooperative.  Non-toxic appearance. She does not appear ill. No distress.  HENT:  Head: Normocephalic.  Right Ear: Hearing, tympanic membrane, external ear and ear canal normal. Tympanic membrane is not erythematous, not retracted and not bulging.  Left Ear: Hearing, tympanic membrane, external ear and ear canal normal. Tympanic membrane is not erythematous, not retracted and not bulging.  Nose: No mucosal edema or rhinorrhea. Right sinus exhibits no maxillary sinus tenderness and no frontal sinus tenderness. Left sinus exhibits no maxillary sinus tenderness and no frontal sinus tenderness.  Mouth/Throat: Uvula is midline, oropharynx is clear and moist and mucous membranes are normal.  Eyes: Pupils are equal, round, and reactive to light. Conjunctivae, EOM and lids are normal. Lids are everted and swept, no foreign bodies found.  Neck: Trachea normal and normal range of motion. Neck supple. Carotid bruit is not present. No thyroid mass and no thyromegaly present.  Cardiovascular: Normal rate, regular rhythm, S1 normal, S2 normal, normal heart sounds, intact distal pulses and normal pulses.  Exam reveals no gallop and no friction rub.  No murmur heard. Pulmonary/Chest: Effort normal. No tachypnea. No respiratory distress. She has decreased breath sounds. She has no wheezes. She has no rhonchi. She has no rales.  On oxygen  Abdominal: Soft. Normal  appearance and bowel sounds are normal. There is no tenderness.  Musculoskeletal:       Right shoulder: She exhibits decreased range of motion, tenderness and bony tenderness.       Left shoulder: She exhibits decreased range of motion. She exhibits no tenderness and no bony tenderness.       Right wrist: Normal.       Left wrist: Normal.       Right ankle: She exhibits decreased range of motion and deformity. Tenderness.       Left ankle: She exhibits decreased range of motion and deformity. Tenderness.       Lumbar back: She exhibits decreased range of motion and tenderness.       Right hand: She exhibits decreased range of motion, tenderness and bony tenderness. Normal sensation noted. Decreased strength noted. She exhibits finger abduction, thumb/finger opposition and wrist extension trouble.       Left hand: She exhibits decreased range of motion and tenderness. Normal sensation noted. Decreased strength noted. She exhibits finger abduction, thumb/finger opposition and wrist extension trouble.       Right foot: There is decreased range of motion, tenderness, bony tenderness and deformity.       Left foot: There is decreased range of motion, tenderness, bony tenderness and deformity.  Severe deformity of hands ankles and especially feet from OA  Only 45 degrees ROM in bilateral shoulder  0 ROM in bilateral ankles and feet  Feet:  Right Foot:  Skin Integrity: Positive for callus. Negative for ulcer.  Left Foot:  Skin Integrity: Positive for callus. Negative for ulcer.  Neurological: She is alert. No cranial nerve deficit or sensory deficit.   RUQ 4/5, LUE 4/5 RLE 4/5  LLE 4/5  Pain 8/10 in bialteral ankles and feet with  Attempts at ROM Poor balance given foot and ankle issues  unable to walk forward and keep balance without a walker  GAIT; shuffling slow imbalanced  Has to stop after a few feet because of shortness of breath. Has to rest every few feet to catch breath.  Cannot  easily without assistance move walker forward  Skin: Skin is warm, dry and intact. No rash noted.  Psychiatric: Her speech is normal and behavior is normal. Judgment and thought content normal. Her mood appears not anxious. Cognition and memory are normal. She does not exhibit a depressed mood.          Assessment & Plan:

## 2017-10-28 DIAGNOSIS — J209 Acute bronchitis, unspecified: Secondary | ICD-10-CM | POA: Diagnosis not present

## 2017-10-28 DIAGNOSIS — K279 Peptic ulcer, site unspecified, unspecified as acute or chronic, without hemorrhage or perforation: Secondary | ICD-10-CM | POA: Diagnosis not present

## 2017-10-28 DIAGNOSIS — J432 Centrilobular emphysema: Secondary | ICD-10-CM | POA: Diagnosis not present

## 2017-10-28 DIAGNOSIS — I679 Cerebrovascular disease, unspecified: Secondary | ICD-10-CM | POA: Diagnosis not present

## 2017-10-28 DIAGNOSIS — R69 Illness, unspecified: Secondary | ICD-10-CM | POA: Diagnosis not present

## 2017-10-28 DIAGNOSIS — K5790 Diverticulosis of intestine, part unspecified, without perforation or abscess without bleeding: Secondary | ICD-10-CM | POA: Diagnosis not present

## 2017-10-28 DIAGNOSIS — K439 Ventral hernia without obstruction or gangrene: Secondary | ICD-10-CM | POA: Diagnosis not present

## 2017-10-28 DIAGNOSIS — J849 Interstitial pulmonary disease, unspecified: Secondary | ICD-10-CM | POA: Diagnosis not present

## 2017-10-28 DIAGNOSIS — M1990 Unspecified osteoarthritis, unspecified site: Secondary | ICD-10-CM | POA: Diagnosis not present

## 2017-10-28 DIAGNOSIS — N390 Urinary tract infection, site not specified: Secondary | ICD-10-CM | POA: Diagnosis not present

## 2017-10-29 DIAGNOSIS — J849 Interstitial pulmonary disease, unspecified: Secondary | ICD-10-CM | POA: Diagnosis not present

## 2017-10-29 DIAGNOSIS — J432 Centrilobular emphysema: Secondary | ICD-10-CM | POA: Diagnosis not present

## 2017-10-29 DIAGNOSIS — R0602 Shortness of breath: Secondary | ICD-10-CM | POA: Diagnosis not present

## 2017-10-29 DIAGNOSIS — G4733 Obstructive sleep apnea (adult) (pediatric): Secondary | ICD-10-CM | POA: Diagnosis not present

## 2017-10-29 DIAGNOSIS — J449 Chronic obstructive pulmonary disease, unspecified: Secondary | ICD-10-CM | POA: Diagnosis not present

## 2017-10-29 DIAGNOSIS — R0603 Acute respiratory distress: Secondary | ICD-10-CM | POA: Diagnosis not present

## 2017-10-30 DIAGNOSIS — J209 Acute bronchitis, unspecified: Secondary | ICD-10-CM | POA: Diagnosis not present

## 2017-10-30 DIAGNOSIS — R69 Illness, unspecified: Secondary | ICD-10-CM | POA: Diagnosis not present

## 2017-10-30 DIAGNOSIS — J432 Centrilobular emphysema: Secondary | ICD-10-CM | POA: Diagnosis not present

## 2017-10-30 DIAGNOSIS — N390 Urinary tract infection, site not specified: Secondary | ICD-10-CM | POA: Diagnosis not present

## 2017-10-30 DIAGNOSIS — M1990 Unspecified osteoarthritis, unspecified site: Secondary | ICD-10-CM | POA: Diagnosis not present

## 2017-10-30 DIAGNOSIS — K279 Peptic ulcer, site unspecified, unspecified as acute or chronic, without hemorrhage or perforation: Secondary | ICD-10-CM | POA: Diagnosis not present

## 2017-10-30 DIAGNOSIS — K5790 Diverticulosis of intestine, part unspecified, without perforation or abscess without bleeding: Secondary | ICD-10-CM | POA: Diagnosis not present

## 2017-10-30 DIAGNOSIS — I679 Cerebrovascular disease, unspecified: Secondary | ICD-10-CM | POA: Diagnosis not present

## 2017-10-30 DIAGNOSIS — J849 Interstitial pulmonary disease, unspecified: Secondary | ICD-10-CM | POA: Diagnosis not present

## 2017-10-30 DIAGNOSIS — K439 Ventral hernia without obstruction or gangrene: Secondary | ICD-10-CM | POA: Diagnosis not present

## 2017-11-02 DIAGNOSIS — M1990 Unspecified osteoarthritis, unspecified site: Secondary | ICD-10-CM | POA: Diagnosis not present

## 2017-11-02 DIAGNOSIS — J432 Centrilobular emphysema: Secondary | ICD-10-CM | POA: Diagnosis not present

## 2017-11-02 DIAGNOSIS — I679 Cerebrovascular disease, unspecified: Secondary | ICD-10-CM | POA: Diagnosis not present

## 2017-11-02 DIAGNOSIS — N390 Urinary tract infection, site not specified: Secondary | ICD-10-CM | POA: Diagnosis not present

## 2017-11-02 DIAGNOSIS — R569 Unspecified convulsions: Secondary | ICD-10-CM | POA: Insufficient documentation

## 2017-11-02 DIAGNOSIS — J849 Interstitial pulmonary disease, unspecified: Secondary | ICD-10-CM | POA: Diagnosis not present

## 2017-11-02 DIAGNOSIS — K439 Ventral hernia without obstruction or gangrene: Secondary | ICD-10-CM | POA: Diagnosis not present

## 2017-11-02 DIAGNOSIS — R69 Illness, unspecified: Secondary | ICD-10-CM | POA: Diagnosis not present

## 2017-11-02 DIAGNOSIS — K5790 Diverticulosis of intestine, part unspecified, without perforation or abscess without bleeding: Secondary | ICD-10-CM | POA: Diagnosis not present

## 2017-11-02 DIAGNOSIS — J209 Acute bronchitis, unspecified: Secondary | ICD-10-CM | POA: Diagnosis not present

## 2017-11-02 DIAGNOSIS — K279 Peptic ulcer, site unspecified, unspecified as acute or chronic, without hemorrhage or perforation: Secondary | ICD-10-CM | POA: Diagnosis not present

## 2017-11-02 NOTE — Assessment & Plan Note (Signed)
Progressively worsening. On oxygen. Followed by pulmonary.

## 2017-11-02 NOTE — Assessment & Plan Note (Addendum)
Unclear cause.Marland Kitchen Pt  Will follow up ASAP with cardiology and neurology given complicated history.  reviewed EMS EKG.. Unremarkable, NSR, no new changes.

## 2017-11-02 NOTE — Assessment & Plan Note (Signed)
On Keppra.. Recent presyncopal spell no consistent with typical seizure.. More like lightheaded.  Keep follow up with [facility] as planned.

## 2017-11-02 NOTE — Assessment & Plan Note (Signed)
E. coli, Klebsiella UTI:  Complete cipro course.

## 2017-11-06 DIAGNOSIS — J849 Interstitial pulmonary disease, unspecified: Secondary | ICD-10-CM | POA: Diagnosis not present

## 2017-11-06 DIAGNOSIS — J209 Acute bronchitis, unspecified: Secondary | ICD-10-CM | POA: Diagnosis not present

## 2017-11-06 DIAGNOSIS — M1990 Unspecified osteoarthritis, unspecified site: Secondary | ICD-10-CM | POA: Diagnosis not present

## 2017-11-06 DIAGNOSIS — K5790 Diverticulosis of intestine, part unspecified, without perforation or abscess without bleeding: Secondary | ICD-10-CM | POA: Diagnosis not present

## 2017-11-06 DIAGNOSIS — J432 Centrilobular emphysema: Secondary | ICD-10-CM | POA: Diagnosis not present

## 2017-11-06 DIAGNOSIS — K439 Ventral hernia without obstruction or gangrene: Secondary | ICD-10-CM | POA: Diagnosis not present

## 2017-11-06 DIAGNOSIS — N390 Urinary tract infection, site not specified: Secondary | ICD-10-CM | POA: Diagnosis not present

## 2017-11-06 DIAGNOSIS — I679 Cerebrovascular disease, unspecified: Secondary | ICD-10-CM | POA: Diagnosis not present

## 2017-11-06 DIAGNOSIS — K279 Peptic ulcer, site unspecified, unspecified as acute or chronic, without hemorrhage or perforation: Secondary | ICD-10-CM | POA: Diagnosis not present

## 2017-11-06 DIAGNOSIS — R69 Illness, unspecified: Secondary | ICD-10-CM | POA: Diagnosis not present

## 2017-11-07 DIAGNOSIS — N390 Urinary tract infection, site not specified: Secondary | ICD-10-CM | POA: Diagnosis not present

## 2017-11-07 DIAGNOSIS — K279 Peptic ulcer, site unspecified, unspecified as acute or chronic, without hemorrhage or perforation: Secondary | ICD-10-CM | POA: Diagnosis not present

## 2017-11-07 DIAGNOSIS — M1990 Unspecified osteoarthritis, unspecified site: Secondary | ICD-10-CM | POA: Diagnosis not present

## 2017-11-07 DIAGNOSIS — K5790 Diverticulosis of intestine, part unspecified, without perforation or abscess without bleeding: Secondary | ICD-10-CM | POA: Diagnosis not present

## 2017-11-07 DIAGNOSIS — J209 Acute bronchitis, unspecified: Secondary | ICD-10-CM | POA: Diagnosis not present

## 2017-11-07 DIAGNOSIS — R69 Illness, unspecified: Secondary | ICD-10-CM | POA: Diagnosis not present

## 2017-11-07 DIAGNOSIS — I679 Cerebrovascular disease, unspecified: Secondary | ICD-10-CM | POA: Diagnosis not present

## 2017-11-07 DIAGNOSIS — J849 Interstitial pulmonary disease, unspecified: Secondary | ICD-10-CM | POA: Diagnosis not present

## 2017-11-07 DIAGNOSIS — K439 Ventral hernia without obstruction or gangrene: Secondary | ICD-10-CM | POA: Diagnosis not present

## 2017-11-07 DIAGNOSIS — J432 Centrilobular emphysema: Secondary | ICD-10-CM | POA: Diagnosis not present

## 2017-11-08 ENCOUNTER — Telehealth: Payer: Self-pay

## 2017-11-08 NOTE — Telephone Encounter (Signed)
[employer] notified that fax has been received but will not be signed until next Tuesday 11/14/17 due to Dr. Diona Browner is out of the office until then.

## 2017-11-08 NOTE — Telephone Encounter (Signed)
Copied from [employer] [phone]. Topic: General - Other >> Nov 08, 2017 11:35 AM Judyann Munson wrote: Reason for CRM:  [name] [facility]) is calling to see if a fax was received. The reference # [id number], her contact number is [phone]

## 2017-11-09 DIAGNOSIS — K5790 Diverticulosis of intestine, part unspecified, without perforation or abscess without bleeding: Secondary | ICD-10-CM | POA: Diagnosis not present

## 2017-11-09 DIAGNOSIS — J432 Centrilobular emphysema: Secondary | ICD-10-CM | POA: Diagnosis not present

## 2017-11-09 DIAGNOSIS — K279 Peptic ulcer, site unspecified, unspecified as acute or chronic, without hemorrhage or perforation: Secondary | ICD-10-CM | POA: Diagnosis not present

## 2017-11-09 DIAGNOSIS — K439 Ventral hernia without obstruction or gangrene: Secondary | ICD-10-CM | POA: Diagnosis not present

## 2017-11-09 DIAGNOSIS — J849 Interstitial pulmonary disease, unspecified: Secondary | ICD-10-CM | POA: Diagnosis not present

## 2017-11-09 DIAGNOSIS — N390 Urinary tract infection, site not specified: Secondary | ICD-10-CM | POA: Diagnosis not present

## 2017-11-09 DIAGNOSIS — I679 Cerebrovascular disease, unspecified: Secondary | ICD-10-CM | POA: Diagnosis not present

## 2017-11-09 DIAGNOSIS — J209 Acute bronchitis, unspecified: Secondary | ICD-10-CM | POA: Diagnosis not present

## 2017-11-09 DIAGNOSIS — M1990 Unspecified osteoarthritis, unspecified site: Secondary | ICD-10-CM | POA: Diagnosis not present

## 2017-11-09 DIAGNOSIS — R69 Illness, unspecified: Secondary | ICD-10-CM | POA: Diagnosis not present

## 2017-11-09 NOTE — Telephone Encounter (Signed)
Ok she has an appt with BQ next month.

## 2017-11-10 DIAGNOSIS — R4189 Other symptoms and signs involving cognitive functions and awareness: Secondary | ICD-10-CM | POA: Diagnosis not present

## 2017-11-10 DIAGNOSIS — G40219 Localization-related (focal) (partial) symptomatic epilepsy and epileptic syndromes with complex partial seizures, intractable, without status epilepticus: Secondary | ICD-10-CM | POA: Diagnosis not present

## 2017-11-10 DIAGNOSIS — G4733 Obstructive sleep apnea (adult) (pediatric): Secondary | ICD-10-CM | POA: Diagnosis not present

## 2017-11-10 DIAGNOSIS — Z8673 Personal history of transient ischemic attack (TIA), and cerebral infarction without residual deficits: Secondary | ICD-10-CM | POA: Diagnosis not present

## 2017-11-10 DIAGNOSIS — G2581 Restless legs syndrome: Secondary | ICD-10-CM | POA: Diagnosis not present

## 2017-11-13 DIAGNOSIS — N390 Urinary tract infection, site not specified: Secondary | ICD-10-CM | POA: Diagnosis not present

## 2017-11-13 DIAGNOSIS — K439 Ventral hernia without obstruction or gangrene: Secondary | ICD-10-CM | POA: Diagnosis not present

## 2017-11-13 DIAGNOSIS — R69 Illness, unspecified: Secondary | ICD-10-CM | POA: Diagnosis not present

## 2017-11-13 DIAGNOSIS — M1990 Unspecified osteoarthritis, unspecified site: Secondary | ICD-10-CM | POA: Diagnosis not present

## 2017-11-13 DIAGNOSIS — K5790 Diverticulosis of intestine, part unspecified, without perforation or abscess without bleeding: Secondary | ICD-10-CM | POA: Diagnosis not present

## 2017-11-13 DIAGNOSIS — I679 Cerebrovascular disease, unspecified: Secondary | ICD-10-CM | POA: Diagnosis not present

## 2017-11-13 DIAGNOSIS — J432 Centrilobular emphysema: Secondary | ICD-10-CM | POA: Diagnosis not present

## 2017-11-13 DIAGNOSIS — J849 Interstitial pulmonary disease, unspecified: Secondary | ICD-10-CM | POA: Diagnosis not present

## 2017-11-13 DIAGNOSIS — K279 Peptic ulcer, site unspecified, unspecified as acute or chronic, without hemorrhage or perforation: Secondary | ICD-10-CM | POA: Diagnosis not present

## 2017-11-13 DIAGNOSIS — J209 Acute bronchitis, unspecified: Secondary | ICD-10-CM | POA: Diagnosis not present

## 2017-11-14 DIAGNOSIS — M1990 Unspecified osteoarthritis, unspecified site: Secondary | ICD-10-CM | POA: Diagnosis not present

## 2017-11-14 DIAGNOSIS — N390 Urinary tract infection, site not specified: Secondary | ICD-10-CM | POA: Diagnosis not present

## 2017-11-14 DIAGNOSIS — I679 Cerebrovascular disease, unspecified: Secondary | ICD-10-CM | POA: Diagnosis not present

## 2017-11-14 DIAGNOSIS — J432 Centrilobular emphysema: Secondary | ICD-10-CM | POA: Diagnosis not present

## 2017-11-14 DIAGNOSIS — J209 Acute bronchitis, unspecified: Secondary | ICD-10-CM | POA: Diagnosis not present

## 2017-11-14 DIAGNOSIS — R69 Illness, unspecified: Secondary | ICD-10-CM | POA: Diagnosis not present

## 2017-11-14 DIAGNOSIS — K439 Ventral hernia without obstruction or gangrene: Secondary | ICD-10-CM | POA: Diagnosis not present

## 2017-11-14 DIAGNOSIS — K279 Peptic ulcer, site unspecified, unspecified as acute or chronic, without hemorrhage or perforation: Secondary | ICD-10-CM | POA: Diagnosis not present

## 2017-11-14 DIAGNOSIS — J849 Interstitial pulmonary disease, unspecified: Secondary | ICD-10-CM | POA: Diagnosis not present

## 2017-11-14 DIAGNOSIS — K5790 Diverticulosis of intestine, part unspecified, without perforation or abscess without bleeding: Secondary | ICD-10-CM | POA: Diagnosis not present

## 2017-11-16 DIAGNOSIS — I679 Cerebrovascular disease, unspecified: Secondary | ICD-10-CM | POA: Diagnosis not present

## 2017-11-16 DIAGNOSIS — K5790 Diverticulosis of intestine, part unspecified, without perforation or abscess without bleeding: Secondary | ICD-10-CM | POA: Diagnosis not present

## 2017-11-16 DIAGNOSIS — R69 Illness, unspecified: Secondary | ICD-10-CM | POA: Diagnosis not present

## 2017-11-16 DIAGNOSIS — J849 Interstitial pulmonary disease, unspecified: Secondary | ICD-10-CM | POA: Diagnosis not present

## 2017-11-16 DIAGNOSIS — M1990 Unspecified osteoarthritis, unspecified site: Secondary | ICD-10-CM | POA: Diagnosis not present

## 2017-11-16 DIAGNOSIS — K439 Ventral hernia without obstruction or gangrene: Secondary | ICD-10-CM | POA: Diagnosis not present

## 2017-11-16 DIAGNOSIS — J432 Centrilobular emphysema: Secondary | ICD-10-CM | POA: Diagnosis not present

## 2017-11-16 DIAGNOSIS — J209 Acute bronchitis, unspecified: Secondary | ICD-10-CM | POA: Diagnosis not present

## 2017-11-16 DIAGNOSIS — N390 Urinary tract infection, site not specified: Secondary | ICD-10-CM | POA: Diagnosis not present

## 2017-11-16 DIAGNOSIS — K279 Peptic ulcer, site unspecified, unspecified as acute or chronic, without hemorrhage or perforation: Secondary | ICD-10-CM | POA: Diagnosis not present

## 2017-11-20 DIAGNOSIS — K439 Ventral hernia without obstruction or gangrene: Secondary | ICD-10-CM | POA: Diagnosis not present

## 2017-11-20 DIAGNOSIS — R69 Illness, unspecified: Secondary | ICD-10-CM | POA: Diagnosis not present

## 2017-11-20 DIAGNOSIS — M1990 Unspecified osteoarthritis, unspecified site: Secondary | ICD-10-CM | POA: Diagnosis not present

## 2017-11-20 DIAGNOSIS — J449 Chronic obstructive pulmonary disease, unspecified: Secondary | ICD-10-CM | POA: Diagnosis not present

## 2017-11-20 DIAGNOSIS — J849 Interstitial pulmonary disease, unspecified: Secondary | ICD-10-CM | POA: Diagnosis not present

## 2017-11-20 DIAGNOSIS — K5790 Diverticulosis of intestine, part unspecified, without perforation or abscess without bleeding: Secondary | ICD-10-CM | POA: Diagnosis not present

## 2017-11-20 DIAGNOSIS — J432 Centrilobular emphysema: Secondary | ICD-10-CM | POA: Diagnosis not present

## 2017-11-20 DIAGNOSIS — Z9981 Dependence on supplemental oxygen: Secondary | ICD-10-CM | POA: Diagnosis not present

## 2017-11-20 DIAGNOSIS — N390 Urinary tract infection, site not specified: Secondary | ICD-10-CM | POA: Diagnosis not present

## 2017-11-20 DIAGNOSIS — J209 Acute bronchitis, unspecified: Secondary | ICD-10-CM | POA: Diagnosis not present

## 2017-11-20 DIAGNOSIS — I679 Cerebrovascular disease, unspecified: Secondary | ICD-10-CM | POA: Diagnosis not present

## 2017-11-20 DIAGNOSIS — K279 Peptic ulcer, site unspecified, unspecified as acute or chronic, without hemorrhage or perforation: Secondary | ICD-10-CM | POA: Diagnosis not present

## 2017-11-22 ENCOUNTER — Telehealth: Payer: Self-pay

## 2017-11-22 DIAGNOSIS — K5790 Diverticulosis of intestine, part unspecified, without perforation or abscess without bleeding: Secondary | ICD-10-CM | POA: Diagnosis not present

## 2017-11-22 DIAGNOSIS — J209 Acute bronchitis, unspecified: Secondary | ICD-10-CM | POA: Diagnosis not present

## 2017-11-22 DIAGNOSIS — J432 Centrilobular emphysema: Secondary | ICD-10-CM | POA: Diagnosis not present

## 2017-11-22 DIAGNOSIS — N390 Urinary tract infection, site not specified: Secondary | ICD-10-CM | POA: Diagnosis not present

## 2017-11-22 DIAGNOSIS — M1990 Unspecified osteoarthritis, unspecified site: Secondary | ICD-10-CM | POA: Diagnosis not present

## 2017-11-22 DIAGNOSIS — R69 Illness, unspecified: Secondary | ICD-10-CM | POA: Diagnosis not present

## 2017-11-22 DIAGNOSIS — K439 Ventral hernia without obstruction or gangrene: Secondary | ICD-10-CM | POA: Diagnosis not present

## 2017-11-22 DIAGNOSIS — I679 Cerebrovascular disease, unspecified: Secondary | ICD-10-CM | POA: Diagnosis not present

## 2017-11-22 DIAGNOSIS — K279 Peptic ulcer, site unspecified, unspecified as acute or chronic, without hemorrhage or perforation: Secondary | ICD-10-CM | POA: Diagnosis not present

## 2017-11-22 DIAGNOSIS — J849 Interstitial pulmonary disease, unspecified: Secondary | ICD-10-CM | POA: Diagnosis not present

## 2017-11-22 NOTE — Telephone Encounter (Signed)
Work on low salt diet.. If progressing or not resolving with elevation and time.. Or if a big worsening of SOb.. Make appt for eval.

## 2017-11-22 NOTE — Telephone Encounter (Signed)
Susan [name], with [facility] called to report [name]. Has had swelling to feet and ankles since Monday. Ate potato chips over the weekend. [name] reports it is "trace - one of her feet might be +1 pitting." No other symptoms. [name]. Is up cooking deviled eggs. Please advise [name].'s daughter [name] - [phone].

## 2017-11-22 NOTE — Telephone Encounter (Signed)
Left message for [name] (daughter) to have Susan [name] work on a low [employer].  Elevate feet when able.  If not resolving or getting worse with low salt diet and elevation or [name] starts having SOB, she will need an appointment to be evaluated.

## 2017-11-27 ENCOUNTER — Other Ambulatory Visit: Payer: Self-pay | Admitting: Pulmonary Disease

## 2017-11-27 DIAGNOSIS — J209 Acute bronchitis, unspecified: Secondary | ICD-10-CM | POA: Diagnosis not present

## 2017-11-27 DIAGNOSIS — K439 Ventral hernia without obstruction or gangrene: Secondary | ICD-10-CM | POA: Diagnosis not present

## 2017-11-27 DIAGNOSIS — R69 Illness, unspecified: Secondary | ICD-10-CM | POA: Diagnosis not present

## 2017-11-27 DIAGNOSIS — I679 Cerebrovascular disease, unspecified: Secondary | ICD-10-CM | POA: Diagnosis not present

## 2017-11-27 DIAGNOSIS — M1990 Unspecified osteoarthritis, unspecified site: Secondary | ICD-10-CM | POA: Diagnosis not present

## 2017-11-27 DIAGNOSIS — N390 Urinary tract infection, site not specified: Secondary | ICD-10-CM | POA: Diagnosis not present

## 2017-11-27 DIAGNOSIS — J849 Interstitial pulmonary disease, unspecified: Secondary | ICD-10-CM | POA: Diagnosis not present

## 2017-11-27 DIAGNOSIS — K5790 Diverticulosis of intestine, part unspecified, without perforation or abscess without bleeding: Secondary | ICD-10-CM | POA: Diagnosis not present

## 2017-11-27 DIAGNOSIS — J432 Centrilobular emphysema: Secondary | ICD-10-CM | POA: Diagnosis not present

## 2017-11-27 DIAGNOSIS — K279 Peptic ulcer, site unspecified, unspecified as acute or chronic, without hemorrhage or perforation: Secondary | ICD-10-CM | POA: Diagnosis not present

## 2017-11-28 ENCOUNTER — Ambulatory Visit (INDEPENDENT_AMBULATORY_CARE_PROVIDER_SITE_OTHER): Payer: Medicare HMO | Admitting: Pulmonary Disease

## 2017-11-28 ENCOUNTER — Ambulatory Visit: Payer: Medicare HMO | Admitting: Pulmonary Disease

## 2017-11-28 ENCOUNTER — Ambulatory Visit: Payer: Medicare HMO

## 2017-11-28 ENCOUNTER — Encounter: Payer: Self-pay | Admitting: Pulmonary Disease

## 2017-11-28 VITALS — BP 128/80 | HR 94 | Ht 60.0 in | Wt 202.0 lb

## 2017-11-28 DIAGNOSIS — R1310 Dysphagia, unspecified: Secondary | ICD-10-CM

## 2017-11-28 DIAGNOSIS — J432 Centrilobular emphysema: Secondary | ICD-10-CM

## 2017-11-28 DIAGNOSIS — T17908A Unspecified foreign body in respiratory tract, part unspecified causing other injury, initial encounter: Secondary | ICD-10-CM

## 2017-11-28 DIAGNOSIS — J849 Interstitial pulmonary disease, unspecified: Secondary | ICD-10-CM

## 2017-11-28 DIAGNOSIS — G4733 Obstructive sleep apnea (adult) (pediatric): Secondary | ICD-10-CM | POA: Diagnosis not present

## 2017-11-28 DIAGNOSIS — R0602 Shortness of breath: Secondary | ICD-10-CM | POA: Diagnosis not present

## 2017-11-28 DIAGNOSIS — R0603 Acute respiratory distress: Secondary | ICD-10-CM | POA: Diagnosis not present

## 2017-11-28 DIAGNOSIS — R69 Illness, unspecified: Secondary | ICD-10-CM | POA: Diagnosis not present

## 2017-11-28 DIAGNOSIS — J449 Chronic obstructive pulmonary disease, unspecified: Secondary | ICD-10-CM | POA: Diagnosis not present

## 2017-11-28 LAB — PULMONARY FUNCTION TEST
DL/VA % pred: 95 %
DL/VA: 4.04 ml/min/mmHg/L
DLCO UNC % PRED: 68 %
DLCO UNC: 12.86 ml/min/mmHg
FEF 25-75 PRE: 1.77 L/s
FEF 25-75 Post: 1.42 L/sec
FEF2575-%Change-Post: -19 %
FEF2575-%PRED-POST: 102 %
FEF2575-%Pred-Pre: 128 %
FEV1-%CHANGE-POST: -6 %
FEV1-%PRED-POST: 73 %
FEV1-%Pred-Pre: 78 %
FEV1-POST: 1.25 L
FEV1-Pre: 1.34 L
FEV1FVC-%Change-Post: 1 %
FEV1FVC-%PRED-PRE: 117 %
FEV6-%Change-Post: -7 %
FEV6-%PRED-POST: 64 %
FEV6-%PRED-PRE: 70 %
FEV6-POST: 1.41 L
FEV6-PRE: 1.52 L
FEV6FVC-%PRED-POST: 106 %
FEV6FVC-%PRED-PRE: 106 %
FVC-%Change-Post: -7 %
FVC-%PRED-POST: 61 %
FVC-%Pred-Pre: 66 %
FVC-POST: 1.41 L
FVC-PRE: 1.52 L
POST FEV6/FVC RATIO: 100 %
PRE FEV6/FVC RATIO: 100 %
Post FEV1/FVC ratio: 89 %
Pre FEV1/FVC ratio: 88 %
RV % PRED: 65 %
RV: 1.39 L
TLC % pred: 75 %
TLC: 3.35 L

## 2017-11-28 MED ORDER — IPRATROPIUM-ALBUTEROL 0.5-2.5 (3) MG/3ML IN SOLN: 3.0000 mL | Freq: Three times a day (TID) | RESPIRATORY_TRACT | 5 refills | Status: DC

## 2017-11-28 NOTE — Patient Instructions (Signed)
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: We will check a high-resolution CT scan of the chest because of your worsening shortness of breath to see if there is anything else going on  Chronic aspiration: Continue to follow the recommendations from the speech therapist, specifically clearing out her airway after each swallow  Centrilobular emphysema: Start taking DuoNeb 3 times a day  Chronic respiratory failure with hypoxemia: Keep using 2 L of oxygen continuously, we will see if we can get a prescription from your primary care physician's office to the DME company for a portable oxygen concentrator  We will see you back in about a month to go over the results of the high-resolution CT scan of your chest, sooner if needed

## 2017-11-28 NOTE — Telephone Encounter (Signed)
Patient attempted to qualify for POC but had difficulty walking and we were unable to complete. Will call [name] at [facility] to see if they can go test patient at home. (It's after 5 so will call another day)

## 2017-11-28 NOTE — Progress Notes (Signed)
Subjective:    Patient ID: Susan [name], female    DOB: 02/08/1941, 77 y.o.   MRN: [id number]  Synopsis: [name] first saw the [facility] pulmonary clinic in September 2014 for shortness of breath. She had a previous heavy smoking history but he quit by the time she saw me. Lung function testing was actually not consistent with obstruction but she did have emphysema on her chest CT. Spiriva was started empirically for that. She also had a home sleep study which showed an apnea hypopnea index of 44. A follow-up split-night study showed an AHI of only 18, CPAP was titrated to 10 cm of water but she was only able to wear it for approximately 15 mintes. She has had difficulty using CPAP when prescribed in the past.   As of 02/2017 she has not been compliant with CPAP.   In 2017 she developed worsening fibrotic changes due to silent aspiration.    We have had many discussions about the possibility of an open lung biopsy but she is never been interested in this.  We tried prednisone for several months in 2018 but this did not help her shortness of breath  HPI  Chief Complaint  Patient presents with  . Follow-up    PFT today   [name] says she is doing worse.  Specifically her shortness of breath has worsened significantly over the last several months.  She can now do very few activities without getting short of breath and she is short of breath at rest sometimes.  She does have a cough from time to time with some clear mucus production.  She had an exacerbation of her underlying lung disease and was hospitalized for it in February.  She had another exacerbation of COPD associated with a UTI once after that.  She qualified for oxygen with her primary care physician and has been using it at home.  She says that it helps quite a bit.  She is not taking Symbicort routinely.  She complains that it cost too much money.  Past Medical History:  Diagnosis Date  . Acute peptic ulcer, unspecified  site, with hemorrhage and perforation, without mention of obstruction   . Arthritis   . Asthma   . Balance problems    unsteady on feet at time  . 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 06/2017 admit w/ aphasia and blurred vision.  RI w/ extensive chronic microhemorrhages c/w amyloid angiopathy-->ASA initiated.  . Chronic airway obstruction, not elsewhere classified    on inhalers  . Diastolic dysfunction    a. 06/2017 Echo: EF 50-55%, no rwma, Gr1 DD, nl LA size, nl RV fxn.  . Diverticulosis of colon (without mention of hemorrhage)   . Hematochezia   . 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 Morbid obesity ([place])   . Non-obstructive CAD (coronary artery disease)    a. 12/2012 MV: EF 71%, no ischemia; b. 06/2016 Cath: LM nl, LAD 50p, LCX nl, RCA 29m, EF 55-65%.  . OSA (obstructive sleep apnea)   . Osteoarthritis    a. s/p bilat hip replacements.  Marland Kitchen UTI (urinary tract infection)    a. 09/2017 admit for E coli & Klebsiella UTI.  . Ventral hernia      Review of Systems  Gen: Denies fever, chills, weight change, fatigue, night sweats HEENT: Denies blurred vision, double vision, hearing loss, tinnitus, sinus congestion, rhinorrhea, sore throat, neck stiffness, dysphagia PULM: Denies shortness of breath, + cough, sputum production,  hemoptysis, wheezing CV: Denies chest pain, edema, orthopnea, paroxysmal nocturnal dyspnea, palpitations       Objective:   Physical Exam  Vitals:   11/28/17 1424  BP: 128/80  Pulse: 94  SpO2: 94%  Weight: 202 lb (91.6 kg)  Height: 5' (1.524 m)  RA  Gen: chronically ill appearing HENT: OP clear, TM's clear, neck supple PULM: Crackles bases B, normal percussion CV: RRR, no mgr, trace edema GI: BS+, soft, nontender Derm: no cyanosis or rash Psyche: normal mood and affect    Imaging: May 2014 chest x-ray no acute cardiopulmonary abnormality, questionable hyperinflation 01/2013 CT chest 88mm sub pleural nodule, mild emphysema 01/2013 CT  chest 83mm sub pleural nodule, mild emphysema 04/22/2013 CT chest > 37mm nodule unchanged 08/2014 CT chest > nodule unchanged, progressive interstitial infilatrate bilateral lower lobes of uncertain etiology April 2017 high-resolution CT chest: Findings worrisome for slightly progressive but nonspecific interstitial lung disease. 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 (Personally reviewed again 03/18/2016). 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 May be due to interstitial lung disease such as nonspecific interstitial pneumonitis. 08/10/2016 modified barium swallow showed dysphagia and moderate aspiration risk May 2019 chest x-ray images independently reviewed showing increased interstitial infiltrate particularly in the right base.  PFT: June 2014 full pulmonary function test at [facility] ratio 80%, FEV1 1.74 L (101% predicted) total lung capacity is 3.81 L (91% predicted), DLCO 11.0 (56% predicted) 09/2015 PFT repeat > 86% FVC 2.03L (85% pred), FEV1 1.72L (96% pred), TLC 3.98L (89% pred), DLCO 12.12 (64% pred) November 2017 pulmonary function testing ratio normal, FEV1 1.73 L, 98% predicted, FVC 1.98 L 83% predicted, total lung capacity 3.90 L 87% predicted, DLCO 12.29 65% predicted March 2018 pulmonary function testing ratio normal, FEV1 1.53 L 87% predicted, FVC 1.75 L 74% predicted, total lung capacity 4.0 L 89% predicted, residual volume normal, ERV 0.13 L 29% predicted, DLCO 12.13 64% predicted March 2018 MIP/MEP> expiratory max 28% predicted, and sponge Remax 68% predicted November 28, 2017 forced vital capacity 1.52 L, normal ratio, total lung capacity 3.35 L 75% predicted, DLCO 12.86 mL 68%  predicted  Heart catheterization: February 2018 by Dr. Rockey Situ. Proximal left anterior descending 50% obstruction, mid RCA 30%, LVEF 55-65%, LVEDP moderately elevated at 28 mmHg, right heart catheterization showed a mean PA pressure of 28, pulmonary capillary wedge pressure 19.  6 minute walk: October 2018 240 m, O2 saturation 91% on room air  Labs: 12/2015 ANA, CCP, RF, SCL-70, Jo-1, SSA, SSB, Aldolase all negative  May 2019 hospital discharge summary reviewed where the patient had a UTI and was treated with Cipro, she also had a COPD exacerbation     Assessment & Plan:   Centrilobular emphysema ([place])  Interstitial pulmonary disease ([place]) - Plan: CT Chest High Resolution  Dysphagia, unspecified type  Aspiration into airway, initial encounter  Discussion: I am concerned about the [place] because her pulmonary function testing is worse and her symptoms are clearly worse.  Specifically, her interstitial lung disease related to Tronic aspiration think is probably worsening.  She also has emphysema deconditioning which contribute to the severity of her shortness of breath.  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: We will check a high-resolution CT scan of the chest because of your worsening  shortness of breath to see if there is anything else going on  Chronic aspiration: Continue to follow the recommendations from the speech therapist, specifically clearing out her airway after each swallow  Centrilobular emphysema: Start taking DuoNeb 3 times a day  Chronic respiratory failure with hypoxemia: Keep using 2 L of oxygen continuously, we will see if we can get a prescription from your primary care physician's office to the DME company for a portable oxygen concentrator  We will see you back in about a month to go over the results of the high-resolution CT scan of your chest, sooner if needed   Updated Medication List Outpatient Encounter Medications as of  11/28/2017  Medication Sig  . albuterol (PROVENTIL HFA;VENTOLIN HFA) 108 (90 Base) MCG/ACT inhaler Inhale 2 puffs into the lungs every 6 (six) hours as needed for wheezing or shortness of breath.  . budesonide-formoterol (SYMBICORT) 160-4.5 MCG/ACT inhaler TAKE 2 PUFFS INTO LUNGS TWICE A DAY (Patient taking differently: Inhale 2 puffs into the lungs 2 (two) times daily. )  . diltiazem (CARDIZEM CD) 180 MG 24 hr capsule Take 1 capsule (180 mg total) by mouth daily.  Marland Kitchen ibuprofen (ADVIL,MOTRIN) 200 MG tablet Take 600 mg by mouth every 6 (six) hours as needed for headache or moderate pain.   . meclizine (ANTIVERT) 25 MG tablet Take 1 tablet (25 mg total) by mouth 3 (three) times daily.  . Melatonin 10 MG TABS Take 15 mg by mouth at bedtime.  . OXYGEN Inhale 2 L into the lungs daily.  Marland Kitchen rOPINIRole (REQUIP) 3 MG tablet Take 3 mg by mouth at bedtime.  Marland Kitchen tiotropium (SPIRIVA) 18 MCG inhalation capsule Place 1 capsule (18 mcg total) into inhaler and inhale daily.  . traZODone (DESYREL) 50 MG tablet 1 AND 1/2 TABLETS BY MOUTH AT BEDTIME ASNEEDED FOR SLEEP (Patient taking differently: TAKE 75MG  BY MOUTH AT BEDTIME ASNEEDED FOR SLEEP)  . ipratropium-albuterol (DUONEB) 0.5-2.5 (3) MG/3ML SOLN Take 3 mLs by nebulization 3 (three) times daily.   Facility-Administered Encounter Medications as of 11/28/2017  Medication  . dexamethasone (DECADRON) injection 4 mg  . ondansetron (ZOFRAN) 4 mg in sodium chloride 0.9 % 50 mL IVPB  . ondansetron (ZOFRAN) 4 mg in sodium chloride 0.9 % 50 mL IVPB

## 2017-11-28 NOTE — Progress Notes (Signed)
PFT completed 11/28/17 patient was unable to complete more than one DLCO.

## 2017-11-29 DIAGNOSIS — J849 Interstitial pulmonary disease, unspecified: Secondary | ICD-10-CM | POA: Diagnosis not present

## 2017-11-29 DIAGNOSIS — K279 Peptic ulcer, site unspecified, unspecified as acute or chronic, without hemorrhage or perforation: Secondary | ICD-10-CM | POA: Diagnosis not present

## 2017-11-29 DIAGNOSIS — K5790 Diverticulosis of intestine, part unspecified, without perforation or abscess without bleeding: Secondary | ICD-10-CM | POA: Diagnosis not present

## 2017-11-29 DIAGNOSIS — J432 Centrilobular emphysema: Secondary | ICD-10-CM | POA: Diagnosis not present

## 2017-11-29 DIAGNOSIS — M1990 Unspecified osteoarthritis, unspecified site: Secondary | ICD-10-CM | POA: Diagnosis not present

## 2017-11-29 DIAGNOSIS — K439 Ventral hernia without obstruction or gangrene: Secondary | ICD-10-CM | POA: Diagnosis not present

## 2017-11-29 DIAGNOSIS — R69 Illness, unspecified: Secondary | ICD-10-CM | POA: Diagnosis not present

## 2017-11-29 DIAGNOSIS — I679 Cerebrovascular disease, unspecified: Secondary | ICD-10-CM | POA: Diagnosis not present

## 2017-11-29 DIAGNOSIS — N390 Urinary tract infection, site not specified: Secondary | ICD-10-CM | POA: Diagnosis not present

## 2017-11-29 DIAGNOSIS — J209 Acute bronchitis, unspecified: Secondary | ICD-10-CM | POA: Diagnosis not present

## 2017-11-29 NOTE — Telephone Encounter (Signed)
Called and spoke to [place] at [facility]. He is going to follow up with her PCP since they have been prescribing the oxygen. Nothing further needed at this time.

## 2017-11-30 ENCOUNTER — Ambulatory Visit: Payer: Medicare HMO | Admitting: Pulmonary Disease

## 2017-12-07 ENCOUNTER — Ambulatory Visit
Admission: RE | Admit: 2017-12-07 | Discharge: 2017-12-07 | Disposition: A | Discharge status: Home / Self Care | Payer: Medicare HMO | Source: Ambulatory Visit | Attending: Pulmonary Disease | Admitting: Pulmonary Disease

## 2017-12-07 DIAGNOSIS — J849 Interstitial pulmonary disease, unspecified: Secondary | ICD-10-CM | POA: Diagnosis not present

## 2017-12-07 DIAGNOSIS — I700 Atherosclerosis of aorta: Secondary | ICD-10-CM | POA: Insufficient documentation

## 2017-12-07 DIAGNOSIS — I2510 Atherosclerotic heart disease of native coronary artery without angina pectoris: Secondary | ICD-10-CM | POA: Diagnosis not present

## 2017-12-07 DIAGNOSIS — D3502 Benign neoplasm of left adrenal gland: Secondary | ICD-10-CM | POA: Diagnosis not present

## 2017-12-14 ENCOUNTER — Telehealth: Payer: Self-pay | Admitting: Pulmonary Disease

## 2017-12-14 NOTE — Telephone Encounter (Signed)
Called pt to give results but there was no answer. Left message for pt to call back.     Notes recorded by Juanito Doom, MD on 12/08/2017 at 5:11 AM EDT BJ, Please let the patient know this showed mild progression Thanks, B

## 2017-12-18 NOTE — Telephone Encounter (Signed)
Attempted to call patient today regarding results. I did not receive an answer at time of call. I have left a voicemail message for pt to return call. X2  Notes recorded by Juanito Doom, MD on 12/08/2017 at 5:11 AM EDT BJ, Please let the patient know this showed mild progression Thanks, B

## 2017-12-19 NOTE — Telephone Encounter (Signed)
Attempted to call patient today regarding results. I did not receive an answer at time of call. I have left a voicemail message forptto return call. X3 Mailed letter to pt today's mail for her to contact our office. Nothing further needed at this time  Notes recorded by Juanito Doom, MD on 12/08/2017 at 5:11 AM EDT BJ, Please let the patient know this showed mild progression Thanks, B

## 2017-12-20 ENCOUNTER — Ambulatory Visit: Payer: Medicare HMO | Admitting: Internal Medicine

## 2017-12-24 DIAGNOSIS — N390 Urinary tract infection, site not specified: Secondary | ICD-10-CM | POA: Diagnosis not present

## 2017-12-27 ENCOUNTER — Encounter: Payer: Self-pay | Admitting: Pulmonary Disease

## 2017-12-27 ENCOUNTER — Ambulatory Visit: Payer: Medicare HMO | Admitting: Pulmonary Disease

## 2017-12-27 VITALS — BP 132/66 | HR 76 | Ht 60.0 in | Wt 214.0 lb

## 2017-12-27 DIAGNOSIS — R1310 Dysphagia, unspecified: Secondary | ICD-10-CM

## 2017-12-27 DIAGNOSIS — J849 Interstitial pulmonary disease, unspecified: Secondary | ICD-10-CM

## 2017-12-27 DIAGNOSIS — R0602 Shortness of breath: Secondary | ICD-10-CM

## 2017-12-27 DIAGNOSIS — J9621 Acute and chronic respiratory failure with hypoxia: Secondary | ICD-10-CM | POA: Diagnosis not present

## 2017-12-27 DIAGNOSIS — J432 Centrilobular emphysema: Secondary | ICD-10-CM

## 2017-12-27 DIAGNOSIS — T17908A Unspecified foreign body in respiratory tract, part unspecified causing other injury, initial encounter: Secondary | ICD-10-CM

## 2017-12-27 NOTE — Patient Instructions (Signed)
Centrilobular emphysema: Use DuoNeb 3 times a day Take Symbicort 2 puffs twice a day  Chronic respiratory failure with hypoxemia: Keep using 2 L of oxygen continuously We will try to prescribe a portable oxygen concentrator, but we may need you to call your family practitioner's office for this  Chronic aspiration: Continue to follow the recommendations from the speech therapist, specifically clearing out your airway after each swallow  Follow-up with me in 3 to 4 months or sooner if needed

## 2017-12-27 NOTE — Progress Notes (Signed)
Subjective:    Patient ID: Susan [name], female    DOB: 1940/12/19, 77 y.o.   MRN: [id number]  Synopsis: [name] first saw the [facility] pulmonary clinic in September 2014 for shortness of breath. She had a previous heavy smoking history but he quit by the time she saw me. Lung function testing was actually not consistent with obstruction but she did have emphysema on her chest CT. Spiriva was started empirically for that. She also had a home sleep study which showed an apnea hypopnea index of 44. A follow-up split-night study showed an AHI of only 18, CPAP was titrated to 10 cm of water but she was only able to wear it for approximately 15 mintes. She has had difficulty using CPAP when prescribed in the past.   As of 02/2017 she has not been compliant with CPAP.   In 2017 she developed worsening fibrotic changes due to silent aspiration.    We have had many discussions about the possibility of an open lung biopsy but she is never been interested in this.  We tried prednisone for several months in 2018 but this did not help her shortness of breath  HPI  Chief Complaint  Patient presents with  . Follow-up    pt states she is doing well.    Susan [name] says that she is actually doing fairly well, though her daughter says that she can only take 2 or 3 steps before she starts to feel very short of breath.  She is using her oxygen continuously at home but she still awaits a portable oxygen concentrator prescription.  She cannot use the tanks when she leaves the house.  She says that she coughs very rarely.  She has not been sick since last visit.  Past Medical History:  Diagnosis Date  . Acute peptic ulcer, unspecified site, with hemorrhage and perforation, without mention of obstruction   . Arthritis   . Asthma   . Balance problems    unsteady on feet at time  . Cerebral amyloid angiopathy ([place])    a. 06/2017 admit w/ aphasia and blurred vision.  RI w/ extensive chronic  microhemorrhages c/w amyloid angiopathy-->ASA initiated.  . Chronic airway obstruction, not elsewhere classified    on inhalers  . Diastolic dysfunction    a. 06/2017 Echo: EF 50-55%, no rwma, Gr1 DD, nl LA size, nl RV fxn.  . Diverticulosis of colon (without mention of hemorrhage)   . Hematochezia   . 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 Morbid obesity ([place])   . Non-obstructive CAD (coronary artery disease)    a. 12/2012 MV: EF 71%, no ischemia; b. 06/2016 Cath: LM nl, LAD 50p, LCX nl, RCA 59m, EF 55-65%.  . OSA (obstructive sleep apnea)   . Osteoarthritis    a. s/p bilat hip replacements.  Marland Kitchen UTI (urinary tract infection)    a. 09/2017 admit for E coli & Klebsiella UTI.  . Ventral hernia      Review of Systems  Gen: Denies fever, chills, weight change, fatigue, night sweats HEENT: Denies blurred vision, double vision, hearing loss, tinnitus, sinus congestion, rhinorrhea, sore throat, neck stiffness, dysphagia PULM: Denies shortness of breath, + cough, sputum production, hemoptysis, wheezing CV: Denies chest pain, edema, orthopnea, paroxysmal nocturnal dyspnea, palpitations       Objective:   Physical Exam  Vitals:   12/27/17 1412  BP: 132/66  Pulse: 76  SpO2: 94%  Weight: 214 lb (97.1 kg)  Height: 5' (1.524  m)  RA  Gen: chronically ill appearing HENT: OP clear, TM's clear, neck supple PULM: Coarse crackles bases of lungs, normal percussion CV: RRR, no mgr, trace edema GI: BS+, soft, nontender Derm: no cyanosis or rash Psyche: normal mood and affect    Imaging: May 2014 chest x-ray no acute cardiopulmonary abnormality, questionable hyperinflation 01/2013 CT chest 81mm sub pleural nodule, mild emphysema 01/2013 CT chest 65mm sub pleural nodule, mild emphysema 04/22/2013 CT chest > 45mm nodule unchanged 08/2014 CT chest > nodule unchanged, progressive interstitial infilatrate bilateral lower lobes of uncertain etiology April 2017 high-resolution CT chest:  Findings worrisome for slightly progressive but nonspecific interstitial lung disease. 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 (Personally reviewed again 03/18/2016). 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 May be due to interstitial lung disease such as nonspecific interstitial pneumonitis. 08/10/2016 modified barium swallow showed dysphagia and moderate aspiration risk May 2019 chest x-ray images independently reviewed showing increased interstitial infiltrate particularly in the right base. 11/2017 HRCT > mild progression of basilar and peripheral predominant interlobular septal thickening and fibrotic change, some bronchiectasis changes  PFT: June 2014 full pulmonary function test at [facility] ratio 80%, FEV1 1.74 L (101% predicted) total lung capacity is 3.81 L (91% predicted), DLCO 11.0 (56% predicted) 09/2015 PFT repeat > 86% FVC 2.03L (85% pred), FEV1 1.72L (96% pred), TLC 3.98L (89% pred), DLCO 12.12 (64% pred) November 2017 pulmonary function testing ratio normal, FEV1 1.73 L, 98% predicted, FVC 1.98 L 83% predicted, total lung capacity 3.90 L 87% predicted, DLCO 12.29 65% predicted March 2018 pulmonary function testing ratio normal, FEV1 1.53 L 87% predicted, FVC 1.75 L 74% predicted, total lung capacity 4.0 L 89% predicted, residual volume normal, ERV 0.13 L 29% predicted, DLCO 12.13 64% predicted March 2018 MIP/MEP> expiratory max 28% predicted, and sponge Remax 68% predicted November 28, 2017 forced vital capacity 1.52 L, normal ratio, total lung capacity 3.35 L 75% predicted, DLCO 12.86 mL 68% predicted  Heart catheterization: February 2018 by Dr. Rockey Situ. Proximal left anterior descending 50%  obstruction, mid RCA 30%, LVEF 55-65%, LVEDP moderately elevated at 28 mmHg, right heart catheterization showed a mean PA pressure of 28, pulmonary capillary wedge pressure 19.  6 minute walk: October 2018 240 m, O2 saturation 91% on room air  Labs: 12/2015 ANA, CCP, RF, SCL-70, Jo-1, SSA, SSB, Aldolase all negative  May 2019 hospital discharge summary reviewed where the patient had a UTI and was treated with Cipro, she also had a COPD exacerbation     Assessment & Plan:   ILD (interstitial lung disease) ([place])  Centrilobular emphysema ([place])  Dysphagia, unspecified type  Shortness of breath  Aspiration into airway, initial encounter  Acute on chronic respiratory failure with hypoxemia ([place])  Discussion: Despite worsening changes on her CT scan, this has been a stable interval for Diego.  She does has very severe dyspnea on exertion which is due to her centrilobular emphysema, chronic aspiration related interstitial lung disease, and severe deconditioning.  She is not interested in more exercise.  Plan: Centrilobular emphysema: Use DuoNeb 3 times a day Take Symbicort 2 puffs twice a day  Chronic respiratory failure with hypoxemia: Keep using 2 L of oxygen continuously We will try to prescribe a portable oxygen concentrator, but we may need  you to call your family practitioner's office for this  Chronic aspiration: Continue to follow the recommendations from the speech therapist, specifically clearing out your airway after each swallow  Follow-up with me in 3 to 4 months or sooner if needed   Updated Medication List Outpatient Encounter Medications as of 12/27/2017  Medication Sig  . albuterol (PROVENTIL HFA;VENTOLIN HFA) 108 (90 Base) MCG/ACT inhaler Inhale 2 puffs into the lungs every 6 (six) hours as needed for wheezing or shortness of breath.  . budesonide-formoterol (SYMBICORT) 160-4.5 MCG/ACT inhaler TAKE 2 PUFFS INTO LUNGS TWICE A DAY (Patient taking differently:  Inhale 2 puffs into the lungs 2 (two) times daily. )  . ciprofloxacin (CIPRO) 500 MG tablet Take 500 mg by mouth 2 (two) times daily.  Marland Kitchen diltiazem (CARDIZEM CD) 180 MG 24 hr capsule Take 1 capsule (180 mg total) by mouth daily.  Marland Kitchen ibuprofen (ADVIL,MOTRIN) 200 MG tablet Take 600 mg by mouth every 6 (six) hours as needed for headache or moderate pain.   Marland Kitchen ipratropium-albuterol (DUONEB) 0.5-2.5 (3) MG/3ML SOLN Take 3 mLs by nebulization 3 (three) times daily.  . Melatonin 10 MG TABS Take 15 mg by mouth at bedtime.  . OXYGEN Inhale 2 L into the lungs daily.  Marland Kitchen rOPINIRole (REQUIP) 3 MG tablet Take 3 mg by mouth at bedtime.  Marland Kitchen tiotropium (SPIRIVA) 18 MCG inhalation capsule Place 1 capsule (18 mcg total) into inhaler and inhale daily.  . traZODone (DESYREL) 50 MG tablet 1 AND 1/2 TABLETS BY MOUTH AT BEDTIME ASNEEDED FOR SLEEP (Patient taking differently: TAKE 75MG  BY MOUTH AT BEDTIME ASNEEDED FOR SLEEP)  . [DISCONTINUED] meclizine (ANTIVERT) 25 MG tablet Take 1 tablet (25 mg total) by mouth 3 (three) times daily. (Patient not taking: Reported on 12/27/2017)   Facility-Administered Encounter Medications as of 12/27/2017  Medication  . dexamethasone (DECADRON) injection 4 mg  . ondansetron (ZOFRAN) 4 mg in sodium chloride 0.9 % 50 mL IVPB  . ondansetron (ZOFRAN) 4 mg in sodium chloride 0.9 % 50 mL IVPB

## 2017-12-27 NOTE — Addendum Note (Signed)
Addended by: Len Blalock on: 12/27/2017 02:43 PM   Modules accepted: Orders

## 2017-12-29 DIAGNOSIS — I110 Hypertensive heart disease with heart failure: Secondary | ICD-10-CM | POA: Diagnosis not present

## 2017-12-29 DIAGNOSIS — G4733 Obstructive sleep apnea (adult) (pediatric): Secondary | ICD-10-CM | POA: Diagnosis not present

## 2017-12-29 DIAGNOSIS — R69 Illness, unspecified: Secondary | ICD-10-CM | POA: Diagnosis not present

## 2017-12-29 DIAGNOSIS — G2581 Restless legs syndrome: Secondary | ICD-10-CM | POA: Diagnosis not present

## 2017-12-29 DIAGNOSIS — J449 Chronic obstructive pulmonary disease, unspecified: Secondary | ICD-10-CM | POA: Diagnosis not present

## 2017-12-29 DIAGNOSIS — J849 Interstitial pulmonary disease, unspecified: Secondary | ICD-10-CM | POA: Diagnosis not present

## 2017-12-29 DIAGNOSIS — Z6841 Body Mass Index (BMI) 40.0 and over, adult: Secondary | ICD-10-CM | POA: Diagnosis not present

## 2017-12-29 DIAGNOSIS — E785 Hyperlipidemia, unspecified: Secondary | ICD-10-CM | POA: Diagnosis not present

## 2017-12-29 DIAGNOSIS — R0603 Acute respiratory distress: Secondary | ICD-10-CM | POA: Diagnosis not present

## 2017-12-29 DIAGNOSIS — J432 Centrilobular emphysema: Secondary | ICD-10-CM | POA: Diagnosis not present

## 2017-12-29 DIAGNOSIS — I509 Heart failure, unspecified: Secondary | ICD-10-CM | POA: Diagnosis not present

## 2017-12-29 DIAGNOSIS — J9610 Chronic respiratory failure, unspecified whether with hypoxia or hypercapnia: Secondary | ICD-10-CM | POA: Diagnosis not present

## 2017-12-29 DIAGNOSIS — J439 Emphysema, unspecified: Secondary | ICD-10-CM | POA: Diagnosis not present

## 2017-12-29 DIAGNOSIS — R0602 Shortness of breath: Secondary | ICD-10-CM | POA: Diagnosis not present

## 2017-12-29 DIAGNOSIS — G4700 Insomnia, unspecified: Secondary | ICD-10-CM | POA: Diagnosis not present

## 2018-01-09 ENCOUNTER — Ambulatory Visit (INDEPENDENT_AMBULATORY_CARE_PROVIDER_SITE_OTHER): Payer: Medicare HMO | Admitting: Family Medicine

## 2018-01-09 ENCOUNTER — Encounter: Payer: Self-pay | Admitting: Family Medicine

## 2018-01-09 VITALS — BP 120/88 | HR 86 | Temp 97.6°F | Ht 60.0 in | Wt 218.5 lb

## 2018-01-09 DIAGNOSIS — J432 Centrilobular emphysema: Secondary | ICD-10-CM

## 2018-01-09 DIAGNOSIS — R030 Elevated blood-pressure reading, without diagnosis of hypertension: Secondary | ICD-10-CM

## 2018-01-09 DIAGNOSIS — J849 Interstitial pulmonary disease, unspecified: Secondary | ICD-10-CM

## 2018-01-09 DIAGNOSIS — Z87440 Personal history of urinary (tract) infections: Secondary | ICD-10-CM

## 2018-01-09 LAB — POC URINALSYSI DIPSTICK (AUTOMATED)
Glucose, UA: NEGATIVE
KETONES UA: NEGATIVE
LEUKOCYTES UA: NEGATIVE
NITRITE UA: NEGATIVE
PROTEIN UA: NEGATIVE
Spec Grav, UA: 1.025 (ref 1.010–1.025)
Urobilinogen, UA: 0.2 E.U./dL
pH, UA: 6 (ref 5.0–8.0)

## 2018-01-09 NOTE — Assessment & Plan Note (Signed)
Good blood pressure control 

## 2018-01-09 NOTE — Progress Notes (Signed)
   Subjective:    Patient ID: Susan [name], female    DOB: 09-Jun-1940, 77 y.o.   MRN: [id number]  HPI 77  year old female  Pt presents for 3 months follow up.   She has recently been treat for UTI at Urgent care with Cipro course x 7 days. Accidentally took 2 tabs at a time instead of twice daily. She has noted fatgiue and ill feeling, cloudy, increase frequency. She has had resolution of  Symptoms. No fever.  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 On oxygen  Continuous but mainly prn. She has been working on pulmonary rehab and trying to increase her stamina.  No chest pain.  She is doing all her own grpocery and cooking and cleaning!  BP well controlled today   Social History /Family History/Past Medical History reviewed in detail and updated in EMR if needed. Blood pressure 120/88, pulse 86, temperature 97.6 F (36.4 C), temperature source Oral, height 5' (1.524 m), weight 218 lb 8 oz (99.1 kg), SpO2 93 %.  Review of Systems  Constitutional: Negative for fatigue and fever.  HENT: Negative for ear pain.   Eyes: Negative for pain.  Respiratory: Positive for shortness of breath. Negative for chest tightness.   Cardiovascular: Negative for chest pain, palpitations and leg swelling.  Gastrointestinal: Negative for abdominal pain.  Genitourinary: Negative for dysuria.       Objective:   Physical Exam  Constitutional: Vital signs are normal. She appears well-developed and well-nourished. She is cooperative.  Non-toxic appearance. She does not appear ill. No distress.  Well dressed and cared for, very positive outlook  HENT:  Head: Normocephalic.  Right Ear: Hearing, tympanic membrane, external ear and ear canal normal. Tympanic membrane is not erythematous, not retracted and not bulging.  Left Ear: Hearing, tympanic membrane, external ear and ear canal normal. Tympanic membrane is not erythematous, not retracted and not bulging.  Nose: No mucosal edema or rhinorrhea. Right  sinus exhibits no maxillary sinus tenderness and no frontal sinus tenderness. Left sinus exhibits no maxillary sinus tenderness and no frontal sinus tenderness.  Mouth/Throat: Uvula is midline, oropharynx is clear and moist and mucous membranes are normal.  Eyes: Pupils are equal, round, and reactive to light. Conjunctivae, EOM and lids are normal. Lids are everted and swept, no foreign bodies found.  Neck: Trachea normal and normal range of motion. Neck supple. Carotid bruit is not present. No thyroid mass and no thyromegaly present.  Cardiovascular: Normal rate, regular rhythm, S1 normal, S2 normal, normal heart sounds, intact distal pulses and normal pulses. Exam reveals no gallop and no friction rub.  No murmur heard. Pulmonary/Chest: Effort normal. No tachypnea. No respiratory distress. She has decreased breath sounds. She has no wheezes. She has no rhonchi. She has no rales.  Abdominal: Soft. Normal appearance and bowel sounds are normal. There is no tenderness.  Neurological: She is alert.  Skin: Skin is warm, dry and intact. No rash noted.  Psychiatric: Her speech is normal and behavior is normal. Judgment and thought content normal. Her mood appears not anxious. Cognition and memory are normal. She does not exhibit a depressed mood.   Foot deformity, 1 plus pitting edema        Assessment & Plan:

## 2018-01-09 NOTE — Addendum Note (Signed)
Addended by: Carter Kitten on: 01/09/2018 10:43 AM   Modules accepted: Orders

## 2018-01-09 NOTE — Assessment & Plan Note (Addendum)
Trying to remain as active as possible.  Using oxygen continuous but takes off occ if sitting and resting.

## 2018-01-09 NOTE — Patient Instructions (Signed)
Keep up with the walking!  Elevate feet above heart when sitting.

## 2018-01-09 NOTE — Assessment & Plan Note (Signed)
Symptoms resolved. Daughter wishes recheck urine with is appropriate given incorrect usage of antibiotics.

## 2018-01-11 NOTE — Progress Notes (Addendum)
Cardiology Office Note  Date:  01/12/2018   ID:  Susan [name], DOB [birth year], MRN [id number]  PCP:  [name], MD   Chief Complaint  Patient presents with  . other    3 month follow up. Pt. c/o shortness of breath with over exertion. "doing well."     HPI:  Susan [name] is a very pleasant 77 year-old woman with a  long history of smoking, 20 ppd smoker quit 15 years ago,  CAD on CT scan,  cath 07/2016, nonobstructive CAD Moderate LVH on echo, normal EF 55% OSA who does not wear her CPAP,  COPD on triple inhaler regiment,  status post bilateral hip replacement,  morbid obesity  who presents for follow up of her  Chronic severe shortness of breath, chest tightness with exertion  In follow-up she reports that she feels well Caretaker presents with her today She is not active, very sedentary, uses a scooter to get around [place] sits in the other room, no regular exercise program Periodically with leg swelling, wonders if she needs a diuretic Now on chronic oxygen Weight continues to run high Pulmonary issues managed by Dr. Lake Bells  Reports high fluid intake  CT scans showing mild emphysema  basilar ILD Exposure hx - hair spray exposure x many decades   EKG personally reviewed by myself on todays visit Shows normal sinus rhythm rate 81 bpm left axis deviation no significant ST or T-wave changes  Other past medical history reviewed cardiac catheterization for worsening shortness of breath, chest tightness concerning for unstable angina cath 07/08/2016  Prox LAD lesion, 50 %stenosed.  Mid RCA lesion, 30 %stenosed.  The left ventricular ejection fraction is 55-65% by visual estimate.  The left ventricular systolic function is normal.  LV end diastolic pressure is moderately elevated. Mildly elevated wedge pressure on right heart cath  Admission to the hospital 03/2016 with acute respiratory distress COPD exacerbation PNA Sepsis due to Escherichia coli and  Klebsiella pneumonia,  Acute pyelonephritis,  CT scan show significant long region of heavy coronary calcification noted in the proximal to mid LAD and proximal RCA .no significant calcifications noted in the left circumflex.   Previous Echo shows moderate concentric LVH, normal LV systolic function, diastolic dysfunction, borderline to mild pulmonary hypertension.   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), Non-obstructive CAD (coronary artery disease), OSA (obstructive sleep apnea), Osteoarthritis, UTI (urinary tract infection), and Ventral hernia.  PSH:    Past Surgical History:  Procedure Laterality Date  . APPENDECTOMY    . BUNIONECTOMY Bilateral   . COSMETIC SURGERY  1970's   abdomen. Tummy Tuck  . FOOT SURGERY Bilateral dec 2015 right    left 3 yrs ago  . RIGHT/LEFT HEART CATH AND CORONARY ANGIOGRAPHY Bilateral 07/08/2016   Procedure: Right/Left Heart Cath and Coronary Angiography;  Surgeon: Minna Merritts, MD;  Location: [facility];  Service: Cardiovascular;  Laterality: Bilateral;  . TOTAL HIP ARTHROPLASTY  2007   left  . TOTAL HIP ARTHROPLASTY     bilateral   . TOTAL KNEE ARTHROPLASTY Right 08/08/2014   Procedure: RIGHT TOTAL KNEE ARTHROPLASTY WITH NAVIGATION;  Surgeon: Rod Can, MD;  Location: [facility];  Service: Orthopedics;  Laterality:  Right;  Marland Kitchen TOTAL KNEE ARTHROPLASTY Left 10/03/2014   Procedure: LEFT TOTAL KNEE ARTHROPLASTY;  Surgeon: Rod Can, MD;  Location: [facility];  Service: Orthopedics;  Laterality: Left;  . TUBAL LIGATION    . UMBILICAL HERNIA REPAIR     2016     Current Outpatient Medications  Medication Sig Dispense Refill  . albuterol (PROVENTIL HFA;VENTOLIN HFA) 108 (90 Base) MCG/ACT inhaler Inhale 2 puffs into the lungs every 6 (six) hours as needed for wheezing or shortness of breath. 1 Inhaler 2  . budesonide-formoterol (SYMBICORT) 160-4.5 MCG/ACT inhaler TAKE 2 PUFFS INTO LUNGS TWICE A DAY (Patient taking differently: Inhale 2 puffs into the lungs 2 (two) times daily. ) 30 g 3  . ciprofloxacin (CIPRO) 500 MG tablet Take 500 mg by mouth 2 (two) times daily.    Marland Kitchen diltiazem (CARDIZEM CD) 180 MG 24 hr capsule Take 1 capsule (180 mg total) by mouth daily. 90 capsule 3  . ibuprofen (ADVIL,MOTRIN) 200 MG tablet Take 600 mg by mouth every 6 (six) hours as needed for headache or moderate pain.     Marland Kitchen ipratropium-albuterol (DUONEB) 0.5-2.5 (3) MG/3ML SOLN Take 3 mLs by nebulization 3 (three) times daily. 360 mL 5  . Melatonin 10 MG TABS Take 15 mg by mouth at bedtime.    . OXYGEN Inhale 2 L into the lungs daily.    Marland Kitchen rOPINIRole (REQUIP) 3 MG tablet Take 3 mg by mouth at bedtime.    Marland Kitchen tiotropium (SPIRIVA) 18 MCG inhalation capsule Place 1 capsule (18 mcg total) into inhaler and inhale daily. 30 capsule 12  . traZODone (DESYREL) 50 MG tablet 1 AND 1/2 TABLETS BY MOUTH AT BEDTIME ASNEEDED FOR SLEEP (Patient taking differently: TAKE 75MG  BY MOUTH AT BEDTIME ASNEEDED FOR SLEEP) 45 tablet 5   No current facility-administered medications for this visit.    Facility-Administered Medications Ordered in Other Visits  Medication Dose Route Frequency Provider Last Rate Last Dose  . dexamethasone (DECADRON) injection 4 mg  4 mg Intravenous Once Rod Can, MD      . ondansetron (ZOFRAN) 4 mg in sodium chloride 0.9 % 50 mL IVPB  4 mg Intravenous Once Rod Can, MD      . ondansetron (ZOFRAN) 4 mg in sodium chloride 0.9 % 50 mL IVPB  4 mg Intravenous Once Rod Can, MD         Allergies:   Patient has no known allergies.   Social History:  The  patient  reports that she quit smoking about 17 years ago. Her smoking use included cigarettes. She has a 20.00 pack-year smoking history. She has never used smokeless tobacco. She reports that she does not drink alcohol or use drugs.   Family History:   family history includes Coronary artery disease in her father and mother; Diabetes in her father and mother; Heart disease in her father and mother; Kidney disease in her father; Lung disease in her mother.    Review of Systems: Review of Systems  Constitutional: Negative.   Respiratory: Positive for shortness of breath.   Cardiovascular: Negative.   Gastrointestinal: Negative.   Musculoskeletal: Negative.   Neurological: Negative.   Psychiatric/Behavioral: Negative.   All other systems reviewed and are negative.    PHYSICAL EXAM: VS:  BP 140/86 (BP Location: Left Arm, Patient Position: Sitting, Cuff Size: Normal)   Pulse 81   Ht 5' (1.524 m)   Wt 208 lb (94.3 kg)   BMI 40.62 kg/m  , BMI Body mass index  is 40.62 kg/m. GEN: Well nourished, well developed, in no acute distress , Obese HEENT: normal  Neck: no JVD, carotid bruits, or masses Cardiac: RRR; no murmurs, rubs, or gallops,no edema  Respiratory:  clear to auscultation bilaterally, normal work of breathing GI: soft, nontender, nondistended, + BS MS: no deformity or atrophy  Skin: warm and dry, no rash Neuro:  Strength and sensation are intact Psych: euthymic mood, full affect    Recent Labs: 07/06/2017: ALT 31 09/28/2017: B Natriuretic Peptide 110.0 09/29/2017: BUN 10; Creatinine, Ser 0.48; Hemoglobin 14.2; Platelets 163; Potassium 3.9; Sodium 139    Lipid Panel Lab Results  Component Value Date   CHOL 126 04/18/2016   HDL 44 04/18/2016   LDLCALC 74 04/18/2016   TRIG 40 04/18/2016      Wt Readings from Last 3 Encounters:  01/12/18 208 lb (94.3 kg)  01/09/18 218 lb 8 oz (99.1 kg)  12/27/17 214 lb (97.1 kg)       ASSESSMENT AND  PLAN:  HYPERCHOLESTEROLEMIA - Plan: EKG 12-Lead Cholesterol is at goal on the current lipid regimen. No changes to the medications were made. stable  Aortic atherosclerosis (HCC) - Plan: EKG 12-Lead Aggressive cholesterol management recommended  Coronary artery disease involving native coronary artery of native heart with unstable angina pectoris ([place]) - Plan: EKG 12-Lead  cardiac catheterization with nonobstructive disease Cholesterol at goal  Obstructive sleep apnea - Plan: EKG 12-Lead Unable to exercise given her comorbidities and morbid obesity Strongly recommended aggressive low carbohydrate diet  Centrilobular emphysema (HCC) - Plan: EKG 12-Lead On chronic oxygen Symptoms exacerbated by morbid obesity  ILD (interstitial lung disease) ([place]) - Plan: EKG 12-Lead Previous spray and exposure, occupational Followed by pulmonary On chronic oxygen  Acute respiratory failure with hypoxia ([place]) - Plan: EKG 12-Lead  mildly elevated wedge pressure on right heart catheterization.  He reports having lower extremity edema She was on Lasix in the past, she is requesting this again to take sparingly New prescription sent in   Total encounter time more than 25 minutes  Greater than 50% was spent in counseling and coordination of care with the patient  Disposition:   F/U  12 months   Orders Placed This Encounter  Procedures  . EKG 12-Lead     Signed, Esmond Plants, M.D., Ph.D. 01/12/2018  [place], [place]

## 2018-01-12 ENCOUNTER — Encounter: Payer: Self-pay | Admitting: Cardiovascular Disease

## 2018-01-12 ENCOUNTER — Ambulatory Visit: Payer: Medicare HMO | Admitting: Cardiovascular Disease

## 2018-01-12 VITALS — BP 140/86 | HR 81 | Ht 60.0 in | Wt 208.0 lb

## 2018-01-12 DIAGNOSIS — I700 Atherosclerosis of aorta: Secondary | ICD-10-CM

## 2018-01-12 DIAGNOSIS — I25110 Atherosclerotic heart disease of native coronary artery with unstable angina pectoris: Secondary | ICD-10-CM

## 2018-01-12 DIAGNOSIS — I10 Essential (primary) hypertension: Secondary | ICD-10-CM | POA: Diagnosis not present

## 2018-01-12 DIAGNOSIS — J432 Centrilobular emphysema: Secondary | ICD-10-CM | POA: Diagnosis not present

## 2018-01-12 DIAGNOSIS — J849 Interstitial pulmonary disease, unspecified: Secondary | ICD-10-CM

## 2018-01-12 DIAGNOSIS — E7800 Pure hypercholesterolemia, unspecified: Secondary | ICD-10-CM | POA: Diagnosis not present

## 2018-01-12 MED ORDER — FUROSEMIDE 20 MG PO TABS: 20.0000 mg | ORAL_TABLET | Freq: Every day | ORAL | 3 refills | Status: DC | PRN

## 2018-01-12 NOTE — Patient Instructions (Signed)
Medication Instructions:   Please take lasix/furosemide as needed with potassium sparingly For leg swelling, shortness of breath, bloating  Labwork:  No new labs needed  Testing/Procedures:  No further testing at this time   Follow-Up: It was a pleasure seeing you in the office today. Please call us if you have new issues that need to be addressed before your next appt.  [phone]  Your physician wants you to follow-up in: 12 months.  You will receive a reminder letter in the mail two months in advance. If you don't receive a letter, please call our office to schedule the follow-up appointment.  If you need a refill on your cardiac medications before your next appointment, please call your pharmacy.  For educational health videos Log in to : [email or web address] Or : [email or web address], password : [email or web address]

## 2018-01-17 DIAGNOSIS — L821 Other seborrheic keratosis: Secondary | ICD-10-CM | POA: Diagnosis not present

## 2018-01-17 DIAGNOSIS — L820 Inflamed seborrheic keratosis: Secondary | ICD-10-CM | POA: Diagnosis not present

## 2018-01-29 DIAGNOSIS — J432 Centrilobular emphysema: Secondary | ICD-10-CM | POA: Diagnosis not present

## 2018-01-29 DIAGNOSIS — R0602 Shortness of breath: Secondary | ICD-10-CM | POA: Diagnosis not present

## 2018-01-29 DIAGNOSIS — J449 Chronic obstructive pulmonary disease, unspecified: Secondary | ICD-10-CM | POA: Diagnosis not present

## 2018-01-29 DIAGNOSIS — R0603 Acute respiratory distress: Secondary | ICD-10-CM | POA: Diagnosis not present

## 2018-01-29 DIAGNOSIS — J849 Interstitial pulmonary disease, unspecified: Secondary | ICD-10-CM | POA: Diagnosis not present

## 2018-01-29 DIAGNOSIS — G4733 Obstructive sleep apnea (adult) (pediatric): Secondary | ICD-10-CM | POA: Diagnosis not present

## 2018-02-09 DIAGNOSIS — R600 Localized edema: Secondary | ICD-10-CM | POA: Diagnosis not present

## 2018-02-09 DIAGNOSIS — T7840XA Allergy, unspecified, initial encounter: Secondary | ICD-10-CM | POA: Diagnosis not present

## 2018-02-23 ENCOUNTER — Other Ambulatory Visit: Payer: Self-pay | Admitting: Family Medicine

## 2018-02-28 DIAGNOSIS — G4733 Obstructive sleep apnea (adult) (pediatric): Secondary | ICD-10-CM | POA: Diagnosis not present

## 2018-02-28 DIAGNOSIS — J849 Interstitial pulmonary disease, unspecified: Secondary | ICD-10-CM | POA: Diagnosis not present

## 2018-02-28 DIAGNOSIS — R0602 Shortness of breath: Secondary | ICD-10-CM | POA: Diagnosis not present

## 2018-02-28 DIAGNOSIS — R0603 Acute respiratory distress: Secondary | ICD-10-CM | POA: Diagnosis not present

## 2018-02-28 DIAGNOSIS — J432 Centrilobular emphysema: Secondary | ICD-10-CM | POA: Diagnosis not present

## 2018-02-28 DIAGNOSIS — J449 Chronic obstructive pulmonary disease, unspecified: Secondary | ICD-10-CM | POA: Diagnosis not present

## 2018-03-07 ENCOUNTER — Other Ambulatory Visit: Payer: Self-pay | Admitting: Family Medicine

## 2018-03-08 NOTE — Telephone Encounter (Signed)
Last office visit 01/09/2018.  Last refilled 09/01/2017 for #90 with 1 refill.  Ok to refill?

## 2018-03-14 ENCOUNTER — Telehealth: Payer: Self-pay | Admitting: Family Medicine

## 2018-03-14 NOTE — Telephone Encounter (Signed)
Left message asking pt to call office please see if pt can come in earlier in the day.  Please r/s to allow more time for the appointment @ 11 with another pt  If so please make the 11 o clock appointment 30 min

## 2018-03-15 NOTE — Telephone Encounter (Signed)
Disregard message. The 11 appointment needed pm appointment.   Pt is ok with the 11:15 appointment

## 2018-03-22 ENCOUNTER — Ambulatory Visit: Payer: Medicare HMO | Admitting: Family Medicine

## 2018-03-31 DIAGNOSIS — R0602 Shortness of breath: Secondary | ICD-10-CM | POA: Diagnosis not present

## 2018-03-31 DIAGNOSIS — J449 Chronic obstructive pulmonary disease, unspecified: Secondary | ICD-10-CM | POA: Diagnosis not present

## 2018-03-31 DIAGNOSIS — R0603 Acute respiratory distress: Secondary | ICD-10-CM | POA: Diagnosis not present

## 2018-03-31 DIAGNOSIS — G4733 Obstructive sleep apnea (adult) (pediatric): Secondary | ICD-10-CM | POA: Diagnosis not present

## 2018-03-31 DIAGNOSIS — J849 Interstitial pulmonary disease, unspecified: Secondary | ICD-10-CM | POA: Diagnosis not present

## 2018-03-31 DIAGNOSIS — J432 Centrilobular emphysema: Secondary | ICD-10-CM | POA: Diagnosis not present

## 2018-04-04 ENCOUNTER — Telehealth: Payer: Self-pay | Admitting: Family Medicine

## 2018-04-04 DIAGNOSIS — E7800 Pure hypercholesterolemia, unspecified: Secondary | ICD-10-CM

## 2018-04-04 NOTE — Telephone Encounter (Signed)
-----   Message from Eustace Pen, LPN sent at 16/43/5391  1:43 PM EST ----- Regarding: Labs 11/14 Lab orders needed. Thank you.  Insurance:  [employer]

## 2018-04-05 ENCOUNTER — Ambulatory Visit: Payer: Medicare HMO

## 2018-04-09 ENCOUNTER — Ambulatory Visit: Payer: Medicare HMO

## 2018-04-11 ENCOUNTER — Ambulatory Visit: Payer: Medicare HMO

## 2018-04-13 ENCOUNTER — Ambulatory Visit (INDEPENDENT_AMBULATORY_CARE_PROVIDER_SITE_OTHER): Payer: Medicare HMO

## 2018-04-13 ENCOUNTER — Ambulatory Visit (INDEPENDENT_AMBULATORY_CARE_PROVIDER_SITE_OTHER): Payer: Medicare HMO | Admitting: Family Medicine

## 2018-04-13 VITALS — BP 124/82 | HR 73 | Temp 98.4°F | Ht 60.0 in | Wt 214.2 lb

## 2018-04-13 DIAGNOSIS — J432 Centrilobular emphysema: Secondary | ICD-10-CM | POA: Diagnosis not present

## 2018-04-13 DIAGNOSIS — E782 Mixed hyperlipidemia: Secondary | ICD-10-CM

## 2018-04-13 DIAGNOSIS — J849 Interstitial pulmonary disease, unspecified: Secondary | ICD-10-CM | POA: Diagnosis not present

## 2018-04-13 DIAGNOSIS — Z0000 Encounter for general adult medical examination without abnormal findings: Secondary | ICD-10-CM

## 2018-04-13 DIAGNOSIS — F5104 Psychophysiologic insomnia: Secondary | ICD-10-CM

## 2018-04-13 DIAGNOSIS — Z23 Encounter for immunization: Secondary | ICD-10-CM

## 2018-04-13 DIAGNOSIS — R569 Unspecified convulsions: Secondary | ICD-10-CM | POA: Diagnosis not present

## 2018-04-13 DIAGNOSIS — E7800 Pure hypercholesterolemia, unspecified: Secondary | ICD-10-CM

## 2018-04-13 DIAGNOSIS — G2581 Restless legs syndrome: Secondary | ICD-10-CM

## 2018-04-13 DIAGNOSIS — R69 Illness, unspecified: Secondary | ICD-10-CM | POA: Diagnosis not present

## 2018-04-13 DIAGNOSIS — F0150 Vascular dementia without behavioral disturbance: Secondary | ICD-10-CM

## 2018-04-13 DIAGNOSIS — E2839 Other primary ovarian failure: Secondary | ICD-10-CM

## 2018-04-13 MED ORDER — BUDESONIDE-FORMOTEROL FUMARATE 160-4.5 MCG/ACT IN AERO: INHALATION_SPRAY | RESPIRATORY_TRACT | 11 refills | Status: DC

## 2018-04-13 MED ORDER — ROPINIROLE HCL 3 MG PO TABS: 3.0000 mg | ORAL_TABLET | Freq: Every day | ORAL | 1 refills | Status: DC

## 2018-04-13 MED ORDER — TRAZODONE HCL 50 MG PO TABS: ORAL_TABLET | ORAL | 1 refills | Status: AC

## 2018-04-13 NOTE — Progress Notes (Signed)
Subjective:   Susan [name] is a 77 y.o. female who presents for Medicare Annual (Subsequent) preventive examination.  Review of Systems:  N/A Cardiac Risk Factors include: advanced age (>32men, >72 women);dyslipidemia;obesity (BMI >30kg/m2);hypertension     Objective:     Vitals: BP 124/82 (BP Location: Left Arm, Patient Position: Sitting, Cuff Size: Normal)   Pulse 73   Temp 98.4 F (36.9 C) (Oral)   Ht 5' (1.524 m)   Wt 214 lb 4 oz (97.2 kg)   SpO2 92%   BMI 41.84 kg/m   Body mass index is 41.84 kg/m.  Advanced Directives 04/13/2018 09/28/2017 07/06/2017 07/06/2017 03/27/2017 09/19/2016 04/18/2016  Does Patient Have a Medical Advance Directive? Yes Yes Yes Yes No No Yes  Type of Paramedic of [place];Living will [place];Living will [place];Living will - - - Living will;Healthcare Power of Attorney  Does patient want to make changes to medical advance directive? - No - Patient declined No - Patient declined - - - No - Patient declined  Copy of [place] in Chart? No - copy requested No - copy requested No - copy requested - - - Yes    Tobacco Social History   Tobacco Use  Smoking Status Former Smoker  . Packs/day: 0.50  . Years: 40.00  . Pack years: 20.00  . Types: Cigarettes  . Last attempt to quit: 08/29/2000  . Years since quitting: 17.6  Smokeless Tobacco Never Used     Counseling given: No   Clinical Intake:  Pre-visit preparation completed: Yes  Pain : No/denies pain Pain Score: 0-No pain     Nutritional Status: BMI > 30  Obese Nutritional Risks: None Diabetes: No  How often do you need to have someone help you when you read instructions, pamphlets, or other written materials from your doctor or pharmacy?: 2 - Rarely What is the last grade level you completed in school?: 12th grade  Interpreter Needed?: No  Comments: pt is a widow and lives alone Information  entered by :: LPinson, LPN  Past Medical History:  Diagnosis Date  . Acute peptic ulcer, unspecified site, with hemorrhage and perforation, without mention of obstruction   . Arthritis   . Asthma   . Balance problems    unsteady on feet at time  . Cerebral amyloid angiopathy ([place])    a. 06/2017 admit w/ aphasia and blurred vision.  RI w/ extensive chronic microhemorrhages c/w amyloid angiopathy-->ASA initiated.  . Chronic airway obstruction, not elsewhere classified    on inhalers  . Diastolic dysfunction    a. 06/2017 Echo: EF 50-55%, no rwma, Gr1 DD, nl LA size, nl RV fxn.  . Diverticulosis of colon (without mention of hemorrhage)   . Hematochezia   . 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 Morbid obesity ([place])   . Non-obstructive CAD (coronary artery disease)    a. 12/2012 MV: EF 71%, no ischemia; b. 06/2016 Cath: LM nl, LAD 50p, LCX nl, RCA 37m, EF 55-65%.  . OSA (obstructive sleep apnea)   . Osteoarthritis    a. s/p bilat hip replacements.  Marland Kitchen UTI (urinary tract infection)    a. 09/2017 admit for E coli & Klebsiella UTI.  . Ventral hernia    Past Surgical History:  Procedure Laterality Date  . APPENDECTOMY    . BUNIONECTOMY Bilateral   . COSMETIC SURGERY  1970's   abdomen. Tummy Tuck  . FOOT SURGERY Bilateral dec  2015 right    left 3 yrs ago  . RIGHT/LEFT HEART CATH AND CORONARY ANGIOGRAPHY Bilateral 07/08/2016   Procedure: Right/Left Heart Cath and Coronary Angiography;  Surgeon: Minna Merritts, MD;  Location: [facility];  Service: Cardiovascular;  Laterality: Bilateral;  . TOTAL HIP ARTHROPLASTY  2007   left  . TOTAL HIP ARTHROPLASTY     bilateral   . TOTAL KNEE ARTHROPLASTY Right 08/08/2014   Procedure: RIGHT TOTAL KNEE ARTHROPLASTY WITH NAVIGATION;  Surgeon: Rod Can, MD;  Location: [facility];  Service: Orthopedics;  Laterality: Right;  . TOTAL KNEE ARTHROPLASTY Left 10/03/2014   Procedure: LEFT TOTAL KNEE ARTHROPLASTY;  Surgeon: Rod Can,  MD;  Location: [facility];  Service: Orthopedics;  Laterality: Left;  . TUBAL LIGATION    . UMBILICAL HERNIA REPAIR     2016   Family History  Problem Relation Age of Onset  . Diabetes Mother   . Coronary artery disease Mother   . Lung disease Mother   . Heart disease Mother        CAD  . Coronary artery disease Father   . Kidney disease Father   . Diabetes Father   . Heart disease Father        CAD  . Cancer Neg Hx    Social History   Socioeconomic History  . Marital status: Divorced    Spouse name: Not on file  . Number of children: 3  . Years of education: Not on file  . Highest education level: Not on file  Occupational History  . Occupation: self employeed    Comment: Owns [employer]  Social Needs  . Financial resource strain: Not on file  . Food insecurity:    Worry: Not on file    Inability: Not on file  . Transportation needs:    Medical: Not on file    Non-medical: Not on file  Tobacco Use  . Smoking status: Former Smoker    Packs/day: 0.50    Years: 40.00    Pack years: 20.00    Types: Cigarettes    Last attempt to quit: 08/29/2000    Years since quitting: 17.6  . Smokeless tobacco: Never Used  Substance and Sexual Activity  . Alcohol use: No    Alcohol/week: 0.0 standard drinks    Comment: rare  . Drug use: No  . Sexual activity: Not Currently  Lifestyle  . Physical activity:    Days per week: Not on file    Minutes per session: Not on file  . Stress: Not on file  Relationships  . Social connections:    Talks on phone: Not on file    Gets together: Not on file    Attends religious service: Not on file    Active member of club or organization: Not on file    Attends meetings of clubs or organizations: Not on file    Relationship status: Not on file  Other Topics Concern  . Not on file  Social History Narrative   Diet: Moderate, low fat, skips meals, some fruit and veggies   Regular exercise:  No   HCPOA is [name] and [name],   Has living will, Not sure of code status (reviewed 2013)    Outpatient Encounter Medications as of 04/13/2018  Medication Sig  . albuterol (PROVENTIL HFA;VENTOLIN HFA) 108 (90 Base) MCG/ACT inhaler Inhale 2 puffs into the lungs every 6 (six) hours as needed for wheezing or shortness of breath.  Marland Kitchen  budesonide-formoterol (SYMBICORT) 160-4.5 MCG/ACT inhaler TAKE 2 PUFFS INTO LUNGS TWICE A DAY (Patient taking differently: Inhale 2 puffs into the lungs 2 (two) times daily. )  . ciprofloxacin (CIPRO) 500 MG tablet Take 500 mg by mouth 2 (two) times daily.  . furosemide (LASIX) 20 MG tablet Take 1 tablet (20 mg total) by mouth daily as needed.  Marland Kitchen ibuprofen (ADVIL,MOTRIN) 200 MG tablet Take 600 mg by mouth every 6 (six) hours as needed for headache or moderate pain.   Marland Kitchen ipratropium-albuterol (DUONEB) 0.5-2.5 (3) MG/3ML SOLN Take 3 mLs by nebulization 3 (three) times daily.  . Melatonin 10 MG TABS Take 15 mg by mouth at bedtime.  . OXYGEN Inhale 2 L into the lungs daily.  Marland Kitchen rOPINIRole (REQUIP) 3 MG tablet Take 3 mg by mouth at bedtime.  Marland Kitchen rOPINIRole (REQUIP) 3 MG tablet TAKE 1 TABLET BY MOUTH AT BEDTIME  . tiotropium (SPIRIVA) 18 MCG inhalation capsule Place 1 capsule (18 mcg total) into inhaler and inhale daily.  . traZODone (DESYREL) 50 MG tablet TAKE 1 & 1/2 TABLETS BY MOUTH AT BEDTIMEAS NEEDED FOR SLEEP  . diltiazem (CARDIZEM CD) 180 MG 24 hr capsule Take 1 capsule (180 mg total) by mouth daily.   Facility-Administered Encounter Medications as of 04/13/2018  Medication  . dexamethasone (DECADRON) injection 4 mg  . ondansetron (ZOFRAN) 4 mg in sodium chloride 0.9 % 50 mL IVPB  . ondansetron (ZOFRAN) 4 mg in sodium chloride 0.9 % 50 mL IVPB    Activities of Daily Living In your present state of health, do you have any difficulty performing the following activities: 04/13/2018 09/28/2017  Hearing? N N  Vision? N N  Difficulty concentrating or making decisions? Y N  Walking or climbing stairs? Y Y   Dressing or bathing? N N  Doing errands, shopping? Y N  Preparing Food and eating ? N -  Using the Toilet? N -  In the past six months, have you accidently leaked urine? Y -  Do you have problems with loss of bowel control? Y -  Managing your Medications? Y -  Managing your Finances? Y -  Housekeeping or managing your Housekeeping? N -  Some recent data might be hidden    Patient Care Team: [name], MD as PCP - General Rockey Situ Kathlene November, MD as PCP - Cardiology (Cardiology)    Assessment:   This is a routine wellness examination for Sharron.   Hearing Screening   125Hz  250Hz  500Hz  1000Hz  2000Hz  3000Hz  4000Hz  6000Hz  8000Hz   Right ear:   40 40 40  0    Left ear:   40 40 40  40      Visual Acuity Screening   Right eye Left eye Both eyes  Without correction: 20/40 20/50-1 20/50-1  With correction:       Exercise Activities and Dietary recommendations Current Exercise Habits: The patient does not participate in regular exercise at present, Exercise limited by: orthopedic condition(s)  Goals    . Patient Stated     Starting 04/13/2018, I will continue to take medications as prescribed.        Fall Risk Fall Risk  04/13/2018 10/27/2017 01/12/2016 01/22/2013 01/22/2013  Falls in the past year? 0 No No No No   Depression Screen PHQ 2/9 Scores 04/13/2018 10/27/2017 03/28/2017 01/12/2016  PHQ - 2 Score 0 0 0 0  PHQ- 9 Score 0 - 4 -     Cognitive Function MMSE - Mini Mental State Exam 04/13/2018  Orientation to time 5  Orientation to Place 5  Registration 3  Attention/ Calculation 0  Recall 3  Language- name 2 objects 0  Language- repeat 1  Language- follow 3 step command 2  Language- follow 3 step command-comments unable to follow 1 step to 3 step command, despite several cues  Language- read & follow direction 0  Write a sentence 0  Copy design 0  Total score 19     PLEASE NOTE: A Mini-Cog screen was completed. Maximum score is 20. A value of 0 denotes this part  of [place] MMSE was not completed or the patient failed this part of the Mini-Cog screening.   Mini-Cog Screening Orientation to Time - Max 5 pts Orientation to Place - Max 5 pts Registration - Max 3 pts Recall - Max 3 pts Language Repeat - Max 1 pts Language Follow 3 Step Command - Max 3 pts     Immunization History  Administered Date(s) Administered  . Influenza Split 03/12/2012  . Influenza Whole 03/25/2009  . Influenza, High Dose Seasonal PF 03/07/2017, 04/13/2018  . Influenza,inj,Quad PF,6+ Mos 01/22/2013, 03/03/2014, 02/10/2015, 01/12/2016  . Pneumococcal Conjugate-13 03/03/2014  . Pneumococcal Polysaccharide-23 06/18/2008  . Td 06/18/2008  . Zoster 05/23/2005    Screening Tests Health Maintenance  Topic Date Due  . MAMMOGRAM  05/23/2019 (Originally 01/01/2015)  . TETANUS/TDAP  06/18/2018  . INFLUENZA VACCINE  Completed  . DEXA SCAN  Completed  . PNA vac Low Risk Adult  Completed      Plan:     I have personally reviewed, addressed, and noted the following in the patient's chart:  A. Medical and social history B. Use of alcohol, tobacco or illicit drugs  C. Current medications and supplements D. Functional ability and status E.  Nutritional status F.  Physical activity G. Advance directives H. List of other physicians I.  Hospitalizations, surgeries, and ER visits in previous 12 months J.  [place] to include hearing, vision, cognitive, depression L. Referrals and appointments - none  In addition, I have reviewed and discussed with patient certain preventive protocols, quality metrics, and best practice recommendations. A written personalized care plan for preventive services as well as general preventive health recommendations were provided to patient.  See attached scanned questionnaire for additional information.   Signed,   Lindell Noe, MHA, BS, LPN Health Coach

## 2018-04-13 NOTE — Patient Instructions (Signed)
Susan [name] , Thank you for taking time to come for your Medicare Wellness Visit. I appreciate your ongoing commitment to your health goals. Please review the following plan we discussed and let me know if I can assist you in the future.   These are the goals we discussed: Goals    . Patient Stated     Starting 04/13/2018, I will continue to take medications as prescribed.        This is a list of the screening recommended for you and due dates:  Health Maintenance  Topic Date Due  . Mammogram  01/01/2015  . Flu Shot  12/21/2017  . Tetanus Vaccine  06/18/2018  . DEXA scan (bone density measurement)  Completed  . Pneumonia vaccines  Completed   Preventive Care for Adults  A healthy lifestyle and preventive care can promote health and wellness. Preventive health guidelines for adults include the following key practices.  . A routine yearly physical is a good way to check with your health care provider about your health and preventive screening. It is a chance to share any concerns and updates on your health and to receive a thorough exam.  . Visit your dentist for a routine exam and preventive care every 6 months. Brush your teeth twice a day and floss once a day. Good oral hygiene prevents tooth decay and gum disease.  . The frequency of eye exams is based on your age, health, family medical history, use  of contact lenses, and other factors. Follow your health care provider's recommendations for frequency of eye exams.  . Eat a healthy diet. Foods like vegetables, fruits, whole grains, low-fat dairy products, and lean protein foods contain the nutrients you need without too many calories. Decrease your intake of foods high in solid fats, added sugars, and salt. Eat the right amount of calories for you. Get information about a proper diet from your health care provider, if necessary.  . Regular physical exercise is one of the most important things you can do for your health. Most  adults should get at least 150 minutes of moderate-intensity exercise (any activity that increases your heart rate and causes you to sweat) each week. In addition, most adults need muscle-strengthening exercises on 2 or more days a week.  Silver Sneakers may be a benefit available to you. To determine eligibility, you may visit the website: www.silversneakers.com or contact program at 4104505666 Mon-Fri between 8AM-8PM.   . Maintain a healthy weight. The body mass index (BMI) is a screening tool to identify possible weight problems. It provides an estimate of body fat based on height and weight. Your health care provider can find your BMI and can help you achieve or maintain a healthy weight.   For adults 20 years and older: ? A BMI below 18.5 is considered underweight. ? A BMI of 18.5 to 24.9 is normal. ? A BMI of 25 to 29.9 is considered overweight. ? A BMI of 30 and above is considered obese.   . Maintain normal blood lipids and cholesterol levels by exercising and minimizing your intake of saturated fat. Eat a balanced diet with plenty of fruit and vegetables. Blood tests for lipids and cholesterol should begin at age 34 and be repeated every 5 years. If your lipid or cholesterol levels are high, you are over 50, or you are at high risk for heart disease, you may need your cholesterol levels checked more frequently. Ongoing high lipid and cholesterol levels should be treated with  medicines if diet and exercise are not working.  . If you smoke, find out from your health care provider how to quit. If you do not use tobacco, please do not start.  . If you choose to drink alcohol, please do not consume more than 2 drinks per day. One drink is considered to be 12 ounces (355 mL) of beer, 5 ounces (148 mL) of wine, or 1.5 ounces (44 mL) of liquor.  . If you are 41-14 years old, ask your health care provider if you should take aspirin to prevent strokes.  . Use sunscreen. Apply sunscreen  liberally and repeatedly throughout the day. You should seek shade when your shadow is shorter than you. Protect yourself by wearing long sleeves, pants, a wide-brimmed hat, and sunglasses year round, whenever you are outdoors.  . Once a month, do a whole body skin exam, using a mirror to look at the skin on your back. Tell your health care provider of new moles, moles that have irregular borders, moles that are larger than a pencil eraser, or moles that have changed in shape or color.

## 2018-04-13 NOTE — Addendum Note (Signed)
Addended by: Lendon Collar on: 04/13/2018 04:14 PM   Modules accepted: Orders

## 2018-04-13 NOTE — Patient Instructions (Addendum)
Please stop at the lab to have labs drawn.   Walk down hall with walker daily. Please stop at the front desk to set up referral.

## 2018-04-13 NOTE — Progress Notes (Signed)
Subjective:    Patient ID: Susan [name], female    DOB: 1940-09-28, 77 y.o.   MRN: [id number]  HPI  The patient presents for complete physical and review of chronic health problems. He/She also has the following acute concerns today:none  The patient saw Candis Musa, LPN for medicare wellness. Note reviewed in detail and important notes copied below.   04/13/18 Today:   chronic insomnia:  Using trazodone for sleep.. Working  Well.   RLS: well controlled on requip.  Elevated Cholesterol: Due for re-eval. Using medications without problems: Muscle aches:  Diet compliance: moderate. Exercise:none  Hypertension:   good control on Cardizem.. Using medication without problems or lightheadedness: none Chest pain with exertion: none Edema:none Short of breath: stable, but not great Average home BPs: Other issues: Other complaints:  COPD, emphysema, ILD: on oxygen, followed by [facility]  ? Seizure do: followed by Dr. Manuella Ghazi ... Taken off keppra given SE.  Dementia:, stable followed by neurology.  Social History /Family History/Past Medical History reviewed in detail and updated in EMR if needed. Blood pressure 124/82, pulse 73, temperature 98.4 F (36.9 C), temperature source Oral, height 5' (1.524 m), weight 214 lb 4 oz (97.2 kg), SpO2 92 %.   Review of Systems  Constitutional: Positive for fatigue. Negative for fever and unexpected weight change.  HENT: Negative for congestion, ear pain, sinus pressure, sneezing, sore throat and trouble swallowing.   Eyes: Negative for pain and itching.  Respiratory: Positive for shortness of breath. Negative for cough and wheezing.   Cardiovascular: Negative for chest pain, palpitations and leg swelling.  Gastrointestinal: Negative for abdominal pain, blood in stool, constipation, diarrhea and nausea.  Genitourinary: Negative for difficulty urinating, dysuria, hematuria, menstrual problem and vaginal discharge.  Skin: Negative  for rash.  Neurological: Negative for syncope, weakness, light-headedness, numbness and headaches.  Psychiatric/Behavioral: Negative for confusion and dysphoric mood. The patient is not nervous/anxious.        Objective:   Physical Exam Constitutional:      General: She is not in acute distress.    Appearance: Normal appearance. She is well-developed. She is not ill-appearing or toxic-appearing.     Comments: Well dressed and cared for, very positive outlook  HENT:     Head: Normocephalic.     Right Ear: Hearing, tympanic membrane, ear canal and external ear normal. Tympanic membrane is not erythematous, retracted or bulging.     Left Ear: Hearing, tympanic membrane, ear canal and external ear normal. Tympanic membrane is not erythematous, retracted or bulging.     Nose: No mucosal edema or rhinorrhea.     Right Sinus: No maxillary sinus tenderness or frontal sinus tenderness.     Left Sinus: No maxillary sinus tenderness or frontal sinus tenderness.     Mouth/Throat:     Pharynx: Uvula midline.  Eyes:     General: Lids are normal. Lids are everted, no foreign bodies appreciated.     Conjunctiva/sclera: Conjunctivae normal.     Pupils: Pupils are equal, round, and reactive to light.  Neck:     Musculoskeletal: Normal range of motion and neck supple.     Thyroid: No thyroid mass or thyromegaly.     Vascular: No carotid bruit.     Trachea: Trachea normal.  Cardiovascular:     Rate and Rhythm: Normal rate and regular rhythm.     Pulses: Normal pulses.     Heart sounds: Normal heart sounds, S1 normal and S2 normal. No murmur.  No friction rub. No gallop.   Pulmonary:     Effort: Pulmonary effort is normal. No tachypnea or respiratory distress.     Breath sounds: Decreased breath sounds present. No wheezing, rhonchi or rales.  Abdominal:     General: Bowel sounds are normal.     Palpations: Abdomen is soft.     Tenderness: There is no abdominal tenderness.  Skin:    General: Skin  is warm and dry.     Findings: No rash.  Neurological:     Mental Status: She is alert.  Psychiatric:        Mood and Affect: Mood is not anxious or depressed.        Speech: Speech normal.        Behavior: Behavior normal. Behavior is cooperative.        Thought Content: Thought content normal.        Judgment: Judgment normal.           Assessment & Plan:  The patient's preventative maintenance and recommended screening tests for an annual wellness exam were reviewed in full today. Brought up to date unless services declined.  Counselled on the importance of diet, exercise, and its role in overall health and mortality. The patient's FH and SH was reviewed, including their home life, tobacco status, and drug and alcohol status.   Vaccines: uptodate Pap/DVE: not indicated Mammo:  Not interested, understands risk. Bone Density:  2013, due now q 5 years Colon: Dr. Sharlett Iles. Procedure date: 10/29/2007 .. sre is not interested at thistime Smoking Status:none ETOH/ drug UJW:[id number]

## 2018-04-13 NOTE — Progress Notes (Signed)
PCP notes:   Health maintenance:  Flu vaccine - administered Mammogram - postponed  Abnormal screenings:   Hearing - failed  Hearing Screening   125Hz  250Hz  500Hz  1000Hz  2000Hz  3000Hz  4000Hz  6000Hz  8000Hz   Right ear:   40 40 40  0    Left ear:   40 40 40  40     Mini-Cog score: 19/20 MMSE - Mini Mental State Exam 04/13/2018  Orientation to time 5  Orientation to Place 5  Registration 3  Attention/ Calculation 0  Recall 3  Language- name 2 objects 0  Language- repeat 1  Language- follow 3 step command 2  Language- follow 3 step command-comments unable to follow 1 step to 3 step command, despite several cues  Language- read & follow direction 0  Write a sentence 0  Copy design 0  Total score 19    Patient concerns:   None  Nurse concerns:  None  Next PCP appt:   04/13/18 @ 1500

## 2018-04-14 LAB — COMPREHENSIVE METABOLIC PANEL
AG RATIO: 1.5 (calc) (ref 1.0–2.5)
ALBUMIN MSPROF: 4 g/dL (ref 3.6–5.1)
ALT: 16 U/L (ref 6–29)
AST: 16 U/L (ref 10–35)
Alkaline phosphatase (APISO): 84 U/L (ref 33–130)
BUN: 19 mg/dL (ref 7–25)
CHLORIDE: 105 mmol/L (ref 98–110)
CO2: 25 mmol/L (ref 20–32)
CREATININE: 0.84 mg/dL (ref 0.60–0.93)
Calcium: 9 mg/dL (ref 8.6–10.4)
GLOBULIN: 2.7 g/dL (ref 1.9–3.7)
GLUCOSE: 91 mg/dL (ref 65–99)
POTASSIUM: 4.4 mmol/L (ref 3.5–5.3)
SODIUM: 142 mmol/L (ref 135–146)
TOTAL PROTEIN: 6.7 g/dL (ref 6.1–8.1)
Total Bilirubin: 0.9 mg/dL (ref 0.2–1.2)

## 2018-04-14 LAB — LIPID PANEL
Cholesterol: 183 mg/dL (ref <200)
HDL: 44 mg/dL — ABNORMAL LOW (ref >50)
LDL Cholesterol (Calc): 113 mg/dL (calc) — ABNORMAL HIGH
Non-HDL Cholesterol (Calc): 139 mg/dL (calc) — ABNORMAL HIGH (ref <130)
TRIGLYCERIDES: 149 mg/dL (ref <150)
Total CHOL/HDL Ratio: 4.2 (calc) (ref <5.0)

## 2018-04-18 NOTE — Progress Notes (Signed)
I reviewed health advisor's note, was available for consultation, and agree with documentation and plan.  

## 2018-04-30 DIAGNOSIS — R0603 Acute respiratory distress: Secondary | ICD-10-CM | POA: Diagnosis not present

## 2018-04-30 DIAGNOSIS — J449 Chronic obstructive pulmonary disease, unspecified: Secondary | ICD-10-CM | POA: Diagnosis not present

## 2018-04-30 DIAGNOSIS — G4733 Obstructive sleep apnea (adult) (pediatric): Secondary | ICD-10-CM | POA: Diagnosis not present

## 2018-04-30 DIAGNOSIS — J849 Interstitial pulmonary disease, unspecified: Secondary | ICD-10-CM | POA: Diagnosis not present

## 2018-04-30 DIAGNOSIS — J432 Centrilobular emphysema: Secondary | ICD-10-CM | POA: Diagnosis not present

## 2018-04-30 DIAGNOSIS — R0602 Shortness of breath: Secondary | ICD-10-CM | POA: Diagnosis not present

## 2018-05-03 ENCOUNTER — Encounter: Payer: Self-pay | Admitting: Family Medicine

## 2018-05-03 ENCOUNTER — Telehealth: Payer: Self-pay | Admitting: Family Medicine

## 2018-05-03 NOTE — Telephone Encounter (Signed)
Left message asking pt to call office regarding bone density appointment

## 2018-05-07 DIAGNOSIS — Z8673 Personal history of transient ischemic attack (TIA), and cerebral infarction without residual deficits: Secondary | ICD-10-CM | POA: Diagnosis not present

## 2018-05-07 DIAGNOSIS — R69 Illness, unspecified: Secondary | ICD-10-CM | POA: Diagnosis not present

## 2018-05-07 DIAGNOSIS — G4733 Obstructive sleep apnea (adult) (pediatric): Secondary | ICD-10-CM | POA: Diagnosis not present

## 2018-05-07 DIAGNOSIS — G2581 Restless legs syndrome: Secondary | ICD-10-CM | POA: Diagnosis not present

## 2018-05-07 DIAGNOSIS — G40219 Localization-related (focal) (partial) symptomatic epilepsy and epileptic syndromes with complex partial seizures, intractable, without status epilepticus: Secondary | ICD-10-CM | POA: Diagnosis not present

## 2018-05-07 DIAGNOSIS — R4189 Other symptoms and signs involving cognitive functions and awareness: Secondary | ICD-10-CM | POA: Diagnosis not present

## 2018-05-15 NOTE — Assessment & Plan Note (Signed)
followed by Dr. Manuella Ghazi , neurology.

## 2018-05-15 NOTE — Assessment & Plan Note (Signed)
on oxygen, followed by [facility]

## 2018-05-15 NOTE — Assessment & Plan Note (Deleted)
followed by Dr. Manuella Ghazi , neurology.

## 2018-05-15 NOTE — Assessment & Plan Note (Signed)
Re-eval. 

## 2018-05-15 NOTE — Assessment & Plan Note (Signed)
Using trazodone for sleep.. Working  Well.

## 2018-05-15 NOTE — Assessment & Plan Note (Signed)
Well controlled on requip

## 2018-05-15 NOTE — Assessment & Plan Note (Signed)
followed by Dr. Manuella Ghazi ... Taken off keppra given SE.

## 2018-05-31 DIAGNOSIS — G4733 Obstructive sleep apnea (adult) (pediatric): Secondary | ICD-10-CM | POA: Diagnosis not present

## 2018-05-31 DIAGNOSIS — J449 Chronic obstructive pulmonary disease, unspecified: Secondary | ICD-10-CM | POA: Diagnosis not present

## 2018-05-31 DIAGNOSIS — J432 Centrilobular emphysema: Secondary | ICD-10-CM | POA: Diagnosis not present

## 2018-05-31 DIAGNOSIS — R0603 Acute respiratory distress: Secondary | ICD-10-CM | POA: Diagnosis not present

## 2018-05-31 DIAGNOSIS — R0602 Shortness of breath: Secondary | ICD-10-CM | POA: Diagnosis not present

## 2018-05-31 DIAGNOSIS — J849 Interstitial pulmonary disease, unspecified: Secondary | ICD-10-CM | POA: Diagnosis not present

## 2018-06-20 ENCOUNTER — Other Ambulatory Visit: Payer: Medicare HMO

## 2018-07-01 DIAGNOSIS — R0602 Shortness of breath: Secondary | ICD-10-CM | POA: Diagnosis not present

## 2018-07-01 DIAGNOSIS — J449 Chronic obstructive pulmonary disease, unspecified: Secondary | ICD-10-CM | POA: Diagnosis not present

## 2018-07-01 DIAGNOSIS — G4733 Obstructive sleep apnea (adult) (pediatric): Secondary | ICD-10-CM | POA: Diagnosis not present

## 2018-07-01 DIAGNOSIS — J432 Centrilobular emphysema: Secondary | ICD-10-CM | POA: Diagnosis not present

## 2018-07-01 DIAGNOSIS — R0603 Acute respiratory distress: Secondary | ICD-10-CM | POA: Diagnosis not present

## 2018-07-01 DIAGNOSIS — J849 Interstitial pulmonary disease, unspecified: Secondary | ICD-10-CM | POA: Diagnosis not present

## 2018-07-30 DIAGNOSIS — R062 Wheezing: Secondary | ICD-10-CM | POA: Diagnosis not present

## 2018-07-30 DIAGNOSIS — J432 Centrilobular emphysema: Secondary | ICD-10-CM | POA: Diagnosis not present

## 2018-07-30 DIAGNOSIS — J449 Chronic obstructive pulmonary disease, unspecified: Secondary | ICD-10-CM | POA: Diagnosis not present

## 2018-07-30 DIAGNOSIS — J849 Interstitial pulmonary disease, unspecified: Secondary | ICD-10-CM | POA: Diagnosis not present

## 2018-08-03 ENCOUNTER — Ambulatory Visit: Payer: Medicare HMO | Admitting: Family Medicine

## 2018-08-30 DIAGNOSIS — R062 Wheezing: Secondary | ICD-10-CM | POA: Diagnosis not present

## 2018-08-30 DIAGNOSIS — J849 Interstitial pulmonary disease, unspecified: Secondary | ICD-10-CM | POA: Diagnosis not present

## 2018-08-30 DIAGNOSIS — J432 Centrilobular emphysema: Secondary | ICD-10-CM | POA: Diagnosis not present

## 2018-08-30 DIAGNOSIS — J449 Chronic obstructive pulmonary disease, unspecified: Secondary | ICD-10-CM | POA: Diagnosis not present

## 2018-09-29 DIAGNOSIS — J849 Interstitial pulmonary disease, unspecified: Secondary | ICD-10-CM | POA: Diagnosis not present

## 2018-09-29 DIAGNOSIS — R062 Wheezing: Secondary | ICD-10-CM | POA: Diagnosis not present

## 2018-09-29 DIAGNOSIS — J449 Chronic obstructive pulmonary disease, unspecified: Secondary | ICD-10-CM | POA: Diagnosis not present

## 2018-09-29 DIAGNOSIS — J432 Centrilobular emphysema: Secondary | ICD-10-CM | POA: Diagnosis not present

## 2018-10-10 ENCOUNTER — Telehealth: Payer: Self-pay | Admitting: Family Medicine

## 2018-10-10 NOTE — Telephone Encounter (Signed)
Left message asking pt to call office See if pt can do virtual appointment 5/26.  If so please add information to appointment note

## 2018-10-16 ENCOUNTER — Ambulatory Visit: Payer: Medicare HMO | Admitting: Family Medicine

## 2018-10-30 DIAGNOSIS — J432 Centrilobular emphysema: Secondary | ICD-10-CM | POA: Diagnosis not present

## 2018-10-30 DIAGNOSIS — R062 Wheezing: Secondary | ICD-10-CM | POA: Diagnosis not present

## 2018-10-30 DIAGNOSIS — J449 Chronic obstructive pulmonary disease, unspecified: Secondary | ICD-10-CM | POA: Diagnosis not present

## 2018-10-30 DIAGNOSIS — J849 Interstitial pulmonary disease, unspecified: Secondary | ICD-10-CM | POA: Diagnosis not present

## 2018-11-06 DIAGNOSIS — R69 Illness, unspecified: Secondary | ICD-10-CM | POA: Diagnosis not present

## 2018-11-06 DIAGNOSIS — G309 Alzheimer's disease, unspecified: Secondary | ICD-10-CM | POA: Diagnosis not present

## 2018-11-20 MED ORDER — DILTIAZEM HCL ER COATED BEADS 180 MG PO CP24: 180.0000 mg | ORAL_CAPSULE | Freq: Every day | ORAL | 0 refills | Status: DC

## 2018-11-29 DIAGNOSIS — R062 Wheezing: Secondary | ICD-10-CM | POA: Diagnosis not present

## 2018-11-29 DIAGNOSIS — J849 Interstitial pulmonary disease, unspecified: Secondary | ICD-10-CM | POA: Diagnosis not present

## 2018-11-29 DIAGNOSIS — J449 Chronic obstructive pulmonary disease, unspecified: Secondary | ICD-10-CM | POA: Diagnosis not present

## 2018-11-29 DIAGNOSIS — J432 Centrilobular emphysema: Secondary | ICD-10-CM | POA: Diagnosis not present

## 2018-12-30 DIAGNOSIS — J449 Chronic obstructive pulmonary disease, unspecified: Secondary | ICD-10-CM | POA: Diagnosis not present

## 2018-12-30 DIAGNOSIS — R062 Wheezing: Secondary | ICD-10-CM | POA: Diagnosis not present

## 2018-12-30 DIAGNOSIS — J432 Centrilobular emphysema: Secondary | ICD-10-CM | POA: Diagnosis not present

## 2018-12-30 DIAGNOSIS — J849 Interstitial pulmonary disease, unspecified: Secondary | ICD-10-CM | POA: Diagnosis not present

## 2019-01-30 DIAGNOSIS — J432 Centrilobular emphysema: Secondary | ICD-10-CM | POA: Diagnosis not present

## 2019-01-30 DIAGNOSIS — J849 Interstitial pulmonary disease, unspecified: Secondary | ICD-10-CM | POA: Diagnosis not present

## 2019-01-30 DIAGNOSIS — R062 Wheezing: Secondary | ICD-10-CM | POA: Diagnosis not present

## 2019-01-30 DIAGNOSIS — J449 Chronic obstructive pulmonary disease, unspecified: Secondary | ICD-10-CM | POA: Diagnosis not present

## 2019-02-05 ENCOUNTER — Ambulatory Visit: Payer: Medicare HMO | Admitting: Family Medicine

## 2019-02-06 DIAGNOSIS — R69 Illness, unspecified: Secondary | ICD-10-CM | POA: Diagnosis not present

## 2019-02-07 ENCOUNTER — Ambulatory Visit: Payer: Medicare HMO | Admitting: Family Medicine

## 2019-02-12 ENCOUNTER — Ambulatory Visit: Payer: Medicare HMO | Admitting: Family Medicine

## 2019-02-15 ENCOUNTER — Ambulatory Visit: Payer: Medicare HMO | Admitting: Family Medicine

## 2019-02-19 ENCOUNTER — Other Ambulatory Visit: Payer: Self-pay | Admitting: Cardiovascular Disease

## 2019-02-19 NOTE — Telephone Encounter (Signed)
lvm to schedule

## 2019-02-19 NOTE — Telephone Encounter (Signed)
Please schedule for overdue 12 mo F/U with Dr. Rockey Situ. Thank you!

## 2019-02-25 MED ORDER — DILTIAZEM HCL ER COATED BEADS 180 MG PO CP24: 180.0000 mg | ORAL_CAPSULE | Freq: Every day | ORAL | 1 refills | Status: DC

## 2019-02-25 MED ORDER — FUROSEMIDE 20 MG PO TABS: 20.0000 mg | ORAL_TABLET | Freq: Every day | ORAL | 0 refills | Status: DC | PRN

## 2019-03-01 DIAGNOSIS — R062 Wheezing: Secondary | ICD-10-CM | POA: Diagnosis not present

## 2019-03-01 DIAGNOSIS — J449 Chronic obstructive pulmonary disease, unspecified: Secondary | ICD-10-CM | POA: Diagnosis not present

## 2019-03-01 DIAGNOSIS — J849 Interstitial pulmonary disease, unspecified: Secondary | ICD-10-CM | POA: Diagnosis not present

## 2019-03-01 DIAGNOSIS — J432 Centrilobular emphysema: Secondary | ICD-10-CM | POA: Diagnosis not present

## 2019-03-03 NOTE — Progress Notes (Signed)
Cardiology Office Note  Date:  03/05/2019   ID:  MARRI WOJTKOWIAK, DOB Feb 08, 1941, MRN [id number]  PCP:  [name], MD   Chief Complaint  Patient presents with  . Other    12 month follow up. Patient denies chest pain and SOB at this time. Meds reviewed verbally with patient.     HPI:  Ms. 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 Lake Bells CT scans showing mild emphysema, reviewed personally by myself  basilar ILD Exposure hx - hair spray exposure x many decades   EKG personally reviewed by myself on todays visit Shows normal sinus rhythm rate 64 bpm left axis deviation no significant ST or T-wave changes  Other past medical history reviewed cardiac catheterization for worsening shortness of breath, chest tightness concerning for unstable angina cath 07/08/2016  Prox LAD lesion, 50 %stenosed.  Mid RCA lesion, 30 %stenosed.  The left ventricular ejection fraction is 55-65% by visual estimate.  The left ventricular systolic function is normal.  LV end diastolic pressure is moderately elevated. Mildly elevated wedge pressure on right heart cath  Admission to the hospital 03/2016 with acute respiratory distress COPD  exacerbation PNA Sepsis due to Escherichia coli and Klebsiella pneumonia,  Acute pyelonephritis,  CT scan show significant long region of heavy coronary calcification noted in the proximal to mid LAD and proximal RCA .no significant calcifications noted in the left circumflex.   Previous Echo shows moderate concentric LVH, normal LV systolic function, diastolic dysfunction, borderline to mild pulmonary hypertension.   PMH:   has a past medical history of Acute peptic ulcer, unspecified site, with hemorrhage and perforation, without mention of obstruction, Arthritis, Asthma, Balance problems, Cerebral amyloid angiopathy ([place]), Chronic airway obstruction, not elsewhere classified, Diastolic dysfunction, Diverticulosis of colon (without mention of hemorrhage), Hematochezia, Herpes simplex without mention of complication, Morbid obesity ([place]), Non-obstructive CAD (coronary artery disease), OSA (obstructive sleep apnea), Osteoarthritis, UTI (urinary tract infection), and Ventral hernia.  PSH:    Past Surgical History:  Procedure Laterality Date  . APPENDECTOMY    . BUNIONECTOMY Bilateral   . COSMETIC SURGERY  1970's   abdomen. Tummy Tuck  . FOOT SURGERY Bilateral dec 2015 right    left 3 yrs ago  . RIGHT/LEFT HEART CATH AND CORONARY ANGIOGRAPHY Bilateral 07/08/2016   Procedure: Right/Left Heart Cath and Coronary Angiography;  Surgeon: Minna Merritts, MD;  Location: [facility];  Service: Cardiovascular;  Laterality: Bilateral;  . TOTAL HIP ARTHROPLASTY  2007   left  . TOTAL HIP ARTHROPLASTY     bilateral   . TOTAL KNEE ARTHROPLASTY Right 08/08/2014   Procedure: RIGHT TOTAL KNEE ARTHROPLASTY WITH NAVIGATION;  Surgeon: Rod Can, MD;  Location:  [facility];  Service: Orthopedics;  Laterality: Right;  . TOTAL KNEE ARTHROPLASTY Left 10/03/2014   Procedure: LEFT TOTAL KNEE ARTHROPLASTY;  Surgeon: Rod Can, MD;  Location: [facility];  Service: Orthopedics;  Laterality: Left;  .  TUBAL LIGATION    . UMBILICAL HERNIA REPAIR     2016    Current Outpatient Medications  Medication Sig Dispense Refill  . albuterol (PROVENTIL HFA;VENTOLIN HFA) 108 (90 Base) MCG/ACT inhaler Inhale 2 puffs into the lungs every 6 (six) hours as needed for wheezing or shortness of breath. 1 Inhaler 2  . budesonide-formoterol (SYMBICORT) 160-4.5 MCG/ACT inhaler TAKE 2 PUFFS INTO LUNGS TWICE A DAY 1 Inhaler 11  . diltiazem (CARDIZEM CD) 180 MG 24 hr capsule Take 1 capsule (180 mg total) by mouth daily. Please call to schedule appointment for further refills. Thanks! [phone] 30 capsule 1  . furosemide (LASIX) 20 MG tablet Take 1 tablet (20 mg total) by mouth daily as needed. Please call to schedule appointment for further refills. Thanks! [phone] 30 tablet 0  . ibuprofen (ADVIL,MOTRIN) 200 MG tablet Take 600 mg by mouth every 6 (six) hours as needed for headache or moderate pain.     Marland Kitchen ipratropium-albuterol (DUONEB) 0.5-2.5 (3) MG/3ML SOLN Take 3 mLs by nebulization 3 (three) times daily. 360 mL 5  . Melatonin 10 MG TABS Take 15 mg by mouth at bedtime.    . OXYGEN Inhale 2 L into the lungs daily.    Marland Kitchen rOPINIRole (REQUIP) 3 MG tablet Take 1 tablet (3 mg total) by mouth at bedtime. 90 tablet 1  . tiotropium (SPIRIVA) 18 MCG inhalation capsule Place 1 capsule (18 mcg total) into inhaler and inhale daily. 30 capsule 12  . traZODone (DESYREL) 50 MG tablet TAKE 1 & 1/2 TABLETS BY MOUTH AT BEDTIMEAS NEEDED FOR SLEEP 135 tablet 1   No current facility-administered medications for this visit.    Facility-Administered Medications Ordered in Other Visits  Medication Dose Route Frequency Provider Last Rate Last Dose  . dexamethasone (DECADRON) injection 4 mg  4 mg Intravenous Once Rod Can, MD      . ondansetron (ZOFRAN) 4 mg in sodium chloride 0.9 % 50 mL IVPB  4 mg Intravenous Once Rod Can, MD      . ondansetron (ZOFRAN) 4 mg in sodium chloride 0.9 % 50 mL IVPB  4 mg Intravenous  Once Rod Can, MD         Allergies:   Patient has no known allergies.   Social History:  The patient  reports that she quit smoking about 18 years ago. Her smoking use included cigarettes. She has a 20.00 pack-year smoking history. She has never used smokeless tobacco. She reports that she does not drink alcohol or use drugs.   Family History:   family history includes Coronary artery disease in her father and mother; Diabetes in her father and mother; Heart disease in her father and mother; Kidney disease in her father; Lung disease in her mother.    Review of Systems: Review of Systems  Constitutional: Negative.   Respiratory: Positive for shortness of breath.   Cardiovascular: Negative.   Gastrointestinal: Negative.   Musculoskeletal: Negative.   Neurological: Negative.   Psychiatric/Behavioral: Negative.   All other systems reviewed and are negative.   PHYSICAL EXAM: VS:  BP 138/72 (BP Location: Left Arm, Patient Position: Sitting, Cuff Size: Normal)   Pulse 64   Ht 5' (1.524 m)   Wt 217 lb (98.4 kg)   BMI  42.38 kg/m  , BMI Body mass index is 42.38 kg/m. Constitutional:  oriented to person, place, and time. No distress.  Obese, presenting in a wheelchair HENT:  Head: Grossly normal Eyes:  no discharge. No scleral icterus.  Neck: No JVD, no carotid bruits  Cardiovascular: Regular rate and rhythm, no murmurs appreciated Pulmonary/Chest: Clear to auscultation bilaterally, no wheezes or rails Abdominal: Soft.  no distension.  no tenderness.  Musculoskeletal: Normal range of motion Neurological:  normal muscle tone. Coordination normal. No atrophy Skin: Skin warm and dry Psychiatric: normal affect, pleasant    Recent Labs: 04/13/2018: ALT 16; BUN 19; Creat 0.84; Potassium 4.4; Sodium 142    Lipid Panel Lab Results  Component Value Date   CHOL 183 04/13/2018   HDL 44 (L) 04/13/2018   LDLCALC 113 (H) 04/13/2018   TRIG 149 04/13/2018      Wt Readings  from Last 3 Encounters:  03/05/19 217 lb (98.4 kg)  04/13/18 214 lb 4 oz (97.2 kg)  04/13/18 214 lb 4 oz (97.2 kg)     ASSESSMENT AND PLAN:  HYPERCHOLESTEROLEMIA - Plan: EKG 12-Lead  restart crestor Statin must of fallen off her list, she was unaware  Aortic atherosclerosis ([place]) - Plan: EKG 12-Lead Aggressive cholesterol management recommended Restart crestor  Coronary artery disease involving native coronary artery of native heart with unstable angina pectoris ([place]) - Plan: EKG 12-Lead  cardiac catheterization with nonobstructive disease crestor as above  Obstructive sleep apnea - Plan: EKG 12-Lead Unable to exercise given her comorbidities and morbid obesity We have encouraged continued exercise, careful diet management in an effort to lose weight.  Centrilobular emphysema ([place]) - Plan: EKG 12-Lead On chronic oxygen Symptoms exacerbated by morbid obesity  ILD (interstitial lung disease) ([place]) - Plan: EKG 12-Lead Previous spray and exposure, occupational Followed by pulmonary She reports that she is not on oxygen regular, "sometimes"  Acute respiratory failure with hypoxia ([place]) - Plan: EKG 12-Lead  mildly elevated wedge pressure on right heart catheterization.  stable   Total encounter time more than 25 minutes  Greater than 50% was spent in counseling and coordination of care with the patient  Disposition:   F/U  12 months   Orders Placed This Encounter  Procedures  . EKG 12-Lead     Signed, Esmond Plants, M.D., Ph.D. 03/05/2019  [place], [place]

## 2019-03-05 ENCOUNTER — Ambulatory Visit (INDEPENDENT_AMBULATORY_CARE_PROVIDER_SITE_OTHER): Payer: Medicare HMO | Admitting: Cardiovascular Disease

## 2019-03-05 ENCOUNTER — Encounter: Payer: Self-pay | Admitting: Cardiovascular Disease

## 2019-03-05 ENCOUNTER — Other Ambulatory Visit: Payer: Self-pay

## 2019-03-05 VITALS — BP 138/72 | HR 64 | Ht 60.0 in | Wt 217.0 lb

## 2019-03-05 DIAGNOSIS — I700 Atherosclerosis of aorta: Secondary | ICD-10-CM

## 2019-03-05 DIAGNOSIS — I25118 Atherosclerotic heart disease of native coronary artery with other forms of angina pectoris: Secondary | ICD-10-CM

## 2019-03-05 DIAGNOSIS — J849 Interstitial pulmonary disease, unspecified: Secondary | ICD-10-CM

## 2019-03-05 DIAGNOSIS — I10 Essential (primary) hypertension: Secondary | ICD-10-CM | POA: Diagnosis not present

## 2019-03-05 DIAGNOSIS — E7800 Pure hypercholesterolemia, unspecified: Secondary | ICD-10-CM

## 2019-03-05 MED ORDER — FUROSEMIDE 20 MG PO TABS: 20.0000 mg | ORAL_TABLET | Freq: Every day | ORAL | 3 refills | Status: DC | PRN

## 2019-03-05 MED ORDER — ROSUVASTATIN CALCIUM 5 MG PO TABS: 5.0000 mg | ORAL_TABLET | Freq: Every day | ORAL | 3 refills | Status: AC

## 2019-03-05 MED ORDER — DILTIAZEM HCL ER COATED BEADS 180 MG PO CP24: 180.0000 mg | ORAL_CAPSULE | Freq: Every day | ORAL | 3 refills | Status: AC

## 2019-03-05 NOTE — Patient Instructions (Addendum)
Medication Instructions:  Restart crestor 5 mg daily Refills complete  If you need a refill on your cardiac medications before your next appointment, please call your pharmacy.    Lab work: No new labs needed   If you have labs (blood work) drawn today and your tests are completely normal, you will receive your results only by: Marland Kitchen MyChart Message (if you have MyChart) OR . A paper copy in the mail If you have any lab test that is abnormal or we need to change your treatment, we will call you to review the results.   Testing/Procedures: No new testing needed   Follow-Up: At [facility], you and your health needs are our priority.  As part of our continuing mission to provide you with exceptional heart care, we have created designated Provider Care Teams.  These Care Teams include your primary Cardiologist (physician) and Advanced Practice Providers (APPs -  Physician Assistants and Nurse Practitioners) who all work together to provide you with the care you need, when you need it.  . You will need a follow up appointment in 12 months .   Please call our office 2 months in advance to schedule this appointment.    . Providers on your designated Care Team:   . Murray Hodgkins, NP . Christell Faith, PA-C . Marrianne Mood, PA-C  Any Other Special Instructions Will Be Listed Below (If Applicable).  For educational health videos Log in to : [email or web address] Or : [email or web address], password : [email or web address]

## 2019-04-01 DIAGNOSIS — J849 Interstitial pulmonary disease, unspecified: Secondary | ICD-10-CM | POA: Diagnosis not present

## 2019-04-01 DIAGNOSIS — R062 Wheezing: Secondary | ICD-10-CM | POA: Diagnosis not present

## 2019-04-01 DIAGNOSIS — J432 Centrilobular emphysema: Secondary | ICD-10-CM | POA: Diagnosis not present

## 2019-04-01 DIAGNOSIS — J449 Chronic obstructive pulmonary disease, unspecified: Secondary | ICD-10-CM | POA: Diagnosis not present

## 2019-04-10 DIAGNOSIS — H1033 Unspecified acute conjunctivitis, bilateral: Secondary | ICD-10-CM | POA: Diagnosis not present

## 2019-04-12 DIAGNOSIS — I4891 Unspecified atrial fibrillation: Secondary | ICD-10-CM | POA: Diagnosis not present

## 2019-04-12 DIAGNOSIS — I10 Essential (primary) hypertension: Secondary | ICD-10-CM | POA: Diagnosis not present

## 2019-04-12 DIAGNOSIS — M1990 Unspecified osteoarthritis, unspecified site: Secondary | ICD-10-CM | POA: Diagnosis not present

## 2019-04-12 DIAGNOSIS — R69 Illness, unspecified: Secondary | ICD-10-CM | POA: Diagnosis not present

## 2019-04-12 DIAGNOSIS — G3184 Mild cognitive impairment, so stated: Secondary | ICD-10-CM | POA: Diagnosis not present

## 2019-04-12 DIAGNOSIS — D6869 Other thrombophilia: Secondary | ICD-10-CM | POA: Diagnosis not present

## 2019-04-12 DIAGNOSIS — J449 Chronic obstructive pulmonary disease, unspecified: Secondary | ICD-10-CM | POA: Diagnosis not present

## 2019-04-12 DIAGNOSIS — J9610 Chronic respiratory failure, unspecified whether with hypoxia or hypercapnia: Secondary | ICD-10-CM | POA: Diagnosis not present

## 2019-04-26 ENCOUNTER — Telehealth: Payer: Self-pay | Admitting: Family Medicine

## 2019-04-26 ENCOUNTER — Other Ambulatory Visit (INDEPENDENT_AMBULATORY_CARE_PROVIDER_SITE_OTHER): Payer: Medicare HMO

## 2019-04-26 ENCOUNTER — Ambulatory Visit: Payer: Medicare HMO

## 2019-04-26 ENCOUNTER — Ambulatory Visit (INDEPENDENT_AMBULATORY_CARE_PROVIDER_SITE_OTHER): Payer: Medicare HMO

## 2019-04-26 ENCOUNTER — Encounter: Payer: Medicare HMO | Admitting: Family Medicine

## 2019-04-26 ENCOUNTER — Other Ambulatory Visit: Payer: Self-pay

## 2019-04-26 DIAGNOSIS — E7800 Pure hypercholesterolemia, unspecified: Secondary | ICD-10-CM | POA: Diagnosis not present

## 2019-04-26 DIAGNOSIS — Z0000 Encounter for general adult medical examination without abnormal findings: Secondary | ICD-10-CM

## 2019-04-26 NOTE — Progress Notes (Signed)
PCP notes:  Health Maintenance: Needs flu vaccine Declined mammogram Will check with insurance about shingrix  Abnormal Screenings:  PHQ9 score 17   Patient concerns: Patient is concerned about memory loss and her urination issues.    Nurse concerns: none   Next PCP appt.: 05/03/2019 @ 4 pm

## 2019-04-26 NOTE — Progress Notes (Signed)
Subjective:   Susan [name] is a 78 y.o. female who presents for Medicare Annual (Subsequent) preventive examination.  Review of Systems: N/A   This visit is being conducted through telemedicine via telephone at the nurse health advisor's home address due to the COVID-19 pandemic. This patient has given me verbal consent via doximity to conduct this visit, patient states they are participating from their home address. Patient and myself are on the telephone call. There is no referral for this visit. Some vital signs may be absent or patient reported.    Patient identification: identified by name, DOB, and current address   Cardiac Risk Factors include: advanced age (>17men, >22 women);dyslipidemia     Objective:     Vitals: There were no vitals taken for this visit.  There is no height or weight on file to calculate BMI.  Advanced Directives 04/26/2019 04/13/2018 09/28/2017 07/06/2017 07/06/2017 03/27/2017 09/19/2016  Does Patient Have a Medical Advance Directive? Yes Yes Yes Yes Yes No No  Type of Paramedic of [place];Living will [place];Living will [place];Living will [place];Living will - - -  Does patient want to make changes to medical advance directive? - - No - Patient declined No - Patient declined - - -  Copy of [place] in Chart? No - copy requested No - copy requested No - copy requested No - copy requested - - -    Tobacco Social History   Tobacco Use  Smoking Status Former Smoker  . Packs/day: 0.50  . Years: 40.00  . Pack years: 20.00  . Types: Cigarettes  . Quit date: 08/29/2000  . Years since quitting: 18.6  Smokeless Tobacco Never Used     Counseling given: Not Answered   Clinical Intake:  Pre-visit preparation completed: Yes  Pain : No/denies pain     Nutritional Risks: None Diabetes: No  How often do you need to have someone help you when you  read instructions, pamphlets, or other written materials from your doctor or pharmacy?: 1 - Never What is the last grade level you completed in school?: 12th  Interpreter Needed?: No  Information entered by :: CJohnson, LPN  Past Medical History:  Diagnosis Date  . Acute peptic ulcer, unspecified site, with hemorrhage and perforation, without mention of obstruction   . Arthritis   . Asthma   . Balance problems    unsteady on feet at time  . Cerebral amyloid angiopathy ([place])    a. 06/2017 admit w/ aphasia and blurred vision.  RI w/ extensive chronic microhemorrhages c/w amyloid angiopathy-->ASA initiated.  . Chronic airway obstruction, not elsewhere classified    on inhalers  . Diastolic dysfunction    a. 06/2017 Echo: EF 50-55%, no rwma, Gr1 DD, nl LA size, nl RV fxn.  . Diverticulosis of colon (without mention of hemorrhage)   . Hematochezia   . 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 Morbid obesity ([place])   . Non-obstructive CAD (coronary artery disease)    a. 12/2012 MV: EF 71%, no ischemia; b. 06/2016 Cath: LM nl, LAD 50p, LCX nl, RCA 22m, EF 55-65%.  . OSA (obstructive sleep apnea)   . Osteoarthritis    a. s/p bilat hip replacements.  Marland Kitchen UTI (urinary tract infection)    a. 09/2017 admit for E coli & Klebsiella UTI.  . Ventral hernia    Past Surgical History:  Procedure Laterality Date  . APPENDECTOMY    .  BUNIONECTOMY Bilateral   . COSMETIC SURGERY  1970's   abdomen. Tummy Tuck  . FOOT SURGERY Bilateral dec 2015 right    left 3 yrs ago  . RIGHT/LEFT HEART CATH AND CORONARY ANGIOGRAPHY Bilateral 07/08/2016   Procedure: Right/Left Heart Cath and Coronary Angiography;  Surgeon: Minna Merritts, MD;  Location: [facility];  Service: Cardiovascular;  Laterality: Bilateral;  . TOTAL HIP ARTHROPLASTY  2007   left  . TOTAL HIP ARTHROPLASTY     bilateral   . TOTAL KNEE ARTHROPLASTY Right 08/08/2014   Procedure: RIGHT TOTAL KNEE ARTHROPLASTY WITH NAVIGATION;   Surgeon: Rod Can, MD;  Location: [facility];  Service: Orthopedics;  Laterality: Right;  . TOTAL KNEE ARTHROPLASTY Left 10/03/2014   Procedure: LEFT TOTAL KNEE ARTHROPLASTY;  Surgeon: Rod Can, MD;  Location: [facility];  Service: Orthopedics;  Laterality: Left;  . TUBAL LIGATION    . UMBILICAL HERNIA REPAIR     2016   Family History  Problem Relation Age of Onset  . Diabetes Mother   . Coronary artery disease Mother   . Lung disease Mother   . Heart disease Mother        CAD  . Coronary artery disease Father   . Kidney disease Father   . Diabetes Father   . Heart disease Father        CAD  . Cancer Neg Hx    Social History   Socioeconomic History  . Marital status: Divorced    Spouse name: Not on file  . Number of children: 3  . Years of education: Not on file  . Highest education level: Not on file  Occupational History  . Occupation: self employeed    Comment: Owns [employer]  Social Needs  . Financial resource strain: Not hard at all  . Food insecurity    Worry: Never true    Inability: Never true  . Transportation needs    Medical: No    Non-medical: No  Tobacco Use  . Smoking status: Former Smoker    Packs/day: 0.50    Years: 40.00    Pack years: 20.00    Types: Cigarettes    Quit date: 08/29/2000    Years since quitting: 18.6  . Smokeless tobacco: Never Used  Substance and Sexual Activity  . Alcohol use: No    Alcohol/week: 0.0 standard drinks    Comment: rare  . Drug use: No  . Sexual activity: Not Currently  Lifestyle  . Physical activity    Days per week: 0 days    Minutes per session: 0 min  . Stress: Not at all  Relationships  . Social Herbalist on phone: Not on file    Gets together: Not on file    Attends religious service: Not on file    Active member of club or organization: Not on file    Attends meetings of clubs or organizations: Not on file    Relationship status: Not on file  Other Topics Concern  . Not on file   Social History Narrative   Diet: Moderate, low fat, skips meals, some fruit and veggies   Regular exercise:  No   HCPOA is [name] and [name],  Has living will, Not sure of code status (reviewed 2013)    Outpatient Encounter Medications as of 04/26/2019  Medication Sig  . albuterol (PROVENTIL HFA;VENTOLIN HFA) 108 (90 Base) MCG/ACT inhaler Inhale 2 puffs into the lungs every 6 (six)  hours as needed for wheezing or shortness of breath.  . budesonide-formoterol (SYMBICORT) 160-4.5 MCG/ACT inhaler TAKE 2 PUFFS INTO LUNGS TWICE A DAY  . diltiazem (CARDIZEM CD) 180 MG 24 hr capsule Take 1 capsule (180 mg total) by mouth daily.  . furosemide (LASIX) 20 MG tablet Take 1 tablet (20 mg total) by mouth daily as needed.  Marland Kitchen ibuprofen (ADVIL,MOTRIN) 200 MG tablet Take 600 mg by mouth every 6 (six) hours as needed for headache or moderate pain.   Marland Kitchen ipratropium-albuterol (DUONEB) 0.5-2.5 (3) MG/3ML SOLN Take 3 mLs by nebulization 3 (three) times daily.  . Melatonin 10 MG TABS Take 15 mg by mouth at bedtime.  . OXYGEN Inhale 2 L into the lungs daily.  Marland Kitchen rOPINIRole (REQUIP) 3 MG tablet Take 1 tablet (3 mg total) by mouth at bedtime.  . rosuvastatin (CRESTOR) 5 MG tablet Take 1 tablet (5 mg total) by mouth daily.  Marland Kitchen tiotropium (SPIRIVA) 18 MCG inhalation capsule Place 1 capsule (18 mcg total) into inhaler and inhale daily.  . traZODone (DESYREL) 50 MG tablet TAKE 1 & 1/2 TABLETS BY MOUTH AT [employer] NEEDED FOR SLEEP   Facility-Administered Encounter Medications as of 04/26/2019  Medication  . dexamethasone (DECADRON) injection 4 mg  . ondansetron (ZOFRAN) 4 mg in sodium chloride 0.9 % 50 mL IVPB  . ondansetron (ZOFRAN) 4 mg in sodium chloride 0.9 % 50 mL IVPB    Activities of Daily Living In your present state of health, do you have any difficulty performing the following activities: 04/26/2019  Hearing? N  Vision? N  Difficulty concentrating or making decisions? Y  Comment some memory loss  noted  Walking or climbing stairs? N  Dressing or bathing? N  Doing errands, shopping? N  Preparing Food and eating ? N  Using the Toilet? N  In the past six months, have you accidently leaked urine? Y  Comment wears depends  Do you have problems with loss of bowel control? Y  Comment wears depends  Managing your Medications? N  Managing your Finances? N  Housekeeping or managing your Housekeeping? N  Some recent data might be hidden    Patient Care Team: [name], MD as PCP - General Rockey Situ Kathlene November, MD as PCP - Cardiology (Cardiology)    Assessment:   This is a routine wellness examination for Olena.  Exercise Activities and Dietary recommendations Current Exercise Habits: The patient does not participate in regular exercise at present, Exercise limited by: None identified  Goals    . Patient Stated     Starting 04/13/2018, I will continue to take medications as prescribed.     . Patient Stated     04/26/2019, I will try to increase my activity and start riding my exercise bike.        Fall Risk Fall Risk  04/26/2019 04/13/2018 10/27/2017 01/12/2016 01/22/2013  Falls in the past year? 0 0 No No No  Number falls in past yr: 0 - - - -  Injury with Fall? 0 - - - -  Risk for fall due to : Medication side effect;Impaired balance/gait - - - -  Follow up Falls evaluation completed;Falls prevention discussed - - - -   Is the patient's home free of loose throw rugs in walkways, pet beds, electrical cords, etc?   yes      Grab bars in the bathroom? yes      Handrails on the stairs?   yes      Adequate  lighting?   yes  Timed Get Up and Go performed: N/A  Depression Screen PHQ 2/9 Scores 04/26/2019 04/13/2018 10/27/2017 03/28/2017  PHQ - 2 Score 0 0 0 0  PHQ- 9 Score 0 0 - 4     Cognitive Function MMSE - Mini Mental State Exam 04/26/2019 04/13/2018  Orientation to time 4 5  Orientation to Place 5 5  Registration 3 3  Attention/ Calculation 2 0  Recall 3 3   Language- name 2 objects - 0  Language- repeat 0 1  Language- follow 3 step command - 2  Language- follow 3 step command-comments - unable to follow 1 step to 3 step command, despite several cues  Language- read & follow direction - 0  Write a sentence - 0  Copy design - 0  Total score - 19  Mini Cog  Mini-Cog screen was completed. Maximum score is 22. A value of 0 denotes this part of the MMSE was not completed or the patient failed this part of the Mini-Cog screening.       Immunization History  Administered Date(s) Administered  . Influenza Split 03/12/2012  . Influenza Whole 03/25/2009  . Influenza, High Dose Seasonal PF 03/07/2017, 04/13/2018  . Influenza,inj,Quad PF,6+ Mos 01/22/2013, 03/03/2014, 02/10/2015, 01/12/2016  . Pneumococcal Conjugate-13 03/03/2014  . Pneumococcal Polysaccharide-23 06/18/2008  . Td 06/18/2008  . Zoster 05/23/2005    Qualifies for Shingles Vaccine? Yes  Screening Tests Health Maintenance  Topic Date Due  . TETANUS/TDAP  06/18/2018  . INFLUENZA VACCINE  12/22/2018  . MAMMOGRAM  05/23/2019 (Originally 01/01/2015)  . DEXA SCAN  Completed  . PNA vac Low Risk Adult  Completed    Cancer Screenings: Lung: Low Dose CT Chest recommended if Age 51-80 years, 30 pack-year currently smoking OR have quit w/in 15years. Patient does not qualify. Breast:  Up to date on Mammogram? No, declined   Up to date of Bone Density/Dexa? Completed 03/14/2012 Colorectal: no longer required  Additional Screenings:  Hepatitis C Screening: N/A     Plan:    Patient will try to increase activity and ride her exercise bike.    I have personally reviewed and noted the following in the patient's chart:   . Medical and social history . Use of alcohol, tobacco or illicit drugs  . Current medications and supplements . Functional ability and status . Nutritional status . Physical activity . Advanced directives . List of other physicians . Hospitalizations,  surgeries, and ER visits in previous 12 months . Vitals . Screenings to include cognitive, depression, and falls . Referrals and appointments  In addition, I have reviewed and discussed with patient certain preventive protocols, quality metrics, and best practice recommendations. A written personalized care plan for preventive services as well as general preventive health recommendations were provided to patient.     Andrez Grime, LPN  [id number]

## 2019-04-26 NOTE — Addendum Note (Signed)
Addended by: Ellamae Sia on: 04/26/2019 03:31 PM   Modules accepted: Orders

## 2019-04-26 NOTE — Telephone Encounter (Signed)
-----   Message from Ellamae Sia sent at 04/10/2019  3:31 PM EST ----- Regarding: Lab orders for Friday, 12.4.20 Patient is scheduled for CPX labs, please order future labs, Thanks , Karna Christmas

## 2019-04-26 NOTE — Patient Instructions (Signed)
Susan [name] , Thank you for taking time to come for your Medicare Wellness Visit. I appreciate your ongoing commitment to your health goals. Please review the following plan we discussed and let me know if I can assist you in the future.   Screening recommendations/referrals: Colonoscopy: no longer required  Mammogram: declined at this time Bone Density: completed 03/14/2012 Recommended yearly ophthalmology/optometry visit for glaucoma screening and checkup Recommended yearly dental visit for hygiene and checkup  Vaccinations: Influenza vaccine: will get at physical Pneumococcal vaccine: Completed series Tdap vaccine: decline Shingles vaccine: will check with insurance    Advanced directives: Please bring a copy of your POA (Power of Attorney) and/or Living Will to your next appointment.   Conditions/risks identified: hyperlipidemia  Next appointment: 05/03/2019 @ 4 pm   Preventive Care 65 Years and Older, Female Preventive care refers to lifestyle choices and visits with your health care provider that can promote health and wellness. What does preventive care include?  A yearly physical exam. This is also called an annual well check.  Dental exams once or twice a year.  Routine eye exams. Ask your health care provider how often you should have your eyes checked.  Personal lifestyle choices, including:  Daily care of your teeth and gums.  Regular physical activity.  Eating a healthy diet.  Avoiding tobacco and drug use.  Limiting alcohol use.  Practicing safe sex.  Taking low-dose aspirin every day.  Taking vitamin and mineral supplements as recommended by your health care provider. What happens during an annual well check? The services and screenings done by your health care provider during your annual well check will depend on your age, overall health, lifestyle risk factors, and family history of disease. Counseling  Your health care provider may ask you  questions about your:  Alcohol use.  Tobacco use.  Drug use.  Emotional well-being.  Home and relationship well-being.  Sexual activity.  Eating habits.  History of falls.  Memory and ability to understand (cognition).  Work and work Statistician.  Reproductive health. Screening  You may have the following tests or measurements:  Height, weight, and BMI.  Blood pressure.  Lipid and cholesterol levels. These may be checked every 5 years, or more frequently if you are over 28 years old.  Skin check.  Lung cancer screening. You may have this screening every year starting at age 47 if you have a 30-pack-year history of smoking and currently smoke or have quit within the past 15 years.  Fecal occult blood test (FOBT) of the stool. You may have this test every year starting at age 28.  Flexible sigmoidoscopy or colonoscopy. You may have a sigmoidoscopy every 5 years or a colonoscopy every 10 years starting at age 53.  Hepatitis C blood test.  Hepatitis B blood test.  Sexually transmitted disease (STD) testing.  Diabetes screening. This is done by checking your blood sugar (glucose) after you have not eaten for a while (fasting). You may have this done every 1-3 years.  Bone density scan. This is done to screen for osteoporosis. You may have this done starting at age 40.  Mammogram. This may be done every 1-2 years. Talk to your health care provider about how often you should have regular mammograms. Talk with your health care provider about your test results, treatment options, and if necessary, the need for more tests. Vaccines  Your health care provider may recommend certain vaccines, such as:  Influenza vaccine. This is recommended every year.  Tetanus, diphtheria, and acellular pertussis (Tdap, Td) vaccine. You may need a Td booster every 10 years.  Zoster vaccine. You may need this after age 31.  Pneumococcal 13-valent conjugate (PCV13) vaccine. One dose is  recommended after age 15.  Pneumococcal polysaccharide (PPSV23) vaccine. One dose is recommended after age 79. Talk to your health care provider about which screenings and vaccines you need and how often you need them. This information is not intended to replace advice given to you by your health care provider. Make sure you discuss any questions you have with your health care provider. Document Released: 06/05/2015 Document Revised: 01/27/2016 Document Reviewed: 03/10/2015 Elsevier Interactive Patient Education  2017 [place] Prevention in the Home Falls can cause injuries. They can happen to people of all ages. There are many things you can do to make your home safe and to help prevent falls. What can I do on the outside of my home?  Regularly fix the edges of walkways and driveways and fix any cracks.  Remove anything that might make you trip as you walk through a door, such as a raised step or threshold.  Trim any bushes or trees on the path to your home.  Use bright outdoor lighting.  Clear any walking paths of anything that might make someone trip, such as rocks or tools.  Regularly check to see if handrails are loose or broken. Make sure that both sides of any steps have handrails.  Any raised decks and porches should have guardrails on the edges.  Have any leaves, snow, or ice cleared regularly.  Use sand or salt on walking paths during winter.  Clean up any spills in your garage right away. This includes oil or grease spills. What can I do in the bathroom?  Use night lights.  Install grab bars by the toilet and in the tub and shower. Do not use towel bars as grab bars.  Use non-skid mats or decals in the tub or shower.  If you need to sit down in the shower, use a plastic, non-slip stool.  Keep the floor dry. Clean up any water that spills on the floor as soon as it happens.  Remove soap buildup in the tub or shower regularly.  Attach bath mats  securely with double-sided non-slip rug tape.  Do not have throw rugs and other things on the floor that can make you trip. What can I do in the bedroom?  Use night lights.  Make sure that you have a light by your bed that is easy to reach.  Do not use any sheets or blankets that are too big for your bed. They should not hang down onto the floor.  Have a firm chair that has side arms. You can use this for support while you get dressed.  Do not have throw rugs and other things on the floor that can make you trip. What can I do in the kitchen?  Clean up any spills right away.  Avoid walking on wet floors.  Keep items that you use a lot in easy-to-reach places.  If you need to reach something above you, use a strong step stool that has a grab bar.  Keep electrical cords out of the way.  Do not use floor polish or wax that makes floors slippery. If you must use wax, use non-skid floor wax.  Do not have throw rugs and other things on the floor that can make you trip. What can I do  with my stairs?  Do not leave any items on the stairs.  Make sure that there are handrails on both sides of the stairs and use them. Fix handrails that are broken or loose. Make sure that handrails are as long as the stairways.  Check any carpeting to make sure that it is firmly attached to the stairs. Fix any carpet that is loose or worn.  Avoid having throw rugs at the top or bottom of the stairs. If you do have throw rugs, attach them to the floor with carpet tape.  Make sure that you have a light switch at the top of the stairs and the bottom of the stairs. If you do not have them, ask someone to add them for you. What else can I do to help prevent falls?  Wear shoes that:  Do not have high heels.  Have rubber bottoms.  Are comfortable and fit you well.  Are closed at the toe. Do not wear sandals.  If you use a stepladder:  Make sure that it is fully opened. Do not climb a closed  stepladder.  Make sure that both sides of the stepladder are locked into place.  Ask someone to hold it for you, if possible.  Clearly mark and make sure that you can see:  Any grab bars or handrails.  First and last steps.  Where the edge of each step is.  Use tools that help you move around (mobility aids) if they are needed. These include:  Canes.  Walkers.  Scooters.  Crutches.  Turn on the lights when you go into a dark area. Replace any light bulbs as soon as they burn out.  Set up your furniture so you have a clear path. Avoid moving your furniture around.  If any of your floors are uneven, fix them.  If there are any pets around you, be aware of where they are.  Review your medicines with your doctor. Some medicines can make you feel dizzy. This can increase your chance of falling. Ask your doctor what other things that you can do to help prevent falls. This information is not intended to replace advice given to you by your health care provider. Make sure you discuss any questions you have with your health care provider. Document Released: 03/05/2009 Document Revised: 10/15/2015 Document Reviewed: 06/13/2014 Elsevier Interactive Patient Education  2017 [employer].

## 2019-04-27 LAB — COMPREHENSIVE METABOLIC PANEL
AG Ratio: 1.4 (calc) (ref 1.0–2.5)
ALT: 11 U/L (ref 6–29)
AST: 14 U/L (ref 10–35)
Albumin: 3.9 g/dL (ref 3.6–5.1)
Alkaline phosphatase (APISO): 70 U/L (ref 37–153)
BUN: 11 mg/dL (ref 7–25)
CO2: 23 mmol/L (ref 20–32)
Calcium: 9.1 mg/dL (ref 8.6–10.4)
Chloride: 105 mmol/L (ref 98–110)
Creat: 0.74 mg/dL (ref 0.60–0.93)
Globulin: 2.7 g/dL (calc) (ref 1.9–3.7)
Glucose, Bld: 101 mg/dL — ABNORMAL HIGH (ref 65–99)
Potassium: 4 mmol/L (ref 3.5–5.3)
Sodium: 140 mmol/L (ref 135–146)
Total Bilirubin: 0.8 mg/dL (ref 0.2–1.2)
Total Protein: 6.6 g/dL (ref 6.1–8.1)

## 2019-04-27 LAB — LIPID PANEL
Cholesterol: 129 mg/dL (ref <200)
HDL: 41 mg/dL — ABNORMAL LOW (ref >=50)
LDL Cholesterol (Calc): 67 mg/dL (calc)
Non-HDL Cholesterol (Calc): 88 mg/dL (calc) (ref <130)
Total CHOL/HDL Ratio: 3.1 (calc) (ref <5.0)
Triglycerides: 120 mg/dL (ref <150)

## 2019-04-30 NOTE — Progress Notes (Signed)
No critical labs need to be addressed urgently. We will discuss labs in detail at upcoming office visit.   

## 2019-05-01 DIAGNOSIS — J432 Centrilobular emphysema: Secondary | ICD-10-CM | POA: Diagnosis not present

## 2019-05-01 DIAGNOSIS — R062 Wheezing: Secondary | ICD-10-CM | POA: Diagnosis not present

## 2019-05-01 DIAGNOSIS — J849 Interstitial pulmonary disease, unspecified: Secondary | ICD-10-CM | POA: Diagnosis not present

## 2019-05-01 DIAGNOSIS — J449 Chronic obstructive pulmonary disease, unspecified: Secondary | ICD-10-CM | POA: Diagnosis not present

## 2019-05-03 ENCOUNTER — Other Ambulatory Visit: Payer: Self-pay

## 2019-05-03 ENCOUNTER — Ambulatory Visit (INDEPENDENT_AMBULATORY_CARE_PROVIDER_SITE_OTHER): Payer: Medicare HMO | Admitting: Family Medicine

## 2019-05-03 ENCOUNTER — Encounter: Payer: Self-pay | Admitting: Family Medicine

## 2019-05-03 VITALS — BP 110/80 | HR 66 | Temp 97.3°F | Ht 60.0 in | Wt 213.8 lb

## 2019-05-03 DIAGNOSIS — Z0000 Encounter for general adult medical examination without abnormal findings: Secondary | ICD-10-CM | POA: Diagnosis not present

## 2019-05-03 DIAGNOSIS — F411 Generalized anxiety disorder: Secondary | ICD-10-CM

## 2019-05-03 DIAGNOSIS — E782 Mixed hyperlipidemia: Secondary | ICD-10-CM

## 2019-05-03 DIAGNOSIS — J849 Interstitial pulmonary disease, unspecified: Secondary | ICD-10-CM

## 2019-05-03 DIAGNOSIS — R569 Unspecified convulsions: Secondary | ICD-10-CM | POA: Diagnosis not present

## 2019-05-03 DIAGNOSIS — R69 Illness, unspecified: Secondary | ICD-10-CM | POA: Diagnosis not present

## 2019-05-03 DIAGNOSIS — H2513 Age-related nuclear cataract, bilateral: Secondary | ICD-10-CM | POA: Diagnosis not present

## 2019-05-03 DIAGNOSIS — G2581 Restless legs syndrome: Secondary | ICD-10-CM | POA: Diagnosis not present

## 2019-05-03 DIAGNOSIS — Z23 Encounter for immunization: Secondary | ICD-10-CM

## 2019-05-03 DIAGNOSIS — F5104 Psychophysiologic insomnia: Secondary | ICD-10-CM

## 2019-05-03 DIAGNOSIS — I700 Atherosclerosis of aorta: Secondary | ICD-10-CM | POA: Diagnosis not present

## 2019-05-03 DIAGNOSIS — G309 Alzheimer's disease, unspecified: Secondary | ICD-10-CM | POA: Diagnosis not present

## 2019-05-03 DIAGNOSIS — F0150 Vascular dementia without behavioral disturbance: Secondary | ICD-10-CM

## 2019-05-03 DIAGNOSIS — F0280 Dementia in other diseases classified elsewhere without behavioral disturbance: Secondary | ICD-10-CM

## 2019-05-03 NOTE — Patient Instructions (Signed)
Call  [facility] to transfer care to [facility].

## 2019-05-03 NOTE — Progress Notes (Signed)
Chief Complaint  Patient presents with  . Annual Exam    Part 2    History of Present Illness: HPI  The patient presents for complete physical and review of chronic health problems. He/She also has the following acute concerns today:none  The patient saw a LPN or RN for medicare wellness visit.  Prevention and wellness was reviewed in detail. Note reviewed and important notes copied below.  Health Maintenance: Needs flu vaccine Declined mammogram Will check with insurance about shingrix Abnormal Screenings: PHQ9 score 17 Patient concerns: Patient is concerned about memory loss and her urination issues... wears depends  Patient Care Team: [name], MD as PCP - General Minna Merritts, MD as PCP - Cardiology (Cardiology)  05/03/19 chronic insomnia:  Using trazodone for sleep.. Working  Well.   RLS: well controlled on requip.  Elevated Cholesterol: Lab Results  Component Value Date   CHOL 129 04/26/2019   HDL 41 (L) 04/26/2019   LDLCALC 67 04/26/2019   LDLDIRECT 107.0 01/11/2016   TRIG 120 04/26/2019   CHOLHDL 3.1 04/26/2019  Using medications without problems: Muscle aches:  Diet compliance: moderate. Exercise:none  Hypertension:   good control on Cardizem.. Using medication without problems or lightheadedness: none Chest pain with exertion: none Edema:none Short of breath: stable, but not great Average home BPs: Other issues: Other complaints:  Cholesterol at goal on crestor. GOal LDL < 104  HX of CAD and CVA:  On ASpirin.  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 Lake Bells. Limits mobility a lot.  ? Seizure do: no  clear epissodes ... Taken off keppra given SE.  Dementia:, stable followed by neurology, Dr. Sunday Corn. 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 GAD: well controlled per pt today on zoloft  This visit occurred during the SARS-CoV-2 public health emergency.  Safety protocols were in place,  including screening questions prior to the visit, additional usage of staff PPE, and extensive cleaning of exam room while observing appropriate contact time as indicated for disinfecting solutions.   COVID 19 screen:  No recent travel or known exposure to COVID19 The patient denies respiratory symptoms of COVID 19 at this time. The importance of social distancing was discussed today.     Review of Systems  Constitutional: Negative for chills and fever.  HENT: Negative for congestion and ear pain.   Eyes: Negative for pain and redness.  Respiratory: Positive for shortness of breath. Negative for cough.   Cardiovascular: Negative for chest pain, palpitations and leg swelling.  Gastrointestinal: Negative for abdominal pain, blood in stool, constipation, diarrhea, nausea and vomiting.  Genitourinary: Negative for dysuria.  Musculoskeletal: Negative for falls and myalgias.  Skin: Negative for rash.  Neurological: Negative for dizziness.  Psychiatric/Behavioral: Negative for depression. The patient is not nervous/anxious.       Past Medical History:  Diagnosis Date  . Acute peptic ulcer, unspecified site, with hemorrhage and perforation, without mention of obstruction   . Arthritis   . Asthma   . Balance problems    unsteady on feet at time  . 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 06/2017 admit w/ aphasia and blurred vision.  RI w/ extensive chronic microhemorrhages c/w amyloid angiopathy-->ASA initiated.  . Chronic airway obstruction, not elsewhere classified    on inhalers  . Diastolic dysfunction    a. 06/2017 Echo: EF 50-55%, no rwma, Gr1 DD, nl LA size, nl RV fxn.  . Diverticulosis of colon (without mention of hemorrhage)   .  Hematochezia   . 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 Morbid obesity ([facility] [place])   . Non-obstructive CAD (coronary artery disease)    a. 12/2012 MV: EF 71%, no ischemia; b. 06/2016 Cath: LM nl, LAD 50p, LCX nl, RCA 95m, EF 55-65%.  . OSA  (obstructive sleep apnea)   . Osteoarthritis    a. s/p bilat hip replacements.  Marland Kitchen UTI (urinary tract infection)    a. 09/2017 admit for E coli & Klebsiella UTI.  . Ventral hernia     reports that she quit smoking about 18 years ago. Her smoking use included cigarettes. She has a 20.00 pack-year smoking history. She has never used smokeless tobacco. She reports that she does not drink alcohol or use drugs.   Current Outpatient Medications:  .  albuterol (PROVENTIL HFA;VENTOLIN HFA) 108 (90 Base) MCG/ACT inhaler, Inhale 2 puffs into the lungs every 6 (six) hours as needed for wheezing or shortness of breath., Disp: 1 Inhaler, Rfl: 2 .  budesonide-formoterol (SYMBICORT) 160-4.5 MCG/ACT inhaler, TAKE 2 PUFFS INTO LUNGS TWICE A DAY, Disp: 1 Inhaler, Rfl: 11 .  diltiazem (CARDIZEM CD) 180 MG 24 hr capsule, Take 1 capsule (180 mg total) by mouth daily., Disp: 90 capsule, Rfl: 3 .  furosemide (LASIX) 20 MG tablet, Take 1 tablet (20 mg total) by mouth daily as needed., Disp: 30 tablet, Rfl: 3 .  ibuprofen (ADVIL,MOTRIN) 200 MG tablet, Take 600 mg by mouth every 6 (six) hours as needed for headache or moderate pain. , Disp: , Rfl:  .  ipratropium-albuterol (DUONEB) 0.5-2.5 (3) MG/3ML SOLN, Take 3 mLs by nebulization 3 (three) times daily., Disp: 360 mL, Rfl: 5 .  Melatonin 10 MG TABS, Take 15 mg by mouth at bedtime., Disp: , Rfl:  .  OXYGEN, Inhale 2 L into the lungs daily., Disp: , Rfl:  .  rOPINIRole (REQUIP) 3 MG tablet, Take 1 tablet (3 mg total) by mouth at bedtime., Disp: 90 tablet, Rfl: 1 .  rosuvastatin (CRESTOR) 5 MG tablet, Take 1 tablet (5 mg total) by mouth daily., Disp: 90 tablet, Rfl: 3 .  tiotropium (SPIRIVA) 18 MCG inhalation capsule, Place 1 capsule (18 mcg total) into inhaler and inhale daily., Disp: 30 capsule, Rfl: 12 .  traZODone (DESYREL) 50 MG tablet, TAKE 1 & 1/2 TABLETS BY MOUTH AT BEDTIMEAS NEEDED FOR SLEEP, Disp: 135 tablet, Rfl: 1 .  ZOLOFT 25 MG tablet, Take 1 tablet by mouth  daily., Disp: , Rfl:  No current facility-administered medications for this visit.  Facility-Administered Medications Ordered in Other Visits:  .  dexamethasone (DECADRON) injection 4 mg, 4 mg, Intravenous, Once, Swinteck, Aaron Edelman, MD .  ondansetron (ZOFRAN) 4 mg in sodium chloride 0.9 % 50 mL IVPB, 4 mg, Intravenous, Once, Swinteck, Aaron Edelman, MD .  ondansetron (ZOFRAN) 4 mg in sodium chloride 0.9 % 50 mL IVPB, 4 mg, Intravenous, Once, Swinteck, Aaron Edelman, MD   Observations/Objective: Blood pressure 110/80, pulse 66, temperature (!) 97.3 F (36.3 C), temperature source Temporal, height 5' (1.524 m), weight 213 lb 12 oz (97 kg), SpO2 94 %.  Physical Exam Constitutional:      General: She is not in acute distress.    Appearance: Normal appearance. She is well-developed. She is not ill-appearing or toxic-appearing.     Comments: Well dressed and cared for, very positive outlook   in wheelchair, presents with daughter.  HENT:     Head: Normocephalic.     Right Ear: Hearing, tympanic membrane, ear canal and external  ear normal. Tympanic membrane is not erythematous, retracted or bulging.     Left Ear: Hearing, tympanic membrane, ear canal and external ear normal. Tympanic membrane is not erythematous, retracted or bulging.     Nose: Nose normal. No mucosal edema or rhinorrhea.     Right Sinus: No maxillary sinus tenderness or frontal sinus tenderness.     Left Sinus: No maxillary sinus tenderness or frontal sinus tenderness.     Mouth/Throat:     Pharynx: Uvula midline.  Eyes:     General: Lids are normal. Lids are everted, no foreign bodies appreciated.     Conjunctiva/sclera: Conjunctivae normal.     Pupils: Pupils are equal, round, and reactive to light.  Neck:     Thyroid: No thyroid mass or thyromegaly.     Vascular: No carotid bruit.     Trachea: Trachea normal.  Cardiovascular:     Rate and Rhythm: Normal rate and regular rhythm.     Pulses: Normal pulses.     Heart sounds: Normal  heart sounds, S1 normal and S2 normal. No murmur. No friction rub. No gallop.   Pulmonary:     Effort: Pulmonary effort is normal. No tachypnea or respiratory distress.     Breath sounds: Normal breath sounds. No wheezing, rhonchi or rales.  Abdominal:     General: Bowel sounds are normal. There is no distension or abdominal bruit.     Palpations: Abdomen is soft. There is no fluid wave or mass.     Tenderness: There is no abdominal tenderness. There is no guarding or rebound.     Hernia: No hernia is present.  Musculoskeletal:     Cervical back: Normal range of motion and neck supple.     Right foot: Decreased range of motion. Deformity present.     Left foot: Decreased range of motion. Deformity present.  Lymphadenopathy:     Cervical: No cervical adenopathy.  Skin:    General: Skin is warm and dry.     Findings: No rash.  Neurological:     Mental Status: She is alert.     Cranial Nerves: No cranial nerve deficit.     Sensory: No sensory deficit.  Psychiatric:        Mood and Affect: Mood is not anxious or depressed.        Speech: Speech normal.        Behavior: Behavior normal. Behavior is cooperative.        Thought Content: Thought content normal.        Judgment: Judgment normal.      Assessment and Plan The patient's preventative maintenance and recommended screening tests for an annual wellness exam were reviewed in full today. Brought up to date unless services declined.  Counselled on the importance of diet, exercise, and its role in overall health and mortality. The patient's FH and SH was reviewed, including their home life, tobacco status, and drug and alcohol status.     Vaccines: uptodate except td, given flu shot today Pap/DVE: not indicated Mammo:  Not interested, understands risk. Bone Density:  2013, due now q 5 years.. consider in spring.  Colon: Dr. Sharlett Iles. Procedure date: 10/29/2007 .Marland Kitchen no further indicated Smoking Status:none ETOH/ drug  [id number]  RESTLESS LEG SYNDROME Well controlled on requip  Mixed hyperlipidemia  At goal on current regimen.  Chronic insomnia Stable on trazodone.  Mixed Alzheimer's and vascular dementia ([place])  Stable at this point  Followed by neurology.  given mixed .Marland Kitchen  on aricept now.  GAD (generalized anxiety disorder) well controlled per pt today on zoloft  Seizures ([place])  Followed by neurology.  Aortic atherosclerosis ([facility]) LDL at goal < 70.  ILD (interstitial lung disease) ([place])  Followed by pulmonary.    Eliezer Lofts, MD

## 2019-05-08 DIAGNOSIS — G309 Alzheimer's disease, unspecified: Secondary | ICD-10-CM | POA: Diagnosis not present

## 2019-05-08 DIAGNOSIS — Z8673 Personal history of transient ischemic attack (TIA), and cerebral infarction without residual deficits: Secondary | ICD-10-CM | POA: Diagnosis not present

## 2019-05-08 DIAGNOSIS — R69 Illness, unspecified: Secondary | ICD-10-CM | POA: Diagnosis not present

## 2019-05-08 DIAGNOSIS — G2581 Restless legs syndrome: Secondary | ICD-10-CM | POA: Diagnosis not present

## 2019-05-08 DIAGNOSIS — G4733 Obstructive sleep apnea (adult) (pediatric): Secondary | ICD-10-CM | POA: Diagnosis not present

## 2019-05-08 DIAGNOSIS — H9193 Unspecified hearing loss, bilateral: Secondary | ICD-10-CM | POA: Diagnosis not present

## 2019-05-09 ENCOUNTER — Encounter: Payer: Self-pay | Admitting: Family Medicine

## 2019-05-09 ENCOUNTER — Ambulatory Visit (INDEPENDENT_AMBULATORY_CARE_PROVIDER_SITE_OTHER): Payer: Medicare HMO | Admitting: Family Medicine

## 2019-05-09 ENCOUNTER — Other Ambulatory Visit: Payer: Self-pay

## 2019-05-09 VITALS — BP 122/70 | HR 85 | Temp 97.7°F | Ht 60.0 in

## 2019-05-09 DIAGNOSIS — M19019 Primary osteoarthritis, unspecified shoulder: Secondary | ICD-10-CM | POA: Diagnosis not present

## 2019-05-09 MED ORDER — METHYLPREDNISOLONE ACETATE 40 MG/ML IJ SUSP
80.0000 mg | Freq: Once | INTRAMUSCULAR | Status: AC
  Administered 2019-05-09: 80 mg via INTRA_ARTICULAR

## 2019-05-09 NOTE — Progress Notes (Signed)
     Susan [name] T. Susan Rankin, MD Primary Care and [place] at [facility] [place], [address] Phone: [phone]  FAX: [phone]  Susan [name] - 78 y.o. female  MRN [id number]  Date of Birth: 01/30/1941  Visit Date: 05/09/2019  PCP: [name], MD  Referred by: [name], MD  Chief Complaint  Patient presents with  . Shoulder Pain    Right-wants injection    This visit occurred during the SARS-CoV-2 public health emergency.  Safety protocols were in place, including screening questions prior to the visit, additional usage of staff PPE, and extensive cleaning of exam room while observing appropriate contact time as indicated for disinfecting solutions.   Procedure only:  Intraarticular Shoulder Aspiration/Injection Procedure Note CLOVA GOYNE January 17, 1941 Date of procedure: 05/09/2019  Procedure: Large Joint Aspiration / Injection of Shoulder, Intraarticular, R Indications: Pain  Procedure Details Verbal consent was obtained from the patient. Risks including infection explained and contrasted with benefits and alternatives. Patient prepped with Chloraprep and Ethyl Chloride used for anesthesia. An intraarticular shoulder injection was performed using the posterior approach; needle placed into joint capsule without difficulty. The patient tolerated the procedure well and had decreased pain post injection. No complications. Injection: 8 cc of Lidocaine 1% and 2 mL Depo-Medrol 40 mg. Needle: 21 gauge, 2 inch   Signed,  Khadir Roam T. Yohan Samons, MD     ICD-10-CM   1. Glenohumeral arthritis, Severe  M19.019 methylPREDNISolone acetate (DEPO-MEDROL) injection 80 mg     Outpatient Encounter Medications as of 05/09/2019  Medication Sig  . albuterol (PROVENTIL HFA;VENTOLIN HFA) 108 (90 Base) MCG/ACT inhaler Inhale 2 puffs into the lungs every 6 (six) hours as needed for wheezing or shortness of breath.  .  budesonide-formoterol (SYMBICORT) 160-4.5 MCG/ACT inhaler TAKE 2 PUFFS INTO LUNGS TWICE A DAY  . diltiazem (CARDIZEM CD) 180 MG 24 hr capsule Take 1 capsule (180 mg total) by mouth daily.  Marland Kitchen donepezil (ARICEPT) 5 MG tablet Take 1 tablet by mouth daily.  . furosemide (LASIX) 20 MG tablet Take 1 tablet (20 mg total) by mouth daily as needed.  Marland Kitchen ibuprofen (ADVIL,MOTRIN) 200 MG tablet Take 600 mg by mouth every 6 (six) hours as needed for headache or moderate pain.   Marland Kitchen ipratropium-albuterol (DUONEB) 0.5-2.5 (3) MG/3ML SOLN Take 3 mLs by nebulization 3 (three) times daily.  . Melatonin 10 MG TABS Take 15 mg by mouth at bedtime.  . OXYGEN Inhale 2 L into the lungs daily.  Marland Kitchen rOPINIRole (REQUIP) 3 MG tablet Take 1 tablet (3 mg total) by mouth at bedtime.  . rosuvastatin (CRESTOR) 5 MG tablet Take 1 tablet (5 mg total) by mouth daily.  Marland Kitchen tiotropium (SPIRIVA) 18 MCG inhalation capsule Place 1 capsule (18 mcg total) into inhaler and inhale daily.  . traZODone (DESYREL) 50 MG tablet TAKE 1 & 1/2 TABLETS BY MOUTH AT BEDTIMEAS NEEDED FOR SLEEP  . ZOLOFT 25 MG tablet Take 1 tablet by mouth daily.   Facility-Administered Encounter Medications as of 05/09/2019  Medication  . dexamethasone (DECADRON) injection 4 mg  . [COMPLETED] methylPREDNISolone acetate (DEPO-MEDROL) injection 80 mg  . ondansetron (ZOFRAN) 4 mg in sodium chloride 0.9 % 50 mL IVPB  . ondansetron (ZOFRAN) 4 mg in sodium chloride 0.9 % 50 mL IVPB

## 2019-05-10 ENCOUNTER — Encounter: Payer: Self-pay | Admitting: Family Medicine

## 2019-05-22 DIAGNOSIS — M5137 Other intervertebral disc degeneration, lumbosacral region: Secondary | ICD-10-CM | POA: Diagnosis not present

## 2019-05-22 DIAGNOSIS — G309 Alzheimer's disease, unspecified: Secondary | ICD-10-CM | POA: Diagnosis not present

## 2019-05-22 DIAGNOSIS — J449 Chronic obstructive pulmonary disease, unspecified: Secondary | ICD-10-CM | POA: Diagnosis not present

## 2019-05-22 DIAGNOSIS — M25511 Pain in right shoulder: Secondary | ICD-10-CM | POA: Diagnosis not present

## 2019-05-22 DIAGNOSIS — I2510 Atherosclerotic heart disease of native coronary artery without angina pectoris: Secondary | ICD-10-CM | POA: Diagnosis not present

## 2019-05-22 DIAGNOSIS — I10 Essential (primary) hypertension: Secondary | ICD-10-CM | POA: Diagnosis not present

## 2019-05-22 DIAGNOSIS — I739 Peripheral vascular disease, unspecified: Secondary | ICD-10-CM | POA: Diagnosis not present

## 2019-05-22 DIAGNOSIS — R69 Illness, unspecified: Secondary | ICD-10-CM | POA: Diagnosis not present

## 2019-05-28 ENCOUNTER — Telehealth: Payer: Self-pay | Admitting: *Deleted

## 2019-05-28 ENCOUNTER — Other Ambulatory Visit: Payer: Self-pay

## 2019-05-28 ENCOUNTER — Ambulatory Visit (INDEPENDENT_AMBULATORY_CARE_PROVIDER_SITE_OTHER): Payer: Medicare HMO | Admitting: Family Medicine

## 2019-05-28 ENCOUNTER — Encounter: Payer: Self-pay | Admitting: Family Medicine

## 2019-05-28 VITALS — BP 120/82 | HR 81 | Temp 97.7°F | Ht 60.0 in

## 2019-05-28 DIAGNOSIS — J432 Centrilobular emphysema: Secondary | ICD-10-CM

## 2019-05-28 DIAGNOSIS — G309 Alzheimer's disease, unspecified: Secondary | ICD-10-CM

## 2019-05-28 DIAGNOSIS — F0280 Dementia in other diseases classified elsewhere without behavioral disturbance: Secondary | ICD-10-CM

## 2019-05-28 DIAGNOSIS — F0150 Vascular dementia without behavioral disturbance: Secondary | ICD-10-CM | POA: Diagnosis not present

## 2019-05-28 DIAGNOSIS — R4182 Altered mental status, unspecified: Secondary | ICD-10-CM | POA: Diagnosis not present

## 2019-05-28 LAB — POC URINALSYSI DIPSTICK (AUTOMATED)
Bilirubin, UA: NEGATIVE
Glucose, UA: NEGATIVE
Nitrite, UA: POSITIVE
Protein, UA: POSITIVE — AB
Spec Grav, UA: 1.025 (ref 1.010–1.025)
Urobilinogen, UA: 1 E.U./dL
pH, UA: 6 (ref 5.0–8.0)

## 2019-05-28 MED ORDER — TIOTROPIUM BROMIDE MONOHYDRATE 18 MCG IN CAPS: 18.0000 ug | ORAL_CAPSULE | Freq: Every day | RESPIRATORY_TRACT | 12 refills | Status: AC

## 2019-05-28 MED ORDER — BUDESONIDE-FORMOTEROL FUMARATE 160-4.5 MCG/ACT IN AERO: INHALATION_SPRAY | RESPIRATORY_TRACT | 11 refills | Status: AC

## 2019-05-28 MED ORDER — CEPHALEXIN 500 MG PO CAPS: 500.0000 mg | ORAL_CAPSULE | Freq: Three times a day (TID) | ORAL | 0 refills | Status: DC

## 2019-05-28 NOTE — Progress Notes (Signed)
Chief Complaint  Patient presents with  . Altered Mental Status    History of Present Illness: HPI   79 year old female with emphesema, ILD CAD and  Mixed alzheimer'schronic ischemic dementia presents with her daughter with change in her mental status.  Daughter reports  she has been more irritable and slightly worse memory issue  In last 5 days.  Her urine is darker and stronger in odor.  She wears pulll ups. Unclear frequency.  No dysuria, no abdominal pain.  Called [facility] stating she couldn't breath.. EMS said she  Had normal vitals, nml CBG.  Now at baseline breathing per pt. No cpough, nonproductive.  No fever.  Daughter has not noted any change in breathing.  LAst UTI.. reviewed previous hosptial notes: ECOli and Klebsiella in 09/2017 resistant to nitrofurantoin.  This visit occurred during the SARS-CoV-2 public health emergency.  Safety protocols were in place, including screening questions prior to the visit, additional usage of staff PPE, and extensive cleaning of exam room while observing appropriate contact time as indicated for disinfecting solutions.   COVID 19 screen:  No recent travel or known exposure to COVID19 The patient denies respiratory symptoms of COVID 19 at this time. The importance of social distancing was discussed today.     Review of Systems  Constitutional: Negative for chills and fever.  HENT: Negative for congestion and ear pain.   Eyes: Negative for pain and redness.  Respiratory: Positive for shortness of breath and wheezing. Negative for cough.   Cardiovascular: Negative for chest pain, palpitations and leg swelling.  Gastrointestinal: Negative for abdominal pain, blood in stool, constipation, diarrhea, nausea and vomiting.  Genitourinary: Negative for dysuria.  Musculoskeletal: Negative for falls and myalgias.  Skin: Negative for rash.  Neurological: Negative for dizziness.  Psychiatric/Behavioral: Negative for depression. The patient  is not nervous/anxious.       Past Medical History:  Diagnosis Date  . Acute peptic ulcer, unspecified site, with hemorrhage and perforation, without mention of obstruction   . Arthritis   . Asthma   . Balance problems    unsteady on feet at time  . Cerebral amyloid angiopathy ([place])    a. 06/2017 admit w/ aphasia and blurred vision.  RI w/ extensive chronic microhemorrhages c/w amyloid angiopathy-->ASA initiated.  . Chronic airway obstruction, not elsewhere classified    on inhalers  . Diastolic dysfunction    a. 06/2017 Echo: EF 50-55%, no rwma, Gr1 DD, nl LA size, nl RV fxn.  . Diverticulosis of colon (without mention of hemorrhage)   . Hematochezia   . 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 Morbid obesity ([place])   . Non-obstructive CAD (coronary artery disease)    a. 12/2012 MV: EF 71%, no ischemia; b. 06/2016 Cath: LM nl, LAD 50p, LCX nl, RCA 43m, EF 55-65%.  . OSA (obstructive sleep apnea)   . Osteoarthritis    a. s/p bilat hip replacements.  Marland Kitchen UTI (urinary tract infection)    a. 09/2017 admit for E coli & Klebsiella UTI.  . Ventral hernia     reports that she quit smoking about 18 years ago. Her smoking use included cigarettes. She has a 20.00 pack-year smoking history. She has never used smokeless tobacco. She reports that she does not drink alcohol or use drugs.   Current Outpatient Medications:  .  albuterol (PROVENTIL HFA;VENTOLIN HFA) 108 (90 Base) MCG/ACT inhaler, Inhale 2 puffs into the lungs every 6 (six) hours as needed for wheezing or shortness  of breath., Disp: 1 Inhaler, Rfl: 2 .  budesonide-formoterol (SYMBICORT) 160-4.5 MCG/ACT inhaler, TAKE 2 PUFFS INTO LUNGS TWICE A DAY, Disp: 1 Inhaler, Rfl: 11 .  diltiazem (CARDIZEM CD) 180 MG 24 hr capsule, Take 1 capsule (180 mg total) by mouth daily., Disp: 90 capsule, Rfl: 3 .  donepezil (ARICEPT) 5 MG tablet, Take 1 tablet by mouth daily., Disp: , Rfl:  .  furosemide (LASIX) 20 MG tablet, Take 1 tablet (20 mg  total) by mouth daily as needed., Disp: 30 tablet, Rfl: 3 .  ibuprofen (ADVIL,MOTRIN) 200 MG tablet, Take 600 mg by mouth every 6 (six) hours as needed for headache or moderate pain. , Disp: , Rfl:  .  ipratropium-albuterol (DUONEB) 0.5-2.5 (3) MG/3ML SOLN, Take 3 mLs by nebulization 3 (three) times daily., Disp: 360 mL, Rfl: 5 .  Melatonin 10 MG TABS, Take 15 mg by mouth at bedtime., Disp: , Rfl:  .  OXYGEN, Inhale 2 L into the lungs daily., Disp: , Rfl:  .  rOPINIRole (REQUIP) 3 MG tablet, Take 1 tablet (3 mg total) by mouth at bedtime., Disp: 90 tablet, Rfl: 1 .  rosuvastatin (CRESTOR) 5 MG tablet, Take 1 tablet (5 mg total) by mouth daily., Disp: 90 tablet, Rfl: 3 .  tiotropium (SPIRIVA) 18 MCG inhalation capsule, Place 1 capsule (18 mcg total) into inhaler and inhale daily., Disp: 30 capsule, Rfl: 12 .  traZODone (DESYREL) 50 MG tablet, TAKE 1 & 1/2 TABLETS BY MOUTH AT BEDTIMEAS NEEDED FOR SLEEP, Disp: 135 tablet, Rfl: 1 .  ZOLOFT 25 MG tablet, Take 1 tablet by mouth daily., Disp: , Rfl:  No current facility-administered medications for this visit.  Facility-Administered Medications Ordered in Other Visits:  .  dexamethasone (DECADRON) injection 4 mg, 4 mg, Intravenous, Once, Swinteck, Aaron Edelman, MD .  ondansetron (ZOFRAN) 4 mg in sodium chloride 0.9 % 50 mL IVPB, 4 mg, Intravenous, Once, Swinteck, Aaron Edelman, MD .  ondansetron (ZOFRAN) 4 mg in sodium chloride 0.9 % 50 mL IVPB, 4 mg, Intravenous, Once, Swinteck, Aaron Edelman, MD   Observations/Objective: Blood pressure 120/82, pulse 81, temperature 97.7 F (36.5 C), temperature source Temporal, height 5' (1.524 m), SpO2 96 %.  Physical Exam Constitutional:      General: She is not in acute distress.    Appearance: Normal appearance. She is well-developed. She is obese. She is not ill-appearing or toxic-appearing.     Comments: in wheelchair  HENT:     Head: Normocephalic.     Right Ear: Hearing, tympanic membrane, ear canal and external ear normal.      Left Ear: Hearing, tympanic membrane, ear canal and external ear normal.     Nose: Nose normal. No congestion.  Eyes:     General: Lids are normal. Lids are everted, no foreign bodies appreciated.     Conjunctiva/sclera: Conjunctivae normal.     Pupils: Pupils are equal, round, and reactive to light.  Neck:     Thyroid: No thyroid mass or thyromegaly.     Vascular: No carotid bruit.     Trachea: Trachea normal.  Cardiovascular:     Rate and Rhythm: Normal rate and regular rhythm.     Heart sounds: Normal heart sounds, S1 normal and S2 normal. No murmur. No gallop.   Pulmonary:     Effort: Pulmonary effort is normal. No respiratory distress.     Breath sounds: Normal breath sounds. No wheezing, rhonchi or rales.  Abdominal:     General: Bowel sounds are normal. There  is no distension or abdominal bruit.     Palpations: Abdomen is soft. There is no fluid wave or mass.     Tenderness: There is no abdominal tenderness. There is no guarding or rebound.     Hernia: No hernia is present.  Musculoskeletal:     Cervical back: Normal range of motion and neck supple.  Lymphadenopathy:     Cervical: No cervical adenopathy.  Skin:    General: Skin is warm and dry.     Findings: No rash.  Neurological:     Mental Status: She is alert and oriented to person, place, and time. Mental status is at baseline.     Cranial Nerves: No cranial nerve deficit.     Sensory: No sensory deficit.  Psychiatric:        Mood and Affect: Mood normal. Mood is not anxious or depressed.        Speech: Speech normal.        Behavior: Behavior normal. Behavior is cooperative.      Assessment and Plan Mixed Alzheimer's and vascular dementia ([place]) Noew on aricept. Slight decline in MS may be progressive dementia decline.  Altered mental status  Given urinary symptom change and pt history.. eval with UA and culture. UA concerning for UTI... will treat with antibiotics.  nml neuro exam. ? Progression of  dementia vs SE to aricept. No clear respiratory change or other sign of infection.        Eliezer Lofts, MD

## 2019-05-28 NOTE — Telephone Encounter (Signed)
Received fax from [employer] requesting PA for Spiriva.  PA completed on CoverMyMeds.  Sent for review.  Can take up to 3 business days for a decision.   *Formulary Alternative: Incruse Ellipta.

## 2019-05-28 NOTE — Assessment & Plan Note (Addendum)
Given urinary symptom change and pt history.. eval with UA and culture. UA concerning for UTI... will treat with antibiotics.  nml neuro exam. ? Progression of dementia vs SE to aricept. No clear respiratory change or other sign of infection.

## 2019-05-28 NOTE — Patient Instructions (Signed)
Keep u[p with fluids.  Start antibiotics.  We will call with urine culture results.  Call if not improving.Marland Kitchen go to ER for severe shortness of breath.

## 2019-05-28 NOTE — Assessment & Plan Note (Signed)
Noew on aricept. Slight decline in MS may be progressive dementia decline.

## 2019-05-29 LAB — URINE CULTURE
MICRO NUMBER:: 10008506
SPECIMEN QUALITY:: ADEQUATE

## 2019-05-29 NOTE — Telephone Encounter (Signed)
PA approved for Spirva from 05/24/2019 through 05/22/2020.  [facility] notified of approval via fax.

## 2019-06-01 ENCOUNTER — Inpatient Hospital Stay
Admission: EM | Admit: 2019-06-01 | Discharge: 2019-06-05 | DRG: 177 | Disposition: A | Discharge status: Home Health | Payer: Medicare HMO | Attending: Internal Medicine | Admitting: Internal Medicine

## 2019-06-01 ENCOUNTER — Other Ambulatory Visit: Payer: Self-pay

## 2019-06-01 ENCOUNTER — Emergency Department: Payer: Medicare HMO

## 2019-06-01 ENCOUNTER — Encounter: Payer: Self-pay | Admitting: *Deleted

## 2019-06-01 DIAGNOSIS — I110 Hypertensive heart disease with heart failure: Secondary | ICD-10-CM | POA: Diagnosis present

## 2019-06-01 DIAGNOSIS — U071 COVID-19: Secondary | ICD-10-CM

## 2019-06-01 DIAGNOSIS — Z96643 Presence of artificial hip joint, bilateral: Secondary | ICD-10-CM | POA: Diagnosis present

## 2019-06-01 DIAGNOSIS — F411 Generalized anxiety disorder: Secondary | ICD-10-CM | POA: Diagnosis present

## 2019-06-01 DIAGNOSIS — Z8711 Personal history of peptic ulcer disease: Secondary | ICD-10-CM | POA: Diagnosis not present

## 2019-06-01 DIAGNOSIS — G4733 Obstructive sleep apnea (adult) (pediatric): Secondary | ICD-10-CM | POA: Diagnosis present

## 2019-06-01 DIAGNOSIS — W19XXXA Unspecified fall, initial encounter: Secondary | ICD-10-CM | POA: Diagnosis present

## 2019-06-01 DIAGNOSIS — R4182 Altered mental status, unspecified: Secondary | ICD-10-CM

## 2019-06-01 DIAGNOSIS — Z6839 Body mass index (BMI) 39.0-39.9, adult: Secondary | ICD-10-CM | POA: Diagnosis not present

## 2019-06-01 DIAGNOSIS — J449 Chronic obstructive pulmonary disease, unspecified: Secondary | ICD-10-CM

## 2019-06-01 DIAGNOSIS — Z87891 Personal history of nicotine dependence: Secondary | ICD-10-CM | POA: Diagnosis not present

## 2019-06-01 DIAGNOSIS — J432 Centrilobular emphysema: Secondary | ICD-10-CM | POA: Diagnosis present

## 2019-06-01 DIAGNOSIS — N390 Urinary tract infection, site not specified: Secondary | ICD-10-CM

## 2019-06-01 DIAGNOSIS — Z79899 Other long term (current) drug therapy: Secondary | ICD-10-CM

## 2019-06-01 DIAGNOSIS — F0150 Vascular dementia without behavioral disturbance: Secondary | ICD-10-CM | POA: Diagnosis present

## 2019-06-01 DIAGNOSIS — J441 Chronic obstructive pulmonary disease with (acute) exacerbation: Secondary | ICD-10-CM

## 2019-06-01 DIAGNOSIS — Z96653 Presence of artificial knee joint, bilateral: Secondary | ICD-10-CM | POA: Diagnosis present

## 2019-06-01 DIAGNOSIS — J1282 Pneumonia due to coronavirus disease 2019: Secondary | ICD-10-CM | POA: Diagnosis present

## 2019-06-01 DIAGNOSIS — Z791 Long term (current) use of non-steroidal anti-inflammatories (NSAID): Secondary | ICD-10-CM

## 2019-06-01 DIAGNOSIS — I25110 Atherosclerotic heart disease of native coronary artery with unstable angina pectoris: Secondary | ICD-10-CM | POA: Diagnosis present

## 2019-06-01 DIAGNOSIS — B9620 Unspecified Escherichia coli [E. coli] as the cause of diseases classified elsewhere: Secondary | ICD-10-CM | POA: Diagnosis present

## 2019-06-01 DIAGNOSIS — F05 Delirium due to known physiological condition: Secondary | ICD-10-CM | POA: Diagnosis present

## 2019-06-01 DIAGNOSIS — G309 Alzheimer's disease, unspecified: Secondary | ICD-10-CM | POA: Diagnosis present

## 2019-06-01 DIAGNOSIS — Z9181 History of falling: Secondary | ICD-10-CM

## 2019-06-01 DIAGNOSIS — Z833 Family history of diabetes mellitus: Secondary | ICD-10-CM

## 2019-06-01 DIAGNOSIS — R296 Repeated falls: Secondary | ICD-10-CM | POA: Diagnosis present

## 2019-06-01 DIAGNOSIS — Z7189 Other specified counseling: Secondary | ICD-10-CM | POA: Diagnosis not present

## 2019-06-01 DIAGNOSIS — F0280 Dementia in other diseases classified elsewhere without behavioral disturbance: Secondary | ICD-10-CM | POA: Diagnosis present

## 2019-06-01 DIAGNOSIS — Z7951 Long term (current) use of inhaled steroids: Secondary | ICD-10-CM

## 2019-06-01 DIAGNOSIS — J9621 Acute and chronic respiratory failure with hypoxia: Secondary | ICD-10-CM | POA: Diagnosis present

## 2019-06-01 DIAGNOSIS — E669 Obesity, unspecified: Secondary | ICD-10-CM | POA: Diagnosis present

## 2019-06-01 DIAGNOSIS — N3000 Acute cystitis without hematuria: Secondary | ICD-10-CM

## 2019-06-01 DIAGNOSIS — I509 Heart failure, unspecified: Secondary | ICD-10-CM | POA: Diagnosis present

## 2019-06-01 DIAGNOSIS — Z8249 Family history of ischemic heart disease and other diseases of the circulatory system: Secondary | ICD-10-CM

## 2019-06-01 DIAGNOSIS — Z79890 Hormone replacement therapy (postmenopausal): Secondary | ICD-10-CM

## 2019-06-01 DIAGNOSIS — G9341 Metabolic encephalopathy: Secondary | ICD-10-CM | POA: Diagnosis present

## 2019-06-01 DIAGNOSIS — Z515 Encounter for palliative care: Secondary | ICD-10-CM | POA: Diagnosis not present

## 2019-06-01 DIAGNOSIS — Z841 Family history of disorders of kidney and ureter: Secondary | ICD-10-CM

## 2019-06-01 DIAGNOSIS — J849 Interstitial pulmonary disease, unspecified: Secondary | ICD-10-CM | POA: Diagnosis present

## 2019-06-01 DIAGNOSIS — Z9981 Dependence on supplemental oxygen: Secondary | ICD-10-CM

## 2019-06-01 LAB — URINALYSIS, COMPLETE (UACMP) WITH MICROSCOPIC
Bilirubin Urine: NEGATIVE
Glucose, UA: NEGATIVE mg/dL
Hgb urine dipstick: NEGATIVE
Ketones, ur: NEGATIVE mg/dL
Nitrite: POSITIVE — AB
Protein, ur: 30 mg/dL — AB
Specific Gravity, Urine: 1.021 (ref 1.005–1.030)
pH: 5 (ref 5.0–8.0)

## 2019-06-01 LAB — COMPREHENSIVE METABOLIC PANEL
ALT: 17 U/L (ref 0–44)
AST: 23 U/L (ref 15–41)
Albumin: 3.8 g/dL (ref 3.5–5.0)
Alkaline Phosphatase: 89 U/L (ref 38–126)
Anion gap: 12 (ref 5–15)
BUN: 17 mg/dL (ref 8–23)
CO2: 25 mmol/L (ref 22–32)
Calcium: 8.6 mg/dL — ABNORMAL LOW (ref 8.9–10.3)
Chloride: 100 mmol/L (ref 98–111)
Creatinine, Ser: 0.76 mg/dL (ref 0.44–1.00)
GFR calc Af Amer: >60 mL/min (ref >60)
GFR calc non Af Amer: >60 mL/min (ref >60)
Glucose, Bld: 93 mg/dL (ref 70–99)
Potassium: 3.6 mmol/L (ref 3.5–5.1)
Sodium: 137 mmol/L (ref 135–145)
Total Bilirubin: 0.9 mg/dL (ref 0.3–1.2)
Total Protein: 7.6 g/dL (ref 6.5–8.1)

## 2019-06-01 LAB — CBC
HCT: 43.4 % (ref 36.0–46.0)
Hemoglobin: 14.1 g/dL (ref 12.0–15.0)
MCH: 29.3 pg (ref 26.0–34.0)
MCHC: 32.5 g/dL (ref 30.0–36.0)
MCV: 90.2 fL (ref 80.0–100.0)
Platelets: 192 10*3/uL (ref 150–400)
RBC: 4.81 MIL/uL (ref 3.87–5.11)
RDW: 14.3 % (ref 11.5–15.5)
WBC: 6.3 10*3/uL (ref 4.0–10.5)
nRBC: 0 % (ref 0.0–0.2)

## 2019-06-01 LAB — TROPONIN I (HIGH SENSITIVITY): Troponin I (High Sensitivity): 12 ng/L (ref <18)

## 2019-06-01 LAB — LACTIC ACID, PLASMA: Lactic Acid, Venous: 1.8 mmol/L (ref 0.5–1.9)

## 2019-06-01 MED ORDER — SODIUM CHLORIDE 0.45 % IV SOLN: INTRAVENOUS | Status: DC

## 2019-06-01 MED ORDER — ALBUTEROL SULFATE (2.5 MG/3ML) 0.083% IN NEBU: 2.5000 mg | INHALATION_SOLUTION | RESPIRATORY_TRACT | Status: DC | PRN

## 2019-06-01 MED ORDER — ENOXAPARIN SODIUM 40 MG/0.4ML ~~LOC~~ SOLN
40.0000 mg | SUBCUTANEOUS | Status: DC
  Administered 2019-06-01 – 2019-06-04 (×4): 40 mg via SUBCUTANEOUS
  Filled 2019-06-01 (×4): qty 0.4

## 2019-06-01 MED ORDER — SODIUM CHLORIDE 0.9% FLUSH
3.0000 mL | Freq: Once | INTRAVENOUS | Status: AC
  Administered 2019-06-01: 3 mL via INTRAVENOUS

## 2019-06-01 MED ORDER — SODIUM CHLORIDE 0.9 % IV SOLN
1.0000 g | INTRAVENOUS | Status: DC
  Administered 2019-06-01 – 2019-06-04 (×4): 1 g via INTRAVENOUS
  Filled 2019-06-01 (×2): qty 1
  Filled 2019-06-01 (×3): qty 10

## 2019-06-01 MED ORDER — METHYLPREDNISOLONE SODIUM SUCC 125 MG IJ SOLR
125.0000 mg | Freq: Once | INTRAMUSCULAR | Status: AC
  Administered 2019-06-01: 21:00:00 125 mg via INTRAVENOUS
  Filled 2019-06-01: qty 2

## 2019-06-01 MED ORDER — PREDNISONE 20 MG PO TABS: 40.0000 mg | ORAL_TABLET | Freq: Every day | ORAL | Status: DC

## 2019-06-01 MED ORDER — IPRATROPIUM-ALBUTEROL 0.5-2.5 (3) MG/3ML IN SOLN
3.0000 mL | Freq: Four times a day (QID) | RESPIRATORY_TRACT | Status: DC
  Administered 2019-06-01 – 2019-06-02 (×2): 3 mL via RESPIRATORY_TRACT
  Filled 2019-06-01 (×3): qty 3

## 2019-06-01 MED ORDER — SODIUM CHLORIDE 0.9 % IV SOLN
1.0000 g | Freq: Once | INTRAVENOUS | Status: AC
  Administered 2019-06-01: 1 g via INTRAVENOUS
  Filled 2019-06-01: qty 10

## 2019-06-01 MED ORDER — ALBUTEROL SULFATE HFA 108 (90 BASE) MCG/ACT IN AERS
4.0000 | INHALATION_SPRAY | Freq: Once | RESPIRATORY_TRACT | Status: AC
  Administered 2019-06-01: 4 via RESPIRATORY_TRACT
  Filled 2019-06-01: qty 6.7

## 2019-06-01 MED ORDER — METHYLPREDNISOLONE SODIUM SUCC 40 MG IJ SOLR
40.0000 mg | Freq: Four times a day (QID) | INTRAMUSCULAR | Status: DC
  Administered 2019-06-01 – 2019-06-02 (×2): 40 mg via INTRAVENOUS
  Filled 2019-06-01 (×2): qty 1

## 2019-06-01 NOTE — ED Triage Notes (Signed)
Difficulty recalling daughter's name.

## 2019-06-01 NOTE — ED Provider Notes (Signed)
[facility] Emergency Department Provider Note   ____________________________________________   First MD Initiated Contact with Patient 06/01/19 1915     (approximate)  I have reviewed the triage vital signs and the nursing notes.   HISTORY  Chief Complaint Altered Mental Status    HPI Susan [name] is a 79 y.o. female with past medical history of dementia, CHF, CAD, and asthma who presents to the ED for altered mental status.  History is provided by patient's daughter, who visits the patient regularly and states that she has not been acting like her usual self for the past 1 to 2 weeks.  Daughter states that she has found the patient down on the ground on multiple occasions at that she has seemed more confused.  She was evaluated by her PCP for this problem, but was told she did not have a UTI at that time.  Daughter states that she has continued to worsen since then, and that patient lives alone, is currently unable to care for self.  She has not had any recent fevers, cough, shortness of breath, and has not appeared to be in any pain.        Past Medical History:  Diagnosis Date  . Acute peptic ulcer, unspecified site, with hemorrhage and perforation, without mention of obstruction   . Arthritis   . Asthma   . Balance problems    unsteady on feet at time  . Cerebral amyloid angiopathy ([place])    a. 06/2017 admit w/ aphasia and blurred vision.  RI w/ extensive chronic microhemorrhages c/w amyloid angiopathy-->ASA initiated.  . Chronic airway obstruction, not elsewhere classified    on inhalers  . Diastolic dysfunction    a. 06/2017 Echo: EF 50-55%, no rwma, Gr1 DD, nl LA size, nl RV fxn.  . Diverticulosis of colon (without mention of hemorrhage)   . Hematochezia   . 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 Morbid obesity ([place])   . Non-obstructive CAD (coronary artery disease)    a. 12/2012 MV: EF 71%, no ischemia; b. 06/2016 Cath: LM nl,  LAD 50p, LCX nl, RCA 67m, EF 55-65%.  . OSA (obstructive sleep apnea)   . Osteoarthritis    a. s/p bilat hip replacements.  Marland Kitchen UTI (urinary tract infection)    a. 09/2017 admit for E coli & Klebsiella UTI.  . Ventral hernia     Patient Active Problem List   Diagnosis Date Noted  . UTI (urinary tract infection) 06/01/2019  . Acute metabolic encephalopathy [id number]  . Frequent falls 06/01/2019  . Altered mental status 05/28/2019  . Mixed Alzheimer's and vascular dementia ([place]) 05/28/2019  . Seizures ([place]) 11/02/2017  . GAD (generalized anxiety disorder) 08/11/2017  . Hypoxia 08/11/2017  . Unstable angina ([place]) 07/08/2016  . Mixed hyperlipidemia   . Aortic atherosclerosis ([place]) 07/06/2016  . Coronary artery disease involving native coronary artery of native heart with unstable angina pectoris ([place]) 07/06/2016  . Acute on chronic respiratory failure with hypoxia ([place]) 04/18/2016  . Balance problem 10/08/2015  . Hx of urinary tract infection 06/23/2015  . Chronic insomnia 12/09/2014  . Primary osteoarthritis of left knee 10/03/2014  . Primary osteoarthritis of right knee 08/08/2014  . COPD with acute exacerbation ([place]) 10/22/2013  . Glenohumeral arthritis, Severe 08/20/2013  . Solitary pulmonary nodule 02/19/2013  . Shortness of breath 02/12/2013  . Obstructive sleep apnea 10/11/2012  . Umbilical hernia [id number]  . Urinary incontinence, urge 01/17/2012  . Diastolic dysfunction [id number]  .  Preoperative cardiovascular examination 04/21/2011  . DDD (degenerative disc disease) 10/26/2010  . RESTLESS LEG SYNDROME 02/23/2010  . HIP REPLACEMENT, LEFT, HX OF 12/21/2009  . FOOT PAIN, BILATERAL 03/25/2009  . HYPERCHOLESTEROLEMIA 06/18/2008  . ALLERGIC RHINITIS 05/21/2008  . ANKLE PAIN, BILATERAL 05/21/2008  . DIVERTICULOSIS, COLON 11/26/2007  . 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  APPENDECTOMY    . BUNIONECTOMY Bilateral   . COSMETIC SURGERY  1970's   abdomen. Tummy Tuck  . FOOT SURGERY Bilateral dec 2015 right    left 3 yrs ago  . RIGHT/LEFT HEART CATH AND CORONARY ANGIOGRAPHY Bilateral 07/08/2016   Procedure: Right/Left Heart Cath and Coronary Angiography;  Surgeon: Minna Merritts, MD;  Location: [facility];  Service: Cardiovascular;  Laterality: Bilateral;  . TOTAL HIP ARTHROPLASTY  2007   left  . TOTAL HIP ARTHROPLASTY     bilateral   . TOTAL KNEE ARTHROPLASTY Right 08/08/2014   Procedure: RIGHT TOTAL KNEE ARTHROPLASTY WITH NAVIGATION;  Surgeon: Rod Can, MD;  Location: [facility];  Service: Orthopedics;  Laterality: Right;  . TOTAL KNEE ARTHROPLASTY Left 10/03/2014   Procedure: LEFT TOTAL KNEE ARTHROPLASTY;  Surgeon: Rod Can, MD;  Location: [facility];  Service: Orthopedics;  Laterality: Left;  . TUBAL LIGATION    . UMBILICAL HERNIA REPAIR     2016    Prior to Admission medications   Medication Sig Start Date End Date Taking? Authorizing Provider  budesonide-formoterol (SYMBICORT) 160-4.5 MCG/ACT inhaler TAKE 2 PUFFS INTO LUNGS TWICE A DAY Patient taking differently: Inhale 2 puffs into the lungs 2 (two) times daily.  05/28/19  Yes Bedsole, Amy E, MD  cephALEXin (KEFLEX) 500 MG capsule Take 1 capsule (500 mg total) by mouth 3 (three) times daily. 05/28/19  Yes Bedsole, Amy E, MD  diltiazem (CARDIZEM CD) 180 MG 24 hr capsule Take 1 capsule (180 mg total) by mouth daily. 03/05/19  Yes Gollan, Kathlene November, MD  donepezil (ARICEPT) 5 MG tablet Take 5 mg by mouth daily.  05/08/19 05/07/20 Yes [provider]  rosuvastatin (CRESTOR) 5 MG tablet Take 1 tablet (5 mg total) by mouth daily. 03/05/19  Yes Minna Merritts, MD  tiotropium (SPIRIVA) 18 MCG inhalation capsule Place 1 capsule (18 mcg total) into inhaler and inhale daily. 05/28/19  Yes Bedsole, Amy E, MD  traZODone (DESYREL) 50 MG tablet TAKE 1 & 1/2 TABLETS BY MOUTH AT [facility] NEEDED FOR  SLEEP Patient taking differently: Take 75 mg by mouth at bedtime as needed for sleep.  04/13/18  Yes Bedsole, Amy E, MD  ZOLOFT 25 MG tablet Take 25 mg by mouth daily.  04/10/19  Yes [provider]  OXYGEN Inhale 2 L into the lungs daily.    [provider]    Allergies Patient has no known allergies.  Family History  Problem Relation Age of Onset  . Diabetes Mother   . Coronary artery disease Mother   . Lung disease Mother   . Heart disease Mother        CAD  . Coronary artery disease Father   . Kidney disease Father   . Diabetes Father   . Heart disease Father        CAD  . Cancer Neg Hx     Social History Social History   Tobacco Use  . Smoking status: Former Smoker    Packs/day: 0.50    Years: 40.00  Pack years: 20.00    Types: Cigarettes    Quit date: 08/29/2000    Years since quitting: 18.7  . Smokeless tobacco: Never Used  Substance Use Topics  . Alcohol use: No    Alcohol/week: 0.0 standard drinks    Comment: rare  . Drug use: No    Review of Systems Unable to obtain secondary to altered mental status  ____________________________________________   PHYSICAL EXAM:  VITAL SIGNS: ED Triage Vitals [06/01/19 1912]  Enc Vitals Group     BP      Pulse      Resp      Temp      Temp src      SpO2 (!) 86 %     Weight      Height      Head Circumference      Peak Flow      Pain Score      Pain Loc      Pain Edu?      Excl. in [place]?     Constitutional: Alert and oriented to person and place but not time. Eyes: Conjunctivae are normal. Head: Atraumatic. Nose: No congestion/rhinnorhea. Mouth/Throat: Mucous membranes are moist. Neck: Normal ROM Cardiovascular: Normal rate, regular rhythm. Grossly normal heart sounds. Respiratory: Tachypneic with normal respiratory effort.  No retractions. Lungs with expiratory wheezing bilaterally. Gastrointestinal: Soft and nontender. No distention. Genitourinary: deferred Musculoskeletal: No  lower extremity tenderness nor edema. Neurologic:  Normal speech and language. No gross focal neurologic deficits are appreciated. Skin:  Skin is warm, dry and intact. No rash noted. Psychiatric: Mood and affect are normal. Speech and behavior are normal.  ____________________________________________   LABS (all labs ordered are listed, but only abnormal results are displayed)  Labs Reviewed  COMPREHENSIVE METABOLIC PANEL - Abnormal; Notable for the following components:      Result Value   Calcium 8.6 (*)    All other components within normal limits  URINALYSIS, COMPLETE (UACMP) WITH MICROSCOPIC - Abnormal; Notable for the following components:   Color, Urine AMBER (*)    APPearance HAZY (*)    Protein, ur 30 (*)    Nitrite POSITIVE (*)    Leukocytes,Ua TRACE (*)    Bacteria, UA MANY (*)    All other components within normal limits  CULTURE, BLOOD (ROUTINE X 2)  CULTURE, BLOOD (ROUTINE X 2)  URINE CULTURE  SARS CORONAVIRUS 2 (TAT 6-24 HRS)  CBC  LACTIC ACID, PLASMA  HIV ANTIBODY (ROUTINE TESTING W REFLEX)  CBG MONITORING, ED  TROPONIN I (HIGH SENSITIVITY)  TROPONIN I (HIGH SENSITIVITY)   ____________________________________________  EKG  ED ECG REPORT I, Blake Divine, the attending physician, personally viewed and interpreted this ECG.   Date: 06/02/2019  EKG Time: 19:28  Rate: 89  Rhythm: normal sinus rhythm  Axis: Normal  Intervals:Prolonged QT  ST&T Change: None   PROCEDURES  Procedure(s) performed (including Critical Care):  Procedures   ____________________________________________   INITIAL IMPRESSION / ASSESSMENT AND PLAN / ED COURSE       79 year old female with history of dementia presents to the ED with altered mental status over the past 1 to 2 weeks, lives alone but daughter has noticed multiple falls recently as well as increased confusion.  Patient has no focal neurologic deficits on exam, but given reported falls, CT head and C-spine  were obtained.  These were negative for acute process, chest x-ray performed and also negative.  Patient does appear to have a UTI, which could  be contributing to her increased confusion.  We will treat with Rocephin for now, her vital signs are not consistent with sepsis.  She is slightly tachypneic, however I suspect this is related to a COPD exacerbation given her wheezing.  She was treated with albuterol and steroids with improvement, now breathing more comfortably.  We will plan on admission given both COPD and UTI are making it impossible for patient to care for herself at home.      ____________________________________________   FINAL CLINICAL IMPRESSION(S) / ED DIAGNOSES  Final diagnoses:  Altered mental status, unspecified altered mental status type  Acute cystitis without hematuria  Multiple falls     ED Discharge Orders    None       Note:  This document was prepared using Dragon voice recognition software and may include unintentional dictation errors.   Blake Divine, MD 06/02/19 [id number]

## 2019-06-01 NOTE — ED Triage Notes (Signed)
Fall today x 2. EMS reports frequent calls to this person's residence. Daughter's feel as if pt has altered mental status. Pt 86% RA. Pt on 4L / EMS. Pt has poor mobility @ baseline due to rheumatoid arthritis. Pt did not have life alert pendant with her which is unusual.

## 2019-06-01 NOTE — ED Notes (Signed)
Patient transported to CT 

## 2019-06-01 NOTE — H&P (Signed)
History and Physical    Susan [name] [id number] DOB: [birth year] DOA: 06/01/2019  PCP: [name], MD   Patient coming from: home  I have personally briefly reviewed patient's old medical records in [place]  Chief Complaint: fall today, confusion, somnolence x 2 days  HPI: Susan [name] is a 79 y.o. female with medical history significant for COPD on home O2 at 2 L, hypertension, dementia, peptic ulcer disease, disabling arthritis, dependent on motorized wheelchair, as well as history of falls in the past but who lives alone at home independently, who was brought into the emergency room because of confusion and somnolence noticed by her daughters who check in on her.She sustained a fall on the day of arrival.  She denied chest pains, cough fever or chills.  No complaints of nausea vomiting or diarrhea.  Most of the history provided by daughter at bedside on arrival of EMS she was found to be wheezing with O2 sats 86% on room air.   ED Course: On arrival in the emergency room, she required up to 4 L to maintain sats at 97.  She was afebrile at 97. 7 blood pressure 120/82 with heart rate of 81.  Her CBC and basic metabolic panel will for the most part unremarkable.  Lactic acid 1.8.  Urinalysis showed positive nitrites and small leukocyte esterase.  Chest x-ray showed no acute disease and EKG showed normal sinus rhythm with no acute ST-T wave changes.  She had a CT scan of the head and C-spine which showed no acute injury.  Patient was wheezing in the emergency room and received methylprednisolone as well as IV Rocephin.  Hospitalist consulted for admission. Review of Systems: As per HPI otherwise 10 point review of systems negative.  Daughter assists with review of systems   Past Medical History:  Diagnosis Date  . Acute peptic ulcer, unspecified site, with hemorrhage and perforation, without mention of obstruction   . Arthritis   . Asthma   . Balance problems    unsteady  on feet at time  . 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 06/2017 admit w/ aphasia and blurred vision.  RI w/ extensive chronic microhemorrhages c/w amyloid angiopathy-->ASA initiated.  . Chronic airway obstruction, not elsewhere classified    on inhalers  . Diastolic dysfunction    a. 06/2017 Echo: EF 50-55%, no rwma, Gr1 DD, nl LA size, nl RV fxn.  . Diverticulosis of colon (without mention of hemorrhage)   . Hematochezia   . 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 Morbid obesity ([place])   . Non-obstructive CAD (coronary artery disease)    a. 12/2012 MV: EF 71%, no ischemia; b. 06/2016 Cath: LM nl, LAD 50p, LCX nl, RCA 77m, EF 55-65%.  . OSA (obstructive sleep apnea)   . Osteoarthritis    a. s/p bilat hip replacements.  Marland Kitchen UTI (urinary tract infection)    a. 09/2017 admit for E coli & Klebsiella UTI.  . Ventral hernia     Past Surgical History:  Procedure Laterality Date  . APPENDECTOMY    . BUNIONECTOMY Bilateral   . COSMETIC SURGERY  1970's   abdomen. Tummy Tuck  . FOOT SURGERY Bilateral dec 2015 right    left 3 yrs ago  . RIGHT/LEFT HEART CATH AND CORONARY ANGIOGRAPHY Bilateral 07/08/2016   Procedure: Right/Left Heart Cath and Coronary Angiography;  Surgeon: Minna Merritts, MD;  Location: [facility];  Service: Cardiovascular;  Laterality: Bilateral;  .  TOTAL HIP ARTHROPLASTY  2007   left  . TOTAL HIP ARTHROPLASTY     bilateral   . TOTAL KNEE ARTHROPLASTY Right 08/08/2014   Procedure: RIGHT TOTAL KNEE ARTHROPLASTY WITH NAVIGATION;  Surgeon: Rod Can, MD;  Location: [facility];  Service: Orthopedics;  Laterality: Right;  . TOTAL KNEE ARTHROPLASTY Left 10/03/2014   Procedure: LEFT TOTAL KNEE ARTHROPLASTY;  Surgeon: Rod Can, MD;  Location: [facility];  Service: Orthopedics;  Laterality: Left;  . TUBAL LIGATION    . UMBILICAL HERNIA REPAIR     2016     reports that she quit smoking about 18 years ago. Her smoking use included cigarettes. She has a 20.00  pack-year smoking history. She has never used smokeless tobacco. She reports that she does not drink alcohol or use drugs.  No Known Allergies  Family History  Problem Relation Age of Onset  . Diabetes Mother   . Coronary artery disease Mother   . Lung disease Mother   . Heart disease Mother        CAD  . Coronary artery disease Father   . Kidney disease Father   . Diabetes Father   . Heart disease Father        CAD  . Cancer Neg Hx      Prior to Admission medications   Medication Sig Start Date End Date Taking? Authorizing Provider  albuterol (PROVENTIL HFA;VENTOLIN HFA) 108 (90 Base) MCG/ACT inhaler Inhale 2 puffs into the lungs every 6 (six) hours as needed for wheezing or shortness of breath. 09/28/17   Alfred Levins, [place], MD  budesonide-formoterol [facility]) 160-4.5 MCG/ACT inhaler TAKE 2 PUFFS INTO LUNGS TWICE A DAY 05/28/19   Bedsole, Amy E, MD  cephALEXin (KEFLEX) 500 MG capsule Take 1 capsule (500 mg total) by mouth 3 (three) times daily. 05/28/19   Bedsole, Amy E, MD  diltiazem (CARDIZEM CD) 180 MG 24 hr capsule Take 1 capsule (180 mg total) by mouth daily. 03/05/19   Minna Merritts, MD  donepezil (ARICEPT) 5 MG tablet Take 1 tablet by mouth daily. 05/08/19 05/07/20  [provider]  furosemide (LASIX) 20 MG tablet Take 1 tablet (20 mg total) by mouth daily as needed. 03/05/19   Minna Merritts, MD  ibuprofen (ADVIL,MOTRIN) 200 MG tablet Take 600 mg by mouth every 6 (six) hours as needed for headache or moderate pain.     [provider]  ipratropium-albuterol (DUONEB) 0.5-2.5 (3) MG/3ML SOLN Take 3 mLs by nebulization 3 (three) times daily. 11/28/17   Juanito Doom, MD  Melatonin 10 MG TABS Take 15 mg by mouth at bedtime.    [provider]  OXYGEN Inhale 2 L into the lungs daily.    [provider]  rOPINIRole (REQUIP) 3 MG tablet Take 1 tablet (3 mg total) by mouth at bedtime. 04/13/18   Bedsole, Amy E, MD  rosuvastatin (CRESTOR) 5  MG tablet Take 1 tablet (5 mg total) by mouth daily. 03/05/19   Minna Merritts, MD  tiotropium (SPIRIVA) 18 MCG inhalation capsule Place 1 capsule (18 mcg total) into inhaler and inhale daily. 05/28/19   Bedsole, Amy E, MD  traZODone (DESYREL) 50 MG tablet TAKE 1 & 1/2 TABLETS BY MOUTH AT BEDTIMEAS NEEDED FOR SLEEP 04/13/18   Bedsole, Amy E, MD  ZOLOFT 25 MG tablet Take 1 tablet by mouth daily. 04/10/19   [provider]    Physical Exam: Vitals:   06/01/19 1912 06/01/19 1915 06/01/19 1918 06/01/19 2100  BP:  139/90    Pulse:  80    Resp:  (!) 28  (!) 25  SpO2: (!) 86% 97%    Weight:   104.3 kg   Height:   5\' 4"  (1.626 m)      Vitals:   06/01/19 1912 06/01/19 1915 06/01/19 1918 06/01/19 2100  BP:  139/90    Pulse:  80    Resp:  (!) 28  (!) 25  SpO2: (!) 86% 97%    Weight:   104.3 kg   Height:   5\' 4"  (1.626 m)     Constitutional: NAD, alert and oriented x 2 Eyes: PERRL, lids and conjunctivae normal ENMT: Mucous membranes are moist.  Neck: normal, supple, no masses, no thyromegaly Respiratory: clear to auscultation bilaterally, no wheezing, no crackles. Normal respiratory effort. No accessory muscle use.  Cardiovascular: Regular rate and rhythm, no murmurs / rubs / gallops. No extremity edema. 2+ pedal pulses. No carotid bruits.  Abdomen: no tenderness, no masses palpated. No hepatosplenomegaly. Bowel sounds positive.  Musculoskeletal: no clubbing / cyanosis.  joint deformity hands and feet Skin: no rashes, lesions, ulcers.  Neurologic: No gross focal neurologic deficit. Psychiatric: Normal mood and affect.   Labs on Admission: I have personally reviewed following labs and imaging studies  CBC: Recent Labs  Lab 06/01/19 1939  WBC 6.3  HGB 14.1  HCT 43.4  MCV 90.2  PLT [id number]   Basic Metabolic Panel: Recent Labs  Lab 06/01/19 1939  NA 137  K 3.6  CL 100  CO2 25  GLUCOSE 93  BUN 17  CREATININE 0.76  CALCIUM 8.6*   GFR: Estimated Creatinine  Clearance: 69.3 mL/min (by C-G formula based on SCr of 0.76 mg/dL). Liver Function Tests: Recent Labs  Lab 06/01/19 1939  AST 23  ALT 17  ALKPHOS 89  BILITOT 0.9  PROT 7.6  ALBUMIN 3.8   No results for input(s): LIPASE, AMYLASE in the last 168 hours. No results for input(s): AMMONIA in the last 168 hours. Coagulation Profile: No results for input(s): INR, PROTIME in the last 168 hours. Cardiac Enzymes: No results for input(s): CKTOTAL, CKMB, CKMBINDEX, TROPONINI in the last 168 hours. BNP (last 3 results) No results for input(s): PROBNP in the last 8760 hours. HbA1C: No results for input(s): HGBA1C in the last 72 hours. CBG: No results for input(s): GLUCAP in the last 168 hours. Lipid Profile: No results for input(s): CHOL, HDL, LDLCALC, TRIG, CHOLHDL, LDLDIRECT in the last 72 hours. Thyroid Function Tests: No results for input(s): TSH, T4TOTAL, FREET4, T3FREE, THYROIDAB in the last 72 hours. Anemia Panel: No results for input(s): VITAMINB12, FOLATE, FERRITIN, TIBC, IRON, RETICCTPCT in the last 72 hours. Urine analysis:    Component Value Date/Time   COLORURINE AMBER (A) 06/01/2019 1939   APPEARANCEUR HAZY (A) 06/01/2019 1939   LABSPEC 1.021 06/01/2019 1939   PHURINE 5.0 06/01/2019 1939   GLUCOSEU NEGATIVE 06/01/2019 1939   HGBUR NEGATIVE 06/01/2019 1939   HGBUR small 03/25/2009 1510   BILIRUBINUR NEGATIVE 06/01/2019 1939   BILIRUBINUR Negative 05/28/2019 0928   KETONESUR NEGATIVE 06/01/2019 1939   PROTEINUR 30 (A) 06/01/2019 1939   UROBILINOGEN 1.0 05/28/2019 0928   UROBILINOGEN 1.0 09/26/2014 1035   NITRITE POSITIVE (A) 06/01/2019 1939   LEUKOCYTESUR TRACE (A) 06/01/2019 1939    Radiological Exams on Admission: CT Head Wo Contrast  Result Date: 06/01/2019 CLINICAL DATA:  Fall x2 today.  Altered mental status. EXAM: CT HEAD WITHOUT CONTRAST CT CERVICAL SPINE WITHOUT CONTRAST  TECHNIQUE: Multidetector CT imaging of the head and cervical spine was performed following  the standard protocol without intravenous contrast. Multiplanar CT image reconstructions of the cervical spine were also generated. COMPARISON:  None. FINDINGS: CT HEAD FINDINGS Brain: No evidence of parenchymal hemorrhage or extra-axial fluid collection. No mass lesion, mass effect, or midline shift. No CT evidence of acute infarction. Generalized cerebral volume loss. Nonspecific prominent subcortical and periventricular white matter hypodensity, most in keeping with chronic small vessel ischemic change. No ventriculomegaly. Vascular: No acute abnormality. Skull: No evidence of calvarial fracture. Sinuses/Orbits: No fluid levels. Mild mucoperiosteal thickening throughout the paranasal sinuses bilaterally. Other: Mild partial bilateral mastoid effusions. CT CERVICAL SPINE FINDINGS Alignment: Straightening of the cervical spine. No facet subluxation. Dens is well positioned between the lateral masses of C1. Minimal 2 mm anterolisthesis at C5-6. Skull base and vertebrae: No acute fracture. No primary bone lesion or focal pathologic process. Soft tissues and spinal canal: No prevertebral edema. No visible canal hematoma. Disc levels: Marked multilevel degenerative disc disease throughout the cervical spine, most prominent at C4-5 and C5-6. Moderate bilateral cervical facet arthropathy. No significant degenerative foraminal stenosis. Upper chest: No acute abnormality. Other: No discrete thyroid nodules. No pathologically enlarged cervical nodes. IMPRESSION: 1. No evidence of acute intracranial abnormality. No evidence of calvarial fracture. 2. Generalized cerebral volume loss and prominent chronic small vessel ischemic changes in the cerebral white matter. 3. No cervical spine fracture or facet subluxation. 4. Advanced multilevel degenerative changes in the cervical spine as detailed. Electronically Signed   By: Ilona Sorrel M.D.   On: 06/01/2019 20:34   CT Cervical Spine Wo Contrast  Result Date:  06/01/2019 CLINICAL DATA:  Fall x2 today.  Altered mental status. EXAM: CT HEAD WITHOUT CONTRAST CT CERVICAL SPINE WITHOUT CONTRAST TECHNIQUE: Multidetector CT imaging of the head and cervical spine was performed following the standard protocol without intravenous contrast. Multiplanar CT image reconstructions of the cervical spine were also generated. COMPARISON:  None. FINDINGS: CT HEAD FINDINGS Brain: No evidence of parenchymal hemorrhage or extra-axial fluid collection. No mass lesion, mass effect, or midline shift. No CT evidence of acute infarction. Generalized cerebral volume loss. Nonspecific prominent subcortical and periventricular white matter hypodensity, most in keeping with chronic small vessel ischemic change. No ventriculomegaly. Vascular: No acute abnormality. Skull: No evidence of calvarial fracture. Sinuses/Orbits: No fluid levels. Mild mucoperiosteal thickening throughout the paranasal sinuses bilaterally. Other: Mild partial bilateral mastoid effusions. CT CERVICAL SPINE FINDINGS Alignment: Straightening of the cervical spine. No facet subluxation. Dens is well positioned between the lateral masses of C1. Minimal 2 mm anterolisthesis at C5-6. Skull base and vertebrae: No acute fracture. No primary bone lesion or focal pathologic process. Soft tissues and spinal canal: No prevertebral edema. No visible canal hematoma. Disc levels: Marked multilevel degenerative disc disease throughout the cervical spine, most prominent at C4-5 and C5-6. Moderate bilateral cervical facet arthropathy. No significant degenerative foraminal stenosis. Upper chest: No acute abnormality. Other: No discrete thyroid nodules. No pathologically enlarged cervical nodes. IMPRESSION: 1. No evidence of acute intracranial abnormality. No evidence of calvarial fracture. 2. Generalized cerebral volume loss and prominent chronic small vessel ischemic changes in the cerebral white matter. 3. No cervical spine fracture or facet  subluxation. 4. Advanced multilevel degenerative changes in the cervical spine as detailed. Electronically Signed   By: Ilona Sorrel M.D.   On: 06/01/2019 20:34   DG Chest Portable 1 View  Result Date: 06/01/2019 CLINICAL DATA:  Dyspnea, fall EXAM: PORTABLE CHEST 1  VIEW COMPARISON:  09/30/2017 chest radiograph. FINDINGS: Stable cardiomediastinal silhouette with top-normal heart size. No pneumothorax. No pleural effusion. Slightly low lung volumes. No overt pulmonary edema. No acute consolidative airspace disease. No displaced fractures in the visualized chest. IMPRESSION: Slightly low lung volumes.  No active cardiopulmonary disease. Electronically Signed   By: Ilona Sorrel M.D.   On: 06/01/2019 20:52    EKG: Independently reviewed.   Assessment/Plan    COPD with acute exacerbation (HCC) -Scheduled and as needed bronchodilator treatment -IV methylprednisolone to transition to oral prednisone -Supplemental oxygen to keep sats over 90%    Acute on chronic respiratory failure with hypoxia ([place]) -Patient has a history of emphysema and interstitial lung disease and has been seen by pulmonology in the past --Current O2 requirement is 4 L which is above her baseline requirement -Continue to wean oxygen back to home flow rate as she improves and as tolerated    UTI (urinary tract infection) -Urinalysis consistent with UTI -Follow cultures -IV Rocephin    Acute metabolic encephalopathy -Has a history of dementia but was more confused from baseline, likely related to acute illness as outlined above -Fall and aspiration precaution -Neurologic checks  Fall in the setting of history of frequent falls -No evidence of acute injury on CT head and CT C-spine -Suspect related to acute illness superimposed on baseline of chronic physical l deconditioning- dependent on motorized wheelchair . -echocardiogram, serial troponins, carotid Doppler -Physical therapy evaluation    Coronary artery disease  involving native coronary artery of native heart  -No complaints of chest pain and EKG was nonacute Cycle troponins -Continue home rosuvastatin.  Not currently on antiplatelets or beta-blockers, reason uncertain    Mixed Alzheimer's and vascular dementia ([place]) -No acute behavioral changes -Increase nursing support        DVT prophylaxis: lovenox  Code Status: full  Family Communication: Daughter, Naval architect  Disposition Plan: Back to previous home environment Consults called: none     Athena Masse MD [facility]     06/01/2019, 9:53 PM

## 2019-06-02 ENCOUNTER — Inpatient Hospital Stay: Payer: Medicare HMO

## 2019-06-02 LAB — SARS CORONAVIRUS 2 (TAT 6-24 HRS): SARS Coronavirus 2: POSITIVE — AB

## 2019-06-02 LAB — HIV ANTIBODY (ROUTINE TESTING W REFLEX): HIV Screen 4th Generation wRfx: NONREACTIVE

## 2019-06-02 LAB — PROCALCITONIN: Procalcitonin: <0.1 ng/mL

## 2019-06-02 LAB — FIBRIN DERIVATIVES D-DIMER (ARMC ONLY): Fibrin derivatives D-dimer (ARMC): 1573.65 ng/mL (FEU) — ABNORMAL HIGH (ref 0.00–499.00)

## 2019-06-02 LAB — FIBRINOGEN: Fibrinogen: 567 mg/dL — ABNORMAL HIGH (ref 210–475)

## 2019-06-02 LAB — TROPONIN I (HIGH SENSITIVITY): Troponin I (High Sensitivity): 10 ng/L (ref <18)

## 2019-06-02 LAB — BRAIN NATRIURETIC PEPTIDE: B Natriuretic Peptide: 89 pg/mL (ref 0.0–100.0)

## 2019-06-02 LAB — ABO/RH: ABO/RH(D): O POS

## 2019-06-02 LAB — C-REACTIVE PROTEIN: CRP: 2.9 mg/dL — ABNORMAL HIGH (ref <1.0)

## 2019-06-02 LAB — FERRITIN: Ferritin: 171 ng/mL (ref 11–307)

## 2019-06-02 MED ORDER — METHYLPREDNISOLONE SODIUM SUCC 40 MG IJ SOLR
40.0000 mg | Freq: Once | INTRAMUSCULAR | Status: AC
  Administered 2019-06-02: 18:00:00 40 mg via INTRAVENOUS
  Filled 2019-06-02: qty 1

## 2019-06-02 MED ORDER — ZINC SULFATE 220 (50 ZN) MG PO CAPS
220.0000 mg | ORAL_CAPSULE | Freq: Every day | ORAL | Status: DC
  Administered 2019-06-02 – 2019-06-05 (×4): 220 mg via ORAL
  Filled 2019-06-02 (×4): qty 1

## 2019-06-02 MED ORDER — GUAIFENESIN-DM 100-10 MG/5ML PO SYRP: 10.0000 mL | ORAL_SOLUTION | ORAL | Status: DC | PRN

## 2019-06-02 MED ORDER — DONEPEZIL HCL 5 MG PO TABS
5.0000 mg | ORAL_TABLET | Freq: Every day | ORAL | Status: DC
  Administered 2019-06-02 – 2019-06-05 (×4): 5 mg via ORAL
  Filled 2019-06-02 (×4): qty 1

## 2019-06-02 MED ORDER — SODIUM CHLORIDE 0.9 % IV SOLN
100.0000 mg | Freq: Every day | INTRAVENOUS | Status: DC
  Administered 2019-06-03 – 2019-06-05 (×3): 100 mg via INTRAVENOUS
  Filled 2019-06-02 (×4): qty 20

## 2019-06-02 MED ORDER — ROSUVASTATIN CALCIUM 10 MG PO TABS
5.0000 mg | ORAL_TABLET | Freq: Every day | ORAL | Status: DC
  Administered 2019-06-02 – 2019-06-05 (×4): 5 mg via ORAL
  Filled 2019-06-02 (×4): qty 1

## 2019-06-02 MED ORDER — DILTIAZEM HCL ER COATED BEADS 180 MG PO CP24
180.0000 mg | ORAL_CAPSULE | Freq: Every day | ORAL | Status: DC
  Administered 2019-06-03 – 2019-06-05 (×3): 180 mg via ORAL
  Filled 2019-06-02 (×3): qty 1

## 2019-06-02 MED ORDER — ASCORBIC ACID 500 MG PO TABS
500.0000 mg | ORAL_TABLET | Freq: Every day | ORAL | Status: DC
  Administered 2019-06-02 – 2019-06-05 (×4): 500 mg via ORAL
  Filled 2019-06-02 (×4): qty 1

## 2019-06-02 MED ORDER — HYDROCOD POLST-CPM POLST ER 10-8 MG/5ML PO SUER: 5.0000 mL | Freq: Two times a day (BID) | ORAL | Status: DC | PRN

## 2019-06-02 MED ORDER — SERTRALINE HCL 50 MG PO TABS
25.0000 mg | ORAL_TABLET | Freq: Every day | ORAL | Status: DC
  Administered 2019-06-02 – 2019-06-05 (×4): 25 mg via ORAL
  Filled 2019-06-02 (×4): qty 1

## 2019-06-02 MED ORDER — TRAZODONE HCL 50 MG PO TABS
75.0000 mg | ORAL_TABLET | Freq: Every evening | ORAL | Status: DC | PRN
  Administered 2019-06-02 – 2019-06-04 (×2): 75 mg via ORAL
  Filled 2019-06-02 (×3): qty 2

## 2019-06-02 MED ORDER — IPRATROPIUM-ALBUTEROL 20-100 MCG/ACT IN AERS
1.0000 | INHALATION_SPRAY | Freq: Four times a day (QID) | RESPIRATORY_TRACT | Status: DC
  Administered 2019-06-02 – 2019-06-05 (×10): 1 via RESPIRATORY_TRACT
  Filled 2019-06-02: qty 4

## 2019-06-02 MED ORDER — DEXAMETHASONE 4 MG PO TABS
6.0000 mg | ORAL_TABLET | Freq: Every day | ORAL | Status: DC
  Administered 2019-06-03 – 2019-06-05 (×3): 6 mg via ORAL
  Filled 2019-06-02 (×3): qty 2

## 2019-06-02 MED ORDER — SODIUM CHLORIDE 0.9 % IV SOLN
200.0000 mg | Freq: Once | INTRAVENOUS | Status: AC
  Administered 2019-06-02: 20:00:00 200 mg via INTRAVENOUS
  Filled 2019-06-02: qty 200

## 2019-06-02 NOTE — Progress Notes (Signed)
Notified pt's daughter, Sherrill Raring that pt is Covid positive and transferred to room 125.  Daughter calm and understanding.

## 2019-06-02 NOTE — Progress Notes (Addendum)
Patient called 911 requesting to be taken out of hospital. Daughter [name] made aware.

## 2019-06-02 NOTE — Progress Notes (Addendum)
Pt is positive for Covid.  Dr Reesa Chew notified.  Airborne and contact precautions initiated.  Awaiting transfer to room 125.

## 2019-06-02 NOTE — Progress Notes (Addendum)
PROGRESS NOTE    Susan [name]  [id number] DOB: 01-21-1941 DOA: 06/01/2019 PCP: [name], MD   Brief Narrative:  Susan [name] is a 79 y.o. female with medical history significant for COPD on home O2 at 2 L, hypertension, dementia, peptic ulcer disease, disabling arthritis, dependent on motorized wheelchair, as well as history of falls in the past but who lives alone at home independently, who was brought into the emergency room because of confusion and somnolence noticed by her daughters who check in on her.She sustained a fall on the day of arrival. On arrival she was hypoxic, requiring 4 L, no radiologic evidence of any fracture, UA concerning for infection so she was started on ceftriaxone, also wheezing-admitted for COPD exacerbation. COVID-19 testing came back positive later today.  Subjective: Patient was feeling better when seen this morning.  She was little tachypneic but saturating well on room air.  According to patient she uses home oxygen intermittently.  She denies any shortness of breath.  Assessment & Plan:   Principal Problem:   COPD with acute exacerbation ([place]) Active Problems:   Acute on chronic respiratory failure with hypoxia (HCC)   Coronary artery disease involving native coronary artery of native heart with unstable angina pectoris (HCC)   Mixed Alzheimer's and vascular dementia ([place])   UTI (urinary tract infection)   Acute metabolic encephalopathy   Frequent falls  COPD with acute exacerbation ([place]).  She was saturating in mid 90s on room air this morning.  Appears slightly tachypneic but denies any shortness of breath.  No wheezing.  She does not use home oxygen all the time. -Continue breathing treatment. -Continue with prednisone for total of 5 days. -Supplemental oxygen as needed to keep saturation above 90%. -COVID-19 testing still pending.  Covid 19 infection.  Patient is high risk due to her underlying conditions.  No radiologic evidence  of pneumonia at this time. -She is already getting prednisone for COPD exacerbation. -I will check inflammatory markers and start her on remdesivir.  UTI.  UA concerning for UTI.  Cultures pending. Prior cultures with pansensitive E. Coli. -Continue ceftriaxone for now-we will narrow down according to culture results.  Acute metabolic encephalopathy.  Resolved, back to baseline now. -Continue fall precautions.  Fall in the setting of history of frequent falls -No evidence of acute injury on CT head and CT C-spine -Suspect related to acute illness superimposed on baseline of chronic physical l deconditioning- dependent on motorized wheelchair . -PT/OT evaluation.  Hypertension.  Normotensive this morning. -Continue home dose of Cardizem.  CAD.  No chest pain and EKG without any acute change. Troponin remain negative x2. -Continue home dose of statin.  Mixed Alzheimer's and vascular dementia ([place]). -Continue home dose of donepezil and Zoloft.  Objective: Vitals:   06/02/19 0100 06/02/19 0432 06/02/19 0526 06/02/19 1024  BP: 121/88 130/68 (!) 144/82 126/74  Pulse: (!) 105 79 63 86  Resp: (!) 27 (!) 22 18 (!) 23  Temp:  97.6 F (36.4 C) 98 F (36.7 C) 98.3 F (36.8 C)  TempSrc:  Oral Oral Oral  SpO2: 91% 93% 93% 95%  Weight:      Height:        Intake/Output Summary (Last 24 hours) at 06/02/2019 1321 Last data filed at 06/02/2019 1030 Gross per 24 hour  Intake 570 ml  Output 50 ml  Net 520 ml   Filed Weights   06/01/19 1918  Weight: 104.3 kg    Examination:  General  exam: Appears calm and comfortable  Respiratory system: Clear to auscultation. Respiratory effort normal. Cardiovascular system: S1 & S2 heard, RRR. No JVD, murmurs, rubs, gallops or clicks. Gastrointestinal system: Soft, nontender, nondistended, bowel sounds positive. Central nervous system: Alert and oriented. No focal neurological deficits.Symmetric 5 x 5 power. Extremities: No edema, no cyanosis,  pulses intact and symmetrical. Skin: No rashes, lesions or ulcers Psychiatry: Judgement and insight appear normal.    DVT prophylaxis: Lovenox Code Status: Full Family Communication: No family at bedside, talked with [name] and updated her, they want mom to back home with home health not to SNF, [facility] message for [facility]. Disposition Plan: Pending PT/OT evaluation, most likely tomorrow.  Consultants:   None  Procedures:  Antimicrobials:  Ceftriaxone  Data Reviewed: I have personally reviewed following labs and imaging studies  CBC: Recent Labs  Lab 06/01/19 1939  WBC 6.3  HGB 14.1  HCT 43.4  MCV 90.2  PLT [id number]   Basic Metabolic Panel: Recent Labs  Lab 06/01/19 1939  NA 137  K 3.6  CL 100  CO2 25  GLUCOSE 93  BUN 17  CREATININE 0.76  CALCIUM 8.6*   GFR: Estimated Creatinine Clearance: 69.3 mL/min (by C-G formula based on SCr of 0.76 mg/dL). Liver Function Tests: Recent Labs  Lab 06/01/19 1939  AST 23  ALT 17  ALKPHOS 89  BILITOT 0.9  PROT 7.6  ALBUMIN 3.8   No results for input(s): LIPASE, AMYLASE in the last 168 hours. No results for input(s): AMMONIA in the last 168 hours. Coagulation Profile: No results for input(s): INR, PROTIME in the last 168 hours. Cardiac Enzymes: No results for input(s): CKTOTAL, CKMB, CKMBINDEX, TROPONINI in the last 168 hours. BNP (last 3 results) No results for input(s): PROBNP in the last 8760 hours. HbA1C: No results for input(s): HGBA1C in the last 72 hours. CBG: No results for input(s): GLUCAP in the last 168 hours. Lipid Profile: No results for input(s): CHOL, HDL, LDLCALC, TRIG, CHOLHDL, LDLDIRECT in the last 72 hours. Thyroid Function Tests: No results for input(s): TSH, T4TOTAL, FREET4, T3FREE, THYROIDAB in the last 72 hours. Anemia Panel: No results for input(s): VITAMINB12, FOLATE, FERRITIN, TIBC, IRON, RETICCTPCT in the last 72 hours. Sepsis Labs: Recent Labs  Lab 06/01/19 1939  LATICACIDVEN 1.8     Recent Results (from the past 240 hour(s))  Urine culture     Status: None   Collection Time: 05/28/19  9:40 AM   Specimen: Urine  Result Value Ref Range Status   MICRO NUMBER: [id number]  Final   SPECIMEN QUALITY: Adequate  Final   Sample Source URINE  Final   STATUS: FINAL  Final   ISOLATE 1:   Final    Growth of mixed flora was isolated, suggesting probable contamination. No further testing will be performed. If clinically indicated, recollection using a method to minimize contamination, with prompt transfer to Urine Culture Transport Tube, is  recommended.   Blood culture (routine x 2)     Status: None (Preliminary result)   Collection Time: 06/01/19  7:39 PM   Specimen: BLOOD  Result Value Ref Range Status   Specimen Description BLOOD RIGHT ANTECUBITAL  Final   Special Requests   Final    BOTTLES DRAWN AEROBIC AND ANAEROBIC Blood Culture adequate volume   Culture   Final    NO GROWTH < 12 HOURS Performed at [facility], [address] [place], [place] [address]    Report Status PENDING  Incomplete  Radiology Studies: CT Head Wo Contrast  Result Date: 06/01/2019 CLINICAL DATA:  Fall x2 today.  Altered mental status. EXAM: CT HEAD WITHOUT CONTRAST CT CERVICAL SPINE WITHOUT CONTRAST TECHNIQUE: Multidetector CT imaging of the head and cervical spine was performed following the standard protocol without intravenous contrast. Multiplanar CT image reconstructions of the cervical spine were also generated. COMPARISON:  None. FINDINGS: CT HEAD FINDINGS Brain: No evidence of parenchymal hemorrhage or extra-axial fluid collection. No mass lesion, mass effect, or midline shift. No CT evidence of acute infarction. Generalized cerebral volume loss. Nonspecific prominent subcortical and periventricular white matter hypodensity, most in keeping with chronic small vessel ischemic change. No ventriculomegaly. Vascular: No acute abnormality. Skull: No evidence of calvarial fracture.  Sinuses/Orbits: No fluid levels. Mild mucoperiosteal thickening throughout the paranasal sinuses bilaterally. Other: Mild partial bilateral mastoid effusions. CT CERVICAL SPINE FINDINGS Alignment: Straightening of the cervical spine. No facet subluxation. Dens is well positioned between the lateral masses of C1. Minimal 2 mm anterolisthesis at C5-6. Skull base and vertebrae: No acute fracture. No primary bone lesion or focal pathologic process. Soft tissues and spinal canal: No prevertebral edema. No visible canal hematoma. Disc levels: Marked multilevel degenerative disc disease throughout the cervical spine, most prominent at C4-5 and C5-6. Moderate bilateral cervical facet arthropathy. No significant degenerative foraminal stenosis. Upper chest: No acute abnormality. Other: No discrete thyroid nodules. No pathologically enlarged cervical nodes. IMPRESSION: 1. No evidence of acute intracranial abnormality. No evidence of calvarial fracture. 2. Generalized cerebral volume loss and prominent chronic small vessel ischemic changes in the cerebral white matter. 3. No cervical spine fracture or facet subluxation. 4. Advanced multilevel degenerative changes in the cervical spine as detailed. Electronically Signed   By: Ilona Sorrel M.D.   On: 06/01/2019 20:34   CT Cervical Spine Wo Contrast  Result Date: 06/01/2019 CLINICAL DATA:  Fall x2 today.  Altered mental status. EXAM: CT HEAD WITHOUT CONTRAST CT CERVICAL SPINE WITHOUT CONTRAST TECHNIQUE: Multidetector CT imaging of the head and cervical spine was performed following the standard protocol without intravenous contrast. Multiplanar CT image reconstructions of the cervical spine were also generated. COMPARISON:  None. FINDINGS: CT HEAD FINDINGS Brain: No evidence of parenchymal hemorrhage or extra-axial fluid collection. No mass lesion, mass effect, or midline shift. No CT evidence of acute infarction. Generalized cerebral volume loss. Nonspecific prominent  subcortical and periventricular white matter hypodensity, most in keeping with chronic small vessel ischemic change. No ventriculomegaly. Vascular: No acute abnormality. Skull: No evidence of calvarial fracture. Sinuses/Orbits: No fluid levels. Mild mucoperiosteal thickening throughout the paranasal sinuses bilaterally. Other: Mild partial bilateral mastoid effusions. CT CERVICAL SPINE FINDINGS Alignment: Straightening of the cervical spine. No facet subluxation. Dens is well positioned between the lateral masses of C1. Minimal 2 mm anterolisthesis at C5-6. Skull base and vertebrae: No acute fracture. No primary bone lesion or focal pathologic process. Soft tissues and spinal canal: No prevertebral edema. No visible canal hematoma. Disc levels: Marked multilevel degenerative disc disease throughout the cervical spine, most prominent at C4-5 and C5-6. Moderate bilateral cervical facet arthropathy. No significant degenerative foraminal stenosis. Upper chest: No acute abnormality. Other: No discrete thyroid nodules. No pathologically enlarged cervical nodes. IMPRESSION: 1. No evidence of acute intracranial abnormality. No evidence of calvarial fracture. 2. Generalized cerebral volume loss and prominent chronic small vessel ischemic changes in the cerebral white matter. 3. No cervical spine fracture or facet subluxation. 4. Advanced multilevel degenerative changes in the cervical spine as detailed. Electronically Signed   By:  Ilona Sorrel M.D.   On: 06/01/2019 20:34   US Carotid Bilateral  Result Date: 06/02/2019 CLINICAL DATA:  Altered mental status and fall. EXAM: BILATERAL CAROTID DUPLEX ULTRASOUND TECHNIQUE: Pearline Cables scale imaging, color Doppler and duplex ultrasound were performed of bilateral carotid and vertebral arteries in the neck. COMPARISON:  10/01/2017 FINDINGS: Criteria: Quantification of carotid stenosis is based on velocity parameters that correlate the residual internal carotid diameter with  NASCET-based stenosis levels, using the diameter of the distal internal carotid lumen as the denominator for stenosis measurement. The following velocity measurements were obtained: RIGHT ICA: 73/17 cm/sec CCA: 123456 cm/sec SYSTOLIC ICA/CCA RATIO:  1.0 ECA: 370 cm/sec LEFT ICA: 89/15 cm/sec CCA: 0000000 cm/sec SYSTOLIC ICA/CCA RATIO:  1.2 ECA: 202 cm/sec RIGHT CAROTID ARTERY: Elevated peak systolic velocity in the right external carotid artery. Small amount of plaque in the proximal internal and external carotid arteries. Normal waveforms and velocities in the internal carotid artery. Grayscale imaging is limited compared to the previous examination. RIGHT VERTEBRAL ARTERY: Antegrade flow and normal waveform in the right vertebral artery. LEFT CAROTID ARTERY: Left carotid arteries are patent without significant plaque. External carotid artery is patent with normal waveform. Normal waveforms and velocities in the internal carotid artery. LEFT VERTEBRAL ARTERY: Antegrade flow and normal waveform in the left vertebral artery. IMPRESSION: 1. Evidence for at least mild atherosclerotic disease in the carotid arteries although the grayscale imaging is limited on this examination compared to the previous examination. Estimated degree of stenosis in the internal carotid arteries is less than 50% bilaterally. Probable stenosis in the right external carotid artery. 2. Patent vertebral arteries with antegrade flow. Electronically Signed   By: Markus Daft M.D.   On: 06/02/2019 10:04   DG Chest Portable 1 View  Result Date: 06/01/2019 CLINICAL DATA:  Dyspnea, fall EXAM: PORTABLE CHEST 1 VIEW COMPARISON:  09/30/2017 chest radiograph. FINDINGS: Stable cardiomediastinal silhouette with top-normal heart size. No pneumothorax. No pleural effusion. Slightly low lung volumes. No overt pulmonary edema. No acute consolidative airspace disease. No displaced fractures in the visualized chest. IMPRESSION: Slightly low lung volumes.  No  active cardiopulmonary disease. Electronically Signed   By: Ilona Sorrel M.D.   On: 06/01/2019 20:52    Scheduled Meds: . donepezil  5 mg Oral Daily  . enoxaparin (LOVENOX) injection  40 mg Subcutaneous Q24H  . ipratropium-albuterol  3 mL Nebulization Q6H  . methylPREDNISolone (SOLU-MEDROL) injection  40 mg Intravenous Q6H   Followed by  . [START ON 06/03/2019] predniSONE  40 mg Oral Q breakfast  . rosuvastatin  5 mg Oral Daily  . sertraline  25 mg Oral Daily   Continuous Infusions: . sodium chloride 75 mL/hr at 06/02/19 1034  . cefTRIAXone (ROCEPHIN)  IV Stopped (06/01/19 2326)     LOS: 1 day   Time spent: 35 minutes.  Lorella Nimrod, MD [facility] Pager [phone]  If 7PM-7AM, please contact night-coverage [email or web address] Password [email or web address] 06/02/2019, 1:21 PM   This record has been created using Systems analyst. Errors have been sought and corrected,but may not always be located. Such creation errors do not reflect on the standard of care.

## 2019-06-02 NOTE — Evaluation (Signed)
Physical Therapy Evaluation Patient Details Name: Susan [name] MRN: [id number] DOB: July 01, 1940 Today's Date: 06/02/2019   History of Present Illness  Patient is 79 year old female admitted from home with SOB, actue exacerbation of COPD. Covid test pending. PMH includes: COPD, Home O2, HTN, RA, mixed alzheimer's and vascular dementia.  Clinical Impression  Patient received in bed, having difficulty hearing me with mask donned. She is visibly SOB during session, but agreeable to PT session. She just received breathing treatment. She performed bed mobility with mod independence, transfers with min guard and RW. She is able to ambulate to recliner with RW and min guard assist. Slightly impulsive with mobility. Decreased safety awareness. She will continue to benefit from skilled PT while here to improve strength and safety with mobility.        Follow Up Recommendations Home health PT;Supervision for mobility/OOB    Equipment Recommendations  None recommended by PT    Recommendations for Other Services       Precautions / Restrictions Precautions Precautions: Fall Restrictions Weight Bearing Restrictions: No      Mobility  Bed Mobility Overal bed mobility: Modified Independent             General bed mobility comments: used rail and was able to get sitting on side of bed without physical assist.  Transfers Overall transfer level: Needs assistance Equipment used: Rolling walker (2 wheeled) Transfers: Sit to/from Stand Sit to Stand: Min guard            Ambulation/Gait Ambulation/Gait assistance: Min guard Gait Distance (Feet): 4 Feet Assistive device: Rolling walker (2 wheeled) Gait Pattern/deviations: Step-to pattern;Shuffle Gait velocity: decreased   General Gait Details: ambulated 4 feet from bed to recliner. Min guard. Patient demonstrates increased work of breathing. Coughing during session occasionally.  Stairs            Wheelchair Mobility     Modified Rankin (Stroke Patients Only)       Balance Overall balance assessment: Needs assistance Sitting-balance support: Feet supported;Single extremity supported Sitting balance-Leahy Scale: Good     Standing balance support: Bilateral upper extremity supported;During functional activity Standing balance-Leahy Scale: Fair Standing balance comment: reliant on RW                             Pertinent Vitals/Pain Pain Assessment: No/denies pain    Home Living Family/patient expects to be discharged to:: Private residence Living Arrangements: Children Available Help at Discharge: Available PRN/intermittently Type of Home: House Home Access: Level entry     Home Layout: One level Home Equipment: Youth worker - 2 wheels      Prior Function Level of Independence: Independent with assistive device(s)         Comments: Patient is limited ambulator at baseline due to RA. She uses motorized scooter at home. Planning to be discharged to her daughter's home     Hand Dominance   Dominant Hand: Right    Extremity/Trunk Assessment   Upper Extremity Assessment Upper Extremity Assessment: Generalized weakness    Lower Extremity Assessment Lower Extremity Assessment: Generalized weakness       Communication   Communication: HOH  Cognition Arousal/Alertness: Awake/alert Behavior During Therapy: Anxious;WFL for tasks assessed/performed Overall Cognitive Status: No family/caregiver present to determine baseline cognitive functioning  General Comments      Exercises     Assessment/Plan    PT Assessment Patient needs continued PT services  PT Problem List Decreased strength;Decreased activity tolerance;Decreased mobility       PT Treatment Interventions Therapeutic exercise;Gait training;Functional mobility training;Therapeutic activities;Patient/family education    PT Goals  (Current goals can be found in the Care Plan section)  Acute Rehab PT Goals Patient Stated Goal: to go home to daughter's house PT Goal Formulation: With patient Time For Goal Achievement: 06/16/19 Potential to Achieve Goals: Fair    Frequency Min 2X/week   Barriers to discharge        Co-evaluation               AM-PAC PT "6 Clicks" Mobility  Outcome Measure Help needed turning from your back to your side while in a flat bed without using bedrails?: None Help needed moving from lying on your back to sitting on the side of a flat bed without using bedrails?: A Little Help needed moving to and from a bed to a chair (including a wheelchair)?: A Little Help needed standing up from a chair using your arms (e.g., wheelchair or bedside chair)?: A Little Help needed to walk in hospital room?: A Lot Help needed climbing 3-5 steps with a railing? : A Lot 6 Click Score: 17    End of Session Equipment Utilized During Treatment: Gait belt Activity Tolerance: Other (comment)(limited by increased work of breathing during activity) Patient left: in chair;with call bell/phone within reach;with chair alarm set Nurse Communication: Mobility status PT Visit Diagnosis: Other abnormalities of gait and mobility (R26.89);Muscle weakness (generalized) (M62.81)    Time: 1400-1420 PT Time Calculation (min) (ACUTE ONLY): 20 min   Charges:   PT Evaluation $PT Eval Moderate Complexity: 1 Mod PT Treatments $Therapeutic Activity: 8-22 mins        Rian Busche, PT, GCS 06/02/19,2:28 PM

## 2019-06-03 DIAGNOSIS — I25110 Atherosclerotic heart disease of native coronary artery with unstable angina pectoris: Secondary | ICD-10-CM

## 2019-06-03 DIAGNOSIS — Z515 Encounter for palliative care: Secondary | ICD-10-CM

## 2019-06-03 DIAGNOSIS — G9341 Metabolic encephalopathy: Secondary | ICD-10-CM

## 2019-06-03 DIAGNOSIS — J9621 Acute and chronic respiratory failure with hypoxia: Secondary | ICD-10-CM

## 2019-06-03 DIAGNOSIS — Z7189 Other specified counseling: Secondary | ICD-10-CM

## 2019-06-03 LAB — COMPREHENSIVE METABOLIC PANEL
ALT: 22 U/L (ref 0–44)
AST: 23 U/L (ref 15–41)
Albumin: 3.4 g/dL — ABNORMAL LOW (ref 3.5–5.0)
Alkaline Phosphatase: 72 U/L (ref 38–126)
Anion gap: 12 (ref 5–15)
BUN: 21 mg/dL (ref 8–23)
CO2: 22 mmol/L (ref 22–32)
Calcium: 8.3 mg/dL — ABNORMAL LOW (ref 8.9–10.3)
Chloride: 107 mmol/L (ref 98–111)
Creatinine, Ser: 0.7 mg/dL (ref 0.44–1.00)
GFR calc Af Amer: >60 mL/min (ref >60)
GFR calc non Af Amer: >60 mL/min (ref >60)
Glucose, Bld: 110 mg/dL — ABNORMAL HIGH (ref 70–99)
Potassium: 3.6 mmol/L (ref 3.5–5.1)
Sodium: 141 mmol/L (ref 135–145)
Total Bilirubin: 0.6 mg/dL (ref 0.3–1.2)
Total Protein: 7.4 g/dL (ref 6.5–8.1)

## 2019-06-03 LAB — CBC WITH DIFFERENTIAL/PLATELET
Abs Immature Granulocytes: 0.05 10*3/uL (ref 0.00–0.07)
Basophils Absolute: 0 10*3/uL (ref 0.0–0.1)
Basophils Relative: 0 %
Eosinophils Absolute: 0 10*3/uL (ref 0.0–0.5)
Eosinophils Relative: 0 %
HCT: 43.9 % (ref 36.0–46.0)
Hemoglobin: 14.2 g/dL (ref 12.0–15.0)
Immature Granulocytes: 1 %
Lymphocytes Relative: 8 %
Lymphs Abs: 0.8 10*3/uL (ref 0.7–4.0)
MCH: 29.2 pg (ref 26.0–34.0)
MCHC: 32.3 g/dL (ref 30.0–36.0)
MCV: 90.3 fL (ref 80.0–100.0)
Monocytes Absolute: 0.9 10*3/uL (ref 0.1–1.0)
Monocytes Relative: 9 %
Neutro Abs: 8.5 10*3/uL — ABNORMAL HIGH (ref 1.7–7.7)
Neutrophils Relative %: 82 %
Platelets: 195 10*3/uL (ref 150–400)
RBC: 4.86 MIL/uL (ref 3.87–5.11)
RDW: 14.4 % (ref 11.5–15.5)
WBC: 10.2 10*3/uL (ref 4.0–10.5)
nRBC: 0 % (ref 0.0–0.2)

## 2019-06-03 LAB — C-REACTIVE PROTEIN: CRP: 1.6 mg/dL — ABNORMAL HIGH (ref <1.0)

## 2019-06-03 LAB — PHOSPHORUS: Phosphorus: 3.3 mg/dL (ref 2.5–4.6)

## 2019-06-03 LAB — MAGNESIUM: Magnesium: 2.3 mg/dL (ref 1.7–2.4)

## 2019-06-03 LAB — FIBRIN DERIVATIVES D-DIMER (ARMC ONLY): Fibrin derivatives D-dimer (ARMC): 1802.14 ng/mL (FEU) — ABNORMAL HIGH (ref 0.00–499.00)

## 2019-06-03 MED ORDER — HALOPERIDOL LACTATE 5 MG/ML IJ SOLN
1.0000 mg | Freq: Once | INTRAMUSCULAR | Status: AC
  Administered 2019-06-03: 1 mg via INTRAVENOUS
  Filled 2019-06-03: qty 1

## 2019-06-03 MED ORDER — ADULT MULTIVITAMIN W/MINERALS CH
1.0000 | ORAL_TABLET | Freq: Every day | ORAL | Status: DC
  Administered 2019-06-04 – 2019-06-05 (×2): 1 via ORAL
  Filled 2019-06-03 (×2): qty 1

## 2019-06-03 MED ORDER — ENSURE ENLIVE PO LIQD
237.0000 mL | Freq: Three times a day (TID) | ORAL | Status: DC
  Administered 2019-06-03 – 2019-06-05 (×6): 237 mL via ORAL

## 2019-06-03 MED ORDER — SODIUM CHLORIDE 0.9 % IV SOLN
INTRAVENOUS | Status: DC | PRN
  Administered 2019-06-03: 30 mL via INTRAVENOUS

## 2019-06-03 NOTE — Progress Notes (Signed)
OT Cancellation Note  Patient Details Name: Susan [name] MRN: [id number] DOB: 08-07-1940   Cancelled Treatment:    Reason Eval/Treat Not Completed: Other (comment). Consult received, chart reviewed. Per communication with nursing pt with increased confusion this morning and did not sleep last night and having just started remdesivir. Will re-attempt OT evaluation at later date/time as pt is medically appropriate and able to participate.   Jeni Salles, MPH, MS, OTR/L ascom [phone] 06/03/19, 12:54 PM

## 2019-06-03 NOTE — Progress Notes (Signed)
PT Cancellation Note  Patient Details Name: Susan [name] MRN: [id number] DOB: August 18, 1940   Cancelled Treatment:    Reason Eval/Treat Not Completed: Other (comment);Patient not medically ready. Per communication with nursing pt with increased confusion this morning and did not sleep last night and having just started remdesivir. Nursing recommending morning hold on PT treatment and follow up after lunch. PT will follow up at later date/time as able and pt appropriate.   Zachary George PT, DPT 10:32 AM,06/03/19 [phone]    Shizue Kaseman Drucilla Chalet 06/03/2019, 10:31 AM

## 2019-06-03 NOTE — Progress Notes (Signed)
Called IV team. No answer.

## 2019-06-03 NOTE — Progress Notes (Signed)
Initial Nutrition Assessment  DOCUMENTATION CODES:   Obesity unspecified  INTERVENTION:   Ensure Enlive po BID, each supplement provides 350 kcal and 20 grams of protein  Magic cup TID with meals, each supplement provides 290 kcal and 9 grams of protein  MVI daily   Liberalize diet  NUTRITION DIAGNOSIS:   Increased nutrient needs related to catabolic illness(COPD, COVID 19) as evidenced by increased estimated needs.  GOAL:   Patient will meet greater than or equal to 90% of their needs  MONITOR:   PO intake, Supplement acceptance, Labs, Weight trends, Skin, I & O's  REASON FOR ASSESSMENT:   Consult Assessment of nutrition requirement/status  ASSESSMENT:   79 y.o. female with medical history significant for COPD on home O2 at 2 L, hypertension, dementia, peptic ulcer disease, disabling arthritis, dependent on motorized wheelchair, as well as history of falls in the past but who lives alone at home independently, who was brought into the emergency room because of confusion and somnolence and found to have COVID 19 and UTI   Unable to speak with pt r/t AMS. RD suspects pt with poor appetite and oral intake pta r/t COVID 19. RD will add supplements and vitamins to help pt meet her estimated needs. RD will also liberalize pt's diet as a heart healthy diet is restrictive of protein. Per chart, pt appears fairly weight stable at baseline. Palliative care following.   Medications reviewed and include: vitamin C, dexamethasone, lovenox, zinc, ceftriaxone  Labs reviewed: K 3.6 wnl, P 3.3 wnl, Mg 2.3 wnl  Unable to complete Nutrition-Focused physical exam at this time as pt with COVID 19.   Diet Order:   Diet Order            Diet Heart Room service appropriate? Yes; Fluid consistency: Thin  Diet effective now             EDUCATION NEEDS:   Not appropriate for education at this time  Skin:  Skin Assessment: Reviewed RN Assessment  Last BM:  1/11- TYPE 7  Height:    Ht Readings from Last 1 Encounters:  06/01/19 5\' 4"  (1.626 m)    Weight:   Wt Readings from Last 1 Encounters:  06/01/19 104.3 kg    Ideal Body Weight:  54.5 kg  BMI:  Body mass index is 39.48 kg/m.  Estimated Nutritional Needs:   Kcal:  2100-2400kcal/day  Protein:  105-120g/day  Fluid:  >1.6L/day  Koleen Distance MS, RD, LDN Pager #- [phone] Office#- [phone] After Hours Pager: [phone]

## 2019-06-03 NOTE — TOC Initial Note (Signed)
Transition of Care [facility]) - Initial/Assessment Note    Patient Details  Name: Susan [name] MRN: [id number] Date of Birth: [birth year]  Transition of Care [facility]) CM/SW Contact:    Shelbie Hutching, RN Phone Number: 06/03/2019, 4:19 PM  Clinical Narrative:                 Patient brought into the emergency department for falling and found to be COVID +.  Patient is from home where she has been living alone.  When discharged patient will return home but her 2 daughters will take turns staying with her.  Daughter, [name] reports that the patient will move in with her at the end of the month they just have to finish renovations on her room.  Patient is open with [facility] for home health RN and PT.  Jana Half with [facility] is aware of admission to the hospital.   Daughter denies need for any DME, she reports she has everything she needs at this time.   Patient has oxygen at home that she uses only as needed.   RNCM will cont to follow and assist with discharge needs.   Expected Discharge Plan: [place] Barriers to Discharge: Continued Medical Work up   Patient Goals and CMS Choice   CMS Medicare.gov Compare Post Acute Care list provided to:: Patient Represenative (must comment)(daughter [name]) Choice offered to / list presented to : [facility] POA / Guardian(Daughter [name])  Expected Discharge Plan and Services Expected Discharge Plan: [place]   Discharge Planning Services: CM Consult Post Acute Care Choice: [place], Resumption of Svcs/PTA Provider Living arrangements for the past 2 months: [place] Arranged: RN, PT [facility] Agency: [facility] Date [facility] Agency Contacted: 06/03/19 Time [place]: 1619 Representative spoke with at [place]: Jana Half  Prior Living Arrangements/Services Living arrangements for the past 2 months: [place] with:: Self Patient language  and need for interpreter reviewed:: Yes Do you feel safe going back to the place where you live?: Yes      Need for Family Participation in Patient Care: Yes (Comment)(dementia) Care giver support system in place?: Yes (comment)(2 daughters) Current home services: DME(scooter) Criminal Activity/Legal Involvement Pertinent to Current Situation/Hospitalization: No - Comment as needed  Activities of Daily Living Home Assistive Devices/Equipment: Environmental consultant (specify type) ADL Screening (condition at time of admission) Patient's cognitive ability adequate to safely complete daily activities?: Yes Is the patient deaf or have difficulty hearing?: Yes Does the patient have difficulty seeing, even when wearing glasses/contacts?: No Does the patient have difficulty concentrating, remembering, or making decisions?: Yes Patient able to express need for assistance with ADLs?: Yes Does the patient have difficulty dressing or bathing?: Yes Independently performs ADLs?: Yes (appropriate for developmental age) Does the patient have difficulty walking or climbing stairs?: Yes Weakness of Legs: Both Weakness of Arms/Hands: Both  Permission Sought/Granted Permission sought to share information with : Case Manager, Family Supports, Other (comment) Permission granted to share information with : Yes, Verbal Permission Granted     Permission granted to share info w AGENCY: [facility]  Permission granted to share info w Relationship: Daughter Actor     Emotional Assessment       Orientation: : Oriented to Self Alcohol / Substance Use: Not Applicable Psych Involvement:  No (comment)  Admission diagnosis:  Fall [W19.XXXA] Acute cystitis without hematuria [N30.00] COPD with acute exacerbation ([place]) [J44.1] Multiple falls [R29.6] Altered mental status, unspecified altered mental status type [R41.82] Patient Active Problem List   Diagnosis Date Noted  . UTI (urinary tract infection) 06/01/2019  . Acute  metabolic encephalopathy [id number]  . Frequent falls 06/01/2019  . Altered mental status 05/28/2019  . Mixed Alzheimer's and vascular dementia ([place]) 05/28/2019  . Seizures ([place]) 11/02/2017  . GAD (generalized anxiety disorder) 08/11/2017  . Hypoxia 08/11/2017  . Unstable angina ([place]) 07/08/2016  . Mixed hyperlipidemia   . Aortic atherosclerosis ([place]) 07/06/2016  . Coronary artery disease involving native coronary artery of native heart with unstable angina pectoris ([place]) 07/06/2016  . Acute on chronic respiratory failure with hypoxia ([place]) 04/18/2016  . Balance problem 10/08/2015  . Hx of urinary tract infection 06/23/2015  . Chronic insomnia 12/09/2014  . Primary osteoarthritis of left knee 10/03/2014  . Primary osteoarthritis of right knee 08/08/2014  . COPD with acute exacerbation ([place]) 10/22/2013  . Glenohumeral arthritis, Severe 08/20/2013  . Solitary pulmonary nodule 02/19/2013  . Shortness of breath 02/12/2013  . Obstructive sleep apnea 10/11/2012  . Umbilical hernia [id number]  . Urinary incontinence, urge 01/17/2012  . Diastolic dysfunction [id number]  . Preoperative cardiovascular examination 04/21/2011  . DDD (degenerative disc disease) 10/26/2010  . RESTLESS LEG SYNDROME 02/23/2010  . HIP REPLACEMENT, LEFT, HX OF 12/21/2009  . FOOT PAIN, BILATERAL 03/25/2009  . HYPERCHOLESTEROLEMIA 06/18/2008  . ALLERGIC RHINITIS 05/21/2008  . ANKLE PAIN, BILATERAL 05/21/2008  . DIVERTICULOSIS, COLON 11/26/2007  . 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

## 2019-06-03 NOTE — Progress Notes (Signed)
PROGRESS NOTE    Susan [name]  [id number] DOB: [birth year] DOA: 06/01/2019 PCP: [name], MD   Brief Narrative:  Susan [name] is a 79 y.o. female with medical history significant for COPD on home O2 at 2 L, hypertension, dementia, peptic ulcer disease, disabling arthritis, dependent on motorized wheelchair, as well as history of falls in the past but who lives alone at home independently, who was brought into the emergency room because of confusion and somnolence noticed by her daughters who check in on her.She sustained a fall on the day of arrival. On arrival she was hypoxic, requiring 4 L, no radiologic evidence of any fracture, UA concerning for infection so she was started on ceftriaxone, also wheezing-admitted for COPD exacerbation. COVID-19  positive  Subjective: Nurse reported as patient being agitated, pulling out IVs, trying to get out of bed, didn't sleep well last night.   Assessment & Plan:   Principal Problem:   COPD with acute exacerbation ([place]) Active Problems:   Acute on chronic respiratory failure with hypoxia (HCC)   Coronary artery disease involving native coronary artery of native heart with unstable angina pectoris (HCC)   Mixed Alzheimer's and vascular dementia ([place])   UTI (urinary tract infection)   Acute metabolic encephalopathy   Frequent falls  COPD with acute exacerbation ([place]).  Due to COVID 19 - on remdesivir day 2/5 - on decadron 6 mg po daily, Vit c & zinc daily - on RA  UTI. Urine c/s grew E. Coli. -Continue ceftriaxone for total 3 days  Acute metabolic encephalopathy.  Likely acute delirium  - tried haldol once, avoid benzo's -Continue fall precautions.  Fall in the setting of history of frequent falls -No evidence of acute injury on CT head and CT C-spine -Suspect related to acute illness superimposed on baseline of chronic physical l deconditioning- dependent on motorized wheelchair . -PT/OT recommends HHPT  Hypertension.   Normotensive this morning. -Continue home dose of Cardizem.  CAD.  No chest pain and EKG without any acute change. Troponin remain negative x2. -Continue home dose of statin.  Mixed Alzheimer's and vascular dementia ([place]). -Continue home dose of donepezil and Zoloft.  Objective: Vitals:   06/02/19 1505 06/02/19 1915 06/03/19 0739 06/03/19 1648  BP: (!) 116/58 119/78 (!) 122/98 (!) 134/58  Pulse: 94 75 65 (!) 48  Resp:  (!) 21  17  Temp: 97.6 F (36.4 C) 98.7 F (37.1 C) 98 F (36.7 C) 98.9 F (37.2 C)  TempSrc: Oral Oral Axillary Oral  SpO2: 93% 94% 96% 94%  Weight:      Height:        Intake/Output Summary (Last 24 hours) at 06/03/2019 1956 Last data filed at 06/03/2019 1422 Gross per 24 hour  Intake 239.68 ml  Output --  Net 239.68 ml   Filed Weights   06/01/19 1918  Weight: 104.3 kg    Examination:  General exam: Appears sleepy and comfortable Respiratory system: Clear to auscultation. Respiratory effort normal. Cardiovascular system: S1 & S2 heard, RRR. No JVD, murmurs, rubs, gallops or clicks. Gastrointestinal system: Soft, nontender, nondistended, bowel sounds positive. Central nervous system: sedated s/p haldol. No focal neurological deficits.Symmetric 5 x 5 power. Extremities: No edema, no cyanosis, pulses intact and symmetrical. Skin: No rashes, lesions or ulcers Psychiatry: unable to evaluate as she is sedated s/p haldol   DVT prophylaxis: Lovenox Code Status: Full Family Communication: No family at bedside, TOC talked with family, they want mom to back home with  home health not to SNF Disposition Plan: depending on clinical condition, mental status - HHPT  Consultants:   None  Procedures:  Antimicrobials:  Ceftriaxone  Data Reviewed: I have personally reviewed following labs and imaging studies  CBC: Recent Labs  Lab 06/01/19 1939 06/03/19 0655  WBC 6.3 10.2  NEUTROABS  --  8.5*  HGB 14.1 14.2  HCT 43.4 43.9  MCV 90.2 90.3  PLT 192  0000000   Basic Metabolic Panel: Recent Labs  Lab 06/01/19 1939 06/03/19 0655  NA 137 141  K 3.6 3.6  CL 100 107  CO2 25 22  GLUCOSE 93 110*  BUN 17 21  CREATININE 0.76 0.70  CALCIUM 8.6* 8.3*  MG  --  2.3  PHOS  --  3.3   GFR: Estimated Creatinine Clearance: 69.3 mL/min (by C-G formula based on SCr of 0.7 mg/dL). Liver Function Tests: Recent Labs  Lab 06/01/19 1939 06/03/19 0655  AST 23 23  ALT 17 22  ALKPHOS 89 72  BILITOT 0.9 0.6  PROT 7.6 7.4  ALBUMIN 3.8 3.4*   Anemia Panel: Recent Labs    06/02/19 1659  FERRITIN 171   Sepsis Labs: Recent Labs  Lab 06/01/19 1939 06/02/19 1659  PROCALCITON  --  <0.10  LATICACIDVEN 1.8  --     Recent Results (from the past 240 hour(s))  Urine culture     Status: None   Collection Time: 05/28/19  9:40 AM   Specimen: Urine  Result Value Ref Range Status   MICRO NUMBER: [id number]  Final   SPECIMEN QUALITY: Adequate  Final   Sample Source URINE  Final   STATUS: FINAL  Final   ISOLATE 1:   Final    Growth of mixed flora was isolated, suggesting probable contamination. No further testing will be performed. If clinically indicated, recollection using a method to minimize contamination, with prompt transfer to Urine Culture Transport Tube, is  recommended.   Blood culture (routine x 2)     Status: None (Preliminary result)   Collection Time: 06/01/19  7:39 PM   Specimen: BLOOD  Result Value Ref Range Status   Specimen Description BLOOD RIGHT ANTECUBITAL  Final   Special Requests   Final    BOTTLES DRAWN AEROBIC AND ANAEROBIC Blood Culture adequate volume   Culture   Final    NO GROWTH 2 DAYS Performed at [facility], [address] [place], [place] [address]    Report Status PENDING  Incomplete  Urine culture     Status: Abnormal (Preliminary result)   Collection Time: 06/01/19  8:36 PM   Specimen: Urine, Catheterized  Result Value Ref Range Status   Specimen Description   Final    URINE,  CATHETERIZED Performed at [facility], [address] [place], [place] [address]    Special Requests   Final    NONE Performed at [facility], [address] [place], [place] [address]    Culture (A)  Final    >=100,000 COLONIES/mL ESCHERICHIA COLI SUSCEPTIBILITIES TO FOLLOW Performed at [facility], [place] [address], [place], [place] [address]    Report Status PENDING  Incomplete  SARS CORONAVIRUS 2 (TAT 6-24 HRS) Nasopharyngeal Nasopharyngeal Swab     Status: Abnormal   Collection Time: 06/01/19 10:42 PM   Specimen: Nasopharyngeal Swab  Result Value Ref Range Status   SARS Coronavirus 2 POSITIVE (A) NEGATIVE Final    Comment: RESULT CALLED TO, READ BACK BY AND VERIFIED WITH: MALKA  SINWANY RN.@[email or web address]  ON 1.10.21 BY TCALDWELL MT. (NOTE) SARS-CoV-2 target nucleic acids are DETECTED. The SARS-CoV-2 RNA is generally detectable in upper and lower respiratory specimens during the acute phase of infection. Positive results are indicative of the presence of SARS-CoV-2 RNA. Clinical correlation with patient history and other diagnostic information is  necessary to determine patient infection status. Positive results do not rule out bacterial infection or co-infection with other viruses.  The expected result is Negative. Fact Sheet for Patients: SugarRoll.be Fact Sheet for Healthcare Providers: https://www.woods-mathews.com/ This test is not yet approved or cleared by the [place] FDA and  has been authorized for detection and/or diagnosis of SARS-CoV-2 by FDA under an Emergency Use Authorization (EUA). This EUA will remain  in effect (meaning this test can b e used) for the duration of the COVID-19 declaration under Section 564(b)(1) of the Act, 21 U.S.C. section 360bbb-3(b)(1), unless the authorization is terminated or revoked sooner. Performed at [facility], [place] [address], [place], [place] [address]       Radiology Studies: CT Head Wo Contrast  Result Date: 06/01/2019 CLINICAL DATA:  Fall x2 today.  Altered mental status. EXAM: CT HEAD WITHOUT CONTRAST CT CERVICAL SPINE WITHOUT CONTRAST TECHNIQUE: Multidetector CT imaging of the head and cervical spine was performed following the standard protocol without intravenous contrast. Multiplanar CT image reconstructions of the cervical spine were also generated. COMPARISON:  None. FINDINGS: CT HEAD FINDINGS Brain: No evidence of parenchymal hemorrhage or extra-axial fluid collection. No mass lesion, mass effect, or midline shift. No CT evidence of acute infarction. Generalized cerebral volume loss. Nonspecific prominent subcortical and periventricular white matter hypodensity, most in keeping with chronic small vessel ischemic change. No ventriculomegaly. Vascular: No acute abnormality. Skull: No evidence of calvarial fracture. Sinuses/Orbits: No fluid levels. Mild mucoperiosteal thickening throughout the paranasal sinuses bilaterally. Other: Mild partial bilateral mastoid effusions. CT CERVICAL SPINE FINDINGS Alignment: Straightening of the cervical spine. No facet subluxation. Dens is well positioned between the lateral masses of C1. Minimal 2 mm anterolisthesis at C5-6. Skull base and vertebrae: No acute fracture. No primary bone lesion or focal pathologic process. Soft tissues and spinal canal: No prevertebral edema. No visible canal hematoma. Disc levels: Marked multilevel degenerative disc disease throughout the cervical spine, most prominent at C4-5 and C5-6. Moderate bilateral cervical facet arthropathy. No significant degenerative foraminal stenosis. Upper chest: No acute abnormality. Other: No discrete thyroid nodules. No pathologically enlarged cervical nodes. IMPRESSION: 1. No evidence of acute intracranial abnormality. No evidence of calvarial fracture. 2. Generalized cerebral volume loss and prominent chronic small vessel ischemic changes in the  cerebral white matter. 3. No cervical spine fracture or facet subluxation. 4. Advanced multilevel degenerative changes in the cervical spine as detailed. Electronically Signed   By: Ilona Sorrel M.D.   On: 06/01/2019 20:34   CT Cervical Spine Wo Contrast  Result Date: 06/01/2019 CLINICAL DATA:  Fall x2 today.  Altered mental status. EXAM: CT HEAD WITHOUT CONTRAST CT CERVICAL SPINE WITHOUT CONTRAST TECHNIQUE: Multidetector CT imaging of the head and cervical spine was performed following the standard protocol without intravenous contrast. Multiplanar CT image reconstructions of the cervical spine were also generated. COMPARISON:  None. FINDINGS: CT HEAD FINDINGS Brain: No evidence of parenchymal hemorrhage or extra-axial fluid collection. No mass lesion, mass effect, or midline shift. No CT evidence of acute infarction. Generalized cerebral volume loss. Nonspecific prominent subcortical and periventricular white matter hypodensity, most in keeping with chronic small vessel ischemic change. No ventriculomegaly. Vascular: No  acute abnormality. Skull: No evidence of calvarial fracture. Sinuses/Orbits: No fluid levels. Mild mucoperiosteal thickening throughout the paranasal sinuses bilaterally. Other: Mild partial bilateral mastoid effusions. CT CERVICAL SPINE FINDINGS Alignment: Straightening of the cervical spine. No facet subluxation. Dens is well positioned between the lateral masses of C1. Minimal 2 mm anterolisthesis at C5-6. Skull base and vertebrae: No acute fracture. No primary bone lesion or focal pathologic process. Soft tissues and spinal canal: No prevertebral edema. No visible canal hematoma. Disc levels: Marked multilevel degenerative disc disease throughout the cervical spine, most prominent at C4-5 and C5-6. Moderate bilateral cervical facet arthropathy. No significant degenerative foraminal stenosis. Upper chest: No acute abnormality. Other: No discrete thyroid nodules. No pathologically enlarged  cervical nodes. IMPRESSION: 1. No evidence of acute intracranial abnormality. No evidence of calvarial fracture. 2. Generalized cerebral volume loss and prominent chronic small vessel ischemic changes in the cerebral white matter. 3. No cervical spine fracture or facet subluxation. 4. Advanced multilevel degenerative changes in the cervical spine as detailed. Electronically Signed   By: Ilona Sorrel M.D.   On: 06/01/2019 20:34   US Carotid Bilateral  Result Date: 06/02/2019 CLINICAL DATA:  Altered mental status and fall. EXAM: BILATERAL CAROTID DUPLEX ULTRASOUND TECHNIQUE: Pearline Cables scale imaging, color Doppler and duplex ultrasound were performed of bilateral carotid and vertebral arteries in the neck. COMPARISON:  10/01/2017 FINDINGS: Criteria: Quantification of carotid stenosis is based on velocity parameters that correlate the residual internal carotid diameter with NASCET-based stenosis levels, using the diameter of the distal internal carotid lumen as the denominator for stenosis measurement. The following velocity measurements were obtained: RIGHT ICA: 73/17 cm/sec CCA: 123456 cm/sec SYSTOLIC ICA/CCA RATIO:  1.0 ECA: 370 cm/sec LEFT ICA: 89/15 cm/sec CCA: 0000000 cm/sec SYSTOLIC ICA/CCA RATIO:  1.2 ECA: 202 cm/sec RIGHT CAROTID ARTERY: Elevated peak systolic velocity in the right external carotid artery. Small amount of plaque in the proximal internal and external carotid arteries. Normal waveforms and velocities in the internal carotid artery. Grayscale imaging is limited compared to the previous examination. RIGHT VERTEBRAL ARTERY: Antegrade flow and normal waveform in the right vertebral artery. LEFT CAROTID ARTERY: Left carotid arteries are patent without significant plaque. External carotid artery is patent with normal waveform. Normal waveforms and velocities in the internal carotid artery. LEFT VERTEBRAL ARTERY: Antegrade flow and normal waveform in the left vertebral artery. IMPRESSION: 1. Evidence for at  least mild atherosclerotic disease in the carotid arteries although the grayscale imaging is limited on this examination compared to the previous examination. Estimated degree of stenosis in the internal carotid arteries is less than 50% bilaterally. Probable stenosis in the right external carotid artery. 2. Patent vertebral arteries with antegrade flow. Electronically Signed   By: Markus Daft M.D.   On: 06/02/2019 10:04   DG Chest Portable 1 View  Result Date: 06/01/2019 CLINICAL DATA:  Dyspnea, fall EXAM: PORTABLE CHEST 1 VIEW COMPARISON:  09/30/2017 chest radiograph. FINDINGS: Stable cardiomediastinal silhouette with top-normal heart size. No pneumothorax. No pleural effusion. Slightly low lung volumes. No overt pulmonary edema. No acute consolidative airspace disease. No displaced fractures in the visualized chest. IMPRESSION: Slightly low lung volumes.  No active cardiopulmonary disease. Electronically Signed   By: Ilona Sorrel M.D.   On: 06/01/2019 20:52    Scheduled Meds:  vitamin C  500 mg Oral Daily   dexamethasone  6 mg Oral Daily   diltiazem  180 mg Oral Daily   donepezil  5 mg Oral Daily   enoxaparin (LOVENOX) injection  40 mg Subcutaneous Q24H   feeding supplement (ENSURE ENLIVE)  237 mL Oral TID BM   Ipratropium-Albuterol  1 puff Inhalation Q6H   [START ON 06/04/2019] multivitamin with minerals  1 tablet Oral Daily   rosuvastatin  5 mg Oral Daily   sertraline  25 mg Oral Daily   zinc sulfate  220 mg Oral Daily   Continuous Infusions:  sodium chloride Stopped (06/03/19 1219)   cefTRIAXone (ROCEPHIN)  IV Stopped (06/02/19 2202)   remdesivir 100 mg in NS 100 mL Stopped (06/03/19 1041)     LOS: 2 days   Time spent: 35 minutes.  Max Sane, MD [facility] Pager [phone]  If 7PM-7AM, please contact night-coverage [email or web address] Password [facility] 06/03/2019, 7:56 PM   This record has been created using Dragon voice recognition software. Errors have been  sought and corrected,but may not always be located. Such creation errors do not reflect on the standard of care.

## 2019-06-03 NOTE — Consult Note (Addendum)
Consultation Note Date: 06/03/2019   Patient Name: Susan [name]  DOB: [birth year]  MRN: [id number]  Age / Sex: 79 y.o., female  PCP: [name], MD Referring Physician: Max Sane, MD  Reason for Consultation: Establishing goals of care  HPI/Patient Profile: Susan [name] is a 79 y.o. female with medical history significant for COPD on home O2 at 2 L, hypertension, dementia, peptic ulcer disease, disabling arthritis, dependent on motorized wheelchair, as well as history of falls in the past but who lives alone at home independently, who was brought into the emergency room because of confusion and somnolence noticed by her daughters who check in on her.She sustained a fall on the day of arrival.  Clinical Assessment and Goals of Care: Patient is on isolation precautions and is confused per staff. Spoke with daughter [name]. She states she did not expect her mother to have covid as she was completely asymptomatic and it was only detected because of mandatory hospital screening.  She states her mother has 3 children. She was scheduled to come live with [name] at the end of the month because she was forgetting to take medications. She has lived alone, and uses a power wheel chair. She can fix food for herself, and do very light cleaning, but uses meals on wheels and has family members to come in and clean.   She states she his her mother's HPOA, and that her mother has a living will, but she is not aware of the contents. She states she will need to find it. To her knowledge, there are no limits on her mother's care at this time.      SUMMARY OF RECOMMENDATIONS   Full code/full scope. Daughter to look for living will papers.   Prognosis:   Poor overall     Primary Diagnoses: Present on Admission: . (Resolved) ILD (interstitial lung disease) ([place]) . (Resolved) Centrilobular emphysema ([place]) .  Coronary artery disease involving native coronary artery of native heart with unstable angina pectoris ([place]) . Mixed Alzheimer's and vascular dementia ([place])   I have reviewed the medical record, interviewed the patient and family, and examined the patient. The following aspects are pertinent.  Past Medical History:  Diagnosis Date  . Acute peptic ulcer, unspecified site, with hemorrhage and perforation, without mention of obstruction   . Arthritis   . Asthma   . Balance problems    unsteady on feet at time  . Cerebral amyloid angiopathy ([place])    a. 06/2017 admit w/ aphasia and blurred vision.  RI w/ extensive chronic microhemorrhages c/w amyloid angiopathy-->ASA initiated.  . Chronic airway obstruction, not elsewhere classified    on inhalers  . Diastolic dysfunction    a. 06/2017 Echo: EF 50-55%, no rwma, Gr1 DD, nl LA size, nl RV fxn.  . Diverticulosis of colon (without mention of hemorrhage)   . Hematochezia   . 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 Morbid obesity ([place])   . Non-obstructive CAD (coronary artery disease)    a.  12/2012 MV: EF 71%, no ischemia; b. 06/2016 Cath: LM nl, LAD 50p, LCX nl, RCA 58m, EF 55-65%.  . OSA (obstructive sleep apnea)   . Osteoarthritis    a. s/p bilat hip replacements.  Marland Kitchen UTI (urinary tract infection)    a. 09/2017 admit for E coli & Klebsiella UTI.  . Ventral hernia    Social History   Socioeconomic History  . Marital status: Divorced    Spouse name: Not on file  . Number of children: 3  . Years of education: Not on file  . Highest education level: Not on file  Occupational History  . Occupation: self employeed    Comment: Owns beauty shop  Tobacco Use  . Smoking status: Former Smoker    Packs/day: 0.50    Years: 40.00    Pack years: 20.00    Types: Cigarettes    Quit date: 08/29/2000    Years since quitting: 18.7  . Smokeless tobacco: Never Used  Substance and Sexual Activity  . Alcohol use: No    Alcohol/week: 0.0  standard drinks    Comment: rare  . Drug use: No  . Sexual activity: Not Currently  Other Topics Concern  . Not on file  Social History Narrative   Diet: Moderate, low fat, skips meals, some fruit and veggies   Regular exercise:  No   HCPOA is [name] and [name],  Has living will, Not sure of code status (reviewed 2013)   Social Determinants of Health   Financial Resource Strain: Low Risk   . Difficulty of Paying Living Expenses: Not hard at all  Food Insecurity: No Food Insecurity  . Worried About Charity fundraiser in the Last Year: Never true  . Ran Out of Food in the Last Year: Never true  Transportation Needs: No Transportation Needs  . Lack of Transportation (Medical): No  . Lack of Transportation (Non-Medical): No  Physical Activity: Inactive  . Days of Exercise per Week: 0 days  . Minutes of Exercise per Session: 0 min  Stress: No Stress Concern Present  . Feeling of Stress : Not at all  Social Connections:   . Frequency of Communication with Friends and Family: Not on file  . Frequency of Social Gatherings with Friends and Family: Not on file  . Attends Religious Services: Not on file  . Active Member of Clubs or Organizations: Not on file  . Attends Archivist Meetings: Not on file  . Marital Status: Not on file   Family History  Problem Relation Age of Onset  . Diabetes Mother   . Coronary artery disease Mother   . Lung disease Mother   . Heart disease Mother        CAD  . Coronary artery disease Father   . Kidney disease Father   . Diabetes Father   . Heart disease Father        CAD  . Cancer Neg Hx    Scheduled Meds: . vitamin C  500 mg Oral Daily  . dexamethasone  6 mg Oral Daily  . diltiazem  180 mg Oral Daily  . donepezil  5 mg Oral Daily  . enoxaparin (LOVENOX) injection  40 mg Subcutaneous Q24H  . Ipratropium-Albuterol  1 puff Inhalation Q6H  . rosuvastatin  5 mg Oral Daily  . sertraline  25 mg Oral Daily  . zinc  sulfate  220 mg Oral Daily   Continuous Infusions: . sodium chloride Stopped (06/03/19 1219)  .  cefTRIAXone (ROCEPHIN)  IV Stopped (06/02/19 2202)  . remdesivir 100 mg in NS 100 mL Stopped (06/03/19 1041)   PRN Meds:.sodium chloride, albuterol, chlorpheniramine-HYDROcodone, guaiFENesin-dextromethorphan, traZODone Medications Prior to Admission:  Prior to Admission medications   Medication Sig Start Date End Date Taking? Authorizing Provider  budesonide-formoterol (SYMBICORT) 160-4.5 MCG/ACT inhaler TAKE 2 PUFFS INTO LUNGS TWICE A DAY Patient taking differently: Inhale 2 puffs into the lungs 2 (two) times daily.  05/28/19  Yes Bedsole, Amy E, MD  cephALEXin (KEFLEX) 500 MG capsule Take 1 capsule (500 mg total) by mouth 3 (three) times daily. 05/28/19  Yes Bedsole, Amy E, MD  diltiazem (CARDIZEM CD) 180 MG 24 hr capsule Take 1 capsule (180 mg total) by mouth daily. 03/05/19  Yes Gollan, Kathlene November, MD  donepezil (ARICEPT) 5 MG tablet Take 5 mg by mouth daily.  05/08/19 05/07/20 Yes [provider]  rosuvastatin (CRESTOR) 5 MG tablet Take 1 tablet (5 mg total) by mouth daily. 03/05/19  Yes Minna Merritts, MD  tiotropium (SPIRIVA) 18 MCG inhalation capsule Place 1 capsule (18 mcg total) into inhaler and inhale daily. 05/28/19  Yes Bedsole, Amy E, MD  traZODone (DESYREL) 50 MG tablet TAKE 1 & 1/2 TABLETS BY MOUTH AT [facility] NEEDED FOR SLEEP Patient taking differently: Take 75 mg by mouth at bedtime as needed for sleep.  04/13/18  Yes Bedsole, Amy E, MD  ZOLOFT 25 MG tablet Take 25 mg by mouth daily.  04/10/19  Yes [provider]  OXYGEN Inhale 2 L into the lungs daily.    [provider]    Vital Signs: BP (!) 122/98 (BP Location: Right Arm)   Pulse 65   Temp 98 F (36.7 C) (Axillary)   Resp (!) 21   Ht 5\' 4"  (1.626 m)   Wt 104.3 kg   SpO2 96%   BMI 39.48 kg/m  Pain Scale: 0-10   Pain Score: 0-No pain   SpO2: SpO2: 96 % O2 Device:SpO2: 96 % O2 Flow Rate:  .   IO: Intake/output summary:   Intake/Output Summary (Last 24 hours) at 06/03/2019 1438 Last data filed at 06/03/2019 1422 Gross per 24 hour  Intake 569.79 ml  Output --  Net 569.79 ml    LBM: Last BM Date: 06/03/19 Baseline Weight: Weight: 104.3 kg Most recent weight: Weight: 104.3 kg     Palliative Assessment/Data:    COVID-19 DISASTER DECLARATION:    PHYSICAL EXAMINATION WAS NOT POSSIBLE DUE TO TREATMENT OF COVID-19  AND CONSERVATION OF PERSONAL PROTECTIVE EQUIPMENT.   Patient assessed or the symptoms described in the history of present illness.  In the context of the Global COVID-19 pandemic, which necessitated consideration that the patient might be at risk for infection with the SARS-CoV-2 virus that causes COVID-19, Institutional protocols and algorithms that pertain to the evaluation of patients at risk for COVID-19 are in a state of rapid change based on information released by regulatory bodies including the CDC and federal and state organizations. These policies and algorithms were followed during the patient's care while in hospital.  Time In: 2:10 Time Out: 3:00 Time Total: 50 min Greater than 50%  of this time was spent counseling and coordinating care related to the above assessment and plan.  Signed by: Asencion Gowda, NP   Please contact Palliative Medicine Team phone at [phone] for questions and concerns.  For individual provider: See Shea Evans

## 2019-06-04 DIAGNOSIS — F0150 Vascular dementia without behavioral disturbance: Secondary | ICD-10-CM

## 2019-06-04 DIAGNOSIS — G309 Alzheimer's disease, unspecified: Secondary | ICD-10-CM

## 2019-06-04 DIAGNOSIS — F0280 Dementia in other diseases classified elsewhere without behavioral disturbance: Secondary | ICD-10-CM

## 2019-06-04 DIAGNOSIS — U071 COVID-19: Secondary | ICD-10-CM

## 2019-06-04 DIAGNOSIS — R296 Repeated falls: Secondary | ICD-10-CM

## 2019-06-04 DIAGNOSIS — J1282 Pneumonia due to coronavirus disease 2019: Secondary | ICD-10-CM

## 2019-06-04 LAB — URINE CULTURE: Culture: >=100000 — AB

## 2019-06-04 LAB — COMPREHENSIVE METABOLIC PANEL
ALT: 21 U/L (ref 0–44)
AST: 22 U/L (ref 15–41)
Albumin: 3.4 g/dL — ABNORMAL LOW (ref 3.5–5.0)
Alkaline Phosphatase: 77 U/L (ref 38–126)
Anion gap: 11 (ref 5–15)
BUN: 24 mg/dL — ABNORMAL HIGH (ref 8–23)
CO2: 25 mmol/L (ref 22–32)
Calcium: 8.5 mg/dL — ABNORMAL LOW (ref 8.9–10.3)
Chloride: 108 mmol/L (ref 98–111)
Creatinine, Ser: 0.72 mg/dL (ref 0.44–1.00)
GFR calc Af Amer: >60 mL/min (ref >60)
GFR calc non Af Amer: >60 mL/min (ref >60)
Glucose, Bld: 106 mg/dL — ABNORMAL HIGH (ref 70–99)
Potassium: 3.6 mmol/L (ref 3.5–5.1)
Sodium: 144 mmol/L (ref 135–145)
Total Bilirubin: 0.8 mg/dL (ref 0.3–1.2)
Total Protein: 7.3 g/dL (ref 6.5–8.1)

## 2019-06-04 LAB — CBC WITH DIFFERENTIAL/PLATELET
Abs Immature Granulocytes: 0.03 10*3/uL (ref 0.00–0.07)
Basophils Absolute: 0 10*3/uL (ref 0.0–0.1)
Basophils Relative: 0 %
Eosinophils Absolute: 0 10*3/uL (ref 0.0–0.5)
Eosinophils Relative: 0 %
HCT: 46.2 % — ABNORMAL HIGH (ref 36.0–46.0)
Hemoglobin: 14.7 g/dL (ref 12.0–15.0)
Immature Granulocytes: 0 %
Lymphocytes Relative: 12 %
Lymphs Abs: 0.9 10*3/uL (ref 0.7–4.0)
MCH: 29.1 pg (ref 26.0–34.0)
MCHC: 31.8 g/dL (ref 30.0–36.0)
MCV: 91.5 fL (ref 80.0–100.0)
Monocytes Absolute: 0.7 10*3/uL (ref 0.1–1.0)
Monocytes Relative: 9 %
Neutro Abs: 5.9 10*3/uL (ref 1.7–7.7)
Neutrophils Relative %: 79 %
Platelets: 207 10*3/uL (ref 150–400)
RBC: 5.05 MIL/uL (ref 3.87–5.11)
RDW: 14.6 % (ref 11.5–15.5)
WBC: 7.5 10*3/uL (ref 4.0–10.5)
nRBC: 0 % (ref 0.0–0.2)

## 2019-06-04 LAB — FIBRIN DERIVATIVES D-DIMER (ARMC ONLY): Fibrin derivatives D-dimer (ARMC): 1177.31 ng/mL (FEU) — ABNORMAL HIGH (ref 0.00–499.00)

## 2019-06-04 LAB — PHOSPHORUS: Phosphorus: 3.5 mg/dL (ref 2.5–4.6)

## 2019-06-04 LAB — C-REACTIVE PROTEIN: CRP: 3.7 mg/dL — ABNORMAL HIGH (ref <1.0)

## 2019-06-04 LAB — MAGNESIUM: Magnesium: 2.6 mg/dL — ABNORMAL HIGH (ref 1.7–2.4)

## 2019-06-04 MED ORDER — TRAMADOL HCL 50 MG PO TABS: 50.0000 mg | ORAL_TABLET | Freq: Once | ORAL | Status: DC | PRN

## 2019-06-04 MED ORDER — ACETAMINOPHEN 325 MG PO TABS
650.0000 mg | ORAL_TABLET | Freq: Four times a day (QID) | ORAL | Status: DC | PRN
  Administered 2019-06-04: 650 mg via ORAL
  Filled 2019-06-04: qty 2

## 2019-06-04 NOTE — Progress Notes (Signed)
OT Cancellation Note  Patient Details Name: Susan [name] MRN: [id number] DOB: February 26, 1941   Cancelled Treatment:    Reason Eval/Treat Not Completed: Other (comment). Upon attempt, PT working with pt. Will re-attempt.  Jeni Salles, MPH, MS, OTR/L ascom [phone] 06/04/19, 2:37 PM

## 2019-06-04 NOTE — Progress Notes (Addendum)
Daily Progress Note   Patient Name: Susan [name]       Date: 06/04/2019 DOB: 12-23-1940  Age: 79 y.o. MRN#: [id number] Attending Physician: Oren Binet* Primary Care Physician: [name], MD Admit Date: 06/01/2019  Reason for Consultation/Follow-up: Establishing goals of care  Subjective: Patient is on Covid isolation precautions. Per nursing she is confused and threatening to call the police. Per nursing, she has been eating well and taking medications. She does not answer the phone. Called daughter to offer support and to inquire about living will, with no answer.   Length of Stay: 3  Current Medications: Scheduled Meds:  . vitamin C  500 mg Oral Daily  . dexamethasone  6 mg Oral Daily  . diltiazem  180 mg Oral Daily  . donepezil  5 mg Oral Daily  . enoxaparin (LOVENOX) injection  40 mg Subcutaneous Q24H  . feeding supplement (ENSURE ENLIVE)  237 mL Oral TID BM  . Ipratropium-Albuterol  1 puff Inhalation Q6H  . multivitamin with minerals  1 tablet Oral Daily  . rosuvastatin  5 mg Oral Daily  . sertraline  25 mg Oral Daily  . zinc sulfate  220 mg Oral Daily    Continuous Infusions: . sodium chloride Stopped (06/03/19 1219)  . cefTRIAXone (ROCEPHIN)  IV 1 g (06/03/19 2216)  . remdesivir 100 mg in NS 100 mL 100 mg (06/04/19 0910)    PRN Meds: sodium chloride, albuterol, chlorpheniramine-HYDROcodone, guaiFENesin-dextromethorphan, traZODone   Vital Signs: BP 135/80 (BP Location: Left Arm)   Pulse 64   Temp 98.9 F (37.2 C) (Oral)   Resp 14   Ht 5\' 4"  (1.626 m)   Wt 104.3 kg   SpO2 98%   BMI 39.48 kg/m  SpO2: SpO2: 98 % O2 Device: O2 Device: Room Air O2 Flow Rate:    Intake/output summary:   Intake/Output Summary (Last 24 hours) at 06/04/2019  1059 Last data filed at 06/04/2019 0500 Gross per 24 hour  Intake 267.72 ml  Output 250 ml  Net 17.72 ml   LBM: Last BM Date: 06/03/19 Baseline Weight: Weight: 104.3 kg Most recent weight: Weight: 104.3 kg       Palliative Assessment/Data:      Patient Active Problem List   Diagnosis Date Noted  . UTI (urinary tract infection) 06/01/2019  .  Acute metabolic encephalopathy 123XX123  . Frequent falls 06/01/2019  . Altered mental status 05/28/2019  . Mixed Alzheimer's and vascular dementia ([place]) 05/28/2019  . Seizures ([place]) 11/02/2017  . GAD (generalized anxiety disorder) 08/11/2017  . Hypoxia 08/11/2017  . Unstable angina ([place]) 07/08/2016  . Mixed hyperlipidemia   . Aortic atherosclerosis ([place]) 07/06/2016  . Coronary artery disease involving native coronary artery of native heart with unstable angina pectoris ([place]) 07/06/2016  . Acute on chronic respiratory failure with hypoxia ([place]) 04/18/2016  . Balance problem 10/08/2015  . Hx of urinary tract infection 06/23/2015  . Chronic insomnia 12/09/2014  . Primary osteoarthritis of left knee 10/03/2014  . Primary osteoarthritis of right knee 08/08/2014  . COPD with acute exacerbation ([place]) 10/22/2013  . Glenohumeral arthritis, Severe 08/20/2013  . Solitary pulmonary nodule 02/19/2013  . Shortness of breath 02/12/2013  . Obstructive sleep apnea 10/11/2012  . Umbilical hernia [id number]  . Urinary incontinence, urge 01/17/2012  . Diastolic dysfunction [id number]  . Preoperative cardiovascular examination 04/21/2011  . DDD (degenerative disc disease) 10/26/2010  . RESTLESS LEG SYNDROME 02/23/2010  . HIP REPLACEMENT, LEFT, HX OF 12/21/2009  . FOOT PAIN, BILATERAL 03/25/2009  . HYPERCHOLESTEROLEMIA 06/18/2008  . ALLERGIC RHINITIS 05/21/2008  . ANKLE PAIN, BILATERAL 05/21/2008  . DIVERTICULOSIS, COLON 11/26/2007  . 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  Full code/full scope.    Code Status:    Code Status Orders  (From admission, onward)         Start     Ordered   06/01/19 2149  Full code  Continuous     06/01/19 2151        Code Status History    Date Active Date Inactive Code Status Order ID Comments User Context   09/28/2017 1826 10/02/2017 1752 DNR [id number]  Saundra Shelling, MD ED   07/06/2017 2033 07/07/2017 1844 Full Code [id number]  Henreitta Leber, MD Inpatient   04/18/2016 0153 04/21/2016 2039 Full Code [id number]  Harvie Bridge, DO Inpatient   10/03/2014 1439 10/05/2014 1649 Full Code [id number]  Rod Can, MD Inpatient   08/08/2014 1222 08/10/2014 1800 Full Code [id number]  Rod Can, MD Inpatient   Advance Care Planning Activity    Advance Directive Documentation     Most Recent Value  Type of Advance Directive  Living will  Pre-existing out of facility DNR order (yellow form or pink MOST form)  --  "MOST" Form in Place?  --       Prognosis:   Poor overall   COVID-19 DISASTER DECLARATION:    FULL CONTACT PHYSICAL EXAMINATION WAS NOT POSSIBLE DUE TO TREATMENT OF COVID-19  AND CONSERVATION OF PERSONAL PROTECTIVE EQUIPMENT   Patient assessed or the symptoms described in the history of present illness.  In the context of the Global COVID-19 pandemic, which necessitated consideration that the patient might be at risk for infection with the SARS-CoV-2 virus that causes COVID-19, Institutional protocols and algorithms that pertain to the evaluation of patients at risk for COVID-19 are in a state of rapid change based on information released by regulatory bodies including the CDC and federal and state organizations. These policies and algorithms were followed during the patient's care while in hospital.  Thank you for allowing the Palliative Medicine Team to assist in the care of this patient.   Total Time 15 min Prolonged Time Billed  no  Greater than 50%  of this time was spent counseling and coordinating care related to the above assessment and plan.  Airik Goodlin, NP  Please contact Palliative Medicine Team phone at [phone] for questions and concerns.      

## 2019-06-04 NOTE — Progress Notes (Signed)
Physical Therapy Treatment Patient Details Name: Susan [name] MRN: [id number] DOB: 1941-02-26 Today's Date: 06/04/2019    History of Present Illness Patient is 79 year old female admitted from home with SOB, actue exacerbation of COPD. Covid test positive. PMH includes: COPD, Home O2, HTN, RA, mixed alzheimer's and vascular dementia.    PT Comments    Pt received resting in supine. Noted to have had episode of urinary incontinence with external catheter behind low back and linens and gown soaking wet. Pt unaware. Pt mod I to get EOB in order to allow for linen change. Pt tolerated sitting EOB >15 min on RA. Pt performed STS x3 trials with RW. Pt required min guard assist for transfers. Pt tolerated standing first bout ~1 min to allow for self care before requesting to sit down and rest. Second trial pt required continued vc for hand placement with little carryover from first trial and able to tolerate slightly greater time standing performing standing marching for LE strengthening and endurance. RR increased to 40. After sitting rest pt performed x3 trial of standing again increasing standing tolerance. Pt performed standing LE strengthening with standing hip ABD and standing hip extensions. Pt RR again increasing to 40. Pt ended session resting comfortably in supine with clean linens on bed and new gown with all needs in reach. Nursing notified of period of incontinence and need for external catheter. Pt will continue to benefit from acute PT to continue improving noted deficits.   Follow Up Recommendations  Home health PT;Supervision for mobility/OOB     Equipment Recommendations  None recommended by PT    Recommendations for Other Services       Precautions / Restrictions Precautions Precautions: Fall Restrictions Weight Bearing Restrictions: No    Mobility  Bed Mobility Overal bed mobility: Modified Independent             General bed mobility comments: mod I to get EOB  and return to supine, utilizes mildly increased time and effort and bed rails  Transfers Overall transfer level: Needs assistance Equipment used: Rolling walker (2 wheeled) Transfers: Sit to/from Stand Sit to Stand: Min guard         General transfer comment: min guard for safety, x3 trials from bed, cuing for hand placement and RW mgt with little carryover between trials  Ambulation/Gait Ambulation/Gait assistance: Min guard Gait Distance (Feet): 4 Feet Assistive device: Rolling walker (2 wheeled)   Gait velocity: decreased   General Gait Details: min guard to take a few steps forward and backward with RW and side step towards head of bed, pt demo increased work of breathing during Administrator, arts    Modified Rankin (Stroke Patients Only)       Balance Overall balance assessment: Needs assistance Sitting-balance support: Feet supported;Single extremity supported Sitting balance-Leahy Scale: Good     Standing balance support: Bilateral upper extremity supported;During functional activity Standing balance-Leahy Scale: Fair                              Cognition Arousal/Alertness: Awake/alert Behavior During Therapy: WFL for tasks assessed/performed Overall Cognitive Status: No family/caregiver present to determine baseline cognitive functioning                                 General  Comments: pt alert and oriented presenting with intermittent confusion, follows one step commands with cuing and increased time      Exercises Other Exercises Other Exercises: pt performing LE therex including standing marching, hip abd and hip extension built into standing bouts, min cuing for correct performance and min guard for safety, increased work of breathing and RR    General Comments        Pertinent Vitals/Pain Pain Assessment: No/denies pain    Home Living Family/patient expects to be discharged  to:: Private residence Living Arrangements: Children Available Help at Discharge: Available PRN/intermittently Type of Home: House Home Access: Level entry   Home Layout: One level Home Equipment: Youth worker - 2 wheels;Shower seat;Grab bars - tub/shower;Hand held shower head      Prior Function Level of Independence: Independent with assistive device(s)      Comments: Patient is limited ambulator at baseline due to RA. She uses motorized scooter at home. Planning to be discharged to her daughter's home. Pt reports indep with ADL and family assists some with med mgt and driving   PT Goals (current goals can now be found in the care plan section) Acute Rehab PT Goals Patient Stated Goal: to go home to daughter's house Progress towards PT goals: Progressing toward goals    Frequency    Min 2X/week      PT Plan Current plan remains appropriate    Co-evaluation              AM-PAC PT "6 Clicks" Mobility   Outcome Measure  Help needed turning from your back to your side while in a flat bed without using bedrails?: None Help needed moving from lying on your back to sitting on the side of a flat bed without using bedrails?: None Help needed moving to and from a bed to a chair (including a wheelchair)?: A Little Help needed standing up from a chair using your arms (e.g., wheelchair or bedside chair)?: A Little Help needed to walk in hospital room?: A Lot Help needed climbing 3-5 steps with a railing? : A Lot 6 Click Score: 18    End of Session Equipment Utilized During Treatment: Gait belt Activity Tolerance: Patient tolerated treatment well Patient left: in bed;with call bell/phone within reach;with bed alarm set Nurse Communication: Mobility status PT Visit Diagnosis: Other abnormalities of gait and mobility (R26.89);Muscle weakness (generalized) (M62.81)     Time: 1420-1505 PT Time Calculation (min) (ACUTE ONLY): 45 min  Charges:  $Therapeutic  Activity: 38-52 mins                     Thamas Appleyard PT, DPT 5:10 PM,06/04/19 [phone]    Susan [name] 06/04/2019, 5:02 PM

## 2019-06-04 NOTE — Evaluation (Signed)
Occupational Therapy Evaluation Patient Details Name: Susan [name] MRN: [id number] DOB: Jan 01, 1941 Today's Date: 06/04/2019    History of Present Illness Patient is 79 year old female admitted from home with SOB, actue exacerbation of COPD. Covid test pending. PMH includes: COPD, Home O2, HTN, RA, mixed alzheimer's and vascular dementia.   Clinical Impression   Pt was seen for OT evaluation this date. Prior to hospital admission, pt was living by herself with support from daughters who live locally. Pt reports living in an apartment with walk in shower, shower chair, level entry. Pt alert and oriented to self and place and generally to situation. Currently pt demonstrates impairments as described below (See OT problem list below) which functionally limit her ability to perform ADL/self-care tasks. Pt currently requires Min A for LB ADL and CGA for functional ADL mobility. Pt instructed in energy conservation strategies including pursed lip breathing to support participation in ADL and minimize risk of falls/over exertion/SOB. Pt would benefit from skilled OT to address noted impairments and functional limitations (see below for any additional details) in order to maximize safety and independence while minimizing falls risk and caregiver burden. Upon hospital discharge, recommend pt discharge with [place] and 24/7 supervision/assist.    Follow Up Recommendations  Supervision/Assistance - 24 hour;Home health OT    Equipment Recommendations  3 in 1 bedside commode    Recommendations for Other Services       Precautions / Restrictions Precautions Precautions: Fall Restrictions Weight Bearing Restrictions: No      Mobility Bed Mobility                  Transfers                      Balance                                           ADL either performed or assessed with clinical judgement   ADL Overall ADL's : Needs assistance/impaired                                        General ADL Comments: Min A for LB ADL, CGA for functional ADL mobility/transfers; cues for energy conservation strategies     Vision Baseline Vision/History: Wears glasses Wears Glasses: At all times Patient Visual Report: No change from baseline Vision Assessment?: No apparent visual deficits     Perception     Praxis      Pertinent Vitals/Pain Pain Assessment: No/denies pain     Hand Dominance Right   Extremity/Trunk Assessment Upper Extremity Assessment Upper Extremity Assessment: Generalized weakness(grossly at least 4/5 bilaterally)   Lower Extremity Assessment Lower Extremity Assessment: Generalized weakness(grossly at least 4/5 bilat)       Communication Communication Communication: HOH   Cognition Arousal/Alertness: Awake/alert Behavior During Therapy: WFL for tasks assessed/performed Overall Cognitive Status: No family/caregiver present to determine baseline cognitive functioning                                 General Comments: pt alert and oriented x4, but seemingly confused at times (asked therapist during session where she could purchase white candles so she could practice blowing them out after  therapist instructed pt in pursed lip breathing and able to return demo), follows simple commands with cues   General Comments       Exercises Other Exercises Other Exercises: Pt instructed in energy conservation strategies, would benefit from additional instruction to support recall and carryover   Shoulder Instructions      Home Living Family/patient expects to be discharged to:: Private residence Living Arrangements: Children Available Help at Discharge: Available PRN/intermittently Type of Home: House Home Access: Level entry     Home Layout: One level     Bathroom Shower/Tub: Occupational psychologist: Handicapped height     Home Equipment: Youth worker - 2  wheels;Shower seat;Grab bars - tub/shower;Hand held shower head          Prior Functioning/Environment Level of Independence: Independent with assistive device(s)        Comments: Patient is limited ambulator at baseline due to RA. She uses motorized scooter at home. Planning to be discharged to her daughter's home. Pt reports indep with ADL and family assists some with med mgt and driving        OT Problem List: Decreased strength;Decreased activity tolerance;Decreased safety awareness;Decreased knowledge of use of DME or AE      OT Treatment/Interventions: Self-care/ADL training;Therapeutic exercise;Therapeutic activities;Cognitive remediation/compensation;DME and/or AE instruction;Patient/family education;Energy conservation    OT Goals(Current goals can be found in the care plan section) Acute Rehab OT Goals Patient Stated Goal: to go home to daughter's house OT Goal Formulation: With patient Time For Goal Achievement: 06/18/19 Potential to Achieve Goals: Good ADL Goals Pt Will Perform Lower Body Dressing: sit to/from stand;with supervision Pt Will Transfer to Toilet: ambulating;bedside commode;with supervision(LRAD for amb) Additional ADL Goal #1: Pt will utilize learned energy conservation strategies including pursed lip breathing during ADL and IADL Tasks to support breath recovery and minimize SOB/over exertion.  OT Frequency: Min 1X/week   Barriers to D/C:            Co-evaluation              AM-PAC OT "6 Clicks" Daily Activity     Outcome Measure Help from another person eating meals?: None   Help from another person toileting, which includes using toliet, bedpan, or urinal?: A Little Help from another person bathing (including washing, rinsing, drying)?: A Little Help from another person to put on and taking off regular upper body clothing?: None Help from another person to put on and taking off regular lower body clothing?: A Little 6 Click Score: 17    End of Session    Activity Tolerance: Patient tolerated treatment well Patient left: in bed;with call bell/phone within reach;with bed alarm set  OT Visit Diagnosis: Other abnormalities of gait and mobility (R26.89);Muscle weakness (generalized) (M62.81);Other symptoms and signs involving cognitive function                Time: [id number] OT Time Calculation (min): 22 min Charges:  OT General Charges $OT Visit: 1 Visit OT Evaluation $OT Eval Low Complexity: 1 Low OT Treatments $Self Care/Home Management : 8-22 mins  Jeni Salles, MPH, MS, OTR/L ascom [phone] 06/04/19, 4:01 PM

## 2019-06-04 NOTE — Progress Notes (Signed)
PROGRESS NOTE    Susan [name]  [id number] DOB: Feb 10, 1941 DOA: 06/01/2019 PCP: [name], MD   Brief Narrative:  Susan [name] is a 79 y.o. female with medical history significant for COPD on home O2 at 2 L, hypertension, dementia, peptic ulcer disease, disabling arthritis, dependent on motorized wheelchair, as well as history of falls in the past but who lives alone at home independently, who was brought into the emergency room because of confusion and somnolence noticed by her daughters who check in on her.She sustained a fall on the day of arrival. On arrival she was hypoxic, requiring 4 L, no radiologic evidence of any fracture, UA concerning for infection so she was started on ceftriaxone, also wheezing-admitted for COPD exacerbation. COVID-19  positive  Subjective:  Patient herself says that she is feeling okay.  I found her lying in bed with mitts on trying to get herself straightened up in bed.  I helped her push herself up in bed and she did not appear short of breath to me as she exerted herself.  Patient is conversant but clearly has memory issues.   Assessment & Plan:   Principal Problem:   COPD with acute exacerbation ([place]) Active Problems:   Acute on chronic respiratory failure with hypoxia (HCC)   Coronary artery disease involving native coronary artery of native heart with unstable angina pectoris (HCC)   Mixed Alzheimer's and vascular dementia ([place])   UTI (urinary tract infection)   Acute metabolic encephalopathy   Frequent falls  COPD with acute exacerbation ([place]).  Due to COVID 19 Remdesivir day 3/5 Decadron 3/10 No oxygen requirement at present   UTI Urine c/s grew E. Coli. She has completed 3-day course of ceftriaxone  Acute metabolic encephalopathy.   She does not appear delirious to me but does seem to have baseline dementia She likely sundown's in the evening. Avoid benzos  Fall in the setting of history of frequent falls No evidence  of acute injury on CT head and CT C-spine Multifactorial, secondary to Covid as well as chronic physical l deconditioning- dependent on motorized wheelchair . PT/OT recommends HHPT  Hypertension.   Normotensive this morning. Continue home dose of Cardizem.  CAD Continue statin. Patient does not appear to be on an antiplatelet agent or beta-blocker as an outpatient. Can be addressed as an outpatient if warranted.  Mixed Alzheimer's and vascular dementia ([place]). Continue home dose of donepezil and Zoloft.    Objective: Vitals:   06/03/19 0739 06/03/19 1648 06/04/19 0027 06/04/19 0910  BP: (!) 122/98 (!) 134/58 (!) 151/65 135/80  Pulse: 65 (!) 48 (!) 48 64  Resp:  17 16 14   Temp: 98 F (36.7 C) 98.9 F (37.2 C) 97.7 F (36.5 C) 98.9 F (37.2 C)  TempSrc: Axillary Oral Axillary Oral  SpO2: 96% 94% 95% 98%  Weight:      Height:        Intake/Output Summary (Last 24 hours) at 06/04/2019 1552 Last data filed at 06/04/2019 1517 Gross per 24 hour  Intake 209.2 ml  Output 250 ml  Net -40.8 ml   Filed Weights   06/01/19 1918  Weight: 104.3 kg    Examination:  General exam: Patient awake, alert, trying to push herself up in bed with reasonable force. Respiratory system: Clear to auscultation. Respiratory effort normal. Cardiovascular system: S1-S2, no M/R/G.   Gastrointestinal system: Obese, NABS, soft and nontender. Extremities: No edema, no cyanosis, pulses intact and symmetrical. Skin: No rashes, lesions or  ulcers   DVT prophylaxis: Lovenox Code Status: Full Family Communication:  TOC talked with family, they want mom to back home with home health not to SNF Disposition Plan: depending on clinical condition, mental status - HHPT  Consultants:   None  Procedures:  Antimicrobials:  Completed 3-day course of ceftriaxone  Data Reviewed: I have personally reviewed following labs and imaging studies  CBC: Recent Labs  Lab 06/01/19 1939 06/03/19 0655  06/04/19 0415  WBC 6.3 10.2 7.5  NEUTROABS  --  8.5* 5.9  HGB 14.1 14.2 14.7  HCT 43.4 43.9 46.2*  MCV 90.2 90.3 91.5  PLT 192 195 [id number]   Basic Metabolic Panel: Recent Labs  Lab 06/01/19 1939 06/03/19 0655 06/04/19 0415  NA 137 141 144  K 3.6 3.6 3.6  CL 100 107 108  CO2 25 22 25   GLUCOSE 93 110* 106*  BUN 17 21 24*  CREATININE 0.76 0.70 0.72  CALCIUM 8.6* 8.3* 8.5*  MG  --  2.3 2.6*  PHOS  --  3.3 3.5   GFR: Estimated Creatinine Clearance: 69.3 mL/min (by C-G formula based on SCr of 0.72 mg/dL). Liver Function Tests: Recent Labs  Lab 06/01/19 1939 06/03/19 0655 06/04/19 0415  AST 23 23 22   ALT 17 22 21   ALKPHOS 89 72 77  BILITOT 0.9 0.6 0.8  PROT 7.6 7.4 7.3  ALBUMIN 3.8 3.4* 3.4*   Anemia Panel: Recent Labs    06/02/19 1659  FERRITIN 171   Sepsis Labs: Recent Labs  Lab 06/01/19 1939 06/02/19 1659  PROCALCITON  --  <0.10  LATICACIDVEN 1.8  --     Recent Results (from the past 240 hour(s))  Urine culture     Status: None   Collection Time: 05/28/19  9:40 AM   Specimen: Urine  Result Value Ref Range Status   MICRO NUMBER: [id number]  Final   SPECIMEN QUALITY: Adequate  Final   Sample Source URINE  Final   STATUS: FINAL  Final   ISOLATE 1:   Final    Growth of mixed flora was isolated, suggesting probable contamination. No further testing will be performed. If clinically indicated, recollection using a method to minimize contamination, with prompt transfer to Urine Culture Transport Tube, is  recommended.   Blood culture (routine x 2)     Status: None (Preliminary result)   Collection Time: 06/01/19  7:39 PM   Specimen: BLOOD  Result Value Ref Range Status   Specimen Description BLOOD RIGHT ANTECUBITAL  Final   Special Requests   Final    BOTTLES DRAWN AEROBIC AND ANAEROBIC Blood Culture adequate volume   Culture   Final    NO GROWTH 3 DAYS Performed at [facility], [address], [place], [place] [address]    Report Status  PENDING  Incomplete  Urine culture     Status: Abnormal   Collection Time: 06/01/19  8:36 PM   Specimen: Urine, Catheterized  Result Value Ref Range Status   Specimen Description   Final    URINE, CATHETERIZED Performed at [facility], [address], [place], [place] [address]    Special Requests   Final    NONE Performed at [facility], [address] [place], [place] [address]    Culture >=100,000 COLONIES/mL ESCHERICHIA COLI (A)  Final   Report Status 06/04/2019 FINAL  Final   Organism ID, Bacteria ESCHERICHIA COLI (A)  Final      Susceptibility   Escherichia coli - MIC*  AMPICILLIN 16 INTERMEDIATE Intermediate     CEFAZOLIN <=4 SENSITIVE Sensitive     CEFTRIAXONE <=0.25 SENSITIVE Sensitive     CIPROFLOXACIN <=0.25 SENSITIVE Sensitive     GENTAMICIN <=1 SENSITIVE Sensitive     IMIPENEM <=0.25 SENSITIVE Sensitive     NITROFURANTOIN <=16 SENSITIVE Sensitive     TRIMETH/SULFA <=20 SENSITIVE Sensitive     AMPICILLIN/SULBACTAM 8 SENSITIVE Sensitive     PIP/TAZO <=4 SENSITIVE Sensitive     * >=100,000 COLONIES/mL ESCHERICHIA COLI  SARS CORONAVIRUS 2 (TAT 6-24 HRS) Nasopharyngeal Nasopharyngeal Swab     Status: Abnormal   Collection Time: 06/01/19 10:42 PM   Specimen: Nasopharyngeal Swab  Result Value Ref Range Status   SARS Coronavirus 2 POSITIVE (A) NEGATIVE Final    Comment: RESULT CALLED TO, READ BACK BY AND VERIFIED WITH: MALKA SINWANY RN.@1551  ON 1.10.21 BY TCALDWELL MT. (NOTE) SARS-CoV-2 target nucleic acids are DETECTED. The SARS-CoV-2 RNA is generally detectable in upper and lower respiratory specimens during the acute phase of infection. Positive results are indicative of the presence of SARS-CoV-2 RNA. Clinical correlation with patient history and other diagnostic information is  necessary to determine patient infection status. Positive results do not rule out bacterial infection or co-infection with other viruses.  The expected result is  Negative. Fact Sheet for Patients: SugarRoll.be Fact Sheet for Healthcare Providers: https://www.woods-mathews.com/ This test is not yet approved or cleared by the [place] FDA and  has been authorized for detection and/or diagnosis of SARS-CoV-2 by FDA under an Emergency Use Authorization (EUA). This EUA will remain  in effect (meaning this test can b e used) for the duration of the COVID-19 declaration under Section 564(b)(1) of the Act, 21 U.S.C. section 360bbb-3(b)(1), unless the authorization is terminated or revoked sooner. Performed at [facility], [place] [address], [place], [place] [address]      Radiology Studies: No results found.  Scheduled Meds: . vitamin C  500 mg Oral Daily  . dexamethasone  6 mg Oral Daily  . diltiazem  180 mg Oral Daily  . donepezil  5 mg Oral Daily  . enoxaparin (LOVENOX) injection  40 mg Subcutaneous Q24H  . feeding supplement (ENSURE ENLIVE)  237 mL Oral TID BM  . Ipratropium-Albuterol  1 puff Inhalation Q6H  . multivitamin with minerals  1 tablet Oral Daily  . rosuvastatin  5 mg Oral Daily  . sertraline  25 mg Oral Daily  . zinc sulfate  220 mg Oral Daily   Continuous Infusions: . sodium chloride 10 mL/hr at 06/04/19 1517  . cefTRIAXone (ROCEPHIN)  IV 1 g (06/03/19 2216)  . remdesivir 100 mg in NS 100 mL 100 mg (06/04/19 0910)     LOS: 3 days   Time spent: 35 minutes.  Vashti Hey, MD [facility] Pager [phone]  If 7PM-7AM, please contact night-coverage [email or web address] Password [facility] 06/04/2019, 3:52 PM   This record has been created using Dragon voice recognition software. Errors have been sought and corrected,but may not always be located. Such creation errors do not reflect on the standard of care.

## 2019-06-04 NOTE — Progress Notes (Signed)
Designated contact, [name], notified with status update. All questions addressed at time of call.

## 2019-06-05 ENCOUNTER — Encounter (INDEPENDENT_AMBULATORY_CARE_PROVIDER_SITE_OTHER): Payer: Self-pay

## 2019-06-05 DIAGNOSIS — U071 COVID-19: Principal | ICD-10-CM

## 2019-06-05 DIAGNOSIS — J1282 Pneumonia due to coronavirus disease 2019: Secondary | ICD-10-CM

## 2019-06-05 LAB — COMPREHENSIVE METABOLIC PANEL
ALT: 21 U/L (ref 0–44)
AST: 19 U/L (ref 15–41)
Albumin: 2.9 g/dL — ABNORMAL LOW (ref 3.5–5.0)
Alkaline Phosphatase: 62 U/L (ref 38–126)
Anion gap: 8 (ref 5–15)
BUN: 22 mg/dL (ref 8–23)
CO2: 26 mmol/L (ref 22–32)
Calcium: 8.2 mg/dL — ABNORMAL LOW (ref 8.9–10.3)
Chloride: 109 mmol/L (ref 98–111)
Creatinine, Ser: 0.55 mg/dL (ref 0.44–1.00)
GFR calc Af Amer: >60 mL/min (ref >60)
GFR calc non Af Amer: >60 mL/min (ref >60)
Glucose, Bld: 122 mg/dL — ABNORMAL HIGH (ref 70–99)
Potassium: 4 mmol/L (ref 3.5–5.1)
Sodium: 143 mmol/L (ref 135–145)
Total Bilirubin: 0.6 mg/dL (ref 0.3–1.2)
Total Protein: 6.7 g/dL (ref 6.5–8.1)

## 2019-06-05 LAB — CBC WITH DIFFERENTIAL/PLATELET
Abs Immature Granulocytes: 0.05 10*3/uL (ref 0.00–0.07)
Basophils Absolute: 0 10*3/uL (ref 0.0–0.1)
Basophils Relative: 0 %
Eosinophils Absolute: 0 10*3/uL (ref 0.0–0.5)
Eosinophils Relative: 0 %
HCT: 43.6 % (ref 36.0–46.0)
Hemoglobin: 14 g/dL (ref 12.0–15.0)
Immature Granulocytes: 1 %
Lymphocytes Relative: 12 %
Lymphs Abs: 0.7 10*3/uL (ref 0.7–4.0)
MCH: 29.2 pg (ref 26.0–34.0)
MCHC: 32.1 g/dL (ref 30.0–36.0)
MCV: 91 fL (ref 80.0–100.0)
Monocytes Absolute: 0.6 10*3/uL (ref 0.1–1.0)
Monocytes Relative: 10 %
Neutro Abs: 4.6 10*3/uL (ref 1.7–7.7)
Neutrophils Relative %: 77 %
Platelets: 194 10*3/uL (ref 150–400)
RBC: 4.79 MIL/uL (ref 3.87–5.11)
RDW: 14.3 % (ref 11.5–15.5)
WBC: 5.9 10*3/uL (ref 4.0–10.5)
nRBC: 0 % (ref 0.0–0.2)

## 2019-06-05 LAB — C-REACTIVE PROTEIN: CRP: 2 mg/dL — ABNORMAL HIGH (ref <1.0)

## 2019-06-05 LAB — FIBRIN DERIVATIVES D-DIMER (ARMC ONLY): Fibrin derivatives D-dimer (ARMC): 988.91 ng/mL (FEU) — ABNORMAL HIGH (ref 0.00–499.00)

## 2019-06-05 LAB — MAGNESIUM: Magnesium: 2.3 mg/dL (ref 1.7–2.4)

## 2019-06-05 LAB — PHOSPHORUS: Phosphorus: 3.5 mg/dL (ref 2.5–4.6)

## 2019-06-05 MED ORDER — GUAIFENESIN-DM 100-10 MG/5ML PO SYRP: 10.0000 mL | ORAL_SOLUTION | ORAL | 0 refills | Status: AC | PRN

## 2019-06-05 MED ORDER — ADULT MULTIVITAMIN W/MINERALS CH: 1.0000 | ORAL_TABLET | Freq: Every day | ORAL | Status: AC

## 2019-06-05 MED ORDER — DEXAMETHASONE 6 MG PO TABS: 6.0000 mg | ORAL_TABLET | Freq: Every day | ORAL | 0 refills | Status: AC

## 2019-06-05 MED ORDER — ASCORBIC ACID 500 MG PO TABS: 500.0000 mg | ORAL_TABLET | Freq: Every day | ORAL | Status: AC

## 2019-06-05 MED ORDER — ENSURE ENLIVE PO LIQD: 237.0000 mL | Freq: Three times a day (TID) | ORAL | 12 refills | Status: AC

## 2019-06-05 NOTE — Assessment & Plan Note (Signed)
Followed by neurology.   

## 2019-06-05 NOTE — Assessment & Plan Note (Signed)
Followed by pulmonary 

## 2019-06-05 NOTE — Discharge Summary (Signed)
Physician Discharge Summary  Susan [name] [id number] DOB: 13-Dec-1940 DOA: 06/01/2019  PCP: [name], MD  Admit date: 06/01/2019 Discharge date: 06/05/2019  Admitted From: Home Disposition:  Home  Recommendations for Outpatient Follow-up:  1. Follow up with PCP in 1-2 weeks 2. Please obtain BMP/CBC in one week 3. Please report to [facility] tomorrow,06/06/19, for last dose of remdesivir.  Home Health: Resume   Equipment/Devices: None, already has needed DME   Discharge Condition: Stable  CODE STATUS: Full  Diet recommendation: Regular   Brief/Interim Summary:  Susan I Fosteris a 79 y.o.femalewith medical history significant forCOPD on home O2 at 2 L, hypertension, dementia, peptic ulcer disease, disabling arthritis, dependent on motorized wheelchair,as well as history of falls in the past but who lives alone at home independently, who was brought into the emergency room because of confusion and somnolence noticed by her daughters who check in on her.She sustained a fall on the day of arrival. On arrival she was hypoxic, requiring 4 L, no radiologic evidence of any fracture, UA concerning for infection so she was started on ceftriaxone, also wheezing-admitted for COPD exacerbation. COVID-19  positive.     Discharge Diagnoses: Principal Problem:   Pneumonia due to COVID-19 virus Active Problems:   COPD with acute exacerbation ([name])   Acute on chronic respiratory failure with hypoxia (HCC)   Coronary artery disease involving native coronary artery of native heart with unstable angina pectoris (HCC)   Mixed Alzheimer's and vascular dementia ([name])   UTI (urinary tract infection)   Acute metabolic encephalopathy   Frequent falls  COPD with acute exacerbation ([name]).  Due to COVID 19 Remdesivir day 4/5 - scheduled for 5th dose at 8:30 tomorrow AM at [facility] infusion clinic Decadron 4/10 - not continued on discharge due to no hypoxia  UTI Urine c/s grew E.  Coli. She has completed 3-day course of ceftriaxone  Acute metabolic encephalopathy.  Likely acute delirium due to acute illness, with underlying component of dementia/cognitive impairment.  Likely sundown's in the evening. --Avoid benzos  Fall in the setting of history of frequent falls No evidence of acute injury on CT head and CT C-spine Multifactorial, secondary to Covid as well as chronic physical l deconditioning- dependent on motorized wheelchair . PT/OT recommends HHPT  Hypertension.   Normotensive this morning. Continue home dose of Cardizem.  CAD Continue statin. Patient does not appear to be on an antiplatelet agent or beta-blocker as an outpatient. Can be addressed as an outpatient if warranted.  Mixed Alzheimer's and vascular dementia ([name]). Continue home dose of donepezil and Zoloft.    Discharge Instructions   Discharge Instructions    Call MD for:   Complete by: As directed    Worsening shortness of breath or needing to use increasing amounts of oxygen to keep saturation > 90%   Call MD for:  extreme fatigue   Complete by: As directed    Call MD for:  temperature >100.4   Complete by: As directed    Diet - low sodium heart healthy   Complete by: As directed    Discharge instructions   Complete by: As directed    Please report to [place] tomorrow to receive last dose of remdesivir.  Monitor your oxygen level at home, use your home oxygen as needed to keep oxygen saturation > 90%.   Increase activity slowly   Complete by: As directed      Allergies as of 06/05/2019  No Known Allergies     Medication List    STOP taking these medications   cephALEXin 500 MG capsule Commonly known as: KEFLEX     TAKE these medications   ascorbic acid 500 MG tablet Commonly known as: VITAMIN C Take 1 tablet (500 mg total) by mouth daily.   budesonide-formoterol 160-4.5 MCG/ACT inhaler Commonly known as: Symbicort TAKE 2 PUFFS INTO LUNGS  TWICE A DAY What changed:   how much to take  how to take this  when to take this  additional instructions   dexamethasone 6 MG tablet Commonly known as: DECADRON Take 1 tablet (6 mg total) by mouth daily for 7 days.   diltiazem 180 MG 24 hr capsule Commonly known as: CARDIZEM CD Take 1 capsule (180 mg total) by mouth daily.   donepezil 5 MG tablet Commonly known as: ARICEPT Take 5 mg by mouth daily.   feeding supplement (ENSURE ENLIVE) Liqd Take 237 mLs by mouth 3 (three) times daily between meals.   guaiFENesin-dextromethorphan 100-10 MG/5ML syrup Commonly known as: ROBITUSSIN DM Take 10 mLs by mouth every 4 (four) hours as needed for cough.   multivitamin with minerals Tabs tablet Take 1 tablet by mouth daily.   OXYGEN Inhale 2 L into the lungs daily.   rosuvastatin 5 MG tablet Commonly known as: CRESTOR Take 1 tablet (5 mg total) by mouth daily.   tiotropium 18 MCG inhalation capsule Commonly known as: SPIRIVA Place 1 capsule (18 mcg total) into inhaler and inhale daily.   traZODone 50 MG tablet Commonly known as: DESYREL TAKE 1 & 1/2 TABLETS BY MOUTH AT [facility] NEEDED FOR SLEEP What changed:   how much to take  how to take this  when to take this  reasons to take this  additional instructions   Zoloft 25 MG tablet Generic drug: sertraline Take 25 mg by mouth daily.      Follow-up Information    [place]. Go in 1 day(s).   Why: Time of appointment to be determined.  Needs to go tomorrow for last dose of remdesivir. Contact information: [place] [phone]       [name], MD. Schedule an appointment as soon as possible for a visit in 1 week(s).   Specialty: Family Medicine Why: hospital follow-up, COVID-19 admission Contact information: [place] [address] [phone]        Minna Merritts, MD .   Specialty:  Cardiology Contact information: [place] [address] [phone]          No Known Allergies  Consultations:  Palliative Care    Procedures/Studies: CT Head Wo Contrast  Result Date: 06/01/2019 CLINICAL DATA:  Fall x2 today.  Altered mental status. EXAM: CT HEAD WITHOUT CONTRAST CT CERVICAL SPINE WITHOUT CONTRAST TECHNIQUE: Multidetector CT imaging of the head and cervical spine was performed following the standard protocol without intravenous contrast. Multiplanar CT image reconstructions of the cervical spine were also generated. COMPARISON:  None. FINDINGS: CT HEAD FINDINGS Brain: No evidence of parenchymal hemorrhage or extra-axial fluid collection. No mass lesion, mass effect, or midline shift. No CT evidence of acute infarction. Generalized cerebral volume loss. Nonspecific prominent subcortical and periventricular white matter hypodensity, most in keeping with chronic small vessel ischemic change. No ventriculomegaly. Vascular: No acute abnormality. Skull: No evidence of calvarial fracture. Sinuses/Orbits: No fluid levels. Mild mucoperiosteal thickening throughout the paranasal sinuses bilaterally.  Other: Mild partial bilateral mastoid effusions. CT CERVICAL SPINE FINDINGS Alignment: Straightening of the cervical spine. No facet subluxation. Dens is well positioned between the lateral masses of C1. Minimal 2 mm anterolisthesis at C5-6. Skull base and vertebrae: No acute fracture. No primary bone lesion or focal pathologic process. Soft tissues and spinal canal: No prevertebral edema. No visible canal hematoma. Disc levels: Marked multilevel degenerative disc disease throughout the cervical spine, most prominent at C4-5 and C5-6. Moderate bilateral cervical facet arthropathy. No significant degenerative foraminal stenosis. Upper chest: No acute abnormality. Other: No discrete thyroid nodules. No pathologically enlarged cervical nodes. IMPRESSION: 1. No evidence  of acute intracranial abnormality. No evidence of calvarial fracture. 2. Generalized cerebral volume loss and prominent chronic small vessel ischemic changes in the cerebral white matter. 3. No cervical spine fracture or facet subluxation. 4. Advanced multilevel degenerative changes in the cervical spine as detailed. Electronically Signed   By: Ilona Sorrel M.D.   On: 06/01/2019 20:34   CT Cervical Spine Wo Contrast  Result Date: 06/01/2019 CLINICAL DATA:  Fall x2 today.  Altered mental status. EXAM: CT HEAD WITHOUT CONTRAST CT CERVICAL SPINE WITHOUT CONTRAST TECHNIQUE: Multidetector CT imaging of the head and cervical spine was performed following the standard protocol without intravenous contrast. Multiplanar CT image reconstructions of the cervical spine were also generated. COMPARISON:  None. FINDINGS: CT HEAD FINDINGS Brain: No evidence of parenchymal hemorrhage or extra-axial fluid collection. No mass lesion, mass effect, or midline shift. No CT evidence of acute infarction. Generalized cerebral volume loss. Nonspecific prominent subcortical and periventricular white matter hypodensity, most in keeping with chronic small vessel ischemic change. No ventriculomegaly. Vascular: No acute abnormality. Skull: No evidence of calvarial fracture. Sinuses/Orbits: No fluid levels. Mild mucoperiosteal thickening throughout the paranasal sinuses bilaterally. Other: Mild partial bilateral mastoid effusions. CT CERVICAL SPINE FINDINGS Alignment: Straightening of the cervical spine. No facet subluxation. Dens is well positioned between the lateral masses of C1. Minimal 2 mm anterolisthesis at C5-6. Skull base and vertebrae: No acute fracture. No primary bone lesion or focal pathologic process. Soft tissues and spinal canal: No prevertebral edema. No visible canal hematoma. Disc levels: Marked multilevel degenerative disc disease throughout the cervical spine, most prominent at C4-5 and C5-6. Moderate bilateral cervical  facet arthropathy. No significant degenerative foraminal stenosis. Upper chest: No acute abnormality. Other: No discrete thyroid nodules. No pathologically enlarged cervical nodes. IMPRESSION: 1. No evidence of acute intracranial abnormality. No evidence of calvarial fracture. 2. Generalized cerebral volume loss and prominent chronic small vessel ischemic changes in the cerebral white matter. 3. No cervical spine fracture or facet subluxation. 4. Advanced multilevel degenerative changes in the cervical spine as detailed. Electronically Signed   By: Ilona Sorrel M.D.   On: 06/01/2019 20:34   US Carotid Bilateral  Result Date: 06/02/2019 CLINICAL DATA:  Altered mental status and fall. EXAM: BILATERAL CAROTID DUPLEX ULTRASOUND TECHNIQUE: Pearline Cables scale imaging, color Doppler and duplex ultrasound were performed of bilateral carotid and vertebral arteries in the neck. COMPARISON:  10/01/2017 FINDINGS: Criteria: Quantification of carotid stenosis is based on velocity parameters that correlate the residual internal carotid diameter with NASCET-based stenosis levels, using the diameter of the distal internal carotid lumen as the denominator for stenosis measurement. The following velocity measurements were obtained: RIGHT ICA: 73/17 cm/sec CCA: 123456 cm/sec SYSTOLIC ICA/CCA RATIO:  1.0 ECA: 370 cm/sec LEFT ICA: 89/15 cm/sec CCA: 0000000 cm/sec SYSTOLIC ICA/CCA RATIO:  1.2 ECA: 202 cm/sec RIGHT CAROTID ARTERY: Elevated peak systolic velocity in the  right external carotid artery. Small amount of plaque in the proximal internal and external carotid arteries. Normal waveforms and velocities in the internal carotid artery. Grayscale imaging is limited compared to the previous examination. RIGHT VERTEBRAL ARTERY: Antegrade flow and normal waveform in the right vertebral artery. LEFT CAROTID ARTERY: Left carotid arteries are patent without significant plaque. External carotid artery is patent with normal waveform. Normal waveforms  and velocities in the internal carotid artery. LEFT VERTEBRAL ARTERY: Antegrade flow and normal waveform in the left vertebral artery. IMPRESSION: 1. Evidence for at least mild atherosclerotic disease in the carotid arteries although the grayscale imaging is limited on this examination compared to the previous examination. Estimated degree of stenosis in the internal carotid arteries is less than 50% bilaterally. Probable stenosis in the right external carotid artery. 2. Patent vertebral arteries with antegrade flow. Electronically Signed   By: Markus Daft M.D.   On: 06/02/2019 10:04   DG Chest Portable 1 View  Result Date: 06/01/2019 CLINICAL DATA:  Dyspnea, fall EXAM: PORTABLE CHEST 1 VIEW COMPARISON:  09/30/2017 chest radiograph. FINDINGS: Stable cardiomediastinal silhouette with top-normal heart size. No pneumothorax. No pleural effusion. Slightly low lung volumes. No overt pulmonary edema. No acute consolidative airspace disease. No displaced fractures in the visualized chest. IMPRESSION: Slightly low lung volumes.  No active cardiopulmonary disease. Electronically Signed   By: Ilona Sorrel M.D.   On: 06/01/2019 20:52       Subjective: Patient doing well today.  States she "feels great".  States her daughter could drive her to [place] to clinic for remaining Remdesivir dose.  Denies fever/chills, worsening SOB, chest pain, N/V or other complaints.  No acute events reported.   Discharge Exam: Vitals:   06/05/19 0205 06/05/19 0834  BP:  (!) 167/68  Pulse:    Resp:    Temp:  98.2 F (36.8 C)  SpO2: 94%    Vitals:   06/04/19 1617 06/05/19 0035 06/05/19 0205 06/05/19 0834  BP: (!) 177/78 138/60  (!) 167/68  Pulse: 75 (!) 52    Resp: (!) 22 17    Temp: 98.7 F (37.1 C) 97.8 F (36.6 C)  98.2 F (36.8 C)  TempSrc: Oral   Oral  SpO2: 98% 93% 94%   Weight:      Height:        General: Pt is alert, awake, not in acute distress Cardiovascular: RRR, S1/S2 +, no rubs, no  gallops Respiratory: poor air entry bilaterally but clear, no audible wheezing, no rhonchi Abdominal: Soft, NT, ND, bowel sounds + Extremities: no edema, no cyanosis    The results of significant diagnostics from this hospitalization (including imaging, microbiology, ancillary and laboratory) are listed below for reference.     Microbiology: Recent Results (from the past 240 hour(s))  Urine culture     Status: None   Collection Time: 05/28/19  9:40 AM   Specimen: Urine  Result Value Ref Range Status   MICRO NUMBER: [id number]  Final   SPECIMEN QUALITY: Adequate  Final   Sample Source URINE  Final   STATUS: FINAL  Final   ISOLATE 1:   Final    Growth of mixed flora was isolated, suggesting probable contamination. No further testing will be performed. If clinically indicated, recollection using a method to minimize contamination, with prompt transfer to Urine Culture Transport Tube, is  recommended.   Blood culture (routine x 2)     Status: None (Preliminary result)   Collection Time: 06/01/19  7:39 PM  Specimen: BLOOD  Result Value Ref Range Status   Specimen Description BLOOD RIGHT ANTECUBITAL  Final   Special Requests   Final    BOTTLES DRAWN AEROBIC AND ANAEROBIC Blood Culture adequate volume   Culture   Final    NO GROWTH 4 DAYS Performed at [facility], [address] [place], [place] [address]    Report Status PENDING  Incomplete  Urine culture     Status: Abnormal   Collection Time: 06/01/19  8:36 PM   Specimen: Urine, Catheterized  Result Value Ref Range Status   Specimen Description   Final    URINE, CATHETERIZED Performed at [facility], [address] [place], [place] [address]    Special Requests   Final    NONE Performed at [facility], [address] [place], [place] [address]    Culture >=100,000 COLONIES/mL ESCHERICHIA COLI (A)  Final   Report Status 06/04/2019 FINAL  Final   Organism ID, Bacteria ESCHERICHIA COLI (A)   Final      Susceptibility   Escherichia coli - MIC*    AMPICILLIN 16 INTERMEDIATE Intermediate     CEFAZOLIN <=4 SENSITIVE Sensitive     CEFTRIAXONE <=0.25 SENSITIVE Sensitive     CIPROFLOXACIN <=0.25 SENSITIVE Sensitive     GENTAMICIN <=1 SENSITIVE Sensitive     IMIPENEM <=0.25 SENSITIVE Sensitive     NITROFURANTOIN <=16 SENSITIVE Sensitive     TRIMETH/SULFA <=20 SENSITIVE Sensitive     AMPICILLIN/SULBACTAM 8 SENSITIVE Sensitive     PIP/TAZO <=4 SENSITIVE Sensitive     * >=100,000 COLONIES/mL ESCHERICHIA COLI  SARS CORONAVIRUS 2 (TAT 6-24 HRS) Nasopharyngeal Nasopharyngeal Swab     Status: Abnormal   Collection Time: 06/01/19 10:42 PM   Specimen: Nasopharyngeal Swab  Result Value Ref Range Status   SARS Coronavirus 2 POSITIVE (A) NEGATIVE Final    Comment: RESULT CALLED TO, READ BACK BY AND VERIFIED WITH: MALKA SINWANY RN.@1551  ON 1.10.21 BY TCALDWELL MT. (NOTE) SARS-CoV-2 target nucleic acids are DETECTED. The SARS-CoV-2 RNA is generally detectable in upper and lower respiratory specimens during the acute phase of infection. Positive results are indicative of the presence of SARS-CoV-2 RNA. Clinical correlation with patient history and other diagnostic information is  necessary to determine patient infection status. Positive results do not rule out bacterial infection or co-infection with other viruses.  The expected result is Negative. Fact Sheet for Patients: SugarRoll.be Fact Sheet for Healthcare Providers: https://www.woods-mathews.com/ This test is not yet approved or cleared by the [place] FDA and  has been authorized for detection and/or diagnosis of SARS-CoV-2 by FDA under an Emergency Use Authorization (EUA). This EUA will remain  in effect (meaning this test can b e used) for the duration of the COVID-19 declaration under Section 564(b)(1) of the Act, 21 U.S.C. section 360bbb-3(b)(1), unless the authorization is  terminated or revoked sooner. Performed at [facility], [place] [address], [place], [place] [address]      Labs: BNP (last 3 results) Recent Labs    06/02/19 1659  BNP [id number]   Basic Metabolic Panel: Recent Labs  Lab 06/01/19 1939 06/03/19 0655 06/04/19 0415 06/05/19 0732  NA 137 141 144 143  K 3.6 3.6 3.6 4.0  CL 100 107 108 109  CO2 25 22 25 26   GLUCOSE 93 110* 106* 122*  BUN 17 21 24* 22  CREATININE 0.76 0.70 0.72 0.55  CALCIUM 8.6* 8.3* 8.5* 8.2*  MG  --  2.3 2.6* 2.3  PHOS  --  3.3 3.5 3.5   Liver Function Tests: Recent Labs  Lab 06/01/19 1939 06/03/19 0655 06/04/19 0415 06/05/19 0732  AST 23 23 22 19   ALT 17 22 21 21   ALKPHOS 89 72 77 62  BILITOT 0.9 0.6 0.8 0.6  PROT 7.6 7.4 7.3 6.7  ALBUMIN 3.8 3.4* 3.4* 2.9*   No results for input(s): LIPASE, AMYLASE in the last 168 hours. No results for input(s): AMMONIA in the last 168 hours. CBC: Recent Labs  Lab 06/01/19 1939 06/03/19 0655 06/04/19 0415 06/05/19 0732  WBC 6.3 10.2 7.5 5.9  NEUTROABS  --  8.5* 5.9 4.6  HGB 14.1 14.2 14.7 14.0  HCT 43.4 43.9 46.2* 43.6  MCV 90.2 90.3 91.5 91.0  PLT 192 195 207 194   Cardiac Enzymes: No results for input(s): CKTOTAL, CKMB, CKMBINDEX, TROPONINI in the last 168 hours. BNP: Invalid input(s): POCBNP CBG: No results for input(s): GLUCAP in the last 168 hours. D-Dimer No results for input(s): DDIMER in the last 72 hours. Hgb A1c No results for input(s): HGBA1C in the last 72 hours. Lipid Profile No results for input(s): CHOL, HDL, LDLCALC, TRIG, CHOLHDL, LDLDIRECT in the last 72 hours. Thyroid function studies No results for input(s): TSH, T4TOTAL, T3FREE, THYROIDAB in the last 72 hours.  Invalid input(s): FREET3 Anemia work up Recent Labs    06/02/19 1659  FERRITIN 171   Urinalysis    Component Value Date/Time   COLORURINE AMBER (A) 06/01/2019 1939   APPEARANCEUR HAZY (A) 06/01/2019 1939   LABSPEC 1.021 06/01/2019 1939   PHURINE 5.0  06/01/2019 1939   GLUCOSEU NEGATIVE 06/01/2019 1939   HGBUR NEGATIVE 06/01/2019 1939   HGBUR small 03/25/2009 1510   BILIRUBINUR NEGATIVE 06/01/2019 1939   BILIRUBINUR Negative 05/28/2019 0928   KETONESUR NEGATIVE 06/01/2019 1939   PROTEINUR 30 (A) 06/01/2019 1939   UROBILINOGEN 1.0 05/28/2019 0928   UROBILINOGEN 1.0 09/26/2014 1035   NITRITE POSITIVE (A) 06/01/2019 1939   LEUKOCYTESUR TRACE (A) 06/01/2019 1939   Sepsis Labs Invalid input(s): PROCALCITONIN,  WBC,  LACTICIDVEN Microbiology Recent Results (from the past 240 hour(s))  Urine culture     Status: None   Collection Time: 05/28/19  9:40 AM   Specimen: Urine  Result Value Ref Range Status   MICRO NUMBER: [id number]  Final   SPECIMEN QUALITY: Adequate  Final   Sample Source URINE  Final   STATUS: FINAL  Final   ISOLATE 1:   Final    Growth of mixed flora was isolated, suggesting probable contamination. No further testing will be performed. If clinically indicated, recollection using a method to minimize contamination, with prompt transfer to Urine Culture Transport Tube, is  recommended.   Blood culture (routine x 2)     Status: None (Preliminary result)   Collection Time: 06/01/19  7:39 PM   Specimen: BLOOD  Result Value Ref Range Status   Specimen Description BLOOD RIGHT ANTECUBITAL  Final   Special Requests   Final    BOTTLES DRAWN AEROBIC AND ANAEROBIC Blood Culture adequate volume   Culture   Final    NO GROWTH 4 DAYS Performed at [facility], [address] [place], [place] [address]    Report Status PENDING  Incomplete  Urine culture     Status: Abnormal   Collection Time: 06/01/19  8:36 PM   Specimen: Urine, Catheterized  Result Value Ref Range Status   Specimen Description   Final    URINE, CATHETERIZED Performed at [facility], [address] [place], [place] [address]    Special Requests   Final    NONE Performed at [facility], [address] [place], [place]  [address]    Culture >=100,000 COLONIES/mL ESCHERICHIA COLI (A)  Final   Report Status 06/04/2019 FINAL  Final   Organism ID, Bacteria ESCHERICHIA COLI (A)  Final      Susceptibility   Escherichia coli - MIC*    AMPICILLIN 16 INTERMEDIATE Intermediate     CEFAZOLIN <=4 SENSITIVE Sensitive     CEFTRIAXONE <=0.25 SENSITIVE Sensitive     CIPROFLOXACIN <=0.25 SENSITIVE Sensitive     GENTAMICIN <=1 SENSITIVE Sensitive     IMIPENEM <=0.25 SENSITIVE Sensitive     NITROFURANTOIN <=16 SENSITIVE Sensitive     TRIMETH/SULFA <=20 SENSITIVE Sensitive     AMPICILLIN/SULBACTAM 8 SENSITIVE Sensitive     PIP/TAZO <=4 SENSITIVE Sensitive     * >=100,000 COLONIES/mL ESCHERICHIA COLI  SARS CORONAVIRUS 2 (TAT 6-24 HRS) Nasopharyngeal Nasopharyngeal Swab     Status: Abnormal   Collection Time: 06/01/19 10:42 PM   Specimen: Nasopharyngeal Swab  Result Value Ref Range Status   SARS Coronavirus 2 POSITIVE (A) NEGATIVE Final    Comment: RESULT CALLED TO, READ BACK BY AND VERIFIED WITH: MALKA SINWANY RN.@1551  ON 1.10.21 BY TCALDWELL MT. (NOTE) SARS-CoV-2 target nucleic acids are DETECTED. The SARS-CoV-2 RNA is generally detectable in upper and lower respiratory specimens during the acute phase of infection. Positive results are indicative of the presence of SARS-CoV-2 RNA. Clinical correlation with patient history and other diagnostic information is  necessary to determine patient infection status. Positive results do not rule out bacterial infection or co-infection with other viruses.  The expected result is Negative. Fact Sheet for Patients: SugarRoll.be Fact Sheet for Healthcare Providers: https://www.woods-mathews.com/ This test is not yet approved or cleared by the [place] FDA and  has been authorized for detection and/or diagnosis of SARS-CoV-2 by FDA under an Emergency Use Authorization (EUA). This EUA will remain  in effect (meaning this test can b e  used) for the duration of the COVID-19 declaration under Section 564(b)(1) of the Act, 21 U.S.C. section 360bbb-3(b)(1), unless the authorization is terminated or revoked sooner. Performed at [facility], [place] [address], [place], [place] [address]      Time coordinating discharge: Over 30 minutes  SIGNED:   Ezekiel Slocumb, DO [facility] 06/05/2019, 9:41 AM   If 7PM-7AM, please contact night-coverage [email or web address] Password [email or web address]

## 2019-06-05 NOTE — Assessment & Plan Note (Signed)
Stable at this point  Followed by neurology.  given mixed .. on aricept now.

## 2019-06-05 NOTE — Assessment & Plan Note (Signed)
Well controlled on requip

## 2019-06-05 NOTE — Care Management Important Message (Signed)
Important Message  Patient Details  Name: Susan [name] MRN: [id number] Date of Birth: 08-Feb-1941   Medicare Important Message Given:  Yes  Patient COVID + with some dementia.  IM reviewed via phone with HCPOA, daughter [name].  [name] verbalized understanding.    Shelbie Hutching, RN 06/05/2019, 11:31 AM

## 2019-06-05 NOTE — Progress Notes (Signed)
IV removed before discharge. Educated patients daughter  Susan [name] over the phone on discharge instructions and medications. She stated that she understood and all questions were answered.

## 2019-06-05 NOTE — Assessment & Plan Note (Signed)
LDL at goal <70

## 2019-06-05 NOTE — Assessment & Plan Note (Signed)
Stable on trazodone 

## 2019-06-05 NOTE — Assessment & Plan Note (Signed)
At goal on current regimen. 

## 2019-06-05 NOTE — Assessment & Plan Note (Signed)
well controlled per pt today on zoloft

## 2019-06-05 NOTE — Discharge Instructions (Signed)
You are scheduled for an outpatient infusion of Remdesivir at 0830AM on Thursday 1/14 and Friday 1/15.  Please report to [facility] at [address].  Drive to the security guard and tell them you are here for an infusion. They will direct you to the front entrance where we will come and get you.  For questions call [phone].  Thanks

## 2019-06-05 NOTE — Progress Notes (Signed)
Patient scheduled for outpatient Remdesivir infusion at 0830AM on Thursday 1/14 and Friday 1/15. Please advise them to report to [facility] at [address].  Drive to the security guard and tell them you are here for an infusion. They will direct you to the front entrance where we will come and get you.  For questions call [phone].  Thanks

## 2019-06-05 NOTE — TOC Transition Note (Addendum)
Transition of Care [facility]) - CM/SW Discharge Note   Patient Details  Name: Susan [name] MRN: [id number] Date of Birth: 1940-08-19  [facility] CM/SW Contact:  Candie Chroman, LCSW Phone Number: 06/05/2019, 11:02 AM   Clinical Narrative: Patient has orders to discharge home today. [facility] representative is aware. Sent MD a message requesting home health orders for PT, OT, RN.     11:56 am: Home health orders are in. [facility] is aware. [facility] instructions are on AVS. No further concerns. CSW signing off.  Final next level of care: Home w Home Health Services Barriers to Discharge: Barriers Resolved   Patient Goals and CMS Choice   CMS Medicare.gov Compare Post Acute Care list provided to:: Patient Represenative (must comment)(daughter [name]) Choice offered to / list presented to : [facility] POA / Guardian(Daughter [name])  Discharge Placement                    Patient and family notified of of transfer: 06/05/19  Discharge Plan and Services   Discharge Planning Services: CM Consult Post Acute Care Choice: Home Health, Resumption of Svcs/PTA Provider                    HH Arranged: RN, PT, OT [facility]: [facility] [place] Date [place]: 06/05/19 Time [place]: [phone] Representative spoke with at [place]: [place] ([place]) Interventions     Readmission Risk Interventions No flowsheet data found.

## 2019-06-06 ENCOUNTER — Encounter (INDEPENDENT_AMBULATORY_CARE_PROVIDER_SITE_OTHER): Payer: Self-pay

## 2019-06-06 ENCOUNTER — Ambulatory Visit (HOSPITAL_COMMUNITY)
Admission: RE | Admit: 2019-06-06 | Discharge: 2019-06-06 | Disposition: A | Discharge status: Home / Self Care | Payer: Medicare HMO | Source: Ambulatory Visit | Attending: Pulmonary Disease | Admitting: Pulmonary Disease

## 2019-06-06 ENCOUNTER — Telehealth: Payer: Self-pay

## 2019-06-06 VITALS — BP 122/70 | HR 78 | Temp 98.0°F | Resp 22

## 2019-06-06 DIAGNOSIS — U071 COVID-19: Secondary | ICD-10-CM

## 2019-06-06 DIAGNOSIS — J1282 Pneumonia due to coronavirus disease 2019: Secondary | ICD-10-CM | POA: Insufficient documentation

## 2019-06-06 LAB — CULTURE, BLOOD (ROUTINE X 2)
Culture: NO GROWTH
Special Requests: ADEQUATE

## 2019-06-06 MED ORDER — FAMOTIDINE IN NACL 20-0.9 MG/50ML-% IV SOLN: 20.0000 mg | Freq: Once | INTRAVENOUS | Status: DC | PRN

## 2019-06-06 MED ORDER — SODIUM CHLORIDE 0.9 % IV SOLN
100.0000 mg | Freq: Once | INTRAVENOUS | Status: AC
  Administered 2019-06-06: 100 mg via INTRAVENOUS

## 2019-06-06 MED ORDER — ALBUTEROL SULFATE HFA 108 (90 BASE) MCG/ACT IN AERS: 2.0000 | INHALATION_SPRAY | Freq: Once | RESPIRATORY_TRACT | Status: DC | PRN

## 2019-06-06 MED ORDER — SODIUM CHLORIDE 0.9 % IV SOLN: INTRAVENOUS | Status: DC | PRN

## 2019-06-06 MED ORDER — EPINEPHRINE 0.3 MG/0.3ML IJ SOAJ: 0.3000 mg | Freq: Once | INTRAMUSCULAR | Status: DC | PRN

## 2019-06-06 MED ORDER — SODIUM CHLORIDE 0.9 % IV SOLN
INTRAVENOUS | Status: AC
  Filled 2019-06-06: qty 20

## 2019-06-06 MED ORDER — DIPHENHYDRAMINE HCL 50 MG/ML IJ SOLN: 50.0000 mg | Freq: Once | INTRAMUSCULAR | Status: DC | PRN

## 2019-06-06 MED ORDER — METHYLPREDNISOLONE SODIUM SUCC 125 MG IJ SOLR: 125.0000 mg | Freq: Once | INTRAMUSCULAR | Status: DC | PRN

## 2019-06-06 NOTE — Telephone Encounter (Signed)
Transition Care Management Follow-up Telephone Call  Date of discharge and from where: 06/05/2019, [facility]  How have you been since you were released from the hospital? Patient is doing much better. Slight wheezing still noted. No other symptoms at this time.   Any questions or concerns? No   Items Reviewed:  Did the pt receive and understand the discharge instructions provided? Yes   Medications obtained and verified? Yes   Any new allergies since your discharge? No   Dietary orders reviewed? Yes  Do you have support at home? Yes   Functional Questionnaire: (I = Independent and D = Dependent) ADLs: I  Bathing/Dressing- I  Meal Prep- I  Eating- I  Maintaining continence- I  Transferring/Ambulation- I  Managing Meds- I  Follow up appointments reviewed:   PCP Hospital f/u appt confirmed? Yes  Scheduled to see Dr. Diona Browner on 06/13/2019 @ 3:40 pm.  [facility] f/u appt confirmed? Yes  Scheduled to see cardiology on 06/12/2019 @ 2:30 pm.  Are transportation arrangements needed? No   If their condition worsens, is the pt aware to call PCP or go to the Emergency Dept.? Yes  Was the patient provided with contact information for the PCP's office or ED? Yes  Was to pt encouraged to call back with questions or concerns? Yes

## 2019-06-06 NOTE — Discharge Instructions (Signed)
10 Things You Can Do to Manage Your COVID-19 Symptoms at Home If you have possible or confirmed COVID-19: 1. Stay home from work and school. And stay away from other public places. If you must go out, avoid using any kind of public transportation, ridesharing, or taxis. 2. Monitor your symptoms carefully. If your symptoms get worse, call your healthcare provider immediately. 3. Get rest and stay hydrated. 4. If you have a medical appointment, call the healthcare provider ahead of time and tell them that you have or may have COVID-19. 5. For medical emergencies, call 911 and notify the dispatch personnel that you have or may have COVID-19. 6. Cover your cough and sneezes with a tissue or use the inside of your elbow. 7. Wash your hands often with soap and water for at least 20 seconds or clean your hands with an alcohol-based hand sanitizer that contains at least 60% alcohol. 8. As much as possible, stay in a specific room and away from other people in your home. Also, you should use a separate bathroom, if available. If you need to be around other people in or outside of the home, wear a mask. 9. Avoid sharing personal items with other people in your household, like dishes, towels, and bedding. 10. Clean all surfaces that are touched often, like counters, tabletops, and doorknobs. Use household cleaning sprays or wipes according to the label instructions. cdc.gov/coronavirus 11/21/2018 This information is not intended to replace advice given to you by your health care provider. Make sure you discuss any questions you have with your health care provider. Document Revised: 04/25/2019 Document Reviewed: 04/25/2019 Elsevier Patient Education  2020 Elsevier Inc.  

## 2019-06-06 NOTE — Progress Notes (Signed)
  Diagnosis: COVID-19  Physician: Dr. Joya Gaskins  Procedure: [facility] Med: remdesivir infusion.  Complications: No immediate complications noted.  Discharge: Discharged home   Susan [name] N Susan [name] 06/06/2019

## 2019-06-07 ENCOUNTER — Ambulatory Visit (HOSPITAL_COMMUNITY)
Admit: 2019-06-07 | Discharge: 2019-06-07 | Disposition: A | Discharge status: Home / Self Care | Payer: Medicare HMO | Attending: Pulmonary Disease | Admitting: Pulmonary Disease

## 2019-06-07 ENCOUNTER — Encounter (INDEPENDENT_AMBULATORY_CARE_PROVIDER_SITE_OTHER): Payer: Self-pay

## 2019-06-07 ENCOUNTER — Telehealth: Payer: Self-pay

## 2019-06-07 DIAGNOSIS — J1282 Pneumonia due to coronavirus disease 2019: Secondary | ICD-10-CM

## 2019-06-07 DIAGNOSIS — U071 COVID-19: Secondary | ICD-10-CM

## 2019-06-07 MED ORDER — SODIUM CHLORIDE 0.9 % IV SOLN: INTRAVENOUS | Status: DC | PRN

## 2019-06-07 MED ORDER — EPINEPHRINE 0.3 MG/0.3ML IJ SOAJ: 0.3000 mg | Freq: Once | INTRAMUSCULAR | Status: DC | PRN

## 2019-06-07 MED ORDER — DIPHENHYDRAMINE HCL 50 MG/ML IJ SOLN: 50.0000 mg | Freq: Once | INTRAMUSCULAR | Status: DC | PRN

## 2019-06-07 MED ORDER — METHYLPREDNISOLONE SODIUM SUCC 125 MG IJ SOLR: 125.0000 mg | Freq: Once | INTRAMUSCULAR | Status: DC | PRN

## 2019-06-07 MED ORDER — SODIUM CHLORIDE 0.9 % IV SOLN
INTRAVENOUS | Status: AC
  Filled 2019-06-07: qty 20

## 2019-06-07 MED ORDER — SODIUM CHLORIDE 0.9 % IV SOLN
100.0000 mg | Freq: Once | INTRAVENOUS | Status: AC
  Administered 2019-06-07: 09:00:00 100 mg via INTRAVENOUS

## 2019-06-07 MED ORDER — ALBUTEROL SULFATE HFA 108 (90 BASE) MCG/ACT IN AERS: 2.0000 | INHALATION_SPRAY | Freq: Once | RESPIRATORY_TRACT | Status: DC | PRN

## 2019-06-07 MED ORDER — FAMOTIDINE IN NACL 20-0.9 MG/50ML-% IV SOLN: 20.0000 mg | Freq: Once | INTRAVENOUS | Status: DC | PRN

## 2019-06-07 NOTE — Discharge Instructions (Signed)
10 Things You Can Do to Manage Your COVID-19 Symptoms at Home If you have possible or confirmed COVID-19: 1. Stay home from work and school. And stay away from other public places. If you must go out, avoid using any kind of public transportation, ridesharing, or taxis. 2. Monitor your symptoms carefully. If your symptoms get worse, call your healthcare provider immediately. 3. Get rest and stay hydrated. 4. If you have a medical appointment, call the healthcare provider ahead of time and tell them that you have or may have COVID-19. 5. For medical emergencies, call 911 and notify the dispatch personnel that you have or may have COVID-19. 6. Cover your cough and sneezes with a tissue or use the inside of your elbow. 7. Wash your hands often with soap and water for at least 20 seconds or clean your hands with an alcohol-based hand sanitizer that contains at least 60% alcohol. 8. As much as possible, stay in a specific room and away from other people in your home. Also, you should use a separate bathroom, if available. If you need to be around other people in or outside of the home, wear a mask. 9. Avoid sharing personal items with other people in your household, like dishes, towels, and bedding. 10. Clean all surfaces that are touched often, like counters, tabletops, and doorknobs. Use household cleaning sprays or wipes according to the label instructions. cdc.gov/coronavirus 11/21/2018 This information is not intended to replace advice given to you by your health care provider. Make sure you discuss any questions you have with your health care provider. Document Revised: 04/25/2019 Document Reviewed: 04/25/2019 Elsevier Patient Education  2020 Elsevier Inc.  

## 2019-06-07 NOTE — Progress Notes (Signed)
  Diagnosis: COVID-19  Physician: Dr. Joya Gaskins  Procedure: [facility] Med: remdesivir infusion.  Complications: No immediate complications noted.  Discharge: Discharged home   Susan [name] [name] Mark 06/07/2019

## 2019-06-07 NOTE — Telephone Encounter (Signed)
Noted  

## 2019-06-07 NOTE — Telephone Encounter (Signed)
PEC Courtesy call -   Spoke to patient's daughter, [name] - No DPR/listed as Patient's contact person - DOB/Address verified.  Daughter reports patient's appetite has gotten worse; diarrhea x 2 (earlier today) ; stomach pain and dizziness. Reviewed diarrhea and appetite  protocol with daughter. Daughter reports having Immodium already if needed. Daughter reports patient has not had a fever, shortness of breath, vomiting or cough.  Daughter verbalize understanding of protocols, no further questions.

## 2019-06-08 ENCOUNTER — Emergency Department: Payer: Medicare HMO

## 2019-06-08 ENCOUNTER — Other Ambulatory Visit: Payer: Self-pay

## 2019-06-08 ENCOUNTER — Inpatient Hospital Stay: Payer: Medicare HMO | Admitting: Anesthesiology

## 2019-06-08 ENCOUNTER — Inpatient Hospital Stay: Payer: Medicare HMO

## 2019-06-08 ENCOUNTER — Encounter: Admission: EM | Disposition: E | Payer: Self-pay | Source: Home / Self Care | Attending: Pulmonary Disease

## 2019-06-08 ENCOUNTER — Inpatient Hospital Stay
Admission: EM | Admit: 2019-06-08 | Discharge: 2019-06-24 | DRG: 853 | Disposition: E | Payer: Medicare HMO | Attending: Pulmonary Disease | Admitting: Pulmonary Disease

## 2019-06-08 ENCOUNTER — Encounter: Payer: Self-pay | Admitting: Emergency Medicine

## 2019-06-08 ENCOUNTER — Inpatient Hospital Stay: Admit: 2019-06-08 | Payer: Medicare HMO | Admitting: General Surgery

## 2019-06-08 DIAGNOSIS — U071 COVID-19: Secondary | ICD-10-CM | POA: Diagnosis present

## 2019-06-08 DIAGNOSIS — J9811 Atelectasis: Secondary | ICD-10-CM | POA: Diagnosis not present

## 2019-06-08 DIAGNOSIS — K650 Generalized (acute) peritonitis: Secondary | ICD-10-CM | POA: Diagnosis present

## 2019-06-08 DIAGNOSIS — J9621 Acute and chronic respiratory failure with hypoxia: Secondary | ICD-10-CM | POA: Diagnosis not present

## 2019-06-08 DIAGNOSIS — K668 Other specified disorders of peritoneum: Secondary | ICD-10-CM

## 2019-06-08 DIAGNOSIS — G9349 Other encephalopathy: Secondary | ICD-10-CM | POA: Diagnosis present

## 2019-06-08 DIAGNOSIS — G4700 Insomnia, unspecified: Secondary | ICD-10-CM | POA: Diagnosis present

## 2019-06-08 DIAGNOSIS — G2581 Restless legs syndrome: Secondary | ICD-10-CM | POA: Diagnosis present

## 2019-06-08 DIAGNOSIS — E861 Hypovolemia: Secondary | ICD-10-CM | POA: Diagnosis present

## 2019-06-08 DIAGNOSIS — I5189 Other ill-defined heart diseases: Secondary | ICD-10-CM | POA: Diagnosis not present

## 2019-06-08 DIAGNOSIS — I700 Atherosclerosis of aorta: Secondary | ICD-10-CM | POA: Diagnosis present

## 2019-06-08 DIAGNOSIS — E872 Acidosis: Secondary | ICD-10-CM | POA: Diagnosis present

## 2019-06-08 DIAGNOSIS — K651 Peritoneal abscess: Secondary | ICD-10-CM | POA: Diagnosis present

## 2019-06-08 DIAGNOSIS — R6521 Severe sepsis with septic shock: Secondary | ICD-10-CM | POA: Diagnosis present

## 2019-06-08 DIAGNOSIS — G9341 Metabolic encephalopathy: Secondary | ICD-10-CM | POA: Diagnosis present

## 2019-06-08 DIAGNOSIS — F0150 Vascular dementia without behavioral disturbance: Secondary | ICD-10-CM | POA: Diagnosis present

## 2019-06-08 DIAGNOSIS — R7401 Elevation of levels of liver transaminase levels: Secondary | ICD-10-CM | POA: Diagnosis present

## 2019-06-08 DIAGNOSIS — A419 Sepsis, unspecified organism: Principal | ICD-10-CM | POA: Diagnosis present

## 2019-06-08 DIAGNOSIS — N179 Acute kidney failure, unspecified: Secondary | ICD-10-CM | POA: Diagnosis present

## 2019-06-08 DIAGNOSIS — Z7189 Other specified counseling: Secondary | ICD-10-CM

## 2019-06-08 DIAGNOSIS — Z9911 Dependence on respirator [ventilator] status: Secondary | ICD-10-CM | POA: Diagnosis not present

## 2019-06-08 DIAGNOSIS — J440 Chronic obstructive pulmonary disease with acute lower respiratory infection: Secondary | ICD-10-CM | POA: Diagnosis present

## 2019-06-08 DIAGNOSIS — Z4659 Encounter for fitting and adjustment of other gastrointestinal appliance and device: Secondary | ICD-10-CM

## 2019-06-08 DIAGNOSIS — Z8249 Family history of ischemic heart disease and other diseases of the circulatory system: Secondary | ICD-10-CM

## 2019-06-08 DIAGNOSIS — R579 Shock, unspecified: Secondary | ICD-10-CM | POA: Diagnosis not present

## 2019-06-08 DIAGNOSIS — E7800 Pure hypercholesterolemia, unspecified: Secondary | ICD-10-CM | POA: Diagnosis present

## 2019-06-08 DIAGNOSIS — L8990 Pressure ulcer of unspecified site, unspecified stage: Secondary | ICD-10-CM | POA: Insufficient documentation

## 2019-06-08 DIAGNOSIS — I25110 Atherosclerotic heart disease of native coronary artery with unstable angina pectoris: Secondary | ICD-10-CM | POA: Diagnosis present

## 2019-06-08 DIAGNOSIS — J9622 Acute and chronic respiratory failure with hypercapnia: Secondary | ICD-10-CM | POA: Diagnosis present

## 2019-06-08 DIAGNOSIS — Z515 Encounter for palliative care: Secondary | ICD-10-CM | POA: Diagnosis not present

## 2019-06-08 DIAGNOSIS — Z87891 Personal history of nicotine dependence: Secondary | ICD-10-CM

## 2019-06-08 DIAGNOSIS — Z7951 Long term (current) use of inhaled steroids: Secondary | ICD-10-CM

## 2019-06-08 DIAGNOSIS — G309 Alzheimer's disease, unspecified: Secondary | ICD-10-CM | POA: Diagnosis present

## 2019-06-08 DIAGNOSIS — R4182 Altered mental status, unspecified: Secondary | ICD-10-CM

## 2019-06-08 DIAGNOSIS — J1282 Pneumonia due to coronavirus disease 2019: Secondary | ICD-10-CM | POA: Diagnosis present

## 2019-06-08 DIAGNOSIS — I680 Cerebral amyloid angiopathy: Secondary | ICD-10-CM | POA: Diagnosis present

## 2019-06-08 DIAGNOSIS — J9600 Acute respiratory failure, unspecified whether with hypoxia or hypercapnia: Secondary | ICD-10-CM

## 2019-06-08 DIAGNOSIS — K5792 Diverticulitis of intestine, part unspecified, without perforation or abscess without bleeding: Secondary | ICD-10-CM

## 2019-06-08 DIAGNOSIS — I63512 Cerebral infarction due to unspecified occlusion or stenosis of left middle cerebral artery: Secondary | ICD-10-CM | POA: Diagnosis not present

## 2019-06-08 DIAGNOSIS — R911 Solitary pulmonary nodule: Secondary | ICD-10-CM | POA: Diagnosis present

## 2019-06-08 DIAGNOSIS — Z993 Dependence on wheelchair: Secondary | ICD-10-CM

## 2019-06-08 DIAGNOSIS — G8918 Other acute postprocedural pain: Secondary | ICD-10-CM | POA: Diagnosis not present

## 2019-06-08 DIAGNOSIS — I639 Cerebral infarction, unspecified: Secondary | ICD-10-CM | POA: Diagnosis not present

## 2019-06-08 DIAGNOSIS — B009 Herpesviral infection, unspecified: Secondary | ICD-10-CM | POA: Diagnosis present

## 2019-06-08 DIAGNOSIS — E854 Organ-limited amyloidosis: Secondary | ICD-10-CM | POA: Diagnosis present

## 2019-06-08 DIAGNOSIS — R739 Hyperglycemia, unspecified: Secondary | ICD-10-CM | POA: Diagnosis not present

## 2019-06-08 DIAGNOSIS — K631 Perforation of intestine (nontraumatic): Secondary | ICD-10-CM | POA: Diagnosis present

## 2019-06-08 DIAGNOSIS — Z6839 Body mass index (BMI) 39.0-39.9, adult: Secondary | ICD-10-CM

## 2019-06-08 DIAGNOSIS — T380X5A Adverse effect of glucocorticoids and synthetic analogues, initial encounter: Secondary | ICD-10-CM | POA: Diagnosis not present

## 2019-06-08 DIAGNOSIS — F0280 Dementia in other diseases classified elsewhere without behavioral disturbance: Secondary | ICD-10-CM | POA: Diagnosis present

## 2019-06-08 DIAGNOSIS — R29720 NIHSS score 20: Secondary | ICD-10-CM | POA: Diagnosis not present

## 2019-06-08 DIAGNOSIS — K5720 Diverticulitis of large intestine with perforation and abscess without bleeding: Secondary | ICD-10-CM | POA: Diagnosis present

## 2019-06-08 DIAGNOSIS — Z66 Do not resuscitate: Secondary | ICD-10-CM | POA: Diagnosis present

## 2019-06-08 DIAGNOSIS — Z96653 Presence of artificial knee joint, bilateral: Secondary | ICD-10-CM | POA: Diagnosis present

## 2019-06-08 DIAGNOSIS — K439 Ventral hernia without obstruction or gangrene: Secondary | ICD-10-CM | POA: Diagnosis present

## 2019-06-08 DIAGNOSIS — E782 Mixed hyperlipidemia: Secondary | ICD-10-CM | POA: Diagnosis present

## 2019-06-08 DIAGNOSIS — F411 Generalized anxiety disorder: Secondary | ICD-10-CM | POA: Diagnosis present

## 2019-06-08 DIAGNOSIS — I5032 Chronic diastolic (congestive) heart failure: Secondary | ICD-10-CM | POA: Diagnosis present

## 2019-06-08 DIAGNOSIS — Z79891 Long term (current) use of opiate analgesic: Secondary | ICD-10-CM

## 2019-06-08 DIAGNOSIS — Z9981 Dependence on supplemental oxygen: Secondary | ICD-10-CM

## 2019-06-08 DIAGNOSIS — Z8673 Personal history of transient ischemic attack (TIA), and cerebral infarction without residual deficits: Secondary | ICD-10-CM

## 2019-06-08 DIAGNOSIS — Z79899 Other long term (current) drug therapy: Secondary | ICD-10-CM

## 2019-06-08 DIAGNOSIS — Z8711 Personal history of peptic ulcer disease: Secondary | ICD-10-CM

## 2019-06-08 DIAGNOSIS — Z978 Presence of other specified devices: Secondary | ICD-10-CM

## 2019-06-08 DIAGNOSIS — R627 Adult failure to thrive: Secondary | ICD-10-CM | POA: Diagnosis present

## 2019-06-08 DIAGNOSIS — R001 Bradycardia, unspecified: Secondary | ICD-10-CM | POA: Diagnosis not present

## 2019-06-08 DIAGNOSIS — Z96643 Presence of artificial hip joint, bilateral: Secondary | ICD-10-CM | POA: Diagnosis present

## 2019-06-08 DIAGNOSIS — J309 Allergic rhinitis, unspecified: Secondary | ICD-10-CM | POA: Diagnosis present

## 2019-06-08 DIAGNOSIS — R296 Repeated falls: Secondary | ICD-10-CM | POA: Diagnosis present

## 2019-06-08 DIAGNOSIS — K219 Gastro-esophageal reflux disease without esophagitis: Secondary | ICD-10-CM | POA: Diagnosis present

## 2019-06-08 DIAGNOSIS — Z452 Encounter for adjustment and management of vascular access device: Secondary | ICD-10-CM

## 2019-06-08 DIAGNOSIS — G4733 Obstructive sleep apnea (adult) (pediatric): Secondary | ICD-10-CM | POA: Diagnosis present

## 2019-06-08 HISTORY — PX: COLOSTOMY: SHX63

## 2019-06-08 HISTORY — PX: APPLICATION OF WOUND VAC: SHX5189

## 2019-06-08 HISTORY — PX: LAPAROTOMY: SHX154

## 2019-06-08 LAB — COMPREHENSIVE METABOLIC PANEL
ALT: 33 U/L (ref 0–44)
ALT: 60 U/L — ABNORMAL HIGH (ref 0–44)
AST: 21 U/L (ref 15–41)
AST: 73 U/L — ABNORMAL HIGH (ref 15–41)
Albumin: 2.8 g/dL — ABNORMAL LOW (ref 3.5–5.0)
Albumin: 2.3 g/dL — ABNORMAL LOW (ref 3.5–5.0)
Alkaline Phosphatase: 70 U/L (ref 38–126)
Alkaline Phosphatase: 53 U/L (ref 38–126)
Anion gap: 13 (ref 5–15)
Anion gap: 7 (ref 5–15)
BUN: 36 mg/dL — ABNORMAL HIGH (ref 8–23)
BUN: 42 mg/dL — ABNORMAL HIGH (ref 8–23)
CO2: 22 mmol/L (ref 22–32)
CO2: 22 mmol/L (ref 22–32)
Calcium: 8.1 mg/dL — ABNORMAL LOW (ref 8.9–10.3)
Calcium: 7.3 mg/dL — ABNORMAL LOW (ref 8.9–10.3)
Chloride: 104 mmol/L (ref 98–111)
Chloride: 108 mmol/L (ref 98–111)
Creatinine, Ser: 1.21 mg/dL — ABNORMAL HIGH (ref 0.44–1.00)
Creatinine, Ser: 1.71 mg/dL — ABNORMAL HIGH (ref 0.44–1.00)
GFR calc Af Amer: 50 mL/min — ABNORMAL LOW (ref >60)
GFR calc Af Amer: 33 mL/min — ABNORMAL LOW (ref >60)
GFR calc non Af Amer: 43 mL/min — ABNORMAL LOW (ref >60)
GFR calc non Af Amer: 28 mL/min — ABNORMAL LOW (ref >60)
Glucose, Bld: 216 mg/dL — ABNORMAL HIGH (ref 70–99)
Glucose, Bld: 213 mg/dL — ABNORMAL HIGH (ref 70–99)
Potassium: 4.6 mmol/L (ref 3.5–5.1)
Potassium: 5 mmol/L (ref 3.5–5.1)
Sodium: 139 mmol/L (ref 135–145)
Sodium: 137 mmol/L (ref 135–145)
Total Bilirubin: 1.1 mg/dL (ref 0.3–1.2)
Total Bilirubin: 1.2 mg/dL (ref 0.3–1.2)
Total Protein: 6.5 g/dL (ref 6.5–8.1)
Total Protein: 5.3 g/dL — ABNORMAL LOW (ref 6.5–8.1)

## 2019-06-08 LAB — CBC WITH DIFFERENTIAL/PLATELET
Abs Immature Granulocytes: 0.18 10*3/uL — ABNORMAL HIGH (ref 0.00–0.07)
Abs Immature Granulocytes: 0 10*3/uL (ref 0.00–0.07)
Band Neutrophils: 27 %
Basophils Absolute: 0.1 10*3/uL (ref 0.0–0.1)
Basophils Absolute: 0 10*3/uL (ref 0.0–0.1)
Basophils Relative: 1 %
Basophils Relative: 0 %
Eosinophils Absolute: 0.3 10*3/uL (ref 0.0–0.5)
Eosinophils Absolute: 0 10*3/uL (ref 0.0–0.5)
Eosinophils Relative: 2 %
Eosinophils Relative: 0 %
HCT: 49.1 % — ABNORMAL HIGH (ref 36.0–46.0)
HCT: 47.2 % — ABNORMAL HIGH (ref 36.0–46.0)
Hemoglobin: 16.1 g/dL — ABNORMAL HIGH (ref 12.0–15.0)
Hemoglobin: 14.8 g/dL (ref 12.0–15.0)
Immature Granulocytes: 1 %
Lymphocytes Relative: 4 %
Lymphocytes Relative: 2 %
Lymphs Abs: 0.6 10*3/uL — ABNORMAL LOW (ref 0.7–4.0)
Lymphs Abs: 0.3 10*3/uL — ABNORMAL LOW (ref 0.7–4.0)
MCH: 29.2 pg (ref 26.0–34.0)
MCH: 29.3 pg (ref 26.0–34.0)
MCHC: 32.8 g/dL (ref 30.0–36.0)
MCHC: 31.4 g/dL (ref 30.0–36.0)
MCV: 89.1 fL (ref 80.0–100.0)
MCV: 93.5 fL (ref 80.0–100.0)
Monocytes Absolute: 0.6 10*3/uL (ref 0.1–1.0)
Monocytes Absolute: 0.2 10*3/uL (ref 0.1–1.0)
Monocytes Relative: 4 %
Monocytes Relative: 1 %
Neutro Abs: 12.2 10*3/uL — ABNORMAL HIGH (ref 1.7–7.7)
Neutro Abs: 15.9 10*3/uL — ABNORMAL HIGH (ref 1.7–7.7)
Neutrophils Relative %: 88 %
Neutrophils Relative %: 70 %
Platelets: 237 10*3/uL (ref 150–400)
Platelets: 217 10*3/uL (ref 150–400)
RBC: 5.51 MIL/uL — ABNORMAL HIGH (ref 3.87–5.11)
RBC: 5.05 MIL/uL (ref 3.87–5.11)
RDW: 15.2 % (ref 11.5–15.5)
RDW: 15.9 % — ABNORMAL HIGH (ref 11.5–15.5)
WBC: 13.9 10*3/uL — ABNORMAL HIGH (ref 4.0–10.5)
WBC: 16.4 10*3/uL — ABNORMAL HIGH (ref 4.0–10.5)
nRBC: 0 % (ref 0.0–0.2)
nRBC: 0.1 % (ref 0.0–0.2)

## 2019-06-08 LAB — BLOOD GAS, ARTERIAL
Acid-Base Excess: 5.3 mmol/L — ABNORMAL HIGH (ref 0.0–2.0)
Allens test (pass/fail): POSITIVE — AB
Bicarbonate: 22 mmol/L (ref 20.0–28.0)
FIO2: 50
MECHVT: 450 mL
PEEP: 10 cmH2O
Patient temperature: 37
RATE: 16 resp/min
pCO2 arterial: 49 mmHg — ABNORMAL HIGH (ref 32.0–48.0)
pH, Arterial: 7.26 — ABNORMAL LOW (ref 7.350–7.450)
pO2, Arterial: 106 mmHg (ref 83.0–108.0)

## 2019-06-08 LAB — URINALYSIS, ROUTINE W REFLEX MICROSCOPIC
Bilirubin Urine: NEGATIVE
Glucose, UA: NEGATIVE mg/dL
Hgb urine dipstick: NEGATIVE
Ketones, ur: NEGATIVE mg/dL
Leukocytes,Ua: NEGATIVE
Nitrite: NEGATIVE
Protein, ur: NEGATIVE mg/dL
Specific Gravity, Urine: 1.023 (ref 1.005–1.030)
pH: 6 (ref 5.0–8.0)

## 2019-06-08 LAB — PROCALCITONIN: Procalcitonin: 3.19 ng/mL

## 2019-06-08 LAB — LACTIC ACID, PLASMA
Lactic Acid, Venous: 2.3 mmol/L (ref 0.5–1.9)
Lactic Acid, Venous: 3.3 mmol/L (ref 0.5–1.9)
Lactic Acid, Venous: 2.3 mmol/L (ref 0.5–1.9)
Lactic Acid, Venous: 2 mmol/L (ref 0.5–1.9)

## 2019-06-08 LAB — PROTIME-INR
INR: 1.3 — ABNORMAL HIGH (ref 0.8–1.2)
Prothrombin Time: 15.7 seconds — ABNORMAL HIGH (ref 11.4–15.2)

## 2019-06-08 LAB — FERRITIN: Ferritin: 530 ng/mL — ABNORMAL HIGH (ref 11–307)

## 2019-06-08 LAB — BRAIN NATRIURETIC PEPTIDE: B Natriuretic Peptide: 160 pg/mL — ABNORMAL HIGH (ref 0.0–100.0)

## 2019-06-08 LAB — FIBRIN DERIVATIVES D-DIMER (ARMC ONLY): Fibrin derivatives D-dimer (ARMC): 4466.89 ng/mL (FEU) — ABNORMAL HIGH (ref 0.00–499.00)

## 2019-06-08 LAB — APTT: aPTT: 28 seconds (ref 24–36)

## 2019-06-08 LAB — MRSA PCR SCREENING: MRSA by PCR: NEGATIVE

## 2019-06-08 LAB — GLUCOSE, CAPILLARY: Glucose-Capillary: 243 mg/dL — ABNORMAL HIGH (ref 70–99)

## 2019-06-08 SURGERY — LAPAROTOMY, EXPLORATORY
Anesthesia: General | Site: Abdomen

## 2019-06-08 MED ORDER — ETOMIDATE 2 MG/ML IV SOLN
INTRAVENOUS | Status: DC | PRN
  Administered 2019-06-08: 20 mg via INTRAVENOUS

## 2019-06-08 MED ORDER — IOHEXOL 300 MG/ML  SOLN
75.0000 mL | Freq: Once | INTRAMUSCULAR | Status: AC | PRN
  Administered 2019-06-08: 75 mL via INTRAVENOUS

## 2019-06-08 MED ORDER — SODIUM CHLORIDE 0.9 % IV SOLN: INTRAVENOUS | Status: DC

## 2019-06-08 MED ORDER — MORPHINE SULFATE (PF) 4 MG/ML IV SOLN: 4.0000 mg | INTRAVENOUS | Status: DC | PRN

## 2019-06-08 MED ORDER — SODIUM CHLORIDE 0.9 % IV BOLUS
1000.0000 mL | Freq: Once | INTRAVENOUS | Status: AC
  Administered 2019-06-08: 1000 mL via INTRAVENOUS

## 2019-06-08 MED ORDER — ALBUTEROL SULFATE HFA 108 (90 BASE) MCG/ACT IN AERS
2.0000 | INHALATION_SPRAY | RESPIRATORY_TRACT | Status: DC | PRN
  Filled 2019-06-08: qty 6.7

## 2019-06-08 MED ORDER — FENTANYL BOLUS VIA INFUSION
25.0000 ug | INTRAVENOUS | Status: DC | PRN
  Administered 2019-06-09 – 2019-06-12 (×4): 25 ug via INTRAVENOUS
  Filled 2019-06-08: qty 25

## 2019-06-08 MED ORDER — ORAL CARE MOUTH RINSE
15.0000 mL | OROMUCOSAL | Status: DC
  Administered 2019-06-08 – 2019-06-14 (×51): 15 mL via OROMUCOSAL

## 2019-06-08 MED ORDER — PIPERACILLIN-TAZOBACTAM 3.375 G IVPB 30 MIN
3.3750 g | Freq: Once | INTRAVENOUS | Status: AC
  Administered 2019-06-08: 3.375 g via INTRAVENOUS
  Filled 2019-06-08: qty 50

## 2019-06-08 MED ORDER — PHENYLEPHRINE HCL (PRESSORS) 10 MG/ML IV SOLN
INTRAVENOUS | Status: DC | PRN
  Administered 2019-06-08 (×5): 100 ug via INTRAVENOUS
  Administered 2019-06-08: 200 ug via INTRAVENOUS
  Administered 2019-06-08 (×3): 100 ug via INTRAVENOUS

## 2019-06-08 MED ORDER — FENTANYL CITRATE (PF) 100 MCG/2ML IJ SOLN
INTRAMUSCULAR | Status: AC
  Filled 2019-06-08: qty 2

## 2019-06-08 MED ORDER — LACTATED RINGERS IV SOLN: INTRAVENOUS | Status: DC | PRN

## 2019-06-08 MED ORDER — DONEPEZIL HCL 5 MG PO TABS: 5.0000 mg | ORAL_TABLET | Freq: Every day | ORAL | Status: DC

## 2019-06-08 MED ORDER — LACTATED RINGERS IV BOLUS
1000.0000 mL | Freq: Once | INTRAVENOUS | Status: AC
  Administered 2019-06-08: 1000 mL via INTRAVENOUS

## 2019-06-08 MED ORDER — TRAZODONE HCL 50 MG PO TABS: 75.0000 mg | ORAL_TABLET | Freq: Every evening | ORAL | Status: DC | PRN

## 2019-06-08 MED ORDER — MOMETASONE FURO-FORMOTEROL FUM 200-5 MCG/ACT IN AERO
2.0000 | INHALATION_SPRAY | Freq: Two times a day (BID) | RESPIRATORY_TRACT | Status: DC
  Filled 2019-06-08: qty 8.8

## 2019-06-08 MED ORDER — METHYLPREDNISOLONE SODIUM SUCC 125 MG IJ SOLR
40.0000 mg | Freq: Two times a day (BID) | INTRAMUSCULAR | Status: DC
  Administered 2019-06-08 – 2019-06-09 (×2): 40 mg via INTRAVENOUS
  Filled 2019-06-08 (×2): qty 2

## 2019-06-08 MED ORDER — ONDANSETRON HCL 4 MG/2ML IJ SOLN
INTRAMUSCULAR | Status: AC
  Filled 2019-06-08: qty 2

## 2019-06-08 MED ORDER — SUCCINYLCHOLINE CHLORIDE 20 MG/ML IJ SOLN
INTRAMUSCULAR | Status: DC | PRN
  Administered 2019-06-08: 200 mg via INTRAVENOUS

## 2019-06-08 MED ORDER — PROPOFOL 10 MG/ML IV BOLUS
INTRAVENOUS | Status: DC | PRN
  Administered 2019-06-08: 10 ug/kg/min via INTRAVENOUS

## 2019-06-08 MED ORDER — ETOMIDATE 2 MG/ML IV SOLN
INTRAVENOUS | Status: AC
  Filled 2019-06-08: qty 10

## 2019-06-08 MED ORDER — HYDRALAZINE HCL 20 MG/ML IJ SOLN
5.0000 mg | INTRAMUSCULAR | Status: DC | PRN
  Filled 2019-06-08: qty 0.25

## 2019-06-08 MED ORDER — ROCURONIUM BROMIDE 100 MG/10ML IV SOLN
INTRAVENOUS | Status: DC | PRN
  Administered 2019-06-08: 20 mg via INTRAVENOUS
  Administered 2019-06-08 (×2): 50 mg via INTRAVENOUS
  Administered 2019-06-08: 30 mg via INTRAVENOUS

## 2019-06-08 MED ORDER — LIDOCAINE HCL (CARDIAC) PF 100 MG/5ML IV SOSY
PREFILLED_SYRINGE | INTRAVENOUS | Status: DC | PRN
  Administered 2019-06-08: 100 mg via INTRAVENOUS

## 2019-06-08 MED ORDER — FENTANYL 2500MCG IN NS 250ML (10MCG/ML) PREMIX INFUSION
0.0000 ug/h | INTRAVENOUS | Status: DC
  Administered 2019-06-08: 50 ug/h via INTRAVENOUS
  Administered 2019-06-09 – 2019-06-10 (×2): 200 ug/h via INTRAVENOUS
  Administered 2019-06-11: 75 ug/h via INTRAVENOUS
  Administered 2019-06-12: 25 ug/h via INTRAVENOUS
  Administered 2019-06-13: 200 ug/h via INTRAVENOUS
  Administered 2019-06-13: 150 ug/h via INTRAVENOUS
  Administered 2019-06-14: 200 ug/h via INTRAVENOUS
  Administered 2019-06-14: 400 ug/h via INTRAVENOUS
  Filled 2019-06-08 (×8): qty 250

## 2019-06-08 MED ORDER — FENTANYL CITRATE (PF) 100 MCG/2ML IJ SOLN
INTRAMUSCULAR | Status: DC | PRN
  Administered 2019-06-08: 50 ug via INTRAVENOUS
  Administered 2019-06-08: 25 ug via INTRAVENOUS
  Administered 2019-06-08: 50 ug via INTRAVENOUS
  Administered 2019-06-08: 25 ug via INTRAVENOUS
  Administered 2019-06-08: 50 ug via INTRAVENOUS

## 2019-06-08 MED ORDER — LIDOCAINE HCL (PF) 2 % IJ SOLN
INTRAMUSCULAR | Status: AC
  Filled 2019-06-08: qty 10

## 2019-06-08 MED ORDER — TIOTROPIUM BROMIDE MONOHYDRATE 18 MCG IN CAPS
18.0000 ug | ORAL_CAPSULE | Freq: Every day | RESPIRATORY_TRACT | Status: DC
  Filled 2019-06-08: qty 5

## 2019-06-08 MED ORDER — ONDANSETRON HCL 4 MG/2ML IJ SOLN: 4.0000 mg | Freq: Three times a day (TID) | INTRAMUSCULAR | Status: DC | PRN

## 2019-06-08 MED ORDER — SUCCINYLCHOLINE CHLORIDE 20 MG/ML IJ SOLN
INTRAMUSCULAR | Status: AC
  Filled 2019-06-08: qty 1

## 2019-06-08 MED ORDER — PROPOFOL 1000 MG/100ML IV EMUL
INTRAVENOUS | Status: AC
  Filled 2019-06-08: qty 100

## 2019-06-08 MED ORDER — PROPOFOL 1000 MG/100ML IV EMUL
0.0000 ug/kg/min | INTRAVENOUS | Status: DC
  Administered 2019-06-08: 20 ug/kg/min via INTRAVENOUS
  Administered 2019-06-09: 30 ug/kg/min via INTRAVENOUS
  Filled 2019-06-08 (×3): qty 100

## 2019-06-08 MED ORDER — PROPOFOL 10 MG/ML IV BOLUS
INTRAVENOUS | Status: AC
  Filled 2019-06-08: qty 20

## 2019-06-08 MED ORDER — ACETAMINOPHEN 325 MG PO TABS
650.0000 mg | ORAL_TABLET | Freq: Four times a day (QID) | ORAL | Status: DC | PRN
  Administered 2019-06-14: 650 mg via ORAL
  Filled 2019-06-08: qty 2

## 2019-06-08 MED ORDER — FENTANYL CITRATE (PF) 100 MCG/2ML IJ SOLN: 25.0000 ug | Freq: Once | INTRAMUSCULAR | Status: DC

## 2019-06-08 MED ORDER — ENOXAPARIN SODIUM 40 MG/0.4ML ~~LOC~~ SOLN: 40.0000 mg | SUBCUTANEOUS | Status: DC

## 2019-06-08 MED ORDER — CHLORHEXIDINE GLUCONATE 0.12% ORAL RINSE (MEDLINE KIT)
15.0000 mL | Freq: Two times a day (BID) | OROMUCOSAL | Status: DC
  Administered 2019-06-08 – 2019-06-14 (×12): 15 mL via OROMUCOSAL

## 2019-06-08 MED ORDER — DEXAMETHASONE SODIUM PHOSPHATE 10 MG/ML IJ SOLN
INTRAMUSCULAR | Status: AC
  Filled 2019-06-08: qty 1

## 2019-06-08 MED ORDER — INSULIN ASPART 100 UNIT/ML ~~LOC~~ SOLN
0.0000 [IU] | SUBCUTANEOUS | Status: DC
  Administered 2019-06-09: 2 [IU] via SUBCUTANEOUS
  Administered 2019-06-09 (×2): 1 [IU] via SUBCUTANEOUS
  Administered 2019-06-09 (×2): 2 [IU] via SUBCUTANEOUS
  Administered 2019-06-10 – 2019-06-11 (×7): 1 [IU] via SUBCUTANEOUS
  Administered 2019-06-11 – 2019-06-12 (×2): 2 [IU] via SUBCUTANEOUS
  Administered 2019-06-13 (×6): 1 [IU] via SUBCUTANEOUS
  Administered 2019-06-14 (×2): 2 [IU] via SUBCUTANEOUS
  Filled 2019-06-08 (×23): qty 1

## 2019-06-08 MED ORDER — ONDANSETRON HCL 4 MG/2ML IJ SOLN: 4.0000 mg | Freq: Four times a day (QID) | INTRAMUSCULAR | Status: DC | PRN

## 2019-06-08 MED ORDER — SODIUM CHLORIDE 0.9 % IV SOLN: INTRAVENOUS | Status: DC | PRN

## 2019-06-08 MED ORDER — ONDANSETRON HCL 4 MG/2ML IJ SOLN
INTRAMUSCULAR | Status: DC | PRN
  Administered 2019-06-08: 4 mg via INTRAVENOUS

## 2019-06-08 MED ORDER — ROCURONIUM BROMIDE 50 MG/5ML IV SOLN
INTRAVENOUS | Status: AC
  Filled 2019-06-08: qty 1

## 2019-06-08 MED ORDER — DILTIAZEM HCL ER COATED BEADS 180 MG PO CP24: 180.0000 mg | ORAL_CAPSULE | Freq: Every day | ORAL | Status: DC

## 2019-06-08 MED ORDER — ONDANSETRON 4 MG PO TBDP
4.0000 mg | ORAL_TABLET | Freq: Four times a day (QID) | ORAL | Status: DC | PRN
  Filled 2019-06-08: qty 1

## 2019-06-08 MED ORDER — ROCURONIUM BROMIDE 50 MG/5ML IV SOLN
INTRAVENOUS | Status: AC
  Filled 2019-06-08: qty 2

## 2019-06-08 MED ORDER — PROPOFOL 500 MG/50ML IV EMUL
INTRAVENOUS | Status: AC
  Filled 2019-06-08: qty 50

## 2019-06-08 MED ORDER — ACETAMINOPHEN 650 MG RE SUPP: 650.0000 mg | Freq: Four times a day (QID) | RECTAL | Status: DC | PRN

## 2019-06-08 MED ORDER — PANTOPRAZOLE SODIUM 40 MG IV SOLR
40.0000 mg | Freq: Every day | INTRAVENOUS | Status: DC
  Administered 2019-06-08: 40 mg via INTRAVENOUS
  Filled 2019-06-08: qty 40

## 2019-06-08 MED ORDER — PIPERACILLIN-TAZOBACTAM 3.375 G IVPB
3.3750 g | Freq: Three times a day (TID) | INTRAVENOUS | Status: DC
  Administered 2019-06-08 – 2019-06-14 (×17): 3.375 g via INTRAVENOUS
  Filled 2019-06-08 (×17): qty 50

## 2019-06-08 MED ORDER — CHLORHEXIDINE GLUCONATE CLOTH 2 % EX PADS
6.0000 | MEDICATED_PAD | Freq: Every day | CUTANEOUS | Status: DC
  Administered 2019-06-09 – 2019-06-12 (×3): 6 via TOPICAL

## 2019-06-08 MED ORDER — DEXAMETHASONE SODIUM PHOSPHATE 10 MG/ML IJ SOLN
INTRAMUSCULAR | Status: DC | PRN
  Administered 2019-06-08: 10 mg via INTRAVENOUS

## 2019-06-08 SURGICAL SUPPLY — 77 items
APPLIER CLIP ROT 10 11.4 M/L (STAPLE)
BAG COUNTER SPONGE EZ (MISCELLANEOUS) IMPLANT
BLADE CLIPPER SURG (BLADE) ×2 IMPLANT
BULB RESERV EVAC DRAIN JP 100C (MISCELLANEOUS) ×2 IMPLANT
CANISTER SUCT 1200ML W/VALVE (MISCELLANEOUS) ×2 IMPLANT
CANISTER WOUND CARE 500ML ATS (WOUND CARE) ×2 IMPLANT
CANNULA DILATOR 10 W/SLV (CANNULA) IMPLANT
CHLORAPREP W/TINT 26 (MISCELLANEOUS) ×2 IMPLANT
CLIP APPLIE ROT 10 11.4 M/L (STAPLE) IMPLANT
COVER BACK TABLE REUSABLE LG (DRAPES) ×2 IMPLANT
COVER CLAMP SIL LG PBX B (MISCELLANEOUS) IMPLANT
COVER LIGHT HANDLE STERIS (MISCELLANEOUS) ×2 IMPLANT
COVER WAND RF STERILE (DRAPES) ×2 IMPLANT
DRAIN CHANNEL JP 19F (MISCELLANEOUS) ×2 IMPLANT
DRAPE LAPAROTOMY 100X77 ABD (DRAPES) ×2 IMPLANT
DRSG OPSITE POSTOP 4X10 (GAUZE/BANDAGES/DRESSINGS) ×2 IMPLANT
DRSG OPSITE POSTOP 4X8 (GAUZE/BANDAGES/DRESSINGS) IMPLANT
DRSG TEGADERM 2-3/8X2-3/4 SM (GAUZE/BANDAGES/DRESSINGS) IMPLANT
DRSG TEGADERM 4X10 (GAUZE/BANDAGES/DRESSINGS) IMPLANT
DRSG TEGADERM 4X4.75 (GAUZE/BANDAGES/DRESSINGS) IMPLANT
DRSG TELFA 3X8 NADH (GAUZE/BANDAGES/DRESSINGS) IMPLANT
ELECT BLADE 6 FLAT ULTRCLN (ELECTRODE) ×2 IMPLANT
ELECT REM PT RETURN 9FT ADLT (ELECTROSURGICAL) ×2
ELECTRODE REM PT RTRN 9FT ADLT (ELECTROSURGICAL) ×1 IMPLANT
GLOVE BIO SURGEON STRL SZ 6.5 (GLOVE) ×4 IMPLANT
GLOVE BIOGEL PI IND STRL 6.5 (GLOVE) ×2 IMPLANT
GLOVE BIOGEL PI INDICATOR 6.5 (GLOVE) ×2
GOWN STRL REUS W/ TWL LRG LVL3 (GOWN DISPOSABLE) ×2 IMPLANT
GOWN STRL REUS W/TWL LRG LVL3 (GOWN DISPOSABLE) ×2
KIT OSTOMY 2 PC DRNBL 2.25 STR (WOUND CARE) ×1 IMPLANT
KIT OSTOMY DRAINABLE 2.25 STR (WOUND CARE) ×1
KIT PREVENA INCISION MGT20CM45 (CANNISTER) ×2 IMPLANT
KIT TURNOVER KIT A (KITS) ×2 IMPLANT
LABEL OR SOLS (LABEL) ×2 IMPLANT
LIGASURE IMPACT 36 18CM CVD LR (INSTRUMENTS) ×2 IMPLANT
NS IRRIG 1000ML POUR BTL (IV SOLUTION) ×10 IMPLANT
NS IRRIG 500ML POUR BTL (IV SOLUTION) IMPLANT
PACK BASIN MAJOR ARMC (MISCELLANEOUS) ×2 IMPLANT
PACK BASIN MINOR ARMC (MISCELLANEOUS) IMPLANT
PENCIL ELECTRO HAND CTR (MISCELLANEOUS) IMPLANT
RELOAD PROXIMATE 75MM BLUE (ENDOMECHANICALS) ×2 IMPLANT
SCALPEL PROTECTED #11 DISP (BLADE) IMPLANT
SEAL FOR SCOPE WARMER C3101 (MISCELLANEOUS) IMPLANT
SET TUBE SMOKE EVAC HIGH FLOW (TUBING) IMPLANT
SET YANKAUER POOLE SUCT (MISCELLANEOUS) IMPLANT
SHEARS HARMONIC 9CM CVD (BLADE) IMPLANT
SHEARS HARMONIC ACE PLUS 36CM (ENDOMECHANICALS) IMPLANT
SOL PREP PVP 2OZ (MISCELLANEOUS)
SOLUTION PREP PVP 2OZ (MISCELLANEOUS) IMPLANT
SPONGE LAP 18X18 RF (DISPOSABLE) ×2 IMPLANT
STAPLE ECHEON FLEX 60 POW ENDO (STAPLE) IMPLANT
STAPLER CUT CVD 40MM BLUE (STAPLE) ×2 IMPLANT
STAPLER CUT RELOAD BLUE (STAPLE) ×2 IMPLANT
STAPLER PROXIMATE 75MM BLUE (STAPLE) ×2 IMPLANT
STAPLER SKIN PROX 35W (STAPLE) ×2 IMPLANT
STRIP CLOSURE SKIN 1/2X4 (GAUZE/BANDAGES/DRESSINGS) IMPLANT
SUT ETHILON 3-0 FS-10 30 BLK (SUTURE) ×2
SUT PDS AB 1 TP1 54 (SUTURE) ×4 IMPLANT
SUT PROLENE 0 CT 1 30 (SUTURE) IMPLANT
SUT PROLENE 2 0 SH DA (SUTURE) ×2 IMPLANT
SUT SILK 2 0 (SUTURE) ×1
SUT SILK 2-0 18XBRD TIE 12 (SUTURE) ×1 IMPLANT
SUT SILK 3 0 (SUTURE) ×1
SUT SILK 3-0 (SUTURE) ×2 IMPLANT
SUT SILK 3-0 18XBRD TIE 12 (SUTURE) ×1 IMPLANT
SUT VIC AB 2-0 BRD 54 (SUTURE) IMPLANT
SUT VIC AB 3-0 SH 27 (SUTURE) ×1
SUT VIC AB 3-0 SH 27X BRD (SUTURE) ×1 IMPLANT
SUT VIC AB 3-0 SH 8-18 (SUTURE) ×2 IMPLANT
SUT VIC AB 4-0 FS2 27 (SUTURE) IMPLANT
SUT VICRYL+ 3-0 144IN (SUTURE) IMPLANT
SUTURE EHLN 3-0 FS-10 30 BLK (SUTURE) ×1 IMPLANT
SWABSTK COMLB BENZOIN TINCTURE (MISCELLANEOUS) IMPLANT
TRAY FOLEY MTR SLVR 16FR STAT (SET/KITS/TRAYS/PACK) ×2 IMPLANT
TROCAR XCEL 12X100 BLDLESS (ENDOMECHANICALS) IMPLANT
TROCAR XCEL NON-BLD 11X100MML (ENDOMECHANICALS) IMPLANT
TUBING EVAC SMOKE HEATED PNEUM (TUBING) IMPLANT

## 2019-06-08 NOTE — ED Notes (Signed)
Verbal telephone consent by daughter, Ariel, RN verified consent.

## 2019-06-08 NOTE — Op Note (Addendum)
Preoperative diagnosis: Diverticular disease with perforation.  Postoperative diagnosis: Diverticular disease with perforation and intra abdominal abscess.  Procedure: Sigmoid colon resection with colostomy (Hartmann's procedure).                       Multiple intra abdominal abscess drainage.   Anesthesia: GETA  Surgeon: Dr. Windell Moment, MD  Wound Classification: Dirty  Indications:  Patient is a 79 y.o. female with abdominal pain and sepsis was found to have an acute perforation of the sigmoid colon with free air on plain x-ray and CT scan of the abdomen and pelvis. Emergent resection was indicated.  Daughter notify by phone of the the finding and recommendations and agreed to proceed with surgery.  Description of procedure:  The patient was placed in the supine position and general endotracheal anesthesia was induced. A time-out was completed verifying correct patient, procedure, site, positioning, and implant(s) and/or special equipment prior to beginning this procedure. Preoperative antibiotics were given. A Foley catheter and nasogastric tube were placed. The abdomen was prepped and draped in the usual sterile fashion. A vertical midline incision was made from xiphoid to just above the pubis. This was deepened through the subcutaneous tissues and hemostasis was achieved with electrocautery. The linea alba was identified and incised and the peritoneal cavity entered. The abdomen was explored. Generalized peritonitis purulent was found.  The small bowel was inspected and retracted to the right using a moist towel and self-retaining retractor. Using electrocautery, the colon was freed from its peritoneal attachments along the line of Toldt proximally from the splenic flexure and distally to the pelvic inlet.  Anterior portion of the sigmoid colon was severely adhered to the uterus.  Very time-consuming and difficult dissection was needed to be done to be able to separate the uterus from the  sigmoid colon.  This freed the colon and multiple abscesses were drained. Also multiple abscess in the right upper quadrant and left upper quadrant were drained.  Both ureters were identified and protected. Points of transection were selected proximally and distally. The bowel was divided with the linear cutting stapler.  Proximally and distally with contour device.  The peritoneum overlying the mesentery was then scored with electrocautery. The peritoneum overlying the mesentery was scored and remaining mesentery divided and ligated with LigaSure device.  An additional portion of the rectosigmoid was needed to be resected due to appearance of nonviable tissue.  This was at the end of the sigmoid at the rectosigmoid junction.  The specimen was removed. The abdominal cavity was then copiously irrigated and hemostasis was checked.  The proximal colon reached easily to the proposed colostomy site without tension. A disk of skin was removed from the colostomy site in the left lower quadrant. The incision was deepened through all layers of the abdominal wall and dilated to admit two fingers. The colon was passed out through the ostomy site without torsion or tension.    The Hartmann's pouch was tagged with long sutures of 2-0 prolene and allowed to fall into the pelvis.  A closed suction drain was placed in the pelvis and brought out through separate stab wounds lateral to the incision. These were secured with 3-0 nylon.  The fascia was closed with a running suture of PDS 1. The skin was closed with skin staples.  A- pressure dressing was placed over the wound. The colostomy was matured with multiple interrupted sutures of 3-0 Vicryl. An ostomy bag was applied.  The patient tolerated the  procedure well and was taken to the postanesthesia care unit in stable condition.   Specimen: Sigmoid colon                     Rectosigmoid colon   Complications: None  EBL: 500 mL

## 2019-06-08 NOTE — Anesthesia Preprocedure Evaluation (Addendum)
Anesthesia Evaluation  Patient identified by MRN, date of birth, ID band Patient confused    Reviewed: Allergy & Precautions, H&P , NPO status , Patient's Chart, lab work & pertinent test results  History of Anesthesia Complications Negative for: history of anesthetic complications  Airway Mallampati: III  TM Distance: >3 FB     Dental  (+) Chipped, Poor Dentition   Pulmonary shortness of breath and with exertion, asthma , sleep apnea , pneumonia, unresolved, COPD,  oxygen dependent, former smoker,           Cardiovascular + CAD       Neuro/Psych PSYCHIATRIC DISORDERS Dementia negative neurological ROS     GI/Hepatic Neg liver ROS, PUD, GERD  ,  Endo/Other  negative endocrine ROS  Renal/GU ARFRenal disease     Musculoskeletal  (+) Arthritis ,   Abdominal   Peds  Hematology negative hematology ROS (+)   Anesthesia Other Findings Patient is COVID Positive   Past Medical History: No date: Acute peptic ulcer, unspecified site, with hemorrhage and  perforation, without mention of obstruction No date: Arthritis No date: Asthma No date: Balance problems     Comment:  unsteady on feet at time No date: 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 06/2017 admit w/ aphasia and blurred vision.  RI w/               extensive chronic microhemorrhages c/w amyloid               angiopathy-->ASA initiated. No date: Chronic airway obstruction, not elsewhere classified     Comment:  on inhalers No date: Diastolic dysfunction     Comment:  a. 06/2017 Echo: EF 50-55%, no rwma, Gr1 DD, nl LA size,               nl RV fxn. No date: Diverticulosis of colon (without mention of hemorrhage) No date: Hematochezia No date: Herpes simplex without mention of complication No date: Morbid obesity ([place] ) No date: Non-obstructive CAD (coronary artery disease)     Comment:  a. 12/2012 MV: EF 71%, no ischemia; b. 06/2016 Cath: LM                nl, LAD 50p, LCX nl, RCA 5m, EF 55-65%. No date: OSA (obstructive sleep apnea) No date: Osteoarthritis     Comment:  a. s/p bilat hip replacements. No date: UTI (urinary tract infection)     Comment:  a. 09/2017 admit for E coli & Klebsiella UTI. No date: Ventral hernia  Past Surgical History: No date: APPENDECTOMY No date: BUNIONECTOMY; Bilateral 1970's: COSMETIC SURGERY     Comment:  abdomen. Tummy Tuck dec 2015 right : FOOT SURGERY; Bilateral     Comment:  left 3 yrs ago 07/08/2016: RIGHT/LEFT HEART CATH AND CORONARY ANGIOGRAPHY; Bilateral     Comment:  Procedure: Right/Left Heart Cath and Coronary               Angiography;  Surgeon: Minna Merritts, MD;  Location:               [place] [facility];  Service: Cardiovascular;                Laterality: Bilateral; 2007: TOTAL HIP ARTHROPLASTY     Comment:  left No date: TOTAL HIP ARTHROPLASTY     Comment:  bilateral  08/08/2014: TOTAL KNEE ARTHROPLASTY; Right     Comment:  Procedure: RIGHT TOTAL KNEE ARTHROPLASTY WITH  NAVIGATION;  Surgeon: Rod Can, MD;  Location: [facility]               ORS;  Service: Orthopedics;  Laterality: Right; 10/03/2014: TOTAL KNEE ARTHROPLASTY; Left     Comment:  Procedure: LEFT TOTAL KNEE ARTHROPLASTY;  Surgeon: Rod Can, MD;  Location: [facility] ORS;  Service: Orthopedics;                Laterality: Left; No date: TUBAL LIGATION No date: UMBILICAL HERNIA REPAIR     Comment:  2016  BMI    Body Mass Index: 39.47 kg/m      Reproductive/Obstetrics negative OB ROS                            Anesthesia Physical Anesthesia Plan  ASA: V and emergent  Anesthesia Plan: General ETT   Post-op Pain Management:    Induction: Intravenous, Cricoid pressure planned and Rapid sequence  PONV Risk Score and Plan: Ondansetron, Dexamethasone, Midazolam and Treatment may vary due to age or medical condition  Airway Management Planned: Oral ETT and  Video Laryngoscope Planned  Additional Equipment:   Intra-op Plan:   Post-operative Plan: Extubation in OR and Post-operative intubation/ventilation  Informed Consent: I have reviewed the patients History and Physical, chart, labs and discussed the procedure including the risks, benefits and alternatives for the proposed anesthesia with the patient or authorized representative who has indicated his/her understanding and acceptance.     Dental Advisory Given  Plan Discussed with: Anesthesiologist, CRNA and Surgeon  Anesthesia Plan Comments: (History and consent over the phone from the patients daughter Sherrill Raring at [phone]   Daughter informed that patient is higher risk for complications from anesthesia during this procedure due to their medical history.  She voiced understanding.  Daughter consented for risks of anesthesia including but not limited to:  - adverse reactions to medications - damage to teeth, lips or other oral mucosa - sore throat or hoarseness - Damage to heart, brain, lungs or loss of life  She voiced understanding.)       Anesthesia Quick Evaluation

## 2019-06-08 NOTE — ED Provider Notes (Signed)
[facility] Emergency Department Provider Note    ____________________________________________   I have reviewed the triage vital signs and the nursing notes.   HISTORY  Chief Complaint Failure to thrive  History limited by and level 5 caveat due to: AMS   HPI Susan [name] is a 79 y.o. female who presents to the emergency department today because of concern for altered mental status and failure to thrive. Patient did present via EMS. Had recent admission for COVID. Apparently has not been doing as well however the patient herself cannot give any history.   Records reviewed. Per medical record review patient has a history of recent admission for covid.   Past Medical History:  Diagnosis Date  . Acute peptic ulcer, unspecified site, with hemorrhage and perforation, without mention of obstruction   . Arthritis   . Asthma   . Balance problems    unsteady on feet at time  . Cerebral amyloid angiopathy ([place])    a. 06/2017 admit w/ aphasia and blurred vision.  RI w/ extensive chronic microhemorrhages c/w amyloid angiopathy-->ASA initiated.  . Chronic airway obstruction, not elsewhere classified    on inhalers  . Diastolic dysfunction    a. 06/2017 Echo: EF 50-55%, no rwma, Gr1 DD, nl LA size, nl RV fxn.  . Diverticulosis of colon (without mention of hemorrhage)   . Hematochezia   . 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 Morbid obesity ([place])   . Non-obstructive CAD (coronary artery disease)    a. 12/2012 MV: EF 71%, no ischemia; b. 06/2016 Cath: LM nl, LAD 50p, LCX nl, RCA 5m, EF 55-65%.  . OSA (obstructive sleep apnea)   . Osteoarthritis    a. s/p bilat hip replacements.  Marland Kitchen UTI (urinary tract infection)    a. 09/2017 admit for E coli & Klebsiella UTI.  . Ventral hernia     Patient Active Problem List   Diagnosis Date Noted  . Pneumonia due to COVID-19 virus 06/04/2019  . UTI (urinary tract infection) 06/01/2019  . Acute metabolic  encephalopathy [id number]  . Frequent falls 06/01/2019  . Altered mental status 05/28/2019  . Mixed Alzheimer's and vascular dementia ([place]) 05/28/2019  . GAD (generalized anxiety disorder) 08/11/2017  . Hypoxia 08/11/2017  . Mixed hyperlipidemia   . Aortic atherosclerosis ([place]) 07/06/2016  . Coronary artery disease involving native coronary artery of native heart with unstable angina pectoris ([place]) 07/06/2016  . Acute on chronic respiratory failure with hypoxia ([place]) 04/18/2016  . Balance problem 10/08/2015  . ILD (interstitial lung disease) ([place]) 07/29/2015  . Hx of urinary tract infection 06/23/2015  . Chronic insomnia 12/09/2014  . Primary osteoarthritis of left knee 10/03/2014  . Primary osteoarthritis of right knee 08/08/2014  . COPD with acute exacerbation ([place]) 10/22/2013  . Glenohumeral arthritis, Severe 08/20/2013  . Solitary pulmonary nodule 02/19/2013  . Shortness of breath 02/12/2013  . Obstructive sleep apnea 10/11/2012  . Umbilical hernia [id number]  . Urinary incontinence, urge 01/17/2012  . Diastolic dysfunction [id number]  . Preoperative cardiovascular examination 04/21/2011  . DDD (degenerative disc disease) 10/26/2010  . RESTLESS LEG SYNDROME 02/23/2010  . HIP REPLACEMENT, LEFT, HX OF 12/21/2009  . FOOT PAIN, BILATERAL 03/25/2009  . HYPERCHOLESTEROLEMIA 06/18/2008  . ALLERGIC RHINITIS 05/21/2008  . ANKLE PAIN, BILATERAL 05/21/2008  . DIVERTICULOSIS, COLON 11/26/2007  . 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 APPENDECTOMY    . BUNIONECTOMY Bilateral   .  COSMETIC SURGERY  1970's   abdomen. Tummy Tuck  . FOOT SURGERY Bilateral dec 2015 right    left 3 yrs ago  . RIGHT/LEFT HEART CATH AND CORONARY ANGIOGRAPHY Bilateral 07/08/2016   Procedure: Right/Left Heart Cath and Coronary Angiography;  Surgeon: Minna Merritts, MD;  Location: [place] [facility];  Service:  Cardiovascular;  Laterality: Bilateral;  . TOTAL HIP ARTHROPLASTY  2007   left  . TOTAL HIP ARTHROPLASTY     bilateral   . TOTAL KNEE ARTHROPLASTY Right 08/08/2014   Procedure: RIGHT TOTAL KNEE ARTHROPLASTY WITH NAVIGATION;  Surgeon: Rod Can, MD;  Location: [facility];  Service: Orthopedics;  Laterality: Right;  . TOTAL KNEE ARTHROPLASTY Left 10/03/2014   Procedure: LEFT TOTAL KNEE ARTHROPLASTY;  Surgeon: Rod Can, MD;  Location: [facility];  Service: Orthopedics;  Laterality: Left;  . TUBAL LIGATION    . UMBILICAL HERNIA REPAIR     2016    Prior to Admission medications   Medication Sig Start Date End Date Taking? Authorizing Provider  ascorbic acid (VITAMIN C) 500 MG tablet Take 1 tablet (500 mg total) by mouth daily. 06/05/19   Nicole Kindred A, DO  budesonide-formoterol (SYMBICORT) 160-4.5 MCG/ACT inhaler TAKE 2 PUFFS INTO LUNGS TWICE A DAY Patient taking differently: Inhale 2 puffs into the lungs 2 (two) times daily.  05/28/19   Bedsole, Amy E, MD  dexamethasone (DECADRON) 6 MG tablet Take 1 tablet (6 mg total) by mouth daily for 7 days. 06/05/19 06/12/19  Ezekiel Slocumb, DO  diltiazem (CARDIZEM CD) 180 MG 24 hr capsule Take 1 capsule (180 mg total) by mouth daily. 03/05/19   Minna Merritts, MD  donepezil (ARICEPT) 5 MG tablet Take 5 mg by mouth daily.  05/08/19 05/07/20  [provider]  feeding supplement, ENSURE ENLIVE, (ENSURE ENLIVE) LIQD Take 237 mLs by mouth 3 (three) times daily between meals. 06/05/19   Ezekiel Slocumb, DO  guaiFENesin-dextromethorphan (ROBITUSSIN DM) 100-10 MG/5ML syrup Take 10 mLs by mouth every 4 (four) hours as needed for cough. 06/05/19   Ezekiel Slocumb, DO  Multiple Vitamin (MULTIVITAMIN WITH MINERALS) TABS tablet Take 1 tablet by mouth daily. 06/05/19   Nicole Kindred A, DO  OXYGEN Inhale 2 L into the lungs daily.    [provider]  rosuvastatin (CRESTOR) 5 MG tablet Take 1 tablet (5 mg total) by mouth daily. 03/05/19    Minna Merritts, MD  tiotropium (SPIRIVA) 18 MCG inhalation capsule Place 1 capsule (18 mcg total) into inhaler and inhale daily. 05/28/19   Bedsole, Amy E, MD  traZODone (DESYREL) 50 MG tablet TAKE 1 & 1/2 TABLETS BY MOUTH AT [facility] NEEDED FOR SLEEP Patient taking differently: Take 75 mg by mouth at bedtime as needed for sleep.  04/13/18   Bedsole, Amy E, MD  ZOLOFT 25 MG tablet Take 25 mg by mouth daily.  04/10/19   [provider]    Allergies Patient has no known allergies.  Family History  Problem Relation Age of Onset  . Diabetes Mother   . Coronary artery disease Mother   . Lung disease Mother   . Heart disease Mother        CAD  . Coronary artery disease Father   . Kidney disease Father   . Diabetes Father   . Heart disease Father        CAD  . Cancer Neg Hx     Social History Social History   Tobacco Use  . Smoking  status: Former Smoker    Packs/day: 0.50    Years: 40.00    Pack years: 20.00    Types: Cigarettes    Quit date: 08/29/2000    Years since quitting: 18.7  . Smokeless tobacco: Never Used  Substance Use Topics  . Alcohol use: No    Alcohol/week: 0.0 standard drinks    Comment: rare  . Drug use: No    Review of Systems Unable to obtain secondary to altered mental status ____________________________________________   PHYSICAL EXAM:  VITAL SIGNS: ED Triage Vitals  Enc Vitals Group     BP      Pulse      Resp      Temp      Temp src      SpO2      Weight      Height      Head Circumference      Peak Flow      Pain Score      Pain Loc      Pain Edu?      Excl. in [facility]?      Constitutional: Awake and alert. Not oriented.  Eyes: Conjunctivae are normal.  ENT      Head: Normocephalic and atraumatic.      Nose: No congestion/rhinnorhea.      Mouth/Throat: Mucous membranes are moist.      Neck: No stridor. Hematological/Lymphatic/Immunilogical: No cervical lymphadenopathy. Cardiovascular: Tachycardic, regular rhythm.  No  murmurs, rubs, or gallops.  Respiratory: Normal respiratory effort without tachypnea nor retractions. Breath sounds are clear and equal bilaterally. No wheezes/rales/rhonchi. Gastrointestinal: Soft and tender to palpation.  Genitourinary: Deferred Musculoskeletal: Normal range of motion in all extremities. No lower extremity edema. Neurologic:  Awake and alert. Not oriented. Cannot appropriately answer questions. Moving all extremities.  Skin:  Skin is warm, dry and intact. No rash noted. ____________________________________________    LABS (pertinent positives/negatives)  Lactic acid 2.3 CMP na 139, k 4.6, glu 216, cr 1.21 CBC wbc 13.9, hgb 16.1, plt 237 ____________________________________________   EKG  I, Nance Pear, attending physician, personally viewed and interpreted this EKG  EKG Time: 0846 Rate: 106 Rhythm: sinus tachycardia Axis: left axis deviation Intervals: qtc 465 QRS: narrow ST changes: no st elevation Impression: abnormal ekg  ____________________________________________    RADIOLOGY  CXR Concern for free air  CT abd/pel Free air, concern for diverticulitis  ____________________________________________   PROCEDURES  Procedures  CRITICAL CARE Performed by: Nance Pear   Total critical care time: 30 minutes  Critical care time was exclusive of separately billable procedures and treating other patients.  Critical care was necessary to treat or prevent imminent or life-threatening deterioration.  Critical care was time spent personally by me on the following activities: development of treatment plan with patient and/or surrogate as well as nursing, discussions with consultants, evaluation of patient's response to treatment, examination of patient, obtaining history from patient or surrogate, ordering and performing treatments and interventions, ordering and review of laboratory studies, ordering and review of radiographic studies, pulse  oximetry and re-evaluation of patient's condition.  ____________________________________________   INITIAL IMPRESSION / ASSESSMENT AND PLAN / ED COURSE  Pertinent labs & imaging results that were available during my care of the patient were reviewed by me and considered in my medical decision making (see chart for details).   Patient presented to the emergency department today from home because of concerns for failure to thrive.  Patient is altered here in the emergency department had  recent admission for Covid.  Did have fevers tachycardia and tachypnea.  Initially I did have concerns for worsening Covid infection and did obtain a x-ray of the chest.  On this x-ray however there was concern for free air so CT abdomen pelvis was obtained.  This was consistent with free air of possibly from a perforated diverticulum.  Patient did have tenderness in her abdomen.  Patient was started on IV fluids and antibiotics.  I did discuss with surgery for admission.  ____________________________________________   FINAL CLINICAL IMPRESSION(S) / ED DIAGNOSES  Final diagnoses:  Free intraperitoneal air  Diverticulitis  Altered mental status, unspecified altered mental status type     Note: This dictation was prepared with Dragon dictation. Any transcriptional errors that result from this process are unintentional     Nance Pear, MD 05/31/2019 1148

## 2019-06-08 NOTE — ED Triage Notes (Signed)
To ER via EMS from home.  Pt was hospitalized recently for 5 days with CoVid and released on Wednesday.  Pt returns today with "failure to thrive", altered mental status and "pain all over".  Pt with noted foul smelling urine and reports of recurrent UTI, for which she was treat while in the hospital.

## 2019-06-08 NOTE — Transfer of Care (Signed)
Immediate Anesthesia Transfer of Care Note  Patient: Susan [name]  Procedure(s) Performed: EXPLORATORY LAPAROTOMY (N/A Abdomen) COLOSTOMY (N/A Abdomen) APPLICATION OF WOUND VAC (N/A Abdomen)  Patient Location: PACU and ICU  Anesthesia Type:General  Level of Consciousness: Patient remains intubated per anesthesia plan  Airway & Oxygen Therapy: Patient remains intubated per anesthesia plan and Patient placed on Ventilator (see vital sign flow sheet for setting)  Post-op Assessment: Report given to RN and Post -op Vital signs reviewed and stable  Post vital signs: Reviewed and stable  Last Vitals:  Vitals Value Taken Time  BP 115/82 06/22/2019 1629  Temp 36.8 C 06/15/2019 1629  Pulse 104 05/26/2019 1632  Resp 0 06/11/2019 1632  SpO2 97 % 05/31/2019 1632  Vitals shown include unvalidated device data.  Last Pain:  Vitals:   05/25/2019 1629  TempSrc:   PainSc: 0-No pain         Complications: No apparent anesthesia complications

## 2019-06-08 NOTE — Anesthesia Postprocedure Evaluation (Signed)
Anesthesia Post Note  Patient: Susan [name]  Procedure(s) Performed: EXPLORATORY LAPAROTOMY (N/A Abdomen) COLOSTOMY (N/A Abdomen) APPLICATION OF WOUND VAC (N/A Abdomen)  Patient location during evaluation: SICU Anesthesia Type: General Level of consciousness: sedated Pain management: pain level controlled Vital Signs Assessment: post-procedure vital signs reviewed and stable Respiratory status: patient remains intubated per anesthesia plan Cardiovascular status: stable Postop Assessment: no apparent nausea or vomiting Anesthetic complications: no     Last Vitals:  Vitals:   06/04/2019 1629 06/19/2019 1659  BP: 115/82 107/67  Pulse: 97 (!) 105  Resp: 16 16  Temp: 36.8 C (!) 36.3 C  SpO2: 100% 97%    Last Pain:  Vitals:   05/26/2019 1659  TempSrc: Axillary  PainSc:                  Precious Haws Piscitello

## 2019-06-08 NOTE — Anesthesia Procedure Notes (Signed)
Procedure Name: Intubation Date/Time: 06/03/2019 12:03 PM Performed by: Aline Brochure, CRNA Pre-anesthesia Checklist: Patient identified, Emergency Drugs available, Suction available and Patient being monitored Patient Re-evaluated:Patient Re-evaluated prior to induction Oxygen Delivery Method: Circle system utilized Preoxygenation: Pre-oxygenation with 100% oxygen Induction Type: IV induction, Rapid sequence and Cricoid Pressure applied Laryngoscope Size: McGraph and 3 Grade View: Grade I Tube type: Oral Tube size: 7.0 mm Number of attempts: 1 Airway Equipment and Method: Stylet and Video-laryngoscopy Placement Confirmation: ETT inserted through vocal cords under direct vision,  positive ETCO2 and breath sounds checked- equal and bilateral Secured at: 21 cm Tube secured with: Tape Dental Injury: Teeth and Oropharynx as per pre-operative assessment

## 2019-06-08 NOTE — H&P (Signed)
History of Present Illness Susan [name] is a 79 y.o. female with perforation of intestine. Patient recently discharged due to COVID-19 virus infection. Patient is not able to give history due to acute over chronic dementia. I contacted the daughter by phone and she told me that the patient has been looking worst since last night with worsening altered mental status. She has been having worsening difficulty breathing. Patient looks in abdominal pain but cannot assess pain radiation, aggravating or alleviating factors. No measured fever. No vomiting.   Past Medical History Past Medical History:  Diagnosis Date  . Acute peptic ulcer, unspecified site, with hemorrhage and perforation, without mention of obstruction   . Arthritis   . Asthma   . Balance problems    unsteady on feet at time  . Cerebral amyloid angiopathy ([place])    a. 06/2017 admit w/ aphasia and blurred vision.  RI w/ extensive chronic microhemorrhages c/w amyloid angiopathy-->ASA initiated.  . Chronic airway obstruction, not elsewhere classified    on inhalers  . Diastolic dysfunction    a. 06/2017 Echo: EF 50-55%, no rwma, Gr1 DD, nl LA size, nl RV fxn.  . Diverticulosis of colon (without mention of hemorrhage)   . Hematochezia   . 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 Morbid obesity ([place])   . Non-obstructive CAD (coronary artery disease)    a. 12/2012 MV: EF 71%, no ischemia; b. 06/2016 Cath: LM nl, LAD 50p, LCX nl, RCA 46m, EF 55-65%.  . OSA (obstructive sleep apnea)   . Osteoarthritis    a. s/p bilat hip replacements.  Marland Kitchen UTI (urinary tract infection)    a. 09/2017 admit for E coli & Klebsiella UTI.  . Ventral hernia       Past Surgical History:  Procedure Laterality Date  . APPENDECTOMY    . BUNIONECTOMY Bilateral   . COSMETIC SURGERY  1970's   abdomen. Tummy Tuck  . FOOT SURGERY Bilateral dec 2015 right    left 3 yrs ago  . RIGHT/LEFT HEART CATH AND CORONARY ANGIOGRAPHY Bilateral 07/08/2016   Procedure: Right/Left Heart Cath and Coronary Angiography;  Surgeon: Minna Merritts, MD;  Location: [place] [facility];  Service: Cardiovascular;  Laterality: Bilateral;  . TOTAL HIP ARTHROPLASTY  2007   left  . TOTAL HIP ARTHROPLASTY     bilateral   . TOTAL KNEE ARTHROPLASTY Right 08/08/2014   Procedure: RIGHT TOTAL KNEE ARTHROPLASTY WITH NAVIGATION;  Surgeon: Rod Can, MD;  [facility];  Service: Orthopedics;  Laterality: Right;  . TOTAL KNEE ARTHROPLASTY Left 10/03/2014   Procedure: LEFT TOTAL KNEE ARTHROPLASTY;  Surgeon: Rod Can, MD;  Location: [facility];  Service: Orthopedics;  Laterality: Left;  . TUBAL LIGATION    . UMBILICAL HERNIA REPAIR     2016    No Known Allergies  Current Facility-Administered Medications  Medication Dose Route Frequency Provider Last Rate Last Admin  . 0.9 %  sodium chloride infusion   Intravenous Continuous Herbert Pun, MD      . piperacillin-tazobactam (ZOSYN) IVPB 3.375 g  3.375 g Intravenous Once Nance Pear, MD 100 mL/hr at 05/30/2019 1050 3.375 g at 06/03/2019 1050   Current Outpatient Medications  Medication Sig Dispense Refill  . ascorbic acid (VITAMIN C) 500 MG tablet Take 1 tablet (500 mg total) by mouth daily.    . budesonide-formoterol (SYMBICORT) 160-4.5 MCG/ACT inhaler TAKE 2 PUFFS INTO LUNGS TWICE A DAY (Patient taking differently: Inhale 2 puffs into the lungs 2 (two) times  daily. ) 1 Inhaler 11  . dexamethasone (DECADRON) 6 MG tablet Take 1 tablet (6 mg total) by mouth daily for 7 days. 7 tablet 0  . diltiazem (CARDIZEM CD) 180 MG 24 hr capsule Take 1 capsule (180 mg total) by mouth daily. 90 capsule 3  . donepezil (ARICEPT) 5 MG tablet Take 5 mg by mouth daily.     . feeding supplement, ENSURE ENLIVE, (ENSURE ENLIVE) LIQD Take 237 mLs by mouth 3 (three) times daily between meals. 237 mL 12  . guaiFENesin-dextromethorphan (ROBITUSSIN DM) 100-10 MG/5ML syrup Take 10 mLs by mouth every 4 (four) hours as needed for  cough. 118 mL 0  . Multiple Vitamin (MULTIVITAMIN WITH MINERALS) TABS tablet Take 1 tablet by mouth daily.    . OXYGEN Inhale 2 L into the lungs daily.    . rosuvastatin (CRESTOR) 5 MG tablet Take 1 tablet (5 mg total) by mouth daily. 90 tablet 3  . tiotropium (SPIRIVA) 18 MCG inhalation capsule Place 1 capsule (18 mcg total) into inhaler and inhale daily. 30 capsule 12  . traZODone (DESYREL) 50 MG tablet TAKE 1 & 1/2 TABLETS BY MOUTH AT [facility] NEEDED FOR SLEEP (Patient taking differently: Take 75 mg by mouth at bedtime as needed for sleep. ) 135 tablet 1  . ZOLOFT 25 MG tablet Take 25 mg by mouth daily.      Facility-Administered Medications Ordered in Other Encounters  Medication Dose Route Frequency Provider Last Rate Last Admin  . dexamethasone (DECADRON) injection 4 mg  4 mg Intravenous Once Rod Can, MD      . ondansetron [facility]) 4 mg in sodium chloride 0.9 % 50 mL IVPB  4 mg Intravenous Once Rod Can, MD      . ondansetron (ZOFRAN) 4 mg in sodium chloride 0.9 % 50 mL IVPB  4 mg Intravenous Once Rod Can, MD        Family History Family History  Problem Relation Age of Onset  . Diabetes Mother   . Coronary artery disease Mother   . Lung disease Mother   . Heart disease Mother        CAD  . Coronary artery disease Father   . Kidney disease Father   . Diabetes Father   . Heart disease Father        CAD  . Cancer Neg Hx        Social History Social History   Tobacco Use  . Smoking status: Former Smoker    Packs/day: 0.50    Years: 40.00    Pack years: 20.00    Types: Cigarettes    Quit date: 08/29/2000    Years since quitting: 18.7  . Smokeless tobacco: Never Used  Substance Use Topics  . Alcohol use: No    Alcohol/week: 0.0 standard drinks    Comment: rare  . Drug use: No        ROS Full ROS of systems performed and is otherwise negative there than what is stated in the HPI  Physical Exam Blood pressure 111/69, pulse (!) 113,  temperature (!) 101.4 F (38.6 C), temperature source Rectal, resp. rate (!) 31, height 5\' 4"  (1.626 m), weight 104.3 kg, SpO2 91 %.  CONSTITUTIONAL: altered mental status, distress. Disoriented in person, place and time.  EYES: Pupils equal, round, and reactive to light, Sclera non-icteric. EARS, NOSE, MOUTH AND THROAT: The oropharynx is clear. Oral mucosa is pink and moist. Hearing is intact to voice.  NECK: Trachea is midline, and there  is no jugular venous distension. Thyroid is without palpable abnormalities. LYMPH NODES:  Lymph nodes in the neck are not enlarged. RESPIRATORY:  Lungs are clear, and breath sounds are equal bilaterally. Normal respiratory effort without pathologic use of accessory muscles. CARDIOVASCULAR: Heart is regular without murmurs, gallops, or rubs. GI: The abdomen is distended, tender. There were no palpable masses. There was no hepatosplenomegaly.  GU: normal MUSCULOSKELETAL:  Normal muscle strength and tone in all four extremities.    SKIN: Skin turgor is normal. There are no pathologic skin lesions.  NEUROLOGIC:  Motor and sensation is grossly normal.  Cranial nerves are grossly intact. PSYCH:  Altered mental status.  Data Reviewed CBC    Component Value Date/Time   WBC 13.9 (H) 05/26/2019 0900   RBC 5.51 (H) 06/13/2019 0900   HGB 16.1 (H) 06/05/2019 0900   HGB 12.9 07/06/2016 1159   HCT 49.1 (H) 05/27/2019 0900   HCT 40.3 07/06/2016 1159   PLT 237 06/23/2019 0900   PLT 171 07/06/2016 1159   MCV 89.1 06/03/2019 0900   MCV 91 07/06/2016 1159   MCH 29.2 06/19/2019 0900   MCHC 32.8 06/06/2019 0900   RDW 15.2 05/24/2019 0900   RDW 15.7 (H) 07/06/2016 1159   LYMPHSABS 0.6 (L) 05/27/2019 0900   LYMPHSABS 1.2 07/06/2016 1159   MONOABS 0.6 06/13/2019 0900   EOSABS 0.3 05/30/2019 0900   EOSABS 0.3 07/06/2016 1159   BASOSABS 0.1 06/13/2019 0900   BASOSABS 0.0 07/06/2016 1159   Lactic Acid, Venous    Component Value Date/Time   LATICACIDVEN 2.3 ([place])  06/23/2019 0900   CT-scan of the abdomen I personally evaluated the images and discussed with radiologist. Abundant free air with severe inflammation of the sigmoid and descending colon.   I have personally reviewed the patient's imaging and medical records.    Assessment    79 yo female with perforated diverticulitis with current medical comorbilities including: recent COVID-19 virus infection, baseline dementia as per daughter, asthma, COPD, CAD.   Patient critically ill in need of emergent surgery with severe comorbilities. I discussed the situation with the daughter Reubin Milan at [phone] and she understood the severity of the situation and agreed to proceed with surgery. I discuss with her that my plan is to perform exploratory laparotomy with possible intestinal resection and colostomy creation. I discussed with the daughter that patient most likely will stay intubated with need of mechanical ventilation.   Plan    Admit to ICU after surgery Emergent exploratory laparotomy  Empiric antibiotic therapy IVF's  NGT [place] Appreciate Hospitalist management of comorbilities.   Face-to-face time spent with the patient and care providers was 75 minutes, with more than 50% of the time spent counseling, educating, and coordinating care of the patient.    Herbert Pun, MD  Herbert Pun 06/09/2019, 10:56 AM

## 2019-06-08 NOTE — H&P (Signed)
History and Physical    MADYLAN [name] [id number] DOB: Aug 24, 1940 DOA: 05/26/2019  Referring MD/NP/PA:   PCP: [name], MD   Patient coming from:  The patient is coming from home.  At baseline, pt is independent for most of ADL.        Chief Complaint: AMS and abdominal pain  HPI: Susan [name] is a 79 y.o. female with medical history significant of hyperlipidemia, COPD, asthma, depression, OSA, CAD, CHF, diverticulosis, peptic ulcer disease with perforation in the past, cerebral amyloid angiopathy, who presents with altered mental status and abdominal pain.  Per her daughter (I called her daughter by phone), patient was recently hospitalized from 1/9-1/13 due to COVID-19 infection.  Patient was discharged home on Decadron (patient is supposed to take it from 1/13-1/20).  Per her daughter, the symptoms are related COVID-19 infection have almost resolved.  Patient is very confused today. Patient has chronic mild shortness of breath due to COPD.  Daughter states that patient does not seem to have chest pain or cough.  Patient started having abdominal pain since last night with little bit of loose stool.  No active nausea vomiting noted.  Does not seem to have symptoms of UTI per her daughter.  Patient moves all extremities.  ED Course: pt was found to have WBC 13.9, INR 1.3, PTT 28, lactic acid 2.3, pending urinalysis, mild AKI with creatinine 1.21, BUN 36, temperature one 1.4, blood pressure 125/73, tachycardia, tachypnea, oxygen saturation 99% on 2 L nasal cannula oxygen.  X-ray showed free air under right hemidiaphragm. cT-abd/pelvis finding is consistent with bowel perforation  CT-abd/pelvis: 1. Free air and fluid throughout the abdomen compatible with bowel perforation. 2. Extensive inflammatory changes about the colon beginning at the splenic flexure. Changes are most profound at the sigmoid colon, consistent with diverticulitis. This is most likely source of perforation. 3.  Diffuse colonic diverticulosis. 4. Atherosclerosis. 5. Stable low-density lesions in the liver. These are likely benign. 6. Patchy bilateral airspace disease likely reflects atelectasis or infection. 7. Mltilevel degenerative changes and scoliosis of the lumbar spine.   Review of Systems: Could not reviewed due to altered mental status.  Allergy: No Known Allergies  Past Medical History:  Diagnosis Date  . Acute peptic ulcer, unspecified site, with hemorrhage and perforation, without mention of obstruction   . Arthritis   . Asthma   . Balance problems    unsteady on feet at time  . Cerebral amyloid angiopathy ([place])    a. 06/2017 admit w/ aphasia and blurred vision.  RI w/ extensive chronic microhemorrhages c/w amyloid angiopathy-->ASA initiated.  . Chronic airway obstruction, not elsewhere classified    on inhalers  . Diastolic dysfunction    a. 06/2017 Echo: EF 50-55%, no rwma, Gr1 DD, nl LA size, nl RV fxn.  . Diverticulosis of colon (without mention of hemorrhage)   . Hematochezia   . 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 Morbid obesity ([place])   . Non-obstructive CAD (coronary artery disease)    a. 12/2012 MV: EF 71%, no ischemia; b. 06/2016 Cath: LM nl, LAD 50p, LCX nl, RCA 52m, EF 55-65%.  . OSA (obstructive sleep apnea)   . Osteoarthritis    a. s/p bilat hip replacements.  Marland Kitchen UTI (urinary tract infection)    a. 09/2017 admit for E coli & Klebsiella UTI.  . Ventral hernia     Past Surgical History:  Procedure Laterality Date  . APPENDECTOMY    . BUNIONECTOMY Bilateral   .  COSMETIC SURGERY  1970's   abdomen. Tummy Tuck  . FOOT SURGERY Bilateral dec 2015 right    left 3 yrs ago  . RIGHT/LEFT HEART CATH AND CORONARY ANGIOGRAPHY Bilateral 07/08/2016   Procedure: Right/Left Heart Cath and Coronary Angiography;  Surgeon: Minna Merritts, MD;  Location: [facility];  Service: Cardiovascular;  Laterality: Bilateral;  . TOTAL HIP ARTHROPLASTY  2007   left   . TOTAL HIP ARTHROPLASTY     bilateral   . TOTAL KNEE ARTHROPLASTY Right 08/08/2014   Procedure: RIGHT TOTAL KNEE ARTHROPLASTY WITH NAVIGATION;  Surgeon: Rod Can, MD;  Location: [facility];  Service: Orthopedics;  Laterality: Right;  . TOTAL KNEE ARTHROPLASTY Left 10/03/2014   Procedure: LEFT TOTAL KNEE ARTHROPLASTY;  Surgeon: Rod Can, MD;  Location: [facility];  Service: Orthopedics;  Laterality: Left;  . TUBAL LIGATION    . UMBILICAL HERNIA REPAIR     2016    Social History:  reports that she quit smoking about 18 years ago. Her smoking use included cigarettes. She has a 20.00 pack-year smoking history. She has never used smokeless tobacco. She reports that she does not drink alcohol or use drugs.  Family History:  Family History  Problem Relation Age of Onset  . Diabetes Mother   . Coronary artery disease Mother   . Lung disease Mother   . Heart disease Mother        CAD  . Coronary artery disease Father   . Kidney disease Father   . Diabetes Father   . Heart disease Father        CAD  . Cancer Neg Hx      Prior to Admission medications   Medication Sig Start Date End Date Taking? Authorizing Provider  ascorbic acid (VITAMIN C) 500 MG tablet Take 1 tablet (500 mg total) by mouth daily. 06/05/19   Nicole Kindred A, DO  budesonide-formoterol (SYMBICORT) 160-4.5 MCG/ACT inhaler TAKE 2 PUFFS INTO LUNGS TWICE A DAY Patient taking differently: Inhale 2 puffs into the lungs 2 (two) times daily.  05/28/19   Bedsole, Amy E, MD  dexamethasone (DECADRON) 6 MG tablet Take 1 tablet (6 mg total) by mouth daily for 7 days. 06/05/19 06/12/19  Ezekiel Slocumb, DO  diltiazem (CARDIZEM CD) 180 MG 24 hr capsule Take 1 capsule (180 mg total) by mouth daily. 03/05/19   Minna Merritts, MD  donepezil (ARICEPT) 5 MG tablet Take 5 mg by mouth daily.  05/08/19 05/07/20  [provider]  feeding supplement, ENSURE ENLIVE, (ENSURE ENLIVE) LIQD Take 237 mLs by mouth 3 (three) times daily  between meals. 06/05/19   Ezekiel Slocumb, DO  guaiFENesin-dextromethorphan (ROBITUSSIN DM) 100-10 MG/5ML syrup Take 10 mLs by mouth every 4 (four) hours as needed for cough. 06/05/19   Ezekiel Slocumb, DO  Multiple Vitamin (MULTIVITAMIN WITH MINERALS) TABS tablet Take 1 tablet by mouth daily. 06/05/19   Nicole Kindred A, DO  OXYGEN Inhale 2 L into the lungs daily.    [provider]  rosuvastatin (CRESTOR) 5 MG tablet Take 1 tablet (5 mg total) by mouth daily. 03/05/19   Minna Merritts, MD  tiotropium (SPIRIVA) 18 MCG inhalation capsule Place 1 capsule (18 mcg total) into inhaler and inhale daily. 05/28/19   Bedsole, Amy E, MD  traZODone (DESYREL) 50 MG tablet TAKE 1 & 1/2 TABLETS BY MOUTH AT [facility] NEEDED FOR SLEEP Patient taking differently: Take 75 mg by mouth at bedtime as needed for sleep.  04/13/18   Bedsole, Amy E, MD  ZOLOFT 25 MG tablet Take 25 mg by mouth daily.  04/10/19   [provider]    Physical Exam: Vitals:   06/05/2019 1629 06/13/2019 1659 06/04/2019 1700 06/16/2019 1800  BP: 115/82 107/67 120/71 (!) 72/62  Pulse: 97 (!) 105 (!) 104 (!) 101  Resp: 16 16 16 16   Temp: 98.2 F (36.8 C) (!) 97.4 F (36.3 C)    TempSrc:  Axillary    SpO2: 100% 97% 97% 94%  Weight:  96.1 kg    Height:  5\' 3"  (1.6 m)     General: Not in acute distress HEENT:       Eyes: PERRL, EOMI, no scleral icterus.       ENT: No discharge from the ears and nose, no pharynx injection, no tonsillar enlargement.        Neck: No JVD, no bruit, no mass felt. Heme: No neck lymph node enlargement. Cardiac: S1/S2, RRR, No murmurs, No gallops or rubs. Respiratory: No rales, wheezing, rhonchi or rubs. GI: is distended with diffused tenderness and guarding,  no organomegaly, BS present. GU: No hematuria Ext: No pitting leg edema bilaterally. 2+DP/PT pulse bilaterally. Musculoskeletal: No joint deformities, No joint redness or warmth, no limitation of ROM in spin. Skin: No rashes.  Neuro:  confused, not oriented X3, cranial nerves II-XII grossly intact, moves all extremities. Psych: Patient is not psychotic, no suicidal or hemocidal ideation.  Labs on Admission: I have personally reviewed following labs and imaging studies  CBC: Recent Labs  Lab 06/01/19 1939 06/03/19 0655 06/04/19 0415 06/05/19 0732 05/27/2019 0900  WBC 6.3 10.2 7.5 5.9 13.9*  NEUTROABS  --  8.5* 5.9 4.6 12.2*  HGB 14.1 14.2 14.7 14.0 16.1*  HCT 43.4 43.9 46.2* 43.6 49.1*  MCV 90.2 90.3 91.5 91.0 89.1  PLT 192 195 207 194 [id number]   Basic Metabolic Panel: Recent Labs  Lab 06/01/19 1939 06/03/19 0655 06/04/19 0415 06/05/19 0732 06/21/2019 0900  NA 137 141 144 143 139  K 3.6 3.6 3.6 4.0 4.6  CL 100 107 108 109 104  CO2 25 22 25 26 22   GLUCOSE 93 110* 106* 122* 216*  BUN 17 21 24* 22 36*  CREATININE 0.76 0.70 0.72 0.55 1.21*  CALCIUM 8.6* 8.3* 8.5* 8.2* 8.1*  MG  --  2.3 2.6* 2.3  --   PHOS  --  3.3 3.5 3.5  --    GFR: Estimated Creatinine Clearance: 43 mL/min (A) (by C-G formula based on SCr of 1.21 mg/dL (H)). Liver Function Tests: Recent Labs  Lab 06/01/19 1939 06/03/19 0655 06/04/19 0415 06/05/19 0732 06/12/2019 0900  AST 23 23 22 19 21   ALT 17 22 21 21  33  ALKPHOS 89 72 77 62 70  BILITOT 0.9 0.6 0.8 0.6 1.1  PROT 7.6 7.4 7.3 6.7 6.5  ALBUMIN 3.8 3.4* 3.4* 2.9* 2.8*   No results for input(s): LIPASE, AMYLASE in the last 168 hours. No results for input(s): AMMONIA in the last 168 hours. Coagulation Profile: Recent Labs  Lab 06/02/2019 0900  INR 1.3*   Cardiac Enzymes: No results for input(s): CKTOTAL, CKMB, CKMBINDEX, TROPONINI in the last 168 hours. BNP (last 3 results) No results for input(s): PROBNP in the last 8760 hours. HbA1C: No results for input(s): HGBA1C in the last 72 hours. CBG: Recent Labs  Lab 06/23/2019 1658  GLUCAP 243*   Lipid Profile: No results for input(s): CHOL, HDL, LDLCALC, TRIG, CHOLHDL, LDLDIRECT in the last  72 hours. Thyroid Function Tests: No  results for input(s): TSH, T4TOTAL, FREET4, T3FREE, THYROIDAB in the last 72 hours. Anemia Panel: No results for input(s): VITAMINB12, FOLATE, FERRITIN, TIBC, IRON, RETICCTPCT in the last 72 hours. Urine analysis:    Component Value Date/Time   COLORURINE YELLOW (A) 06/16/2019 0955   APPEARANCEUR HAZY (A) 06/04/2019 0955   LABSPEC 1.023 06/21/2019 0955   PHURINE 6.0 05/31/2019 0955   GLUCOSEU NEGATIVE 06/23/2019 0955   HGBUR NEGATIVE 05/30/2019 0955   HGBUR small 03/25/2009 1510   BILIRUBINUR NEGATIVE 06/07/2019 0955   BILIRUBINUR Negative 05/28/2019 0928   KETONESUR NEGATIVE 06/18/2019 0955   PROTEINUR NEGATIVE 05/24/2019 0955   UROBILINOGEN 1.0 05/28/2019 0928   UROBILINOGEN 1.0 09/26/2014 1035   NITRITE NEGATIVE 05/29/2019 0955   LEUKOCYTESUR NEGATIVE 06/20/2019 0955   Sepsis Labs: @LABRCNTIP (procalcitonin:4,lacticidven:4) ) Recent Results (from the past 240 hour(s))  Blood culture (routine x 2)     Status: None   Collection Time: 06/01/19  7:39 PM   Specimen: BLOOD  Result Value Ref Range Status   Specimen Description BLOOD RIGHT ANTECUBITAL  Final   Special Requests   Final    BOTTLES DRAWN AEROBIC AND ANAEROBIC Blood Culture adequate volume   Culture   Final    NO GROWTH 5 DAYS Performed at [facility], [address] [place], [place] [address]    Report Status 06/06/2019 FINAL  Final  Urine culture     Status: Abnormal   Collection Time: 06/01/19  8:36 PM   Specimen: Urine, Catheterized  Result Value Ref Range Status   Specimen Description   Final    URINE, CATHETERIZED Performed at [facility], [address] [place], [place] [address]    Special Requests   Final    NONE Performed at [facility], [address] [place], [place] [address]    Culture >=100,000 COLONIES/mL ESCHERICHIA COLI (A)  Final   Report Status 06/04/2019 FINAL  Final   Organism ID, Bacteria ESCHERICHIA COLI (A)  Final      Susceptibility    Escherichia coli - MIC*    AMPICILLIN 16 INTERMEDIATE Intermediate     CEFAZOLIN <=4 SENSITIVE Sensitive     CEFTRIAXONE <=0.25 SENSITIVE Sensitive     CIPROFLOXACIN <=0.25 SENSITIVE Sensitive     GENTAMICIN <=1 SENSITIVE Sensitive     IMIPENEM <=0.25 SENSITIVE Sensitive     NITROFURANTOIN <=16 SENSITIVE Sensitive     TRIMETH/SULFA <=20 SENSITIVE Sensitive     AMPICILLIN/SULBACTAM 8 SENSITIVE Sensitive     PIP/TAZO <=4 SENSITIVE Sensitive     * >=100,000 COLONIES/mL ESCHERICHIA COLI  SARS CORONAVIRUS 2 (TAT 6-24 HRS) Nasopharyngeal Nasopharyngeal Swab     Status: Abnormal   Collection Time: 06/01/19 10:42 PM   Specimen: Nasopharyngeal Swab  Result Value Ref Range Status   SARS Coronavirus 2 POSITIVE (A) NEGATIVE Final    Comment: RESULT CALLED TO, READ BACK BY AND VERIFIED WITH: MALKA SINWANY RN.@1551  ON 1.10.21 BY TCALDWELL MT. (NOTE) SARS-CoV-2 target nucleic acids are DETECTED. The SARS-CoV-2 RNA is generally detectable in upper and lower respiratory specimens during the acute phase of infection. Positive results are indicative of the presence of SARS-CoV-2 RNA. Clinical correlation with patient history and other diagnostic information is  necessary to determine patient infection status. Positive results do not rule out bacterial infection or co-infection with other viruses.  The expected result is Negative. Fact Sheet for Patients: SugarRoll.be Fact Sheet for Healthcare Providers: https://www.woods-mathews.com/ This test is not yet approved  or cleared by the [place] and  has been authorized for detection and/or diagnosis of SARS-CoV-2 by FDA under an Emergency Use Authorization (EUA). This EUA will remain  in effect (meaning this test can b e used) for the duration of the COVID-19 declaration under Section 564(b)(1) of the Act, 21 U.S.C. section 360bbb-3(b)(1), unless the authorization is terminated or revoked  sooner. Performed at [facility], [place] [address] [place], [place] [address]   MRSA PCR Screening     Status: None   Collection Time: 06/02/2019  5:00 PM   Specimen: Nasopharyngeal  Result Value Ref Range Status   MRSA by PCR NEGATIVE NEGATIVE Final    Comment:        The GeneXpert MRSA Assay (FDA approved for NASAL specimens only), is one component of a comprehensive MRSA colonization surveillance program. It is not intended to diagnose MRSA infection nor to guide or monitor treatment for MRSA infections. Performed at [facility], [address], [place], [place] [address]      Radiological Exams on Admission: CT ABDOMEN PELVIS W CONTRAST  Result Date: 05/24/2019 CLINICAL DATA:  Recent hospitalization for COVID-19. Patient now returns with failure to thrive. Abnormal checks x-ray with possible pneumoperitoneum. EXAM: CT ABDOMEN AND PELVIS WITH CONTRAST TECHNIQUE: Multidetector CT imaging of the abdomen and pelvis was performed using the standard protocol following bolus administration of intravenous contrast. CONTRAST:  96mL OMNIPAQUE IOHEXOL 300 MG/ML  SOLN COMPARISON:  One-view chest x-ray 06/18/2019 FINDINGS: Lower chest: Patchy bilateral airspace opacities are noted. Heart is mildly enlarged. Coronary artery calcifications are present. Calcifications are present within the descending thoracic aorta. No significant pleural or pericardial effusion is present. Hepatobiliary: A 2 cm low-density lesion at the dome of the liver stable. 1.3 cm low-density lesion is present in the left lobe of the liver. There is mild diffuse fatty infiltration. The common bile duct and gallbladder normal. Pancreas: Unremarkable. No pancreatic ductal dilatation or surrounding inflammatory changes. Spleen: Normal in size without focal abnormality. Adrenals/Urinary Tract: A 1.4 cm nodule in the left adrenal gland is stable. The right adrenal gland is normal. A 1.2 cm cyst is present posteriorly  in the left kidney. No other renal lesions are present. Ureters are within normal limits. The urinary bladder is normal. Stomach/Bowel: The stomach and duodenum are within limits. The small bowel is unremarkable. Diverticular changes are present throughout the colon. Inflammatory change about the colon begin at the splenic flexure. More profound phlegm a tori changes are present in the sigmoid colon. This may be the source of perforation. Free air and fluid are present along the base of the colonic mesentery. Vascular/Lymphatic: Atherosclerotic calcifications present in the aorta and branch vessels without aneurysm. No significant retroperitoneal adenopathy is present. Reproductive: Status post hysterectomy. No adnexal masses. Other: Free fluid and free air present throughout the abdomen. There is air under both hemidiaphragms. Discrete etiology of perforation is evident. Prominent locules of air are noted along the sigmoid colon. Musculoskeletal: Levoconvex curvature is centered at L2. Multilevel degenerative changes noted. Schmorl's nodes are present. Vertebral body heights are maintained. No acute fractures are present. Asymmetric endplate sclerotic changes are present on the right at L2-3 and L3-4. Grade 1 anterolisthesis present at L4-5 and to a greater extent at L5-S1. No focal lytic or blastic lesions are present. The patient is status post bilateral hip replacement. IMPRESSION: 1. Free air and fluid throughout the abdomen compatible with bowel perforation. 2. Extensive inflammatory changes about the colon  beginning at the splenic flexure. Changes are most profound at the sigmoid colon, consistent with diverticulitis. This is most likely source of perforation. 3. Diffuse colonic diverticulosis. 4. Atherosclerosis. 5. Stable low-density lesions in the liver. These are likely benign. 6. Patchy bilateral airspace disease likely reflects atelectasis or infection. 7. Multilevel degenerative changes and scoliosis  of the lumbar spine. Electronically Signed   By: San Morelle M.D.   On: 06/01/2019 10:42   DG Chest Port 1 View  Result Date: 05/30/2019 CLINICAL DATA:  COVID pneumonia. EXAM: PORTABLE CHEST 1 VIEW COMPARISON:  06/01/2019 FINDINGS: Stable mild cardiac enlargement and tortuous ectatic calcified thoracic aorta. Lower lung volumes with bibasilar atelectasis versus less likely infiltrates. Findings suspicious for free air under the right hemidiaphragm. Recommend correlation with any abdominal symptoms. A left-side-down right-side-up decubitus abdominal film may be helpful for further evaluation. The bony structures are intact. Stable severe shoulder joint degenerative changes with appearance typical for rheumatoid arthritis. IMPRESSION: 1. Suspect free air under the right hemidiaphragm. Recommend left-side-down/right-side-up decubitus abdominal film. 2. Low lung volumes with bibasilar atelectasis versus less likely infiltrates. These results will be called to the ordering clinician or representative by the Radiologist Assistant, and communication documented in the PACS or zVision Dashboard. Electronically Signed   By: Marijo Sanes M.D.   On: 06/11/2019 09:45     EKG: Independently reviewed.  Sinus rhythm, QTC 465, LAE, tachycardia, LAD.    Assessment/Plan Principal Problem:   Perforated bowel ([place]) Active Problems:   HYPERCHOLESTEROLEMIA   Diastolic dysfunction   Coronary artery disease involving native coronary artery of native heart with unstable angina pectoris (HCC)   Acute metabolic encephalopathy   Pneumonia due to COVID-19 virus   Sepsis ([place])   AKI (acute kidney injury) ([place])   Sepsis due to perforated bowel [facility]): possibly due to sigmoid colon diverticulitis as suggested by CT scan.  Patient meets criteria for sepsis with leukocytosis, fever, tachycardia and tachypnea.  Lactic acid is elevated at 2.3.  Currently hemodynamically stable.  General surgeon, Dr. Peyton Najjar will do  surgery.  -will admit to [facility] as inpt -NPO -IV zosyn -blood culture -IVF: 1L NS, then 75 cc/h -trend lactic acid -prn morphine for pain and Zofran for nausea  Acute metabolic encephalopathy: Etiology is not clear.  Patient will need CT of her head, but the patient is not stable enough to get CT head now.  -Frequent neuro check -Get CT-head  When pt is stabilized. -Keep pt NPO and hold all oral meds until mental status improves  HYPERCHOLESTEROLEMIA: -Hold hold crestor until mental status improves  Diastolic dysfunction: 2D echo on 07/07/2017 showed EF of 50-55%.  Patient does not have leg edema.  Sepsis seem to be compensated. -Watch volume status closely  Coronary artery disease involving native coronary artery of native heart with unstable angina pectoris ([place]): -hold crestor  Resent pneumonia due to COVID-19 virus: pt is still on Decadron -Switch Decadron to IV Solu-Medrol 40 mg twice daily -Continue bronchodilators  Mild AKI (acute kidney injury) ([place]):  -on IVF    DVT ppx: SCD Code Status: Full code per her duaghter Family Communication:  Yes, patient's daughter by phone Disposition Plan:  Anticipate discharge back to previous home environment Consults called: Education officer, environmental, Dr. Peyton Najjar Admission status: SDU/inpation       Date of Service 06/15/2019    [facility]   If 7PM-7AM, please contact night-coverage [email or web address] Password [facility] 06/07/2019, 6:47 PM

## 2019-06-08 NOTE — Consult Note (Signed)
Name: Susan [name] MRN: [id number] DOB: [birth year]    ADMISSION DATE:  06/04/2019 CONSULTATION DATE: 05/28/2019  REFERRING MD : Dr. Ferrel Logan   CHIEF COMPLAINT: Failure to Thrive   BRIEF PATIENT DESCRIPTION:  79 yo female recently treated for COVID-19 pneumonia now admitted with septic shock secondary to diverticular disease with perforation s/p hartmann's procedure remained mechanically intubated postop   SIGNIFICANT EVENTS/STUDIES:  01/16: CT Abd Pelvis revealed free air and fluid throughout the abdomen compatible with bowel perforation. Extensive inflammatory changes about the colon beginning at the splenic flexure. Changes are most profound at the sigmoid colon, consistent with diverticulitis. This is most likely source of perforation. Diffuse colonic diverticulosis. Atherosclerosis. Stable low-density lesions in the liver. These are likely benign. Patchy bilateral airspace disease likely reflects atelectasis or infection. Multilevel degenerative changes and scoliosis of the lumbar spine.  01/16: General surgery consulted pt transported for emergent surgery 01/16: Pt admitted with diverticular disease with perforation s/p hartmann's procedure remained mechanically intubated postop  HISTORY OF PRESENT ILLNESS:   This is a 79 yo female with a PMH of Ventral Hernia, UTI, Osteoarthritis, Mixed Alzheimer's and Vascular Dementia, Frequent Falls, OSA, Non-Obstructive CAD, Morbid Obesity, Herpes Simplex, Hematochezia, Diverticulosis of Colon, Chronic Diastolic CHF, Cerebral Amyloid Angiography, Asthma, Arthritis, and Peptic Ulcer Disease.  She presented to [facility] ER via EMS on 01/16 with altered mental status and failure to thrive.  She was recently discharged from [facility] on 01/13 following treatment of COVID-19 and UTI (she received ceftraxone, decadron, and remdesivir).  During current ER presentation lab results revealed glucose 216, BUN 36, creatinine 1.21, BNP 160, lactic acid 2.3, pct 3.19, wbc  13.9, and UA negative.  CXR concerning for free air under the right hemidiaphragm and bibasilar atelectasis vs. infiltrates. CT Abd Pelvis revealed free air and fluid throughout the abdomen compatible with bowel perforation.  Pt received 1L NS bolus and zosyn. General surgery consulted by ER physician, pt transported to OR for emergent surgery. Surgical findings were diverticular disease with perforation requiring sigmoid colon resection with colostomy (Hartmann's Procedure). Pt remained mechanically intubated postop requiring admission to ICU.  PCCM consulted for vent management.    PAST MEDICAL HISTORY :   has a past medical history of Acute peptic ulcer, unspecified site, with hemorrhage and perforation, without mention of obstruction, Arthritis, Asthma, Balance problems, Cerebral amyloid angiopathy ([place]), Chronic airway obstruction, not elsewhere classified, Diastolic dysfunction, Diverticulosis of colon (without mention of hemorrhage), Hematochezia, Herpes simplex without mention of complication, Morbid obesity ([place]), Non-obstructive CAD (coronary artery disease), OSA (obstructive sleep apnea), Osteoarthritis, UTI (urinary tract infection), and Ventral hernia.  has a past surgical history that includes Bunionectomy (Bilateral); Total hip arthroplasty (2007); Appendectomy; Tubal ligation; Cosmetic surgery (1970's); Foot surgery (Bilateral, dec 2015 right ); Total hip arthroplasty; Total knee arthroplasty (Right, 08/08/2014); Total knee arthroplasty (Left, 10/03/2014); Umbilical hernia repair; and RIGHT/LEFT HEART CATH AND CORONARY ANGIOGRAPHY (Bilateral, 07/08/2016). Prior to Admission medications   Medication Sig Start Date End Date Taking? Authorizing Provider  ascorbic acid (VITAMIN C) 500 MG tablet Take 1 tablet (500 mg total) by mouth daily. 06/05/19   Nicole Kindred A, DO  budesonide-formoterol (SYMBICORT) 160-4.5 MCG/ACT inhaler TAKE 2 PUFFS INTO LUNGS TWICE A DAY Patient taking differently: Inhale  2 puffs into the lungs 2 (two) times daily.  05/28/19   Bedsole, Amy E, MD  dexamethasone (DECADRON) 6 MG tablet Take 1 tablet (6 mg total) by mouth daily for 7 days. 06/05/19 06/12/19  Ezekiel Slocumb, DO  diltiazem (CARDIZEM CD) 180 MG 24 hr capsule Take 1 capsule (180 mg total) by mouth daily. 03/05/19   Minna Merritts, MD  donepezil (ARICEPT) 5 MG tablet Take 5 mg by mouth daily.  05/08/19 05/07/20  [provider]  feeding supplement, ENSURE ENLIVE, (ENSURE ENLIVE) LIQD Take 237 mLs by mouth 3 (three) times daily between meals. 06/05/19   Ezekiel Slocumb, DO  guaiFENesin-dextromethorphan (ROBITUSSIN DM) 100-10 MG/5ML syrup Take 10 mLs by mouth every 4 (four) hours as needed for cough. 06/05/19   Ezekiel Slocumb, DO  Multiple Vitamin (MULTIVITAMIN WITH MINERALS) TABS tablet Take 1 tablet by mouth daily. 06/05/19   Nicole Kindred A, DO  OXYGEN Inhale 2 L into the lungs daily.    [provider]  rosuvastatin (CRESTOR) 5 MG tablet Take 1 tablet (5 mg total) by mouth daily. 03/05/19   Minna Merritts, MD  tiotropium (SPIRIVA) 18 MCG inhalation capsule Place 1 capsule (18 mcg total) into inhaler and inhale daily. 05/28/19   Bedsole, Amy E, MD  traZODone (DESYREL) 50 MG tablet TAKE 1 & 1/2 TABLETS BY MOUTH AT [facility] NEEDED FOR SLEEP Patient taking differently: Take 75 mg by mouth at bedtime as needed for sleep.  04/13/18   Bedsole, Amy E, MD  ZOLOFT 25 MG tablet Take 25 mg by mouth daily.  04/10/19   [provider]   No Known Allergies  FAMILY HISTORY:  family history includes Coronary artery disease in her father and mother; Diabetes in her father and mother; Heart disease in her father and mother; Kidney disease in her father; Lung disease in her mother. SOCIAL HISTORY:  reports that she quit smoking about 18 years ago. Her smoking use included cigarettes. She has a 20.00 pack-year smoking history. She has never used smokeless tobacco. She reports that she does  not drink alcohol or use drugs.  REVIEW OF SYSTEMS:   Unable to assess pt intubated   SUBJECTIVE:  Unable to assess pt intubated   VITAL SIGNS: Temp:  [97.4 F (36.3 C)-101.4 F (38.6 C)] 97.4 F (36.3 C) (01/16 1659) Pulse Rate:  [97-115] 101 (01/16 1800) Resp:  [16-36] 16 (01/16 1800) BP: (72-125)/(62-97) 72/62 (01/16 1800) SpO2:  [91 %-100 %] 94 % (01/16 1800) FiO2 (%):  [50 %] 50 % (01/16 1659) Weight:  [96.1 kg-104.3 kg] 96.1 kg (01/16 1659)  PHYSICAL EXAMINATION: General: acutely ill appearing female, NAD mechanically intubated  Neuro: sedated, not following commands, PERRL  HEENT: supple, no JVD  Cardiovascular: sinus tach, no R/G  Lungs: diminished throughout, even, non labored  Abdomen: no audible BS, soft, midline incision with wound vac intact, colostomy present  Musculoskeletal: normal bulk and tone, no edema  Skin: midline incision with wound vac intact, colostomy present    Recent Labs  Lab 06/04/19 0415 06/05/19 0732 06/02/2019 0900  NA 144 143 139  K 3.6 4.0 4.6  CL 108 109 104  CO2 _0 BUN 24* 22 36*  CREATININE 0.72 0.55 1.21*  GLUCOSE 106* 122* 216*   Recent Labs  Lab 06/04/19 0415 06/05/19 0732 06/09/2019 0900  HGB 14.7 14.0 16.1*  HCT 46.2* 43.6 49.1*  WBC 7.5 5.9 13.9*  PLT 207 194 237   CT ABDOMEN PELVIS W CONTRAST  Result Date: 05/26/2019 CLINICAL DATA:  Recent hospitalization for COVID-19. Patient now returns with failure to thrive. Abnormal checks x-ray with possible pneumoperitoneum. EXAM: CT ABDOMEN AND PELVIS WITH CONTRAST TECHNIQUE: Multidetector CT imaging of the abdomen and  pelvis was performed using the standard protocol following bolus administration of intravenous contrast. CONTRAST:  33m OMNIPAQUE IOHEXOL 300 MG/ML  SOLN COMPARISON:  One-view chest x-ray 06/11/2019 FINDINGS: Lower chest: Patchy bilateral airspace opacities are noted. Heart is mildly enlarged. Coronary artery calcifications are present. Calcifications are  present within the descending thoracic aorta. No significant pleural or pericardial effusion is present. Hepatobiliary: A 2 cm low-density lesion at the dome of the liver stable. 1.3 cm low-density lesion is present in the left lobe of the liver. There is mild diffuse fatty infiltration. The common bile duct and gallbladder normal. Pancreas: Unremarkable. No pancreatic ductal dilatation or surrounding inflammatory changes. Spleen: Normal in size without focal abnormality. Adrenals/Urinary Tract: A 1.4 cm nodule in the left adrenal gland is stable. The right adrenal gland is normal. A 1.2 cm cyst is present posteriorly in the left kidney. No other renal lesions are present. Ureters are within normal limits. The urinary bladder is normal. Stomach/Bowel: The stomach and duodenum are within limits. The small bowel is unremarkable. Diverticular changes are present throughout the colon. Inflammatory change about the colon begin at the splenic flexure. More profound phlegm a tori changes are present in the sigmoid colon. This may be the source of perforation. Free air and fluid are present along the base of the colonic mesentery. Vascular/Lymphatic: Atherosclerotic calcifications present in the aorta and branch vessels without aneurysm. No significant retroperitoneal adenopathy is present. Reproductive: Status post hysterectomy. No adnexal masses. Other: Free fluid and free air present throughout the abdomen. There is air under both hemidiaphragms. Discrete etiology of perforation is evident. Prominent locules of air are noted along the sigmoid colon. Musculoskeletal: Levoconvex curvature is centered at L2. Multilevel degenerative changes noted. Schmorl's nodes are present. Vertebral body heights are maintained. No acute fractures are present. Asymmetric endplate sclerotic changes are present on the right at L2-3 and L3-4. Grade 1 anterolisthesis present at L4-5 and to a greater extent at L5-S1. No focal lytic or blastic  lesions are present. The patient is status post bilateral hip replacement. IMPRESSION: 1. Free air and fluid throughout the abdomen compatible with bowel perforation. 2. Extensive inflammatory changes about the colon beginning at the splenic flexure. Changes are most profound at the sigmoid colon, consistent with diverticulitis. This is most likely source of perforation. 3. Diffuse colonic diverticulosis. 4. Atherosclerosis. 5. Stable low-density lesions in the liver. These are likely benign. 6. Patchy bilateral airspace disease likely reflects atelectasis or infection. 7. Multilevel degenerative changes and scoliosis of the lumbar spine. Electronically Signed   By: CSan MorelleM.D.   On: 06/20/2019 10:42   DG Chest Port 1 View  Result Date: 06/01/2019 CLINICAL DATA:  COVID pneumonia. EXAM: PORTABLE CHEST 1 VIEW COMPARISON:  06/01/2019 FINDINGS: Stable mild cardiac enlargement and tortuous ectatic calcified thoracic aorta. Lower lung volumes with bibasilar atelectasis versus less likely infiltrates. Findings suspicious for free air under the right hemidiaphragm. Recommend correlation with any abdominal symptoms. A left-side-down right-side-up decubitus abdominal film may be helpful for further evaluation. The bony structures are intact. Stable severe shoulder joint degenerative changes with appearance typical for rheumatoid arthritis. IMPRESSION: 1. Suspect free air under the right hemidiaphragm. Recommend left-side-down/right-side-up decubitus abdominal film. 2. Low lung volumes with bibasilar atelectasis versus less likely infiltrates. These results will be called to the ordering clinician or representative by the Radiologist Assistant, and communication documented in the PACS or zVision Dashboard. Electronically Signed   By: PMarijo SanesM.D.   On: 05/24/2019 09:45  ASSESSMENT / PLAN:  Acute hypercapnic respiratory failure secondary to recent COVID-19 pneumonia and sedating medications   Mechanical intubation  Hx: OSA, Asthma, COPD, ILD, and Morbid Obesity  Full vent support for now-vent settings reviewed and established  SBT once all parameters met  VAP bundle implemented  Scheduled and prn bronchodilator therapy  IV steroid: wean as tolerated  Trend inflammatory markers  Maintain airborne and contact precautions   Hypotension secondary to septic shock and hypovolemia  Hx: Non-obstructive CAD, and Chronic diastolic CHF Continuous telemetry monitoring  Fluid resuscitation to maintain map >65, if pt remains hypotensive will start levophed gtt  Hold outpatient cardizem   Acute renal failure secondary to septic shock  Lactic acidosis  Trend BMP and lactic acid  Replace electrolytes as indicated  Monitor UOP Avoid nephrotoxic medications   Transaminitis secondary to sepsis  Trend hepatic panel   Septic shock secondary to diverticular disease with perforation s/p hartmann's procedure-06/18/2019 Trend WBC and monitor fever curve  Trend PCT  Follow cultures  Continue zosyn   Steroid induced hyperglycemia  CBG's q4hrs  SSI   Postop pain  Mechanical intubation pain/discomfort  Hx: Stroke, Cerebral Amyloid Angiopathy, and Mixed Alzheimer's and Vascular Dementia Maintain RASS goal -1 to -2 Propofol and fentanyl gtts to maintain RASS goal and for pain management  WUA daily  Best Practice: VTE px: SCD's SUP px: iv protonix  Diet: keep NPO for now will defer to general surgery   Marda Stalker, [place] Pager [phone] (please enter 7 digits) PCCM Consult Pager [phone] (please enter 7 digits)

## 2019-06-09 DIAGNOSIS — J9621 Acute and chronic respiratory failure with hypoxia: Secondary | ICD-10-CM

## 2019-06-09 LAB — HEPATIC FUNCTION PANEL
ALT: 58 U/L — ABNORMAL HIGH (ref 0–44)
AST: 61 U/L — ABNORMAL HIGH (ref 15–41)
Albumin: 2 g/dL — ABNORMAL LOW (ref 3.5–5.0)
Alkaline Phosphatase: 43 U/L (ref 38–126)
Bilirubin, Direct: 1.5 mg/dL — ABNORMAL HIGH (ref 0.0–0.2)
Indirect Bilirubin: 0.8 mg/dL (ref 0.3–0.9)
Total Bilirubin: 2.3 mg/dL — ABNORMAL HIGH (ref 0.3–1.2)
Total Protein: 5.1 g/dL — ABNORMAL LOW (ref 6.5–8.1)

## 2019-06-09 LAB — BASIC METABOLIC PANEL
Anion gap: 5 (ref 5–15)
BUN: 49 mg/dL — ABNORMAL HIGH (ref 8–23)
CO2: 24 mmol/L (ref 22–32)
Calcium: 7.2 mg/dL — ABNORMAL LOW (ref 8.9–10.3)
Chloride: 112 mmol/L — ABNORMAL HIGH (ref 98–111)
Creatinine, Ser: 1.76 mg/dL — ABNORMAL HIGH (ref 0.44–1.00)
GFR calc Af Amer: 32 mL/min — ABNORMAL LOW (ref >60)
GFR calc non Af Amer: 27 mL/min — ABNORMAL LOW (ref >60)
Glucose, Bld: 174 mg/dL — ABNORMAL HIGH (ref 70–99)
Potassium: 4.7 mmol/L (ref 3.5–5.1)
Sodium: 141 mmol/L (ref 135–145)

## 2019-06-09 LAB — GLUCOSE, CAPILLARY
Glucose-Capillary: 119 mg/dL — ABNORMAL HIGH (ref 70–99)
Glucose-Capillary: 130 mg/dL — ABNORMAL HIGH (ref 70–99)
Glucose-Capillary: 136 mg/dL — ABNORMAL HIGH (ref 70–99)
Glucose-Capillary: 161 mg/dL — ABNORMAL HIGH (ref 70–99)
Glucose-Capillary: 167 mg/dL — ABNORMAL HIGH (ref 70–99)
Glucose-Capillary: 170 mg/dL — ABNORMAL HIGH (ref 70–99)

## 2019-06-09 LAB — CBC WITH DIFFERENTIAL/PLATELET
Abs Immature Granulocytes: 0.13 10*3/uL — ABNORMAL HIGH (ref 0.00–0.07)
Basophils Absolute: 0.1 10*3/uL (ref 0.0–0.1)
Basophils Relative: 0 %
Eosinophils Absolute: 0 10*3/uL (ref 0.0–0.5)
Eosinophils Relative: 0 %
HCT: 42.4 % (ref 36.0–46.0)
Hemoglobin: 13.4 g/dL (ref 12.0–15.0)
Immature Granulocytes: 1 %
Lymphocytes Relative: 3 %
Lymphs Abs: 0.4 10*3/uL — ABNORMAL LOW (ref 0.7–4.0)
MCH: 28.9 pg (ref 26.0–34.0)
MCHC: 31.6 g/dL (ref 30.0–36.0)
MCV: 91.6 fL (ref 80.0–100.0)
Monocytes Absolute: 0.5 10*3/uL (ref 0.1–1.0)
Monocytes Relative: 4 %
Neutro Abs: 12 10*3/uL — ABNORMAL HIGH (ref 1.7–7.7)
Neutrophils Relative %: 92 %
Platelets: 152 10*3/uL (ref 150–400)
RBC: 4.63 MIL/uL (ref 3.87–5.11)
RDW: 15.8 % — ABNORMAL HIGH (ref 11.5–15.5)
Smear Review: NORMAL
WBC: 13.1 10*3/uL — ABNORMAL HIGH (ref 4.0–10.5)
nRBC: 0 % (ref 0.0–0.2)

## 2019-06-09 LAB — MAGNESIUM: Magnesium: 2.1 mg/dL (ref 1.7–2.4)

## 2019-06-09 LAB — HEMOGLOBIN A1C
Hgb A1c MFr Bld: 6.1 % — ABNORMAL HIGH (ref 4.8–5.6)
Mean Plasma Glucose: 128.37 mg/dL

## 2019-06-09 LAB — C-REACTIVE PROTEIN: CRP: 26.6 mg/dL — ABNORMAL HIGH (ref <1.0)

## 2019-06-09 LAB — TRIGLYCERIDES: Triglycerides: 119 mg/dL (ref <150)

## 2019-06-09 LAB — PHOSPHORUS: Phosphorus: 3.8 mg/dL (ref 2.5–4.6)

## 2019-06-09 MED ORDER — VECURONIUM BROMIDE 10 MG IV SOLR
INTRAVENOUS | Status: AC
  Filled 2019-06-09: qty 10

## 2019-06-09 MED ORDER — SODIUM CHLORIDE 0.9% FLUSH: 10.0000 mL | INTRAVENOUS | Status: DC | PRN

## 2019-06-09 MED ORDER — FAMOTIDINE IN NACL 20-0.9 MG/50ML-% IV SOLN
20.0000 mg | INTRAVENOUS | Status: DC
  Administered 2019-06-09 – 2019-06-13 (×5): 20 mg via INTRAVENOUS
  Filled 2019-06-09 (×5): qty 50

## 2019-06-09 MED ORDER — DOPAMINE-DEXTROSE 3.2-5 MG/ML-% IV SOLN
0.0000 ug/kg/min | INTRAVENOUS | Status: DC
  Administered 2019-06-10: 5 ug/kg/min via INTRAVENOUS
  Filled 2019-06-09: qty 250

## 2019-06-09 MED ORDER — MIDAZOLAM BOLUS VIA INFUSION
4.0000 mg | Freq: Once | INTRAVENOUS | Status: AC
  Administered 2019-06-09: 4 mg via INTRAVENOUS
  Filled 2019-06-09: qty 4

## 2019-06-09 MED ORDER — FLUCONAZOLE IN SODIUM CHLORIDE 200-0.9 MG/100ML-% IV SOLN
200.0000 mg | Freq: Every day | INTRAVENOUS | Status: DC
  Administered 2019-06-10 – 2019-06-12 (×3): 200 mg via INTRAVENOUS
  Filled 2019-06-09 (×3): qty 100

## 2019-06-09 MED ORDER — ALBUMIN HUMAN 25 % IV SOLN
12.5000 g | Freq: Once | INTRAVENOUS | Status: AC
  Administered 2019-06-09: 12.5 g via INTRAVENOUS

## 2019-06-09 MED ORDER — MIDAZOLAM BOLUS VIA INFUSION
2.0000 mg | INTRAVENOUS | Status: DC | PRN
  Filled 2019-06-09: qty 2

## 2019-06-09 MED ORDER — FLUCONAZOLE IN SODIUM CHLORIDE 400-0.9 MG/200ML-% IV SOLN
400.0000 mg | Freq: Once | INTRAVENOUS | Status: AC
  Administered 2019-06-09: 400 mg via INTRAVENOUS
  Filled 2019-06-09: qty 200

## 2019-06-09 MED ORDER — SODIUM CHLORIDE 0.9% FLUSH
10.0000 mL | Freq: Two times a day (BID) | INTRAVENOUS | Status: DC
  Administered 2019-06-09 – 2019-06-13 (×9): 10 mL

## 2019-06-09 MED ORDER — VECURONIUM BROMIDE 10 MG IV SOLR
10.0000 mg | Freq: Once | INTRAVENOUS | Status: AC
  Administered 2019-06-09: 10 mg via INTRAVENOUS

## 2019-06-09 MED ORDER — MIDAZOLAM 50MG/50ML (1MG/ML) PREMIX INFUSION
0.5000 mg/h | INTRAVENOUS | Status: DC
  Administered 2019-06-09: 0.5 mg/h via INTRAVENOUS
  Administered 2019-06-10: 2 mg/h via INTRAVENOUS
  Filled 2019-06-09 (×3): qty 50

## 2019-06-09 MED ORDER — HYDROCORTISONE NA SUCCINATE PF 100 MG IJ SOLR
100.0000 mg | Freq: Three times a day (TID) | INTRAMUSCULAR | Status: AC
  Administered 2019-06-09 – 2019-06-11 (×7): 100 mg via INTRAVENOUS
  Filled 2019-06-09 (×6): qty 2

## 2019-06-09 MED ORDER — HEPARIN SODIUM (PORCINE) 5000 UNIT/ML IJ SOLN
5000.0000 [IU] | Freq: Three times a day (TID) | INTRAMUSCULAR | Status: DC
  Administered 2019-06-09 – 2019-06-14 (×15): 5000 [IU] via SUBCUTANEOUS
  Filled 2019-06-09 (×15): qty 1

## 2019-06-09 NOTE — Progress Notes (Addendum)
Patient ID: Susan [name], female   DOB: Jul 24, 1940, 79 y.o.   MRN: [id number]     [place] Hospital Day(s): 1.   Post op day(s): 1 Day Post-Op.   Interval History: Patient seen and examined, no acute events or new complaints overnight.  Patient sedated and on mechanical ventilation.  Patient declined with nurse and there was no major events during the night other than requiring 1.5 L of fluid bolus.  Otherwise there have been able to maintain the blood pressure with a MAP above 65 without the need of vasopressors.  Vital signs in last 24 hours: [min-max] current  Temp:  [97.4 F (36.3 C)-99.7 F (37.6 C)] 98.5 F (36.9 C) (01/17 0800) Pulse Rate:  [91-113] 92 (01/17 0900) Resp:  [16-31] 16 (01/17 0900) BP: (72-124)/(51-82) 96/56 (01/17 0900) SpO2:  [91 %-100 %] 93 % (01/17 0900) FiO2 (%):  [50 %] 50 % (01/17 0300) Weight:  [96.1 kg-99.2 kg] 99.2 kg (01/17 0500)     Height: 5\' 3"  (160 cm) Weight: 99.2 kg BMI (Calculated): 38.75    Physical Exam:  Constitutional: Sedated and no distress  Respiratory: breathing non-labored at rest on mechanical ventilation Cardiovascular: regular rate and sinus rhythm  Gastrointestinal: soft, non-tender, and non-distended. Ostomy with viable mucosa. No stool or gas in bag. Midline covered by Preveena dressing.   Labs:  CBC Latest Ref Rng & Units 06/09/2019 06/07/2019 06/06/2019  WBC 4.0 - 10.5 K/uL 13.1(H) 16.4(H) 13.9(H)  Hemoglobin 12.0 - 15.0 g/dL 13.4 14.8 16.1(H)  Hematocrit 36.0 - 46.0 % 42.4 47.2(H) 49.1(H)  Platelets 150 - 400 K/uL 152 217 237   CMP Latest Ref Rng & Units 06/09/2019 06/05/2019 06/12/2019  Glucose 70 - 99 mg/dL 174(H) 213(H) 216(H)  BUN 8 - 23 mg/dL 49(H) 42(H) 36(H)  Creatinine 0.44 - 1.00 mg/dL 1.76(H) 1.71(H) 1.21(H)  Sodium 135 - 145 mmol/L 141 137 139  Potassium 3.5 - 5.1 mmol/L 4.7 5.0 4.6  Chloride 98 - 111 mmol/L 112(H) 108 104  CO2 22 - 32 mmol/L 24 22 22   Calcium 8.9 - 10.3 mg/dL 7.2(L) 7.3(L)  8.1(L)  Total Protein 6.5 - 8.1 g/dL 5.1(L) 5.3(L) 6.5  Total Bilirubin 0.3 - 1.2 mg/dL 2.3(H) 1.2 1.1  Alkaline Phos 38 - 126 U/L 43 53 70  AST 15 - 41 U/L 61(H) 73(H) 21  ALT 0 - 44 U/L 58(H) 60(H) 33    Imaging studies: No new pertinent imaging studies   Assessment/Plan:  79 yo female with perforated diverticulitis s/p partial colectomy with end colostomy and intra-abdominal abscess drainage postop day #1 with current medical comorbilities including: recent COVID-19 virus infection, baseline dementia as per daughter, asthma, COPD, CAD.   Patient continued to be critically ill, sedated on mechanical ventilation.  At least there has been no deterioration of her condition since surgery.  Today with adequate white blood cell count, stable hemoglobin.  Patient with acute kidney injury.  I agree with current management with aggressive IV fluid resuscitation, IV antibiotics and medical management pain of comorbidities.  No contraindication to DVT prophylaxis from my standpoint.  We will continue n.p.o. with NGT in place on the way have signs of bowel function at the colostomy site.  We will continue with Praveena dressing at the midline wound.  Daughter Reubin Milan) called and updated of patient current status.   Arnold Long, MD

## 2019-06-09 NOTE — Progress Notes (Signed)
Pharmacy Antibiotic Note  Johnine Arensdorf Angelico is a 79 y.o. female admitted on 06/22/2019 with sepsis secondary to perforated bowel. Patient had emergent exploratory laparotomy and is now admitted to ICU sedated and intubated. Patient with COVID diagnosis on 1.9. Patient is on day 2 of Zosyn.  Pharmacy has been consulted for Fluconazole dosing.  Plan: Fluconazole 400mg  IV x 1 followed by fluconazole 200mg  IV Q24hr.   Height: 5\' 3"  (160 cm) Weight: 218 lb 11.1 oz (99.2 kg) IBW/kg (Calculated) : 52.4  Temp (24hrs), Avg:98.4 F (36.9 C), Min:97.4 F (36.3 C), Max:98.9 F (37.2 C)  Recent Labs  Lab 06/04/19 0415 06/05/19 0732 06/07/2019 0900 05/27/2019 1719 06/20/2019 1919 06/07/2019 2017 06/18/2019 2215 06/09/19 0608  WBC 7.5 5.9 13.9*  --   --  16.4*  --  13.1*  CREATININE 0.72 0.55 1.21*  --   --  1.71*  --  1.76*  LATICACIDVEN  --   --  2.3* 3.3* 2.3*  --  2.0*  --     Estimated Creatinine Clearance: 30 mL/min (A) (by C-G formula based on SCr of 1.76 mg/dL (H)).    No Known Allergies  Antimicrobials this admission: Zosyn 1/16 >>  Fluconazole 1/17 >>   Dose adjustments this admission: N/A  Microbiology results: 1/16 BCx: no growth < 24 hours  1/16 UCx: reincubated 1/16 MRSA PCR: negative   Thank you for allowing pharmacy to be a part of this patient's care.  Aakash Hollomon L 06/09/2019 1:40 PM

## 2019-06-09 NOTE — Progress Notes (Signed)
Name: Susan [name] MRN: [id number] DOB: [birth year]    ADMISSION DATE:  06/20/2019 INITIAL CONSULTATION DATE: 06/19/2019  REFERRING MD : Dr. Windell Moment   FOLLOW UP:Ventilator dependence, acute on chronic respiratory failure with hypoxia.  BRIEF PATIENT DESCRIPTION:  79 yo female recently treated for COVID-19 pneumonia now admitted with septic shock secondary to diverticular disease with perforation s/p hartmann's procedure remained  intubated mechanically ventilated postop.  She has acute on chronic respiratory failure with hypoxia.  Underlying COPD with chronic oxygen use.  SIGNIFICANT EVENTS/STUDIES:  01/16: CT Abd Pelvis revealed free air and fluid throughout the abdomen compatible with bowel perforation. Extensive inflammatory changes about the colon beginning at the splenic flexure. Changes are most profound at the sigmoid colon, consistent with diverticulitis. This is most likely source of perforation. Diffuse colonic diverticulosis. Atherosclerosis. Stable low-density lesions in the liver. These are likely benign. Patchy bilateral airspace disease likely reflects atelectasis or infection. Multilevel degenerative changes and scoliosis of the lumbar spine.  01/16: General surgery consulted pt transported for emergent surgery 01/16: Pt admitted with diverticular disease with perforation s/p hartmann's procedure remained mechanically ventilated postop, hemodynamics tenuous 01/17:Remains on ventilator, started Dopamine   Prior to Admission medications   Medication Sig Start Date End Date Taking? Authorizing Provider  ascorbic acid (VITAMIN C) 500 MG tablet Take 1 tablet (500 mg total) by mouth daily. 06/05/19   Nicole Kindred A, DO  budesonide-formoterol (SYMBICORT) 160-4.5 MCG/ACT inhaler TAKE 2 PUFFS INTO LUNGS TWICE A DAY Patient taking differently: Inhale 2 puffs into the lungs 2 (two) times daily.  05/28/19   Bedsole, Amy E, MD  dexamethasone (DECADRON) 6 MG tablet Take 1 tablet  (6 mg total) by mouth daily for 7 days. 06/05/19 06/12/19  Ezekiel Slocumb, DO  diltiazem (CARDIZEM CD) 180 MG 24 hr capsule Take 1 capsule (180 mg total) by mouth daily. 03/05/19   Minna Merritts, MD  donepezil (ARICEPT) 5 MG tablet Take 5 mg by mouth daily.  05/08/19 05/07/20  [provider]  feeding supplement, ENSURE ENLIVE, (ENSURE ENLIVE) LIQD Take 237 mLs by mouth 3 (three) times daily between meals. 06/05/19   Ezekiel Slocumb, DO  guaiFENesin-dextromethorphan (ROBITUSSIN DM) 100-10 MG/5ML syrup Take 10 mLs by mouth every 4 (four) hours as needed for cough. 06/05/19   Ezekiel Slocumb, DO  Multiple Vitamin (MULTIVITAMIN WITH MINERALS) TABS tablet Take 1 tablet by mouth daily. 06/05/19   Nicole Kindred A, DO  OXYGEN Inhale 2 L into the lungs daily.    [provider]  rosuvastatin (CRESTOR) 5 MG tablet Take 1 tablet (5 mg total) by mouth daily. 03/05/19   Minna Merritts, MD  tiotropium (SPIRIVA) 18 MCG inhalation capsule Place 1 capsule (18 mcg total) into inhaler and inhale daily. 05/28/19   Bedsole, Amy E, MD  traZODone (DESYREL) 50 MG tablet TAKE 1 & 1/2 TABLETS BY MOUTH AT [facility] NEEDED FOR SLEEP Patient taking differently: Take 75 mg by mouth at bedtime as needed for sleep.  04/13/18   Bedsole, Amy E, MD  ZOLOFT 25 MG tablet Take 25 mg by mouth daily.  04/10/19   [provider]   No Known Allergies   REVIEW OF SYSTEMS:   Unable to assess, pt intubated   SUBJECTIVE:  Unable to assess, pt. intubated   VITAL SIGNS: Temp:  [97.4 F (36.3 C)-99.7 F (37.6 C)] 98.8 F (37.1 C) (01/17 0400) Pulse Rate:  [91-114] 103 (01/17 0700) Resp:  [16-32] 20 (01/17 0700) BP: (  72-125)/(51-97) 98/57 (01/17 0700) SpO2:  [91 %-100 %] 94 % (01/17 0700) FiO2 (%):  [50 %] 50 % (01/17 0300) Weight:  [96.1 kg-99.2 kg] 99.2 kg (01/17 0500)  PHYSICAL EXAMINATION: General:chronically on  acutely ill appearing female, on ventilator, sedated, asynchronous when  sedation lightened Neuro: sedated, not following commands, PERRL  HEENT: Orotracheally intubated, neck supple, trachea midline no crepitus Cardiovascular: sinus tach, no R/G  Lungs: diminished throughout, even, non labored  Abdomen: no audible BS, soft, midline incision with wound vac intact, colostomy present  Musculoskeletal: normal bulk and tone, no edema  Skin: midline incision with wound vac intact, colostomy present    Recent Labs  Lab 05/25/2019 0900 05/28/2019 2017 06/09/19 0608  NA 139 137 141  K 4.6 5.0 4.7  CL 104 108 112*  CO2 _0 BUN 36* 42* 49*  CREATININE 1.21* 1.71* 1.76*  GLUCOSE 216* 213* 174*   Recent Labs  Lab 06/22/2019 0900 06/04/2019 2017 06/09/19 0608  HGB 16.1* 14.8 13.4  HCT 49.1* 47.2* 42.4  WBC 13.9* 16.4* 13.1*  PLT 237 217 152   DG Abd 1 View  Result Date: 06/15/2019 CLINICAL DATA:  Nasogastric tube placement EXAM: ABDOMEN - 1 VIEW COMPARISON:  Same-day CT FINDINGS: Interval placement of nasogastric tube with distal tip and side hole projecting over the gastric body. Nonobstructive bowel gas pattern. Left-sided nephrolithiasis. Known pneumoperitoneum is not well seen on semi upright view. IMPRESSION: Nasogastric tube with distal tip and side hole projecting over the gastric body. Electronically Signed   By: Davina Poke D.O.   On: 06/16/2019 20:24   CT ABDOMEN PELVIS W CONTRAST  Result Date: 05/31/2019 CLINICAL DATA:  Recent hospitalization for COVID-19. Patient now returns with failure to thrive. Abnormal checks x-ray with possible pneumoperitoneum. EXAM: CT ABDOMEN AND PELVIS WITH CONTRAST TECHNIQUE: Multidetector CT imaging of the abdomen and pelvis was performed using the standard protocol following bolus administration of intravenous contrast. CONTRAST:  80m OMNIPAQUE IOHEXOL 300 MG/ML  SOLN COMPARISON:  One-view chest x-ray 06/23/2019 FINDINGS: Lower chest: Patchy bilateral airspace opacities are noted. Heart is mildly enlarged. Coronary  artery calcifications are present. Calcifications are present within the descending thoracic aorta. No significant pleural or pericardial effusion is present. Hepatobiliary: A 2 cm low-density lesion at the dome of the liver stable. 1.3 cm low-density lesion is present in the left lobe of the liver. There is mild diffuse fatty infiltration. The common bile duct and gallbladder normal. Pancreas: Unremarkable. No pancreatic ductal dilatation or surrounding inflammatory changes. Spleen: Normal in size without focal abnormality. Adrenals/Urinary Tract: A 1.4 cm nodule in the left adrenal gland is stable. The right adrenal gland is normal. A 1.2 cm cyst is present posteriorly in the left kidney. No other renal lesions are present. Ureters are within normal limits. The urinary bladder is normal. Stomach/Bowel: The stomach and duodenum are within limits. The small bowel is unremarkable. Diverticular changes are present throughout the colon. Inflammatory change about the colon begin at the splenic flexure. More profound phlegm a tori changes are present in the sigmoid colon. This may be the source of perforation. Free air and fluid are present along the base of the colonic mesentery. Vascular/Lymphatic: Atherosclerotic calcifications present in the aorta and branch vessels without aneurysm. No significant retroperitoneal adenopathy is present. Reproductive: Status post hysterectomy. No adnexal masses. Other: Free fluid and free air present throughout the abdomen. There is air under both hemidiaphragms. Discrete etiology of perforation is evident. Prominent locules of air are  noted along the sigmoid colon. Musculoskeletal: Levoconvex curvature is centered at L2. Multilevel degenerative changes noted. Schmorl's nodes are present. Vertebral body heights are maintained. No acute fractures are present. Asymmetric endplate sclerotic changes are present on the right at L2-3 and L3-4. Grade 1 anterolisthesis present at L4-5 and to  a greater extent at L5-S1. No focal lytic or blastic lesions are present. The patient is status post bilateral hip replacement. IMPRESSION: 1. Free air and fluid throughout the abdomen compatible with bowel perforation. 2. Extensive inflammatory changes about the colon beginning at the splenic flexure. Changes are most profound at the sigmoid colon, consistent with diverticulitis. This is most likely source of perforation. 3. Diffuse colonic diverticulosis. 4. Atherosclerosis. 5. Stable low-density lesions in the liver. These are likely benign. 6. Patchy bilateral airspace disease likely reflects atelectasis or infection. 7. Multilevel degenerative changes and scoliosis of the lumbar spine. Electronically Signed   By: San Morelle M.D.   On: 06/19/2019 10:42   DG Chest Port 1 View  Result Date: 06/17/2019 CLINICAL DATA:  Intubation EXAM: PORTABLE CHEST 1 VIEW COMPARISON:  05/31/2019 at 0850 hours FINDINGS: Interval placement of endotracheal tube with distal tip terminating approximately 3.8 cm superior to the carina. Stable cardiomediastinal contours. Streaky bibasilar opacities, similar to prior. Previously seen free intraperitoneal air is not clearly evident on current semiupright film. IMPRESSION: 1. Satisfactory positioning of endotracheal tube. 2. Stable bibasilar opacities, atelectasis versus infection. Electronically Signed   By: Davina Poke D.O.   On: 06/01/2019 20:22   DG Chest Port 1 View  Result Date: 06/20/2019 CLINICAL DATA:  COVID pneumonia. EXAM: PORTABLE CHEST 1 VIEW COMPARISON:  06/01/2019 FINDINGS: Stable mild cardiac enlargement and tortuous ectatic calcified thoracic aorta. Lower lung volumes with bibasilar atelectasis versus less likely infiltrates. Findings suspicious for free air under the right hemidiaphragm. Recommend correlation with any abdominal symptoms. A left-side-down right-side-up decubitus abdominal film may be helpful for further evaluation. The bony structures  are intact. Stable severe shoulder joint degenerative changes with appearance typical for rheumatoid arthritis. IMPRESSION: 1. Suspect free air under the right hemidiaphragm. Recommend left-side-down/right-side-up decubitus abdominal film. 2. Low lung volumes with bibasilar atelectasis versus less likely infiltrates. These results will be called to the ordering clinician or representative by the Radiologist Assistant, and communication documented in the PACS or zVision Dashboard. Electronically Signed   By: Marijo Sanes M.D.   On: 05/31/2019 09:45    ASSESSMENT / PLAN:  Acute hypercapnic respiratory failure secondary to recent COVID-19 pneumonia and sedating medications  Mechanical intubation  Hx: OSA, Asthma, COPD, ILD, and Morbid Obesity  Full vent support for now-vent settings reviewed with RT SBT once all parameters met  VAP bundle implemented  Scheduled and prn bronchodilator therapy  IV steroid: Switched to hydrocortisone due to shock Trend inflammatory markers  Maintain airborne and contact precautions   Hypotension secondary to septic shock and hypovolemia  Hx: Non-obstructive CAD, and Chronic diastolic CHF Continuous telemetry monitoring  Fluid resuscitation to maintain map >65, if pt remains hypotensive will start dopamine gtt  Hold outpatient cardizem Check 2D echo Stress dose steroids  Acute renal failure secondary to septic shock  Lactic acidosis  Trend BMP and lactic acid  Replace electrolytes as indicated  Monitor UOP Avoid nephrotoxic medications   Transaminitis secondary to sepsis  Trend hepatic panel   Septic shock secondary to diverticular disease with perforation s/p Hartmann's procedure-06/06/2019 Trend WBC and monitor fever curve  Trend PCT  Follow cultures  Continue zosyn, add fluconazole  Steroid induced hyperglycemia  CBG's q4hrs  SSI   Postop pain  Mechanical ventilation pain/discomfort  Hx: Stroke, Cerebral Amyloid Angiopathy, and Mixed  Alzheimer's and Vascular Dementia Maintain RASS goal -1 to -2 Propofol and fentanyl gtts to maintain RASS goal and for pain management  WUA daily This issue will add complexity to her management and weaning abilities  Best Practice: VTE px: SCD's SUP px: iv protonix  Diet: keep NPO for now will defer to general surgery    Critical care time: 45 minutes   C. Derrill Kay, MD [facility]   *This note was dictated using voice recognition software/Dragon.  Despite best efforts to proofread, errors can occur which can change the meaning.  Any change was purely unintentional.

## 2019-06-09 NOTE — Progress Notes (Signed)
Brief medicine note.  Patient is a 79 year old female recently treated for Covid pneumonia now admitted with septic shock secondary to diverticular disease with associated perforation status post Hartman's procedure who was initially admitted to the hospitalist service however remains mechanically intubated postoperatively.  At this time primary care will be transferred to the intensive care unit team.  Appreciate ICU care.  Communicated with bedside RN.  Hospitalist service to sign off at this time.  Will reassume care when patient stable for downgrade from the ICU.  Ralene Muskrat MD

## 2019-06-10 ENCOUNTER — Inpatient Hospital Stay: Payer: Medicare HMO

## 2019-06-10 ENCOUNTER — Inpatient Hospital Stay (HOSPITAL_COMMUNITY)
Admit: 2019-06-10 | Discharge: 2019-06-10 | Disposition: A | Discharge status: Home / Self Care | Payer: Medicare HMO | Attending: Pulmonary Disease | Admitting: Pulmonary Disease

## 2019-06-10 DIAGNOSIS — Z515 Encounter for palliative care: Secondary | ICD-10-CM

## 2019-06-10 DIAGNOSIS — Z7189 Other specified counseling: Secondary | ICD-10-CM

## 2019-06-10 DIAGNOSIS — R579 Shock, unspecified: Secondary | ICD-10-CM

## 2019-06-10 LAB — BASIC METABOLIC PANEL
Anion gap: 10 (ref 5–15)
BUN: 69 mg/dL — ABNORMAL HIGH (ref 8–23)
CO2: 19 mmol/L — ABNORMAL LOW (ref 22–32)
Calcium: 7.2 mg/dL — ABNORMAL LOW (ref 8.9–10.3)
Chloride: 114 mmol/L — ABNORMAL HIGH (ref 98–111)
Creatinine, Ser: 2.02 mg/dL — ABNORMAL HIGH (ref 0.44–1.00)
GFR calc Af Amer: 27 mL/min — ABNORMAL LOW (ref >60)
GFR calc non Af Amer: 23 mL/min — ABNORMAL LOW (ref >60)
Glucose, Bld: 159 mg/dL — ABNORMAL HIGH (ref 70–99)
Potassium: 4.7 mmol/L (ref 3.5–5.1)
Sodium: 143 mmol/L (ref 135–145)

## 2019-06-10 LAB — GLUCOSE, CAPILLARY
Glucose-Capillary: 122 mg/dL — ABNORMAL HIGH (ref 70–99)
Glucose-Capillary: 123 mg/dL — ABNORMAL HIGH (ref 70–99)
Glucose-Capillary: 135 mg/dL — ABNORMAL HIGH (ref 70–99)
Glucose-Capillary: 141 mg/dL — ABNORMAL HIGH (ref 70–99)
Glucose-Capillary: 147 mg/dL — ABNORMAL HIGH (ref 70–99)

## 2019-06-10 LAB — MAGNESIUM: Magnesium: 2.6 mg/dL — ABNORMAL HIGH (ref 1.7–2.4)

## 2019-06-10 LAB — CBC
HCT: 40.1 % (ref 36.0–46.0)
Hemoglobin: 12.4 g/dL (ref 12.0–15.0)
MCH: 29.3 pg (ref 26.0–34.0)
MCHC: 30.9 g/dL (ref 30.0–36.0)
MCV: 94.8 fL (ref 80.0–100.0)
Platelets: 146 10*3/uL — ABNORMAL LOW (ref 150–400)
RBC: 4.23 MIL/uL (ref 3.87–5.11)
RDW: 16.1 % — ABNORMAL HIGH (ref 11.5–15.5)
WBC: 14.2 10*3/uL — ABNORMAL HIGH (ref 4.0–10.5)
nRBC: 0 % (ref 0.0–0.2)

## 2019-06-10 LAB — PHOSPHORUS: Phosphorus: 5.4 mg/dL — ABNORMAL HIGH (ref 2.5–4.6)

## 2019-06-10 LAB — FIBRIN DERIVATIVES D-DIMER (ARMC ONLY): Fibrin derivatives D-dimer (ARMC): >7500 ng/mL (FEU) — ABNORMAL HIGH (ref 0.00–499.00)

## 2019-06-10 LAB — LACTIC ACID, PLASMA: Lactic Acid, Venous: 0.7 mmol/L (ref 0.5–1.9)

## 2019-06-10 LAB — ECHOCARDIOGRAM COMPLETE
Height: 63 in
Weight: 3534.41 oz

## 2019-06-10 MED ORDER — ATROPINE SULFATE 1 MG/ML IJ SOLN: 1.0000 mg | Freq: Once | INTRAMUSCULAR | Status: DC

## 2019-06-10 MED ORDER — ATROPINE SULFATE 1 MG/10ML IJ SOSY: 1.0000 mg | PREFILLED_SYRINGE | INTRAMUSCULAR | Status: DC | PRN

## 2019-06-10 MED ORDER — LACTATED RINGERS IV BOLUS: 1000.0000 mL | Freq: Once | INTRAVENOUS | Status: DC

## 2019-06-10 MED ORDER — GLUCAGON HCL RDNA (DIAGNOSTIC) 1 MG IJ SOLR
1.0000 mg | Freq: Once | INTRAMUSCULAR | Status: AC | PRN
  Administered 2019-06-10: 1 mg via INTRAVENOUS
  Filled 2019-06-10: qty 1

## 2019-06-10 NOTE — Progress Notes (Signed)
*  PRELIMINARY RESULTS* Echocardiogram 2D Echocardiogram   has been performed.  Susan [name] 06/10/2019, 9:43 AM

## 2019-06-10 NOTE — Progress Notes (Signed)
Spoke to patient's daughter [name]- she would like to do the web-cam tonight to see her mother.  She said whatever time is best for the night shift nurse- will relay this info to oncoming RN.

## 2019-06-10 NOTE — Progress Notes (Signed)
Assisted daughter with camera/video time via elink 

## 2019-06-10 NOTE — Progress Notes (Signed)
Patient had episode where she was not in synchrony with the vent and NP ordered several bolus and to go up on Fentanyl and Versed. Fentanyl up to 300 mcg  and back down to 200 during emergent sedation and maintenance needs, and then patient had to have Vec. NP changed Fentanyl order range up to 400 mcg. RT had increased setting to 70% and 10 Peep. Now changing to 1005 and 20 Rate.

## 2019-06-10 NOTE — Progress Notes (Signed)
Spoke with daughter [name] about patient's increasing oxygen needs on the ventilator, her hypotension, then subsequent tachycardia and hypertension, that returned to hypotension, and restarting the dopamine gtt for the blood pressure. Daughter was thankful for the update because she requested updates for any changes earlier in the shift.

## 2019-06-10 NOTE — Progress Notes (Signed)
A recruitment maneuver was performed on this pt. She tolerated it well. The Sa02 increased to 98% and the Fi02 was decreased to 30%.

## 2019-06-10 NOTE — Consult Note (Addendum)
Consultation Note Date: 06/10/2019   Patient Name: Susan [name]  DOB: 1941/03/06  MRN: [id number]  Age / Sex: 79 y.o., female  PCP: [name], MD Referring Physician: Ottie Glazier, MD  Reason for Consultation: Establishing goals of care  HPI/Patient Profile: Patient has extensive comorbidities, recently diagnosed with COVID-19 on 9 January.  She was evaluated by palliative care during that hospitalization.  She has underlying COPD and is home O2 dependent 2 L/min.  She has mixed Alzheimer's and vascular dementia, disabling arthritis and is dependent on a motorized wheelchair.  She has had acute COVID-19 encephalopathy in the setting of underlying dementia.  She was noted to have a diverticular perforation and required emergency laparotomy.  Postoperatively he remained on the vent and was having hemodynamic instability due to sepsis from peritonitis.  Clinical Assessment and Goals of Care: Patient is resting in bed on ventilator with eyes closed. She is on covid isolation with glass ICU door. Spoke with her daughter. She states her mother was home briefly before having to be brought back to the hospital. She states she worries every time the phone rings and it is the hospital.   She states she found her mother's living will and brought a copy to the ED. No paperwork noted in chart. She states she will familiarize herself with the documents to help with decision making in the near future. Discussed her diagnoses and prognosis. She understands the seriousness of her mother's status. Discussed QOL before hospitalization and in the future.  Palliative team will continue to follow for further [place] conversations.        SUMMARY OF RECOMMENDATIONS   Palliative will continue to follow for [place] conversations.   Prognosis:   Poor overall.       Primary Diagnoses: Present on Admission: . Acute metabolic  encephalopathy . Coronary artery disease involving native coronary artery of native heart with unstable angina pectoris ([place]) . HYPERCHOLESTEROLEMIA . Pneumonia due to COVID-19 virus . Perforated bowel ([place]) . Sepsis ([place]) . AKI (acute kidney injury) ([place])   I have reviewed the medical record, interviewed the patient and family, and examined the patient. The following aspects are pertinent.  Past Medical History:  Diagnosis Date  . Acute peptic ulcer, unspecified site, with hemorrhage and perforation, without mention of obstruction   . Arthritis   . Asthma   . Balance problems    unsteady on feet at time  . Cerebral amyloid angiopathy ([place])    a. 06/2017 admit w/ aphasia and blurred vision.  RI w/ extensive chronic microhemorrhages c/w amyloid angiopathy-->ASA initiated.  . Chronic airway obstruction, not elsewhere classified    on inhalers  . Diastolic dysfunction    a. 06/2017 Echo: EF 50-55%, no rwma, Gr1 DD, nl LA size, nl RV fxn.  . Diverticulosis of colon (without mention of hemorrhage)   . Hematochezia   . 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 Morbid obesity ([place])   . Non-obstructive CAD (coronary artery disease)    a. 12/2012  MV: EF 71%, no ischemia; b. 06/2016 Cath: LM nl, LAD 50p, LCX nl, RCA 6m EF 55-65%.  . OSA (obstructive sleep apnea)   . Osteoarthritis    a. s/p bilat hip replacements.  .Marland KitchenUTI (urinary tract infection)    a. 09/2017 admit for E coli & Klebsiella UTI.  . Ventral hernia    Social History   Socioeconomic History  . Marital status: Divorced    Spouse name: Not on file  . Number of children: 3  . Years of education: Not on file  . Highest education level: Not on file  Occupational History  . Occupation: self employeed    Comment: Owns beauty shop  Tobacco Use  . Smoking status: Former Smoker    Packs/day: 0.50    Years: 40.00    Pack years: 20.00    Types: Cigarettes    Quit date: 08/29/2000    Years since quitting: 18.7  .  Smokeless tobacco: Never Used  Substance and Sexual Activity  . Alcohol use: No    Alcohol/week: 0.0 standard drinks    Comment: rare  . Drug use: No  . Sexual activity: Not Currently  Other Topics Concern  . Not on file  Social History Narrative   Diet: Moderate, low fat, skips meals, some fruit and veggies   Regular exercise:  No   HCPOA is [place] [employer] [facility]  Has living will, Not sure of code status (reviewed 2013)   Social Determinants of Health   Financial Resource Strain: Low Risk   . Difficulty of Paying Living Expenses: Not hard at all  Food Insecurity: No Food Insecurity  . Worried About RCharity fundraiserin the Last Year: Never true  . Ran Out of Food in the Last Year: Never true  Transportation Needs: No Transportation Needs  . Lack of Transportation (Medical): No  . Lack of Transportation (Non-Medical): No  Physical Activity: Inactive  . Days of Exercise per Week: 0 days  . Minutes of Exercise per Session: 0 min  Stress: No Stress Concern Present  . Feeling of Stress : Not at all  Social Connections:   . Frequency of Communication with Friends and Family: Not on file  . Frequency of Social Gatherings with Friends and Family: Not on file  . Attends Religious Services: Not on file  . Active Member of Clubs or Organizations: Not on file  . Attends CArchivistMeetings: Not on file  . Marital Status: Not on file   Family History  Problem Relation Age of Onset  . Diabetes Mother   . Coronary artery disease Mother   . Lung disease Mother   . Heart disease Mother        CAD  . Coronary artery disease Father   . Kidney disease Father   . Diabetes Father   . Heart disease Father        CAD  . Cancer Neg Hx    Scheduled Meds: . chlorhexidine gluconate (MEDLINE KIT)  15 mL Mouth Rinse BID  . Chlorhexidine Gluconate Cloth  6 each Topical Daily  . fentaNYL (SUBLIMAZE) injection  25 mcg Intravenous Once  . heparin injection (subcutaneous)   5,000 Units Subcutaneous Q8H  . hydrocortisone sod succinate (SOLU-CORTEF) inj  100 mg Intravenous Q8H  . insulin aspart  0-9 Units Subcutaneous Q4H  . mouth rinse  15 mL Mouth Rinse 10 times per day  . sodium chloride flush  10-40 mL Intracatheter Q12H  Continuous Infusions: . sodium chloride 75 mL/hr at 06/10/19 0219  . DOPamine 5 mcg/kg/min (06/10/19 0154)  . famotidine (PEPCID) IV 20 mg (06/09/19 1304)  . fentaNYL infusion INTRAVENOUS 150 mcg/hr (06/10/19 0500)  . fluconazole (DIFLUCAN) IV 200 mg (06/10/19 1010)  . lactated ringers Stopped (06/10/19 0403)  . midazolam 2 mg/hr (06/10/19 0936)  . piperacillin-tazobactam (ZOSYN)  IV 3.375 g (06/10/19 0541)   PRN Meds:.acetaminophen **OR** acetaminophen, fentaNYL, hydrALAZINE, midazolam, morphine injection, ondansetron **OR** ondansetron (ZOFRAN) IV, sodium chloride flush, traZODone Medications Prior to Admission:  Prior to Admission medications   Medication Sig Start Date End Date Taking? Authorizing Provider  ascorbic acid (VITAMIN C) 500 MG tablet Take 1 tablet (500 mg total) by mouth daily. 06/05/19   Nicole Kindred A, DO  budesonide-formoterol (SYMBICORT) 160-4.5 MCG/ACT inhaler TAKE 2 PUFFS INTO LUNGS TWICE A DAY Patient taking differently: Inhale 2 puffs into the lungs 2 (two) times daily.  05/28/19   Bedsole, Amy E, MD  dexamethasone (DECADRON) 6 MG tablet Take 1 tablet (6 mg total) by mouth daily for 7 days. 06/05/19 06/12/19  Ezekiel Slocumb, DO  diltiazem (CARDIZEM CD) 180 MG 24 hr capsule Take 1 capsule (180 mg total) by mouth daily. 03/05/19   Minna Merritts, MD  donepezil (ARICEPT) 5 MG tablet Take 5 mg by mouth daily.  05/08/19 05/07/20  [provider]  feeding supplement, ENSURE ENLIVE, (ENSURE ENLIVE) LIQD Take 237 mLs by mouth 3 (three) times daily between meals. 06/05/19   Ezekiel Slocumb, DO  guaiFENesin-dextromethorphan (ROBITUSSIN DM) 100-10 MG/5ML syrup Take 10 mLs by mouth every 4 (four) hours as needed  for cough. 06/05/19   Ezekiel Slocumb, DO  Multiple Vitamin (MULTIVITAMIN WITH MINERALS) TABS tablet Take 1 tablet by mouth daily. 06/05/19   Nicole Kindred A, DO  OXYGEN Inhale 2 L into the lungs daily.    [provider]  rosuvastatin (CRESTOR) 5 MG tablet Take 1 tablet (5 mg total) by mouth daily. 03/05/19   Minna Merritts, MD  tiotropium (SPIRIVA) 18 MCG inhalation capsule Place 1 capsule (18 mcg total) into inhaler and inhale daily. 05/28/19   Bedsole, Amy E, MD  traZODone (DESYREL) 50 MG tablet TAKE 1 & 1/2 TABLETS BY MOUTH AT [facility] NEEDED FOR SLEEP Patient taking differently: Take 75 mg by mouth at bedtime as needed for sleep.  04/13/18   Bedsole, Amy E, MD  ZOLOFT 25 MG tablet Take 25 mg by mouth daily.  04/10/19   [provider]   No Known Allergies Review of Systems  Unable to perform ROS   Physical Exam Constitutional:      Comments: Eyes closed.   Pulmonary:     Comments: On ventilator.     Vital Signs: BP (!) 94/57   Pulse (!) 55   Temp 98.8 F (37.1 C) (Axillary)   Resp 20   Ht '5\' 3"'  (1.6 m)   Wt 100.2 kg   SpO2 95%   BMI 39.13 kg/m  Pain Scale: CPOT   Pain Score: 0-No pain   SpO2: SpO2: 95 % O2 Device:SpO2: 95 % O2 Flow Rate: .O2 Flow Rate (L/min): 2 L/min  IO: Intake/output summary:   Intake/Output Summary (Last 24 hours) at 06/10/2019 1115 Last data filed at 06/10/2019 0500 Gross per 24 hour  Intake 500.52 ml  Output 670 ml  Net -169.48 ml    LBM:   Baseline Weight: Weight: 104.3 kg Most recent weight: Weight: 100.2 kg  Palliative Assessment/Data: 10%     Time In: 10:50 Time Out: 11:20 Time Total: 30 min Greater than 50%  of this time was spent counseling and coordinating care related to the above assessment and plan.  Signed by: Asencion Gowda, NP   Please contact Palliative Medicine Team phone at [phone] for questions and concerns.  For individual provider: See Shea Evans

## 2019-06-10 NOTE — Progress Notes (Signed)
Name: Susan [name] MRN: [id number] DOB: 10/07/1940    ADMISSION DATE:  06/18/2019 INITIAL CONSULTATION DATE: 05/31/2019  REFERRING MD : Dr. Windell Moment   FOLLOW UP:Ventilator dependence, acute on chronic respiratory failure with hypoxia.  BRIEF PATIENT DESCRIPTION:  79 yo female recently treated for COVID-19 pneumonia now admitted with septic shock secondary to diverticular disease with perforation s/p hartmann's procedure remained  intubated mechanically ventilated postop.  She has acute on chronic respiratory failure with hypoxia.  Underlying COPD with chronic oxygen use.  SIGNIFICANT EVENTS/STUDIES:  01/16: CT Abd Pelvis revealed free air and fluid throughout the abdomen compatible with bowel perforation. Extensive inflammatory changes about the colon beginning at the splenic flexure. Changes are most profound at the sigmoid colon, consistent with diverticulitis. This is most likely source of perforation. Diffuse colonic diverticulosis. Atherosclerosis. Stable low-density lesions in the liver. These are likely benign. Patchy bilateral airspace disease likely reflects atelectasis or infection. Multilevel degenerative changes and scoliosis of the lumbar spine.  01/16: General surgery consulted pt transported for emergent surgery 01/16: Pt admitted with diverticular disease with perforation s/p hartmann's procedure remained mechanically ventilated postop, hemodynamics tenuous 01/17:Remains on ventilator, started Dopamine 1/18: -atelectasis bilaterally, leukocytosis poss due to steroids, background of ILD per pulm.   Prior to Admission medications   Medication Sig Start Date End Date Taking? Authorizing Provider  ascorbic acid (VITAMIN C) 500 MG tablet Take 1 tablet (500 mg total) by mouth daily. 06/05/19   Nicole Kindred A, DO  budesonide-formoterol (SYMBICORT) 160-4.5 MCG/ACT inhaler TAKE 2 PUFFS INTO LUNGS TWICE A DAY Patient taking differently: Inhale 2 puffs into the lungs 2 (two)  times daily.  05/28/19   Bedsole, Amy E, MD  dexamethasone (DECADRON) 6 MG tablet Take 1 tablet (6 mg total) by mouth daily for 7 days. 06/05/19 06/12/19  Ezekiel Slocumb, DO  diltiazem (CARDIZEM CD) 180 MG 24 hr capsule Take 1 capsule (180 mg total) by mouth daily. 03/05/19   Minna Merritts, MD  donepezil (ARICEPT) 5 MG tablet Take 5 mg by mouth daily.  05/08/19 05/07/20  [provider]  feeding supplement, ENSURE ENLIVE, (ENSURE ENLIVE) LIQD Take 237 mLs by mouth 3 (three) times daily between meals. 06/05/19   Ezekiel Slocumb, DO  guaiFENesin-dextromethorphan (ROBITUSSIN DM) 100-10 MG/5ML syrup Take 10 mLs by mouth every 4 (four) hours as needed for cough. 06/05/19   Ezekiel Slocumb, DO  Multiple Vitamin (MULTIVITAMIN WITH MINERALS) TABS tablet Take 1 tablet by mouth daily. 06/05/19   Nicole Kindred A, DO  OXYGEN Inhale 2 L into the lungs daily.    [provider]  rosuvastatin (CRESTOR) 5 MG tablet Take 1 tablet (5 mg total) by mouth daily. 03/05/19   Minna Merritts, MD  tiotropium (SPIRIVA) 18 MCG inhalation capsule Place 1 capsule (18 mcg total) into inhaler and inhale daily. 05/28/19   Bedsole, Amy E, MD  traZODone (DESYREL) 50 MG tablet TAKE 1 & 1/2 TABLETS BY MOUTH AT [facility] NEEDED FOR SLEEP Patient taking differently: Take 75 mg by mouth at bedtime as needed for sleep.  04/13/18   Bedsole, Amy E, MD  ZOLOFT 25 MG tablet Take 25 mg by mouth daily.  04/10/19   [provider]   No Known Allergies   REVIEW OF SYSTEMS:   Unable to assess, pt intubated   SUBJECTIVE:  Unable to assess, pt. intubated   VITAL SIGNS: Temp:  [98.3 F (36.8 C)-98.9 F (37.2 C)] 98.8 F (37.1 C) (01/18 0856) Pulse  Rate:  [49-103] 55 (01/18 0900) Resp:  [11-20] 20 (01/18 0900) BP: (73-174)/(49-71) 94/57 (01/18 0900) SpO2:  [89 %-96 %] 95 % (01/18 0900) FiO2 (%):  [50 %-100 %] 90 % (01/18 0856) Weight:  [100.2 kg] 100.2 kg (01/18 0500)  PHYSICAL  EXAMINATION: General:chronically on  acutely ill appearing female, on ventilator, sedated, asynchronous when sedation lightened Neuro: sedated, not following commands, PERRL  HEENT: Orotracheally intubated, neck supple, trachea midline no crepitus Cardiovascular: sinus tach, no R/G , telemetry with PVCs Lungs: diminished throughout, even, non labored  Abdomen: no audible BS, soft, midline incision with wound vac intact, colostomy present  Musculoskeletal: normal bulk and tone, no edema  Skin: midline incision with wound vac intact, colostomy present    Recent Labs  Lab 06/22/2019 2017 06/09/19 0608 06/10/19 0656  NA 137 141 143  K 5.0 4.7 4.7  CL 108 112* 114*  CO2 22 24 19*  BUN 42* 49* 69*  CREATININE 1.71* 1.76* 2.02*  GLUCOSE 213* 174* 159*   Recent Labs  Lab 05/31/2019 2017 06/09/19 0608 06/10/19 0656  HGB 14.8 13.4 12.4  HCT 47.2* 42.4 40.1  WBC 16.4* 13.1* 14.2*  PLT 217 152 146*   DG Abd 1 View  Result Date: 06/17/2019 CLINICAL DATA:  Nasogastric tube placement EXAM: ABDOMEN - 1 VIEW COMPARISON:  Same-day CT FINDINGS: Interval placement of nasogastric tube with distal tip and side hole projecting over the gastric body. Nonobstructive bowel gas pattern. Left-sided nephrolithiasis. Known pneumoperitoneum is not well seen on semi upright view. IMPRESSION: Nasogastric tube with distal tip and side hole projecting over the gastric body. Electronically Signed   By: Davina Poke D.O.   On: 06/22/2019 20:24   DG Chest Port 1 View  Result Date: 06/10/2019 CLINICAL DATA:  Acute respiratory failure EXAM: PORTABLE CHEST 1 VIEW COMPARISON:  Radiograph 06/18/2019 FINDINGS: Endotracheal tube tip at the thoracic inlet. Enteric tube tip below the diaphragm not included in the field of view. Low lung volumes with patchy bibasilar airspace disease, slight worsening since prior exam. Hazy opacity at the left lung base likely represents pleural effusion. Mild cardiomegaly. No pneumothorax.  IMPRESSION: 1. Low lung volumes. Patchy bibasilar airspace disease may represent atelectasis, pneumonia or aspiration. 2. Mild cardiomegaly. Possible left pleural effusion. Electronically Signed   By: Keith Rake M.D.   On: 06/10/2019 02:50   DG Chest Port 1 View  Result Date: 05/28/2019 CLINICAL DATA:  Intubation EXAM: PORTABLE CHEST 1 VIEW COMPARISON:  05/30/2019 at 0850 hours FINDINGS: Interval placement of endotracheal tube with distal tip terminating approximately 3.8 cm superior to the carina. Stable cardiomediastinal contours. Streaky bibasilar opacities, similar to prior. Previously seen free intraperitoneal air is not clearly evident on current semiupright film. IMPRESSION: 1. Satisfactory positioning of endotracheal tube. 2. Stable bibasilar opacities, atelectasis versus infection. Electronically Signed   By: Davina Poke D.O.   On: 06/22/2019 20:22      ASSESSMENT / PLAN:  Acute hypercapnic respiratory failure secondary to recent COVID-19 pneumonia and sedating medications  Mechanical intubation  Hx: OSA, Asthma, COPD, ILD, and Morbid Obesity  Full vent support for now-vent settings reviewed with RT SBT once all parameters met  VAP bundle implemented  Scheduled and prn bronchodilator therapy  IV steroid: Switched to hydrocortisone due to shock Trend inflammatory markers  Maintain airborne and contact precautions    Hypotension secondary to septic shock and hypovolemia  Hx: Non-obstructive CAD, and Chronic diastolic CHF Continuous telemetry monitoring  Levophed for vasopressor support Hold outpatient  cardizem Check 2D echo-grade 1 diastolic CHF   Acute renal failure secondary to septic shock  Lactic acidosis  Trend BMP and lactic acid  Replace electrolytes as indicated  Monitor UOP Avoid nephrotoxic medications   Transaminitis secondary to sepsis  Trend hepatic panel   Septic shock secondary to diverticular disease with perforation s/p Hartmann's  procedure-05/25/2019 Trend WBC and monitor fever curve  Trend PCT  Follow cultures  Continue zosyn, add fluconazole -e. Fecalis + urine culture-pansensitive   Steroid induced hyperglycemia  CBG's q4hrs  SSI   Postop pain  Mechanical ventilation pain/discomfort  Hx: Stroke, Cerebral Amyloid Angiopathy, and Mixed Alzheimer's and Vascular Dementia Maintain RASS goal -1 to -2 Propofol and fentanyl gtts to maintain RASS goal and for pain management  WUA daily This issue will add complexity to her management and weaning abilities  Best Practice: VTE px: SCD's SUP px: iv protonix  Diet: keep NPO for now will defer to general surgery    Critical care provider statement:    Critical care time (minutes):  109   Critical care time was exclusive of:  Separately billable procedures and  treating other patients   Critical care was necessary to treat or prevent imminent or  life-threatening deterioration of the following conditions:  COVID19, ILD, diverticular abcess s/p harmans procedure, acute hypoxemic respiratory failure.    Critical care was time spent personally by me on the following  activities:  Development of treatment plan with patient or surrogate,  discussions with consultants, evaluation of patient's response to  treatment, examination of patient, obtaining history from patient or  surrogate, ordering and performing treatments and interventions, ordering  and review of laboratory studies and re-evaluation of patient's condition   I assumed direction of critical care for this patient from another  provider in my specialty: no       Ottie Glazier, M.D.  [facility] [place]

## 2019-06-10 NOTE — Progress Notes (Signed)
Informed NP that patient's oxygen sats are down to 89% on the 100% vent settings and NP stated she is okay with 85% or higher. Continue to monitor.

## 2019-06-10 NOTE — Progress Notes (Addendum)
Patient ID: Susan [name], female   DOB: 05/14/1941, 79 y.o.   MRN: [id number]      [facility] Day(s): 2.   Post op day(s): 2 Days Post-Op.   Interval History: Patient seen and examined.  Continue critically ill on mechanical ventilation.  She did have an episode of bradycardia with low blood pressure that required dopamine infusion.  She was able to improve her heart rate and the pressure and dopamine was discontinued.  There has been no other acute issues.  There is no change in colostomy and no bowel function at this moment.   Vital signs in last 24 hours: [min-max] current  Temp:  [98.3 F (36.8 C)-98.9 F (37.2 C)] 98.8 F (37.1 C) (01/18 0856) Pulse Rate:  [49-103] 55 (01/18 0900) Resp:  [11-20] 20 (01/18 0900) BP: (73-174)/(49-71) 94/57 (01/18 0900) SpO2:  [89 %-96 %] 95 % (01/18 0900) FiO2 (%):  [50 %-100 %] 90 % (01/18 0856) Weight:  [100.2 kg] 100.2 kg (01/18 0500)     Height: 5\' 3"  (160 cm) Weight: 100.2 kg BMI (Calculated): 39.14   Urine: 670 mL in last 24 hours  Physical Exam:  Constitutional: Sedated, no distress  Respiratory: breathing non-labored at rest on mechanical ventilation Cardiovascular: regular rate and sinus rhythm  Gastrointestinal: soft, non-tender, and non-distended.  Colostomy with viable mucosa  Labs:  CBC Latest Ref Rng & Units 06/10/2019 06/09/2019 06/21/2019  WBC 4.0 - 10.5 K/uL 14.2(H) 13.1(H) 16.4(H)  Hemoglobin 12.0 - 15.0 g/dL 12.4 13.4 14.8  Hematocrit 36.0 - 46.0 % 40.1 42.4 47.2(H)  Platelets 150 - 400 K/uL 146(L) 152 217   CMP Latest Ref Rng & Units 06/10/2019 06/09/2019 05/28/2019  Glucose 70 - 99 mg/dL 159(H) 174(H) 213(H)  BUN 8 - 23 mg/dL 69(H) 49(H) 42(H)  Creatinine 0.44 - 1.00 mg/dL 2.02(H) 1.76(H) 1.71(H)  Sodium 135 - 145 mmol/L 143 141 137  Potassium 3.5 - 5.1 mmol/L 4.7 4.7 5.0  Chloride 98 - 111 mmol/L 114(H) 112(H) 108  CO2 22 - 32 mmol/L 19(L) 24 22  Calcium 8.9 - 10.3 mg/dL 7.2(L) 7.2(L) 7.3(L)   Total Protein 6.5 - 8.1 g/dL - 5.1(L) 5.3(L)  Total Bilirubin 0.3 - 1.2 mg/dL - 2.3(H) 1.2  Alkaline Phos 38 - 126 U/L - 43 53  AST 15 - 41 U/L - 61(H) 73(H)  ALT 0 - 44 U/L - 58(H) 60(H)    Imaging studies: No new pertinent imaging studies   Assessment/Plan:  79 yo female with perforated diverticulitis s/p partial colectomy with end colostomy and intra-abdominal abscess drainage postop day #2 with current medical comorbilities including: recent COVID-19 virus infection, baseline dementia as per daughter, asthma, COPD, CAD.   Patient continue critically ill, sedated on mechanical ventilation.  Changes in clinical status with mild deterioration with decreased blood pressure and heart rate that required dopamine infusion.  Patient has been able to recover slowly now without dopamine.  During my evaluation patient with map of 67 without vasopressors.  There was also an increase in BUN and creatinine.  Patient has been increasing the urine output during the night shift and this morning.  Just right there at the bedside the patient just drained 270 mL of urine while I was there.  I hope that this is a improvement in her renal function and we shall start to see a decreasing creatinine in the near future.  Her hemoglobin has been stable.  The white blood cell continue to be stable.  There is no changes in the colostomy.  There is no bowel function at this moment.  I will expect some intestinal ileus after surgery due to the amount of infection that she had intra-abdominally.  Continue with current management with IV fluids.  I discussed the case with ICU physician to maintain a balance between being aggressive with IV fluid but not too aggressive that can affect her lungs due to Covid 19 virus infection.  We will continue with IV antibiotic therapy.  We will continue with NGT, bowel rest Foley catheter.  Patient adequate sedated and tolerating current mechanical ventilation parameters.  Appreciate ICU  physician management of current critical condition.   Daughter updated about patient current clinical status. Updates about last night episode of bradycardia and hypotension required dopamine but this morning the dopamine was on hold. Also notified about the decrease in renal function but starting to produce more urine. Still sedated on mechanical ventilation. Still on abx therapy.  Arnold Long, MD

## 2019-06-11 ENCOUNTER — Encounter: Payer: Self-pay | Admitting: *Deleted

## 2019-06-11 LAB — CBC
HCT: 36.4 % (ref 36.0–46.0)
Hemoglobin: 11.5 g/dL — ABNORMAL LOW (ref 12.0–15.0)
MCH: 29.4 pg (ref 26.0–34.0)
MCHC: 31.6 g/dL (ref 30.0–36.0)
MCV: 93.1 fL (ref 80.0–100.0)
Platelets: 124 10*3/uL — ABNORMAL LOW (ref 150–400)
RBC: 3.91 MIL/uL (ref 3.87–5.11)
RDW: 16 % — ABNORMAL HIGH (ref 11.5–15.5)
WBC: 15.5 10*3/uL — ABNORMAL HIGH (ref 4.0–10.5)
nRBC: 0 % (ref 0.0–0.2)

## 2019-06-11 LAB — GLUCOSE, CAPILLARY
Glucose-Capillary: 100 mg/dL — ABNORMAL HIGH (ref 70–99)
Glucose-Capillary: 105 mg/dL — ABNORMAL HIGH (ref 70–99)
Glucose-Capillary: 110 mg/dL — ABNORMAL HIGH (ref 70–99)
Glucose-Capillary: 123 mg/dL — ABNORMAL HIGH (ref 70–99)
Glucose-Capillary: 147 mg/dL — ABNORMAL HIGH (ref 70–99)
Glucose-Capillary: 195 mg/dL — ABNORMAL HIGH (ref 70–99)
Glucose-Capillary: 91 mg/dL (ref 70–99)

## 2019-06-11 LAB — CBC WITH DIFFERENTIAL/PLATELET
Abs Immature Granulocytes: 0.24 10*3/uL — ABNORMAL HIGH (ref 0.00–0.07)
Basophils Absolute: 0 10*3/uL (ref 0.0–0.1)
Basophils Relative: 0 %
Eosinophils Absolute: 0 10*3/uL (ref 0.0–0.5)
Eosinophils Relative: 0 %
HCT: 35.1 % — ABNORMAL LOW (ref 36.0–46.0)
Hemoglobin: 10.9 g/dL — ABNORMAL LOW (ref 12.0–15.0)
Immature Granulocytes: 2 %
Lymphocytes Relative: 4 %
Lymphs Abs: 0.5 10*3/uL — ABNORMAL LOW (ref 0.7–4.0)
MCH: 29.1 pg (ref 26.0–34.0)
MCHC: 31.1 g/dL (ref 30.0–36.0)
MCV: 93.6 fL (ref 80.0–100.0)
Monocytes Absolute: 0.8 10*3/uL (ref 0.1–1.0)
Monocytes Relative: 6 %
Neutro Abs: 11.8 10*3/uL — ABNORMAL HIGH (ref 1.7–7.7)
Neutrophils Relative %: 88 %
Platelets: 125 10*3/uL — ABNORMAL LOW (ref 150–400)
RBC: 3.75 MIL/uL — ABNORMAL LOW (ref 3.87–5.11)
RDW: 16.1 % — ABNORMAL HIGH (ref 11.5–15.5)
WBC: 13.4 10*3/uL — ABNORMAL HIGH (ref 4.0–10.5)
nRBC: 0 % (ref 0.0–0.2)

## 2019-06-11 LAB — SURGICAL PATHOLOGY

## 2019-06-11 LAB — COMPREHENSIVE METABOLIC PANEL
ALT: 50 U/L — ABNORMAL HIGH (ref 0–44)
AST: 38 U/L (ref 15–41)
Albumin: 1.9 g/dL — ABNORMAL LOW (ref 3.5–5.0)
Alkaline Phosphatase: 42 U/L (ref 38–126)
Anion gap: 10 (ref 5–15)
BUN: 73 mg/dL — ABNORMAL HIGH (ref 8–23)
CO2: 21 mmol/L — ABNORMAL LOW (ref 22–32)
Calcium: 7.4 mg/dL — ABNORMAL LOW (ref 8.9–10.3)
Chloride: 116 mmol/L — ABNORMAL HIGH (ref 98–111)
Creatinine, Ser: 1.73 mg/dL — ABNORMAL HIGH (ref 0.44–1.00)
GFR calc Af Amer: 32 mL/min — ABNORMAL LOW (ref >60)
GFR calc non Af Amer: 28 mL/min — ABNORMAL LOW (ref >60)
Glucose, Bld: 166 mg/dL — ABNORMAL HIGH (ref 70–99)
Potassium: 4.1 mmol/L (ref 3.5–5.1)
Sodium: 147 mmol/L — ABNORMAL HIGH (ref 135–145)
Total Bilirubin: 1.1 mg/dL (ref 0.3–1.2)
Total Protein: 5.4 g/dL — ABNORMAL LOW (ref 6.5–8.1)

## 2019-06-11 LAB — URINE CULTURE: Culture: >=100000 — AB

## 2019-06-11 MED ORDER — GLYCOPYRROLATE 0.2 MG/ML IJ SOLN
0.1000 mg | Freq: Three times a day (TID) | INTRAMUSCULAR | Status: DC
  Administered 2019-06-11 – 2019-06-14 (×11): 0.1 mg via INTRAVENOUS
  Filled 2019-06-11 (×11): qty 1

## 2019-06-11 NOTE — Progress Notes (Signed)
Name: Susan [name] MRN: [id number] DOB: 1940/06/02    ADMISSION DATE:  06/15/2019 INITIAL CONSULTATION DATE: 06/17/2019  REFERRING MD : Dr. Windell Moment   FOLLOW UP:Ventilator dependence, acute on chronic respiratory failure with hypoxia.  BRIEF PATIENT DESCRIPTION:  79 yo female recently treated for COVID-19 pneumonia now admitted with septic shock secondary to diverticular disease with perforation s/p hartmann's procedure remained  intubated mechanically ventilated postop.  She has acute on chronic respiratory failure with hypoxia.  Underlying COPD with chronic oxygen use.  SIGNIFICANT EVENTS/STUDIES:  01/16: CT Abd Pelvis revealed free air and fluid throughout the abdomen compatible with bowel perforation. Extensive inflammatory changes about the colon beginning at the splenic flexure. Changes are most profound at the sigmoid colon, consistent with diverticulitis. This is most likely source of perforation. Diffuse colonic diverticulosis. Atherosclerosis. Stable low-density lesions in the liver. These are likely benign. Patchy bilateral airspace disease likely reflects atelectasis or infection. Multilevel degenerative changes and scoliosis of the lumbar spine.  01/16: General surgery consulted pt transported for emergent surgery 01/16: Pt admitted with diverticular disease with perforation s/p hartmann's procedure remained mechanically ventilated postop, hemodynamics tenuous 01/17:Remains on ventilator, started Dopamine 1/18: -atelectasis bilaterally, leukocytosis poss due to steroids, background of ILD per pulm. 1/19-  Weaning trial today. Still no bowel sounds remains NPO, vitals stable with improved hemodynamics off pressors and good urine output.   Prior to Admission medications   Medication Sig Start Date End Date Taking? Authorizing Provider  ascorbic acid (VITAMIN C) 500 MG tablet Take 1 tablet (500 mg total) by mouth daily. 06/05/19   Nicole Kindred A, DO   budesonide-formoterol (SYMBICORT) 160-4.5 MCG/ACT inhaler TAKE 2 PUFFS INTO LUNGS TWICE A DAY Patient taking differently: Inhale 2 puffs into the lungs 2 (two) times daily.  05/28/19   Bedsole, Amy E, MD  dexamethasone (DECADRON) 6 MG tablet Take 1 tablet (6 mg total) by mouth daily for 7 days. 06/05/19 06/12/19  Ezekiel Slocumb, DO  diltiazem (CARDIZEM CD) 180 MG 24 hr capsule Take 1 capsule (180 mg total) by mouth daily. 03/05/19   Minna Merritts, MD  donepezil (ARICEPT) 5 MG tablet Take 5 mg by mouth daily.  05/08/19 05/07/20  [provider]  feeding supplement, ENSURE ENLIVE, (ENSURE ENLIVE) LIQD Take 237 mLs by mouth 3 (three) times daily between meals. 06/05/19   Ezekiel Slocumb, DO  guaiFENesin-dextromethorphan (ROBITUSSIN DM) 100-10 MG/5ML syrup Take 10 mLs by mouth every 4 (four) hours as needed for cough. 06/05/19   Ezekiel Slocumb, DO  Multiple Vitamin (MULTIVITAMIN WITH MINERALS) TABS tablet Take 1 tablet by mouth daily. 06/05/19   Nicole Kindred A, DO  OXYGEN Inhale 2 L into the lungs daily.    [provider]  rosuvastatin (CRESTOR) 5 MG tablet Take 1 tablet (5 mg total) by mouth daily. 03/05/19   Minna Merritts, MD  tiotropium (SPIRIVA) 18 MCG inhalation capsule Place 1 capsule (18 mcg total) into inhaler and inhale daily. 05/28/19   Bedsole, Amy E, MD  traZODone (DESYREL) 50 MG tablet TAKE 1 & 1/2 TABLETS BY MOUTH AT [facility] NEEDED FOR SLEEP Patient taking differently: Take 75 mg by mouth at bedtime as needed for sleep.  04/13/18   Bedsole, Amy E, MD  ZOLOFT 25 MG tablet Take 25 mg by mouth daily.  04/10/19   [provider]   No Known Allergies   REVIEW OF SYSTEMS:   Unable to assess, pt intubated   SUBJECTIVE:  Unable to assess,  pt. intubated   VITAL SIGNS: Temp:  [97.3 F (36.3 C)-99.4 F (37.4 C)] 97.3 F (36.3 C) (01/19 0700) Pulse Rate:  [37-85] 55 (01/19 0900) Resp:  [0-23] 23 (01/19 0900) BP: (105-149)/(51-91) 130/58 (01/19  0900) SpO2:  [93 %-98 %] 93 % (01/19 0900) FiO2 (%):  [30 %-50 %] 30 % (01/19 0820)  PHYSICAL EXAMINATION: General:chronically on  acutely ill appearing female, on ventilator, sedated, asynchronous when sedation lightened Neuro: sedated, not following commands, PERRL  HEENT: Orotracheally intubated, neck supple, trachea midline no crepitus Cardiovascular: sinus tach, no R/G , telemetry with PVCs Lungs: diminished throughout, even, non labored  Abdomen: no audible BS, soft, midline incision with wound vac intact, colostomy present  Musculoskeletal: normal bulk and tone, no edema  Skin: midline incision with wound vac intact, colostomy present    Recent Labs  Lab 06/09/19 0608 06/10/19 0656 06/11/19 0513  NA 141 143 147*  K 4.7 4.7 4.1  CL 112* 114* 116*  CO2 24 19* 21*  BUN 49* 69* 73*  CREATININE 1.76* 2.02* 1.73*  GLUCOSE 174* 159* 166*   Recent Labs  Lab 06/09/19 0608 06/10/19 0656 06/11/19 0513  HGB 13.4 12.4 10.9*  HCT 42.4 40.1 35.1*  WBC 13.1* 14.2* 13.4*  PLT 152 146* 125*   DG Chest Port 1 View  Result Date: 06/10/2019 CLINICAL DATA:  Acute respiratory failure EXAM: PORTABLE CHEST 1 VIEW COMPARISON:  Radiograph 05/24/2019 FINDINGS: Endotracheal tube tip at the thoracic inlet. Enteric tube tip below the diaphragm not included in the field of view. Low lung volumes with patchy bibasilar airspace disease, slight worsening since prior exam. Hazy opacity at the left lung base likely represents pleural effusion. Mild cardiomegaly. No pneumothorax. IMPRESSION: 1. Low lung volumes. Patchy bibasilar airspace disease may represent atelectasis, pneumonia or aspiration. 2. Mild cardiomegaly. Possible left pleural effusion. Electronically Signed   By: Keith Rake M.D.   On: 06/10/2019 02:50   ECHOCARDIOGRAM COMPLETE  Result Date: 06/10/2019   ECHOCARDIOGRAM REPORT   Patient Name:   Susan [name] Date of Exam: 06/10/2019 Medical Rec #:  [id number]        Height:       63.0  in Accession #:    [id number]       Weight:       220.9 lb Date of Birth:  02-10-1941        BSA:          2.02 m Patient Age:    5 years         BP:           117/62 mmHg Patient Gender: F                HR:           55 bpm. Exam Location:  [facility] Procedure: 2D Echo, Color Doppler and Cardiac Doppler Indications:     Shock  History:         Patient has prior history of Echocardiogram examinations, most                  recent 07/07/2017. CAD; Risk Factors:Sleep Apnea. Diastolic                  dysfunction.  Sonographer:     Sherrie Sport RDCS (AE) Referring Phys:  [id number] CARMEN Veda Canning Diagnosing Phys: Kathlyn Sacramento MD  Sonographer Comments: Technically difficult study due to poor echo windows, no subcostal window and echo performed with patient  supine and on artificial respirator. IMPRESSIONS  1. Left ventricular ejection fraction, by visual estimation, is 55 to 60%. The left ventricle has normal function. There is no left ventricular hypertrophy.  2. Left ventricular diastolic parameters are consistent with Grade I diastolic dysfunction (impaired relaxation).  3. The left ventricle has no regional wall motion abnormalities.  4. Global right ventricle has normal systolic function.The right ventricular size is normal. No increase in right ventricular wall thickness.  5. Left atrial size was mildly dilated.  6. Right atrial size was normal.  7. The mitral valve is normal in structure. No evidence of mitral valve regurgitation. No evidence of mitral stenosis.  8. The tricuspid valve is normal in structure.  9. The aortic valve is normal in structure. Aortic valve regurgitation is not visualized. No evidence of aortic valve sclerosis or stenosis. 10. The pulmonic valve was not well visualized. Pulmonic valve regurgitation is not visualized. 11. TR signal is inadequate for assessing pulmonary artery systolic pressure. FINDINGS  Left Ventricle: Left ventricular ejection fraction, by visual estimation, is 55 to 60%. The  left ventricle has normal function. The left ventricle has no regional wall motion abnormalities. There is no left ventricular hypertrophy. Left ventricular diastolic parameters are consistent with Grade I diastolic dysfunction (impaired relaxation). Normal left atrial pressure. Right Ventricle: The right ventricular size is normal. No increase in right ventricular wall thickness. Global RV systolic function is has normal systolic function. The tricuspid regurgitant velocity is 1.60 m/s, and with an assumed right atrial pressure  of 10 mmHg, the estimated right ventricular systolic pressure is TR signal is inadequate for assessing PA pressure at 20.2 mmHg. Left Atrium: Left atrial size was mildly dilated. Right Atrium: Right atrial size was normal in size Pericardium: There is no evidence of pericardial effusion. Mitral Valve: The mitral valve is normal in structure. No evidence of mitral valve regurgitation. No evidence of mitral valve stenosis by observation. Tricuspid Valve: The tricuspid valve is normal in structure. Tricuspid valve regurgitation is trivial. Aortic Valve: The aortic valve is normal in structure. Aortic valve regurgitation is not visualized. The aortic valve is structurally normal, with no evidence of sclerosis or stenosis. Aortic valve mean gradient measures 3.0 mmHg. Aortic valve peak gradient measures 6.4 mmHg. Aortic valve area, by VTI measures 1.51 cm. Pulmonic Valve: The pulmonic valve was not well visualized. Pulmonic valve regurgitation is not visualized. Pulmonic regurgitation is not visualized. Aorta: The aortic root, ascending aorta and aortic arch are all structurally normal, with no evidence of dilitation or obstruction. Venous: The inferior vena cava was not well visualized. IAS/Shunts: No atrial level shunt detected by color flow Doppler. There is no evidence of a patent foramen ovale. No ventricular septal defect is seen or detected. There is no evidence of an atrial septal  defect.  LEFT VENTRICLE PLAX 2D LVIDd:         3.54 cm LVIDs:         2.73 cm LV PW:         1.19 cm LV IVS:        1.02 cm LVOT diam:     2.00 cm LV SV:         25 ml LV SV Index:   11.34 LVOT Area:     3.14 cm  LEFT ATRIUM             Index       RIGHT ATRIUM  Index LA diam:        3.40 cm 1.69 cm/m  RA Area:     18.00 cm LA Vol (A2C):   60.4 ml 29.94 ml/m RA Volume:   44.00 ml  21.81 ml/m LA Vol (A4C):   21.8 ml 10.81 ml/m LA Biplane Vol: 36.0 ml 17.85 ml/m  AORTIC VALVE AV Area (Vmax):    1.50 cm AV Area (Vmean):   1.51 cm AV Area (VTI):     1.51 cm AV Vmax:           126.00 cm/s AV Vmean:          82.800 cm/s AV VTI:            0.240 m AV Peak Grad:      6.4 mmHg AV Mean Grad:      3.0 mmHg LVOT Vmax:         60.10 cm/s LVOT Vmean:        39.800 cm/s LVOT VTI:          0.115 m LVOT/AV VTI ratio: 0.48 MITRAL VALVE                        TRICUSPID VALVE MV Area (PHT): 2.73 cm             TR Peak grad:   10.2 mmHg MV PHT:        80.62 msec           TR Vmax:        160.00 cm/s MV Decel Time: 278 msec MV E velocity: 46.80 cm/s 103 cm/s  SHUNTS MV A velocity: 70.80 cm/s 70.3 cm/s Systemic VTI:  0.12 m MV E/A ratio:  0.66       1.5       Systemic Diam: 2.00 cm  Kathlyn Sacramento MD Electronically signed by Kathlyn Sacramento MD Signature Date/Time: 06/10/2019/11:22:50 AM    Final       ASSESSMENT / PLAN:  Acute hypercapnic respiratory failure secondary to recent COVID-19 pneumonia and sedating medications  Mechanical intubation  Hx: OSA, Asthma, COPD, ILD, and Morbid Obesity  Full vent support for now-vent settings reviewed with RT SBT once all parameters met  VAP bundle implemented  Scheduled and prn bronchodilator therapy  IV steroid: Switched to hydrocortisone due to shock Trend inflammatory markers  Maintain airborne and contact precautions    Hypotension secondary to septic shock and hypovolemia  Hx: Non-obstructive CAD, and Chronic diastolic CHF Continuous telemetry monitoring   Levophed for vasopressor support Hold outpatient cardizem Check 2D echo-grade 1 diastolic CHF   Acute renal failure secondary to septic shock  Lactic acidosis  Trend BMP and lactic acid  Replace electrolytes as indicated  Monitor UOP Avoid nephrotoxic medications   Transaminitis secondary to sepsis  Trend hepatic panel   Septic shock secondary to diverticular disease with perforation s/p Hartmann's procedure-06/15/2019 Trend WBC and monitor fever curve  Trend PCT  Follow cultures  Continue zosyn, add fluconazole -e. Fecalis + urine culture-pansensitive   Steroid induced hyperglycemia  CBG's q4hrs  SSI   Postop pain  Mechanical ventilation pain/discomfort  Hx: Stroke, Cerebral Amyloid Angiopathy, and Mixed Alzheimer's and Vascular Dementia Maintain RASS goal -1 to -2 Propofol and fentanyl gtts to maintain RASS goal and for pain management  WUA daily This issue will add complexity to her management and weaning abilities  Best Practice: VTE px: SCD's SUP px: iv protonix  Diet: keep NPO for now  will defer to general surgery    Critical care provider statement:    Critical care time (minutes):  33   Critical care time was exclusive of:  Separately billable procedures and  treating other patients   Critical care was necessary to treat or prevent imminent or  life-threatening deterioration of the following conditions:  COVID19, ILD, diverticular abcess s/p harmans procedure, acute hypoxemic respiratory failure.    Critical care was time spent personally by me on the following  activities:  Development of treatment plan with patient or surrogate,  discussions with consultants, evaluation of patient's response to  treatment, examination of patient, obtaining history from patient or  surrogate, ordering and performing treatments and interventions, ordering  and review of laboratory studies and re-evaluation of patient's condition   I assumed direction of critical care for  this patient from another  provider in my specialty: no       Ottie Glazier, M.D.  Pulmonary & [place]

## 2019-06-11 NOTE — Progress Notes (Signed)
Initial Nutrition Assessment  DOCUMENTATION CODES:   Obesity unspecified  INTERVENTION:  Will monitor for nutrition plan per surgery.  Once appropriate for enteral nutrition recommend initiating trickle tube feeds of Vital 1.5 Cal at 20 mL/hr.  If patient tolerates trickle rate can begin advancing by 20 mL/hr every 8 hours to goal regiment of Vital 1.5 Cal at 60 mL/hr + Pro-Stat 30 mL daily per tube. Provides 2260 kcal, 112 grams of protein, 1382 mL H2O daily.  Once tube feeds are initiated provide a minimum free water flush of 30 mL Q4hrs to maintain tube patency.  NUTRITION DIAGNOSIS:   Increased nutrient needs related to post-op healing, catabolic [id number] as evidenced by estimated needs.  GOAL:   Provide needs based on ASPEN/SCCM guidelines  MONITOR:   Vent status, Labs, Weight trends, TF tolerance, Skin, I & O's  REASON FOR ASSESSMENT:   Ventilator    ASSESSMENT:   79 year old female with PMHx of diverticulosis, PUD with perforation in the past, dementia, arthritis, asthma, CAD, OSA, recent admission 1/9-1/13 for COVID-19 infection who is now admitted with diverticular disease with perforation and intra abdominal abscess s/p Hartmann's procedure on 05/30/2019 and has remained intubated post-operatively.   Discussed with RN and on rounds this AM. Patient failed SBT this AM and is back on PRVC. Patient is on airborne/contact precautions in setting of COVID-19 so RD unable to complete NFPE. Will use weight of 96.1 kg from 1/16 to estimated needs.  IV Access: midline single lumen placed 1/17  Enteral Access: NGT placed 1/16; terminates in gastric body; currently to low intermittent suction  MAP: 73-113 mmHg and pt not requiring any pressors  Patient is currently intubated on ventilator support Ve: 9 L/min Temp (24hrs), Avg:97.9 F (36.6 C), Min:97.1 F (36.2 C), Max:98.5 F (36.9 C)  Propofol: N/A  Medications reviewed and include: glycopyrrolate 0.1 mg  TID, Novolog 0-9 units Q4hrs, famotidine, fentanyl gtt, Diflucan, Zosyn.  Labs reviewed: CBG 91-147, Sodium 147, Chloride 116, CO2 21, BUN 73, Creatinine 1.73.  I/O: 535 mL UOP yesterday; 0 mL output from NGT; 10 mL output from JP drain yesterday  NUTRITION - FOCUSED PHYSICAL EXAM:  Unable to complete at this time.  Diet Order:   Diet Order            Diet NPO time specified  Diet effective now             EDUCATION NEEDS:   No education needs have been identified at this time  Skin:  Skin Assessment: Skin Integrity Issues: Skin Integrity Issues:: Stage II, Incisions Stage II: sacrum Incisions: closed incision to abdomen with wound VAC  Last BM:  Unknown/PTA  Height:   Ht Readings from Last 1 Encounters:  06/11/19 5' 2.99" (1.6 m)   Weight:   Wt Readings from Last 1 Encounters:  06/10/19 100.2 kg   Ideal Body Weight:  52.3 kg  BMI:  Body mass index is 39.14 kg/m.  Estimated Nutritional Needs:   Kcal:  2100-2400  Protein:  105-120 grams  Fluid:  >1.6 L/day  Jacklynn Barnacle, MS, RD, LDN Office: [phone] Pager: [phone] After Hours/Weekend Pager: [phone]

## 2019-06-11 NOTE — Progress Notes (Signed)
Recruitment maneuver performed. Tolerated well. SpO2 maintaining at 97-98%. Returned to previous settings.

## 2019-06-11 NOTE — Progress Notes (Addendum)
Patient ID: Susan [name], female   DOB: October 23, 1940, 79 y.o.   MRN: [id number]     [place] [facility] Day(s): 3.   Post op day(s): 3 Days Post-Op.   Interval History: Patient seen and examined, no acute events or new complaints overnight.  Patient continue critically ill, sedated, on mechanical ventilation.  No history was able to be taking due to the sedation.  As per nurse patient without any deterioration.  Patient been able to keep blood pressures stable.  Improving urine output.  No issue with wound.   Vital signs in last 24 hours: [min-max] current  Temp:  [97.1 F (36.2 C)-98.5 F (36.9 C)] 97.1 F (36.2 C) (01/19 1300) Pulse Rate:  [37-85] 46 (01/19 1500) Resp:  [0-23] 20 (01/19 1500) BP: (108-149)/(51-100) 144/100 (01/19 1500) SpO2:  [92 %-98 %] 94 % (01/19 1500) FiO2 (%):  [30 %] 30 % (01/19 1100)     Height: 5' 2.99" (160 cm) Weight: 100.2 kg BMI (Calculated): 39.14     Physical Exam:  Constitutional: Sedated, no distress  Respiratory: breathing non-labored at rest on ventilation Cardiovascular: Bradycardia and sinus rhythm  Gastrointestinal: soft, non-tender, and non-distended.  Colostomy patent with viable mucosa.  Labs:  CBC Latest Ref Rng & Units 06/11/2019 06/11/2019 06/10/2019  WBC 4.0 - 10.5 K/uL 15.5(H) 13.4(H) 14.2(H)  Hemoglobin 12.0 - 15.0 g/dL 11.5(L) 10.9(L) 12.4  Hematocrit 36.0 - 46.0 % 36.4 35.1(L) 40.1  Platelets 150 - 400 K/uL 124(L) 125(L) 146(L)   CMP Latest Ref Rng & Units 06/11/2019 06/10/2019 06/09/2019  Glucose 70 - 99 mg/dL 166(H) 159(H) 174(H)  BUN 8 - 23 mg/dL 73(H) 69(H) 49(H)  Creatinine 0.44 - 1.00 mg/dL 1.73(H) 2.02(H) 1.76(H)  Sodium 135 - 145 mmol/L 147(H) 143 141  Potassium 3.5 - 5.1 mmol/L 4.1 4.7 4.7  Chloride 98 - 111 mmol/L 116(H) 114(H) 112(H)  CO2 22 - 32 mmol/L 21(L) 19(L) 24  Calcium 8.9 - 10.3 mg/dL 7.4(L) 7.2(L) 7.2(L)  Total Protein 6.5 - 8.1 g/dL 5.4(L) - 5.1(L)  Total Bilirubin 0.3 - 1.2 mg/dL 1.1 -  2.3(H)  Alkaline Phos 38 - 126 U/L 42 - 43  AST 15 - 41 U/L 38 - 61(H)  ALT 0 - 44 U/L 50(H) - 58(H)    Imaging studies: No new pertinent imaging studies   Assessment/Plan:  79 yo female with perforated diverticulitiss/p partial colectomy with end colostomy and intra-abdominal abscess drainage postop day #3 with current medical comorbilities including: recent COVID-19 virus infection, baseline dementia as per daughter, asthma, COPD, CAD.  Patient continue without clinical deterioration.  Patient is able to maintain her blood pressure without pressors.  Slowly improving urine output.  Slowly decreasing creatinine and BUN.  There is still no bowel function.  I agree and appreciate hospitalist management of patient critical care.  No contraindication for anticoagulation for DVT prophylaxis.  We will continue with NGT in place until bowel function returns.  We will continue with [facility] for wound care.  Arnold Long, MD

## 2019-06-11 NOTE — Progress Notes (Signed)
Pt placed on spontaneous today and fentanyl remains off. Pt unable to follow simple commands. Non-purposeful movement when stimulated. Colostomy, Midline, JP intact. Urine output adequate. Continue to monitor.

## 2019-06-12 ENCOUNTER — Ambulatory Visit: Payer: Medicare HMO | Admitting: Physician Assistant

## 2019-06-12 ENCOUNTER — Inpatient Hospital Stay: Payer: Medicare HMO

## 2019-06-12 ENCOUNTER — Inpatient Hospital Stay: Payer: Self-pay

## 2019-06-12 DIAGNOSIS — Z515 Encounter for palliative care: Secondary | ICD-10-CM

## 2019-06-12 DIAGNOSIS — L8990 Pressure ulcer of unspecified site, unspecified stage: Secondary | ICD-10-CM | POA: Insufficient documentation

## 2019-06-12 DIAGNOSIS — Z7189 Other specified counseling: Secondary | ICD-10-CM

## 2019-06-12 LAB — GLUCOSE, CAPILLARY
Glucose-Capillary: 105 mg/dL — ABNORMAL HIGH (ref 70–99)
Glucose-Capillary: 109 mg/dL — ABNORMAL HIGH (ref 70–99)
Glucose-Capillary: 114 mg/dL — ABNORMAL HIGH (ref 70–99)
Glucose-Capillary: 70 mg/dL (ref 70–99)
Glucose-Capillary: 87 mg/dL (ref 70–99)
Glucose-Capillary: 85 mg/dL (ref 70–99)
Glucose-Capillary: 178 mg/dL — ABNORMAL HIGH (ref 70–99)

## 2019-06-12 MED ORDER — ALBUMIN HUMAN 25 % IV SOLN
12.5000 g | Freq: Once | INTRAVENOUS | Status: AC
  Administered 2019-06-12: 12.5 g via INTRAVENOUS
  Filled 2019-06-12: qty 50

## 2019-06-12 MED ORDER — ATORVASTATIN CALCIUM 20 MG PO TABS
40.0000 mg | ORAL_TABLET | Freq: Every day | ORAL | Status: DC
  Administered 2019-06-13: 40 mg via ORAL
  Filled 2019-06-12: qty 2

## 2019-06-12 MED ORDER — TRACE MINERALS CU-MN-SE-ZN 300-55-60-3000 MCG/ML IV SOLN
INTRAVENOUS | Status: AC
  Filled 2019-06-12: qty 960

## 2019-06-12 MED ORDER — ASPIRIN EC 81 MG PO TBEC
81.0000 mg | DELAYED_RELEASE_TABLET | Freq: Every day | ORAL | Status: DC
  Administered 2019-06-12 – 2019-06-13 (×2): 81 mg via ORAL
  Filled 2019-06-12 (×2): qty 1

## 2019-06-12 MED ORDER — DEXMEDETOMIDINE HCL IN NACL 400 MCG/100ML IV SOLN: 0.2000 ug/kg/h | INTRAVENOUS | Status: DC

## 2019-06-12 MED ORDER — DEXTROSE 50 % IV SOLN
INTRAVENOUS | Status: AC
  Filled 2019-06-12: qty 50

## 2019-06-12 MED ORDER — SODIUM CHLORIDE 0.9% FLUSH: 10.0000 mL | INTRAVENOUS | Status: DC | PRN

## 2019-06-12 MED ORDER — SODIUM CHLORIDE 0.9% FLUSH
10.0000 mL | Freq: Two times a day (BID) | INTRAVENOUS | Status: DC
  Administered 2019-06-12 – 2019-06-13 (×3): 10 mL

## 2019-06-12 NOTE — Progress Notes (Signed)
Patient has begun SBT with 10 Pressure support.  Saturating at 93%.  Patient still does not wake to stimulation but does respond to pain stimulus.

## 2019-06-12 NOTE — Progress Notes (Signed)
Palliative: Susan [name] is resting quietly in bed.  She is in isolation room, but is viewed to the glass door to her room in the ICU.  Conference with attending, bedside nursing, medical team related to patient condition, needs, treatment plan.  Call to daughter, [name] cross at [phone].  We talk in detail about her acute health issues and the treatment plan.  We also talk about some "what if's and maybe's".  We talk about 10-14 days for oral intubation and tracheostomy. I reassure [name] that family is not being called to make choices at this time, but to consider what Mrs. Ohora would and would not want.   All questions answered. PMT to continue to follow.    Plan:  Continue FULL scope/code.  Time for outcomes.   63 minutes Quinn Axe, NP Palliative Medicine Team Team Phone # [phone] Greater than 50% of this time was spent counseling and coordinating care related to the above assessment and plan.

## 2019-06-12 NOTE — Progress Notes (Signed)
Pt was transported to CT from CCU and back to CCU while on the vent.

## 2019-06-12 NOTE — Progress Notes (Signed)
Name: Susan [name] MRN: [id number] DOB: May 26, 1940    ADMISSION DATE:  06/02/2019 INITIAL CONSULTATION DATE: 05/27/2019  REFERRING MD : Dr. Windell Moment   FOLLOW UP:Ventilator dependence, acute on chronic respiratory failure with hypoxia.  BRIEF PATIENT DESCRIPTION:  79 yo female recently treated for COVID-19 pneumonia now admitted with septic shock secondary to diverticular disease with perforation s/p hartmann's procedure remained  intubated mechanically ventilated postop.  She has acute on chronic respiratory failure with hypoxia.  Underlying COPD with chronic oxygen use.  SIGNIFICANT EVENTS/STUDIES:  01/16: CT Abd Pelvis revealed free air and fluid throughout the abdomen compatible with bowel perforation. Extensive inflammatory changes about the colon beginning at the splenic flexure. Changes are most profound at the sigmoid colon, consistent with diverticulitis. This is most likely source of perforation. Diffuse colonic diverticulosis. Atherosclerosis. Stable low-density lesions in the liver. These are likely benign. Patchy bilateral airspace disease likely reflects atelectasis or infection. Multilevel degenerative changes and scoliosis of the lumbar spine.  01/16: General surgery consulted pt transported for emergent surgery 01/16: Pt admitted with diverticular disease with perforation s/p hartmann's procedure remained mechanically ventilated postop, hemodynamics tenuous 01/17:Remains on ventilator, started Dopamine 1/18: -atelectasis bilaterally, leukocytosis poss due to steroids, background of ILD per pulm. 1/19-  Weaning trial today. Still no bowel sounds remains NPO, vitals stable with improved hemodynamics off pressors and good urine output.  1/20 - repeat weaning trial today, secretions improved, will discuss with surgery Relistor to move GI tract & nourishment plan & Diflucan need. Plan for extubation if possible today. If mentation not improved will image brain for CVA.    Prior to Admission medications   Medication Sig Start Date End Date Taking? Authorizing Provider  ascorbic acid (VITAMIN C) 500 MG tablet Take 1 tablet (500 mg total) by mouth daily. 06/05/19   Nicole Kindred A, DO  budesonide-formoterol (SYMBICORT) 160-4.5 MCG/ACT inhaler TAKE 2 PUFFS INTO LUNGS TWICE A DAY Patient taking differently: Inhale 2 puffs into the lungs 2 (two) times daily.  05/28/19   Bedsole, Amy E, MD  dexamethasone (DECADRON) 6 MG tablet Take 1 tablet (6 mg total) by mouth daily for 7 days. 06/05/19 06/12/19  Ezekiel Slocumb, DO  diltiazem (CARDIZEM CD) 180 MG 24 hr capsule Take 1 capsule (180 mg total) by mouth daily. 03/05/19   Minna Merritts, MD  donepezil (ARICEPT) 5 MG tablet Take 5 mg by mouth daily.  05/08/19 05/07/20  [provider]  feeding supplement, ENSURE ENLIVE, (ENSURE ENLIVE) LIQD Take 237 mLs by mouth 3 (three) times daily between meals. 06/05/19   Ezekiel Slocumb, DO  guaiFENesin-dextromethorphan (ROBITUSSIN DM) 100-10 MG/5ML syrup Take 10 mLs by mouth every 4 (four) hours as needed for cough. 06/05/19   Ezekiel Slocumb, DO  Multiple Vitamin (MULTIVITAMIN WITH MINERALS) TABS tablet Take 1 tablet by mouth daily. 06/05/19   Nicole Kindred A, DO  OXYGEN Inhale 2 L into the lungs daily.    [provider]  rosuvastatin (CRESTOR) 5 MG tablet Take 1 tablet (5 mg total) by mouth daily. 03/05/19   Minna Merritts, MD  tiotropium (SPIRIVA) 18 MCG inhalation capsule Place 1 capsule (18 mcg total) into inhaler and inhale daily. 05/28/19   Bedsole, Amy E, MD  traZODone (DESYREL) 50 MG tablet TAKE 1 & 1/2 TABLETS BY MOUTH AT [facility] NEEDED FOR SLEEP Patient taking differently: Take 75 mg by mouth at bedtime as needed for sleep.  04/13/18   [name], MD  ZOLOFT 25  MG tablet Take 25 mg by mouth daily.  04/10/19   [provider]   No Known Allergies   REVIEW OF SYSTEMS:   Unable to assess, pt intubated   SUBJECTIVE:  Unable to  assess, pt. intubated   VITAL SIGNS: Temp:  [97.1 F (36.2 C)-98.1 F (36.7 C)] 98.1 F (36.7 C) (01/20 0800) Pulse Rate:  [41-108] 66 (01/20 1000) Resp:  [18-23] 21 (01/20 1000) BP: (116-172)/(56-128) 147/67 (01/20 1000) SpO2:  [91 %-96 %] 93 % (01/20 1000) FiO2 (%):  [30 %] 30 % (01/20 0800)  PHYSICAL EXAMINATION: General:chronically on  acutely ill appearing female, on ventilator, sedated, asynchronous when sedation lightened Neuro: sedated, not following commands, PERRL  HEENT: Orotracheally intubated, neck supple, trachea midline no crepitus Cardiovascular: sinus tach, no R/G , telemetry with PVCs Lungs: diminished throughout, even, non labored  Abdomen: no audible BS, soft, midline incision with wound vac intact, colostomy present  Musculoskeletal: normal bulk and tone, no edema  Skin: midline incision with wound vac intact, colostomy present    Recent Labs  Lab 06/09/19 0608 06/10/19 0656 06/11/19 0513  NA 141 143 147*  K 4.7 4.7 4.1  CL 112* 114* 116*  CO2 24 19* 21*  BUN 49* 69* 73*  CREATININE 1.76* 2.02* 1.73*  GLUCOSE 174* 159* 166*   Recent Labs  Lab 06/10/19 0656 06/11/19 0513 06/11/19 1355  HGB 12.4 10.9* 11.5*  HCT 40.1 35.1* 36.4  WBC 14.2* 13.4* 15.5*  PLT 146* 125* 124*   No results found.    ASSESSMENT / PLAN:  Acute hypercapnic respiratory failure secondary to recent COVID-19 pneumonia and sedating medications  Mechanical intubation  Hx: OSA, Asthma, COPD, ILD, and Morbid Obesity  Full vent support for now-vent settings reviewed with RT SBT once all parameters met  VAP bundle implemented  Scheduled and prn bronchodilator therapy  IV steroid: stopped  Maintain airborne and contact precautions    Hypotension secondary to septic shock and hypovolemia -resolved Hx: Non-obstructive CAD, and Chronic diastolic CHF Continuous telemetry monitoring  Levophed for vasopressor support-no longer indicated as BP is wnl Hold outpatient  cardizem Check 2D echo-grade 1 diastolic CHF   Acute renal failure secondary to septic shock  Lactic acidosis  Trend BMP and lactic acid  Replace electrolytes as indicated  Monitor UOP Avoid nephrotoxic medications     Transaminitis secondary to sepsis  Trend hepatic panel    Septic shock secondary to diverticular disease with perforation s/p Hartmann's procedure-06/21/2019 Trend WBC and monitor fever curve  Trend PCT  Follow cultures  Continue zosyn, add fluconazole -e. Fecalis + urine culture-pansensitive   Steroid induced hyperglycemia  CBG's q4hrs  SSI   Postop pain  Mechanical ventilation pain/discomfort  Hx: Stroke, Cerebral Amyloid Angiopathy, and Mixed Alzheimer's and Vascular Dementia Maintain RASS goal -1 to -2 Propofol and fentanyl gtts to maintain RASS goal and for pain management  WUA daily This issue will add complexity to her management and weaning abilities  Best Practice: VTE px: SCD's SUP px: iv protonix  Diet: keep NPO for now will defer to general surgery    Critical care provider statement:    Critical care time (minutes):  33   Critical care time was exclusive of:  Separately billable procedures and  treating other patients   Critical care was necessary to treat or prevent imminent or  life-threatening deterioration of the following conditions:  COVID19, ILD, diverticular abcess s/p harmans procedure, acute hypoxemic respiratory failure.    Critical care was  time spent personally by me on the following  activities:  Development of treatment plan with patient or surrogate,  discussions with consultants, evaluation of patient's response to  treatment, examination of patient, obtaining history from patient or  surrogate, ordering and performing treatments and interventions, ordering  and review of laboratory studies and re-evaluation of patient's condition   I assumed direction of critical care for this patient from another  provider in my  specialty: no       Ottie Glazier, M.D.  [facility]

## 2019-06-12 NOTE — Progress Notes (Signed)
Nutrition Follow-up  RD working remotely.  DOCUMENTATION CODES:   Obesity unspecified  INTERVENTION:  Once PICC placed and confirmed initiate Clinimix E 5/20 at 40 mL/hr x 24 hrs.   After 24 hours if electrolytes and CBGs are within acceptable range advance to Clinimix E 5/20 at 83 mL/hr x 24 hrs. This provides 1753 kcal, 100 grams of protein, and 1992 mL fluid daily.  Withhold lipids for first 7 days of critical illness per ASPEN/SCCM guidelines. On 06/16/2019 initiate 20% ILE (lipids) 250 mL over 12 hours. Total regimen including lipids provides 2253 kcal daily.  Provide adult MVI in TPN per protocol in setting of shortage and provide trace elements daily in TPN. Provide thiamine 100 mg daily x 3 days in TPN due to risk for refeeding syndrome.  Monitor magnesium, potassium, and phosphorus daily for at least 3 days, MD to replete as needed, as pt is at risk for refeeding syndrome.  Once appropriate for initiation of enteral nutrition per surgery recommend starting Vital 1.5 Cal at 20 mL/hr.  NUTRITION DIAGNOSIS:   Increased nutrient needs related to post-op healing, catabolic [id number]) as evidenced by estimated needs.  Ongoing.  GOAL:   Provide needs based on ASPEN/SCCM guidelines  Progressing with initiation of TPN.  MONITOR:   Vent status, Labs, Weight trends, TF tolerance, Skin, I & O's  REASON FOR ASSESSMENT:   Ventilator, Consult New TPN/TNA  ASSESSMENT:   79 year old female with PMHx of diverticulosis, PUD with perforation in the past, dementia, arthritis, asthma, CAD, OSA, recent admission 1/9-1/13 for COVID-19 infection who is now admitted with diverticular disease with perforation and intra abdominal abscess s/p Hartmann's procedure on 06/02/2019 and has remained intubated post-operatively.  Patient is currently intubated on ventilator support MV: 9.3 L/min Temp (24hrs), Avg:97.9 F (36.6 C), Min:97.1 F (36.2 C), Max:98.6 F (37 C)  Propofol:  N/A  Medications reviewed and include: glycopyrrolate, Novolog 0-9 units Q4hrs, fentanyl gtt, Zosyn.  Labs reviewed: CBG 70-114.  Enteral Access: NGT to LIS  IV Access: order for PICC placement today  Discussed with RN and on rounds. Plan is to start TPN today.  Diet Order:   Diet Order            Diet NPO time specified  Diet effective now             EDUCATION NEEDS:   No education needs have been identified at this time  Skin:  Skin Assessment: Skin Integrity Issues: Skin Integrity Issues:: Stage II, Incisions Stage II: sacrum Incisions: closed incision to abdomen with wound VAC  Last BM:  Unknown  Height:   Ht Readings from Last 1 Encounters:  06/12/19 5' 2.99" (1.6 m)   Weight:   Wt Readings from Last 1 Encounters:  06/10/19 100.2 kg   Ideal Body Weight:  52.3 kg  BMI:  Body mass index is 39.14 kg/m.  Estimated Nutritional Needs:   Kcal:  2100-2400  Protein:  105-120 grams  Fluid:  >1.6 L/day  Jacklynn Barnacle, MS, RD, LDN Office: [phone] Pager: [phone] After Hours/Weekend Pager: [phone]

## 2019-06-12 NOTE — Progress Notes (Signed)
Moved patient with vent to CT.  Patient has sustained to infarcts upon investigation.  Intensivist has been notified.  Patient has begun to produce brown liquid into her colostomy bag.  She was delivered 2 bolus of 62mcg of fentanyl.  Her heart rate escalated around 1700 to 130's.  Bolused second dose of fentanyl suspecting pain and patients HR subsided to low 100's.  Family called earlier this afternoon to inquire about patient condition.  Explained course of therapy and purpose of post surgical modalities.  Vital signs SBP in 160's.  RR mid 20's.  HR low 100's.  She received one dose of albumin (human) and a dose of 3.375 Zosyn.  PICC line was inserted to the left upper arm.  Xray confirmed placement in the SVC.

## 2019-06-12 NOTE — Progress Notes (Signed)
Patient ID: Susan [name], female   DOB: 10-05-1940, 79 y.o.   MRN: [id number]     [facility] Day(s): 4.   Post op day(s): 4 Days Post-Op.   Interval History: Patient seen and examined, no acute events or new complaints overnight.  Patient continue critically ill on clinical ventilation.  Sedation was stopped during the morning.  Patient still not following any commands or opening her eyes.  Patiently mostly moving her left extremity without any purpose.  There has been some watery stool in the colostomy bag without significant air.  Bowel sounds very slow.  Improve urine output.  Vital signs in last 24 hours: [min-max] current  Temp:  [97.7 F (36.5 C)-98.6 F (37 C)] 97.9 F (36.6 C) (01/20 1600) Pulse Rate:  [41-121] 121 (01/20 1619) Resp:  [18-26] 26 (01/20 1619) BP: (132-172)/(58-128) 153/92 (01/20 1600) SpO2:  [91 %-96 %] 92 % (01/20 1619) FiO2 (%):  [30 %] 30 % (01/20 1215)     Height: 5' 2.99" (160 cm) Weight: 100.2 kg BMI (Calculated): 39.14   Urine: 1600 mL in last 24 hours  Physical Exam:  Constitutional: Critically ill Respiratory: breathing non-labored at rest and mechanical ventilation Cardiovascular: Mild tachycardia Gastrointestinal: soft, Praveena in place.  Left colostomy with viable mucosa.  The borders of the colostomy with acute changes but the mucosa in the middle is viable.  There is watery stool in the bag.  Labs:  CBC Latest Ref Rng & Units 06/11/2019 06/11/2019 06/10/2019  WBC 4.0 - 10.5 K/uL 15.5(H) 13.4(H) 14.2(H)  Hemoglobin 12.0 - 15.0 g/dL 11.5(L) 10.9(L) 12.4  Hematocrit 36.0 - 46.0 % 36.4 35.1(L) 40.1  Platelets 150 - 400 K/uL 124(L) 125(L) 146(L)   CMP Latest Ref Rng & Units 06/11/2019 06/10/2019 06/09/2019  Glucose 70 - 99 mg/dL 166(H) 159(H) 174(H)  BUN 8 - 23 mg/dL 73(H) 69(H) 49(H)  Creatinine 0.44 - 1.00 mg/dL 1.73(H) 2.02(H) 1.76(H)  Sodium 135 - 145 mmol/L 147(H) 143 141  Potassium 3.5 - 5.1 mmol/L 4.1 4.7 4.7   Chloride 98 - 111 mmol/L 116(H) 114(H) 112(H)  CO2 22 - 32 mmol/L 21(L) 19(L) 24  Calcium 8.9 - 10.3 mg/dL 7.4(L) 7.2(L) 7.2(L)  Total Protein 6.5 - 8.1 g/dL 5.4(L) - 5.1(L)  Total Bilirubin 0.3 - 1.2 mg/dL 1.1 - 2.3(H)  Alkaline Phos 38 - 126 U/L 42 - 43  AST 15 - 41 U/L 38 - 61(H)  ALT 0 - 44 U/L 50(H) - 58(H)    Imaging studies: No new pertinent imaging studies   Assessment/Plan:  79 yo female with perforated diverticulitiss/p partial colectomy with end colostomy and intra-abdominal abscess drainage postop day #4with current medical comorbilities including: recent COVID-19 virus infection, baseline dementia as per daughter, asthma, COPD, CAD.  Patient continue critically ill on mechanical ventilation.  Sedation was stopped this morning with patient not following commands.  Also with secretions.  Will not try extubation today.  Slowly recovering bowel function with watery stool but no significant gas.  Bowel sounds are very slow stable.  I agree with starting TPN.  Patient tolerating current mechanical ventilation parameters.  The urine output has been improving.  The renal function has been improving.  No sign of bleeding.  I agree and appreciate ICU physician for the management of the critical care.  I will continue to follow along to assist in the management of the colostomy and surgical assistance.  Arnold Long, MD

## 2019-06-12 NOTE — Progress Notes (Signed)
Patient is not alert upon assessment.  She responds to pain but does not open her eyes to voice or command.  She is currently receiving 54mcg/hr Fentanyl via IV infusion.  Her breath sounds are course in the upper lobes but the lower lobes or diminished.  Vent settings 30% FiO2 PEEP 5 Rate 20 with 450 tidal volume. Saturating at 95% with a good PLETH.  Heart sounds are strong.  Peripheral pulses strong.  NSR to sinus arrhythmia on monitor.  Performed mouth swab cleaning with CHG and suctioned.  Patient mucosa and gums look moist and pink. Midline incision to suction of wound vac at 127mmHg.  J/P drain to lower right quadrant output is serous colored. Colostomy stoma looks red and healthy.

## 2019-06-12 NOTE — Progress Notes (Signed)
PHARMACY - TOTAL PARENTERAL NUTRITION CONSULT NOTE    Patient Measurements: Height: 5' 2.99" (160 cm) Weight: 220 lb 14.4 oz (100.2 kg) IBW/kg (Calculated) : 52.38 TPN AdjBW (KG): 63.3 Body mass index is 39.14 kg/m.  Assessment: 79 year old female with perforated diverticulitis s/p partial colectomy with end colostomy and intra-abdominal abscess drainage 1/16. Patient remains intubated, non-responsive when off sedation. Pharmacy consulted to start TPN.  Glucose / Insulin: glucose range 70-166, SSI for now Electrolytes: WNL Renal: SCr 1.73 LFTs / TGs: WNL Prealbumin / albumin: albumin 1.9 Surgeries / Procedures: Hartmann's procedure 1/16  Central access: PICC pending TPN start date: 1/20 (assuming PICC placement)  Nutritional Goals (per RD recommendation on 1/20): kCal: 2100-2400, Protein: 105-120 g, Fluid: > 1.6 L/day Goal TPN rate is 83 mL/hr (provides 100 g of protein and 1753 kcals per day)  Current Nutrition: NPO  Plan:  Start TPN at 40 mL/hr at 1800 Hold lipid emulsion for first 7 days for critically ill patients per ASPEN guidelines (Start date 1/24) Electrolytes in TPN: 11mEq/L of Na, 40mEq/L of K, 22mEq/L of Ca, 9mEq/L of Mg, and 71mmol/L of Phos. Cl:Ac ratio 1:1 Add trace elements to TPN daily, MVI every Mon/Thurs in setting of shortage Add thiamine 100 mg daily x 3 days Monitor TPN labs on Mon/Thurs. Will follow magnesium, potassium and phosphorus daily for at least 3 days as patient is at risk for refeeding syndrome.  Tawnya Crook, PharmD 06/12/2019,2:05 PM

## 2019-06-12 NOTE — Progress Notes (Signed)
Peripherally Inserted Central Catheter/Midline Placement  The IV Nurse has discussed with the patient and/or persons authorized to consent for the patient, the purpose of this procedure and the potential benefits and risks involved with this procedure.  The benefits include less needle sticks, lab draws from the catheter, and the patient may be discharged home with the catheter. Risks include, but not limited to, infection, bleeding, blood clot (thrombus formation), and puncture of an artery; nerve damage and irregular heartbeat and possibility to perform a PICC exchange if needed/ordered by physician.  Alternatives to this procedure were also discussed.  Bard Power PICC patient education guide, fact sheet on infection prevention and patient information card has been provided to patient /or left at bedside.  Consent obtained via telephone from daughter, Reubin Milan, due to altered mental status.  PICC/Midline Placement Documentation  PICC Triple Lumen 06/12/19 PICC Left Brachial 42 cm 0 cm (Active)  Indication for Insertion or Continuance of Line Administration of hyperosmolar/irritating solutions (i.e. TPN, Vancomycin, etc.) 06/12/19 1415  Exposed Catheter (cm) 0 cm 06/12/19 1415  Site Assessment Clean;Dry;Intact 06/12/19 1415  Lumen #1 Status Flushed;Saline locked;Blood return noted 06/12/19 1415  Lumen #2 Status Flushed;Saline locked;Blood return noted 06/12/19 1415  Lumen #3 Status Flushed;Saline locked;Blood return noted 06/12/19 1415  Dressing Type Transparent 06/12/19 1415  Dressing Status Clean;Dry;Intact;Antimicrobial disc in place 06/12/19 1415  Dressing Change Due 06/19/19 06/12/19 1415       Ramie Palladino, Nicolette Bang 06/12/2019, 2:16 PM

## 2019-06-12 NOTE — Progress Notes (Signed)
Stopped Fentanyl.  Coordinated with Tx Team to wake patient.  Contacted surgical team and inquired about TPN therapy.  Surgical team agreed TPN was appropriate.  Spoke with Lanney Gins MD to order PICC line for further medical therapies.  Bathed patient and changed linens and gown. Patient did not wake but still moves her left arm independently with no other stimulus observed.

## 2019-06-13 ENCOUNTER — Ambulatory Visit: Payer: Medicare HMO | Admitting: Family Medicine

## 2019-06-13 DIAGNOSIS — I639 Cerebral infarction, unspecified: Secondary | ICD-10-CM

## 2019-06-13 LAB — CBC
HCT: 31.7 % — ABNORMAL LOW (ref 36.0–46.0)
Hemoglobin: 9.9 g/dL — ABNORMAL LOW (ref 12.0–15.0)
MCH: 29.2 pg (ref 26.0–34.0)
MCHC: 31.2 g/dL (ref 30.0–36.0)
MCV: 93.5 fL (ref 80.0–100.0)
Platelets: 108 10*3/uL — ABNORMAL LOW (ref 150–400)
RBC: 3.39 MIL/uL — ABNORMAL LOW (ref 3.87–5.11)
RDW: 16 % — ABNORMAL HIGH (ref 11.5–15.5)
WBC: 9.2 10*3/uL (ref 4.0–10.5)
nRBC: 0 % (ref 0.0–0.2)

## 2019-06-13 LAB — BLOOD CULTURE ID PANEL (REFLEXED)

## 2019-06-13 LAB — DIFFERENTIAL
Abs Immature Granulocytes: 0.1 10*3/uL — ABNORMAL HIGH (ref 0.00–0.07)
Basophils Absolute: 0 10*3/uL (ref 0.0–0.1)
Basophils Relative: 0 %
Eosinophils Absolute: 0 10*3/uL (ref 0.0–0.5)
Eosinophils Relative: 0 %
Immature Granulocytes: 1 %
Lymphocytes Relative: 5 %
Lymphs Abs: 0.4 10*3/uL — ABNORMAL LOW (ref 0.7–4.0)
Monocytes Absolute: 0.6 10*3/uL (ref 0.1–1.0)
Monocytes Relative: 6 %
Neutro Abs: 8.1 10*3/uL — ABNORMAL HIGH (ref 1.7–7.7)
Neutrophils Relative %: 88 %

## 2019-06-13 LAB — COMPREHENSIVE METABOLIC PANEL
ALT: 31 U/L (ref 0–44)
AST: 25 U/L (ref 15–41)
Albumin: 2 g/dL — ABNORMAL LOW (ref 3.5–5.0)
Alkaline Phosphatase: 41 U/L (ref 38–126)
Anion gap: 8 (ref 5–15)
BUN: 53 mg/dL — ABNORMAL HIGH (ref 8–23)
CO2: 26 mmol/L (ref 22–32)
Calcium: 7.3 mg/dL — ABNORMAL LOW (ref 8.9–10.3)
Chloride: 120 mmol/L — ABNORMAL HIGH (ref 98–111)
Creatinine, Ser: 1.22 mg/dL — ABNORMAL HIGH (ref 0.44–1.00)
GFR calc Af Amer: 49 mL/min — ABNORMAL LOW (ref >60)
GFR calc non Af Amer: 43 mL/min — ABNORMAL LOW (ref >60)
Glucose, Bld: 158 mg/dL — ABNORMAL HIGH (ref 70–99)
Potassium: 3.5 mmol/L (ref 3.5–5.1)
Sodium: 154 mmol/L — ABNORMAL HIGH (ref 135–145)
Total Bilirubin: 1.1 mg/dL (ref 0.3–1.2)
Total Protein: 5.2 g/dL — ABNORMAL LOW (ref 6.5–8.1)

## 2019-06-13 LAB — CORTISOL: Cortisol, Plasma: 30.8 ug/dL

## 2019-06-13 LAB — GLUCOSE, CAPILLARY
Glucose-Capillary: 146 mg/dL — ABNORMAL HIGH (ref 70–99)
Glucose-Capillary: 150 mg/dL — ABNORMAL HIGH (ref 70–99)
Glucose-Capillary: 130 mg/dL — ABNORMAL HIGH (ref 70–99)
Glucose-Capillary: 145 mg/dL — ABNORMAL HIGH (ref 70–99)
Glucose-Capillary: 138 mg/dL — ABNORMAL HIGH (ref 70–99)
Glucose-Capillary: 142 mg/dL — ABNORMAL HIGH (ref 70–99)

## 2019-06-13 LAB — TRIGLYCERIDES: Triglycerides: 246 mg/dL — ABNORMAL HIGH (ref <150)

## 2019-06-13 LAB — PREALBUMIN: Prealbumin: 17.6 mg/dL — ABNORMAL LOW (ref 18–38)

## 2019-06-13 LAB — MAGNESIUM: Magnesium: 2.7 mg/dL — ABNORMAL HIGH (ref 1.7–2.4)

## 2019-06-13 LAB — PHOSPHORUS: Phosphorus: 3.2 mg/dL (ref 2.5–4.6)

## 2019-06-13 MED ORDER — TRACE MINERALS CU-MN-SE-ZN 300-55-60-3000 MCG/ML IV SOLN
INTRAVENOUS | Status: DC
  Filled 2019-06-13: qty 1992

## 2019-06-13 MED ORDER — FREE WATER
200.0000 mL | Status: DC
  Administered 2019-06-13 – 2019-06-14 (×7): 200 mL

## 2019-06-13 MED ORDER — VITAL 1.5 CAL PO LIQD
1000.0000 mL | ORAL | Status: DC
  Administered 2019-06-13: 1000 mL

## 2019-06-13 NOTE — Consult Note (Signed)
Referring Physician: Lanney Gins    Chief Complaint: Susan [name]  HPI: Susan [name] is an 79 y.o. female who is intubated and sedated therefore unable to provide any history.  All history obtained from the chart.  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  Weaning trial attempted on 1/19 but was not successful.  Patient noted at that time to have some focal weakness and further imaging performed. Initial NIHSS of 20.    Date last known well: Unable to determine Time last known well: Unable to determine tPA Given: No: Unable to determine LKW and recent surgery  Past Medical History:  Diagnosis Date  . Acute peptic ulcer, unspecified site, with hemorrhage and perforation, without mention of obstruction   . Arthritis   . Asthma   . Balance problems    unsteady on feet at time  . Cerebral amyloid angiopathy ([place])    a. 06/2017 admit w/ aphasia and blurred vision.  RI w/ extensive chronic microhemorrhages c/w amyloid angiopathy-->ASA initiated.  . Chronic airway obstruction, not elsewhere classified    on inhalers  . Diastolic dysfunction    a. 06/2017 Echo: EF 50-55%, no rwma, Gr1 DD, nl LA size, nl RV fxn.  . Diverticulosis of colon (without mention of hemorrhage)   . Hematochezia   . 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 Morbid obesity ([place])   . Non-obstructive CAD (coronary artery disease)    a. 12/2012 MV: EF 71%, no ischemia; b. 06/2016 Cath: LM nl, LAD 50p, LCX nl, RCA 3m EF 55-65%.  . OSA (obstructive sleep apnea)   . Osteoarthritis    a. s/p bilat hip replacements.  .Marland KitchenUTI (urinary tract infection)    a. 09/2017 admit for E coli & Klebsiella UTI.  . Ventral hernia     Past Surgical History:  Procedure Laterality  Date  . APPENDECTOMY    . APPLICATION OF WOUND VAC N/A 06/01/2019   Procedure: APPLICATION OF WOUND VAC;  Surgeon: CHerbert Pun MD;  Location: [facility];  Service: General;  Laterality: N/A;;  [id number] . BUNIONECTOMY Bilateral   . COLOSTOMY N/A 05/26/2019   Procedure: COLOSTOMY;  Surgeon: CHerbert Pun MD;  Location: [facility] OR[facility];  Service: General;  Laterality: N/A;  . COSMETIC SURGERY  1970's   abdomen. Tummy Tuck  . FOOT SURGERY Bilateral dec 2015 right    left 3 yrs ago  . LAPAROTOMY N/A 05/25/2019   Procedure: EXPLORATORY LAPAROTOMY;  Surgeon: CHerbert Pun MD;  Location: [facility];  Service: General;  Laterality: N/A;  . RIGHT/LEFT HEART CATH AND CORONARY ANGIOGRAPHY Bilateral 07/08/2016   Procedure: Right/Left Heart Cath and Coronary Angiography;  Surgeon: TMinna Merritts MD;  [facility];  Service: Cardiovascular;  Laterality: Bilateral;  . TOTAL HIP ARTHROPLASTY  2007   left  . TOTAL HIP ARTHROPLASTY     bilateral   . TOTAL KNEE ARTHROPLASTY Right 08/08/2014   Procedure: RIGHT TOTAL KNEE ARTHROPLASTY WITH NAVIGATION;  Surgeon: BRod Can MD;  Location: [facility];  Service: Orthopedics;  Laterality: Right;  . TOTAL KNEE ARTHROPLASTY Left 10/03/2014   Procedure: LEFT TOTAL KNEE ARTHROPLASTY;  Surgeon: BRod Can MD;  Location: [facility];  Service: Orthopedics;  Laterality: Left;  . TUBAL LIGATION    . UMBILICAL HERNIA REPAIR     2016  Family History  Problem Relation Age of Onset  . Diabetes Mother   . Coronary artery disease Mother   . Lung disease Mother   . Heart disease Mother        CAD  . Coronary artery disease Father   . Kidney disease Father   . Diabetes Father   . Heart disease Father        CAD  . Cancer Neg Hx    Social History:  reports that she quit smoking about 18 years ago. Her smoking use included cigarettes. She has a 20.00 pack-year smoking history. She has never used smokeless tobacco. She reports that  she does not drink alcohol or use drugs.  Allergies: No Known Allergies  Medications:  I have reviewed the patient's current medications. Prior to Admission:  Medications Prior to Admission  Medication Sig Dispense Refill Last Dose  . ascorbic acid (VITAMIN C) 500 MG tablet Take 1 tablet (500 mg total) by mouth daily.   unknown at unknown  . budesonide-formoterol (SYMBICORT) 160-4.5 MCG/ACT inhaler TAKE 2 PUFFS INTO LUNGS TWICE A DAY (Patient taking differently: Inhale 2 puffs into the lungs 2 (two) times daily. ) 1 Inhaler 11 unknown at unknown  . [EXPIRED] dexamethasone (DECADRON) 6 MG tablet Take 1 tablet (6 mg total) by mouth daily for 7 days. 7 tablet 0 unknown at unknown  . diltiazem (CARDIZEM CD) 180 MG 24 hr capsule Take 1 capsule (180 mg total) by mouth daily. 90 capsule 3 unknown at unknown  . feeding supplement, ENSURE ENLIVE, (ENSURE ENLIVE) LIQD Take 237 mLs by mouth 3 (three) times daily between meals. 237 mL 12 unknown at unknown  . guaiFENesin-dextromethorphan (ROBITUSSIN DM) 100-10 MG/5ML syrup Take 10 mLs by mouth every 4 (four) hours as needed for cough. 118 mL 0 prn at prn  . Multiple Vitamin (MULTIVITAMIN WITH MINERALS) TABS tablet Take 1 tablet by mouth daily.   unknown at unknown  . rosuvastatin (CRESTOR) 5 MG tablet Take 1 tablet (5 mg total) by mouth daily. 90 tablet 3 unknown at unknown  . tiotropium (SPIRIVA) 18 MCG inhalation capsule Place 1 capsule (18 mcg total) into inhaler and inhale daily. 30 capsule 12 unknown at unknown  . traZODone (DESYREL) 50 MG tablet TAKE 1 & 1/2 TABLETS BY MOUTH AT [facility] NEEDED FOR SLEEP (Patient taking differently: Take 75 mg by mouth at bedtime as needed for sleep. ) 135 tablet 1 prn at prn  . ZOLOFT 25 MG tablet Take 25 mg by mouth daily.    unknown at unknown  . donepezil (ARICEPT) 5 MG tablet Take 5 mg by mouth daily.    Unknown at Unknown time   Scheduled: . aspirin EC  81 mg Oral Daily  . atorvastatin  40 mg Oral q1800  .  chlorhexidine gluconate (MEDLINE KIT)  15 mL Mouth Rinse BID  . Chlorhexidine Gluconate Cloth  6 each Topical Daily  . fentaNYL (SUBLIMAZE) injection  25 mcg Intravenous Once  . free water  200 mL Per Tube Q4H  . glycopyrrolate  0.1 mg Intravenous TID  . heparin injection (subcutaneous)  5,000 Units Subcutaneous Q8H  . insulin aspart  0-9 Units Subcutaneous Q4H  . mouth rinse  15 mL Mouth Rinse 10 times per day  . sodium chloride flush  10-40 mL Intracatheter Q12H  . sodium chloride flush  10-40 mL Intracatheter Q12H    ROS: Unable to provide due to mental status  Physical Examination: Blood pressure 121/60, pulse 60, temperature 98.6  F (37 C), temperature source Axillary, resp. rate 20, height 5' 2.99" (1.6 m), weight 100.2 kg, SpO2 94 %.  HEENT-  Normocephalic, no lesions, without obvious abnormality.  Normal external eye and conjunctiva.  Normal TM's bilaterally.  Normal auditory canals and external ears. Normal external nose, mucus membranes and septum.  Normal pharynx. Cardiovascular- S1, S2 normal, pulses palpable throughout   Lungs- chest clear, no wheezing, rales, normal symmetric air entry Abdomen- soft, non-tender; bowel sounds normal; no masses,  no organomegaly Extremities- mild LE edema Lymph-no adenopathy palpable Musculoskeletal-no joint tenderness, deformity or swelling Skin-warm and dry, no hyperpigmentation, vitiligo, or suspicious lesions  Neurological Examination   Mental Status: Grimaces to sternal rub.  Does not open eyes.  Does not follow commands.  No attempts at speech Cranial Nerves: II: patient does not respond confrontation bilaterally, pupils right 3 mm, left 3 mm,and reactive bilaterally III,IV,VI: Oculocephalic response present bilaterally with some abnormal cranial testing noted on the left eye where there appear some restriction of medial eye movement.   V,VII: corneal reflex reduced bilaterally  VIII: patient does not respond to verbal  stimuli IX,X: gag reflex reduced, XI: trapezius strength unable to test bilaterally XII: tongue strength unable to test Motor: Patient with spontaneous movement noted on the left, particularly the LUE.  Minimal movement noted on the right.   Sensory: When noxious stimuli applied to any extremity the patient grimaces and lifts the LUE Deep Tendon Reflexes:  Symmetric throughout. Plantars: upgoing on the right, mute on the left Cerebellar: Unable to perform   Laboratory Studies:  Basic Metabolic Panel: Recent Labs  Lab 06/09/2019 2017 05/31/2019 2017 06/09/19 0608 06/09/19 0608 06/10/19 0656 06/11/19 0513 06/13/19 0330  NA 137  --  141  --  143 147* 154*  K 5.0  --  4.7  --  4.7 4.1 3.5  CL 108  --  112*  --  114* 116* 120*  CO2 22  --  24  --  19* 21* 26  GLUCOSE 213*  --  174*  --  159* 166* 158*  BUN 42*  --  49*  --  69* 73* 53*  CREATININE 1.71*  --  1.76*  --  2.02* 1.73* 1.22*  CALCIUM 7.3*   < > 7.2*   < > 7.2* 7.4* 7.3*  MG  --   --  2.1  --  2.6*  --  2.7*  PHOS  --   --  3.8  --  5.4*  --  3.2   < > = values in this interval not displayed.    Liver Function Tests: Recent Labs  Lab 05/24/2019 0900 06/02/2019 2017 06/09/19 0608 06/11/19 0513 06/13/19 0330  AST 21 73* 61* 38 25  ALT 33 60* 58* 50* 31  ALKPHOS 70 53 43 42 41  BILITOT 1.1 1.2 2.3* 1.1 1.1  PROT 6.5 5.3* 5.1* 5.4* 5.2*  ALBUMIN 2.8* 2.3* 2.0* 1.9* 2.0*   No results for input(s): LIPASE, AMYLASE in the last 168 hours. No results for input(s): AMMONIA in the last 168 hours.  CBC: Recent Labs  Lab 06/22/2019 0900 05/25/2019 0900 06/13/2019 2017 06/13/2019 2017 06/09/19 0608 06/10/19 0656 06/11/19 0513 06/11/19 1355 06/13/19 0330  WBC 13.9*   < > 16.4*   < > 13.1* 14.2* 13.4* 15.5* 9.2  NEUTROABS 12.2*  --  15.9*  --  12.0*  --  11.8*  --  8.1*  HGB 16.1*   < > 14.8   < > 13.4  12.4 10.9* 11.5* 9.9*  HCT 49.1*   < > 47.2*   < > 42.4 40.1 35.1* 36.4 31.7*  MCV 89.1   < > 93.5   < > 91.6 94.8 93.6  93.1 93.5  PLT 237   < > 217   < > 152 146* 125* 124* 108*   < > = values in this interval not displayed.    Cardiac Enzymes: No results for input(s): CKTOTAL, CKMB, CKMBINDEX, TROPONINI in the last 168 hours.  BNP: Invalid input(s): POCBNP  CBG: Recent Labs  Lab 06/12/19 1533 06/12/19 1918 06/12/19 2313 06/13/19 0315 06/13/19 0802  GLUCAP 87 85 178* 146* 150*    Microbiology: Results for orders placed or performed during the hospital encounter of 05/24/2019  Blood Culture (routine x 2)     Status: None (Preliminary result)   Collection Time: 06/23/2019  9:00 AM   Specimen: BLOOD  Result Value Ref Range Status   Specimen Description   Final    BLOOD BLOOD LEFT FOREARM Performed at [facility], [place] [address], [place], [place] [address]    Special Requests   Final    BOTTLES DRAWN AEROBIC AND ANAEROBIC Blood Culture adequate volume   Culture  Setup Time   Final    ANAEROBIC BOTTLE ONLY GRAM POSITIVE RODS CRITICAL RESULT CALLED TO, READ BACK BY AND VERIFIED WITH: JASON ROBBINS AT 3545 ON 06/11/2019 [place]. Performed at [facility], [address] [place], [place] [address]    Culture GRAM POSITIVE RODS  Final   Report Status PENDING  Incomplete  Blood Culture (routine x 2)     Status: None   Collection Time: 05/30/2019  9:00 AM   Specimen: BLOOD  Result Value Ref Range Status   Specimen Description BLOOD L HAND  Final   Special Requests   Final    BOTTLES DRAWN AEROBIC AND ANAEROBIC Blood Culture adequate volume   Culture  Setup Time   Final    Organism ID to follow GRAM NEGATIVE RODS ANAEROBIC BOTTLE ONLY CRITICAL RESULT CALLED TO, READ BACK BY AND VERIFIED WITH: Performed at [facility], [address], [place], [place] [address]    Culture GRAM NEGATIVE RODS  Final   Report Status 06/13/2019 FINAL  Final  Urine culture     Status: Abnormal   Collection Time: 05/31/2019  9:55 AM   Specimen: In/Out Cath Urine  Result Value Ref Range  Status   Specimen Description   Final    IN/OUT CATH URINE Performed at [facility], [address], [place], [place] [address]    Special Requests   Final    NONE Performed at [facility], [address], [place], [place] [address]    Culture (A)  Final    >=100,000 COLONIES/mL ENTEROCOCCUS FAECALIS 50,000 COLONIES/mL GLOBICATELLA SANGUINIS ORGANISM 2 Standardized susceptibility testing for this organism is not available. Performed at [facility], [place] [address], [place], [place] [address]    Report Status 06/11/2019 FINAL  Final   Organism ID, Bacteria ENTEROCOCCUS FAECALIS (A)  Final      Susceptibility   Enterococcus faecalis - MIC*    AMPICILLIN 4 SENSITIVE Sensitive     NITROFURANTOIN <=16 SENSITIVE Sensitive     VANCOMYCIN 2 SENSITIVE Sensitive     * >=100,000 COLONIES/mL ENTEROCOCCUS FAECALIS  MRSA PCR Screening     Status: None   Collection Time: 06/10/2019  5:00 PM   Specimen: Nasopharyngeal  Result Value Ref  Range Status   MRSA by PCR NEGATIVE NEGATIVE Final    Comment:        The GeneXpert MRSA Assay (FDA approved for NASAL specimens only), is one component of a comprehensive MRSA colonization surveillance program. It is not intended to diagnose MRSA infection nor to guide or monitor treatment for MRSA infections. Performed at [facility], [address] [place], [place] [address]     Coagulation Studies: No results for input(s): LABPROT, INR in the last 72 hours.  Urinalysis:  Recent Labs  Lab 06/01/2019 0955  COLORURINE YELLOW*  LABSPEC 1.023  PHURINE 6.0  GLUCOSEU NEGATIVE  HGBUR NEGATIVE  BILIRUBINUR NEGATIVE  KETONESUR NEGATIVE  PROTEINUR NEGATIVE  NITRITE NEGATIVE  LEUKOCYTESUR NEGATIVE    Lipid Panel:    Component Value Date/Time   CHOL 129 04/26/2019 1534   TRIG 246 (H) 06/13/2019 0330   HDL 41 (L) 04/26/2019 1534   CHOLHDL 3.1 04/26/2019 1534   VLDL 8 04/18/2016 0525   LDLCALC 67 04/26/2019  1534    HgbA1C:  Lab Results  Component Value Date   HGBA1C 6.1 (H) 06/02/2019    Urine Drug Screen:      Component Value Date/Time   LABOPIA NONE DETECTED 07/06/2017 1611   COCAINSCRNUR NONE DETECTED 07/06/2017 1611   LABBENZ NONE DETECTED 07/06/2017 1611   AMPHETMU NONE DETECTED 07/06/2017 1611   THCU NONE DETECTED 07/06/2017 1611   LABBARB NONE DETECTED 07/06/2017 1611    Alcohol Level: No results for input(s): ETH in the last 168 hours.  Other results: EKG: sinus tachycardia at 106 bpm.  Imaging: CT HEAD WO CONTRAST  Result Date: 06/12/2019 CLINICAL DATA:  Neurological deficit, COVID pneumonia, septic shock, question stroke, on ventilator for acute on chronic respiratory failure with hypoxia, underlying COPD EXAM: CT HEAD WITHOUT CONTRAST TECHNIQUE: Contiguous axial images were obtained from the base of the skull through the vertex without intravenous contrast. Sagittal and coronal MPR images reconstructed from axial data set. COMPARISON:  06/01/2019 FINDINGS: Brain: Generalized atrophy. New large LEFT MCA territory infarct with diffuse edema of the LEFT hemisphere extending to midline at vertex. LEFT RIGHT midline shift of 5 mm with compression of the LEFT lateral ventricle. Additional infarct RIGHT cerebellar hemisphere. Chronic small vessel ischemic changes of deep cerebral white matter. Curvilinear foci of high attenuation within the LEFT MCA territory infarct in a gyriform pattern consistent with petechial hemorrhage. No extra-axial fluid collections or mass lesion. Vascular: Minimal atherosclerotic calcifications of internal carotid arteries at skull base Skull: Demineralized but intact Sinuses/Orbits: Small air-fluid levels within sphenoid sinus. Partial opacification of RIGHT mastoid air cells. Other: N/A IMPRESSION: Large subacute LEFT MCA territory infarct with scattered gyriform petechial hemorrhage and 5 mm of LEFT to RIGHT midline shift. Additional new infarct RIGHT  cerebellar hemisphere. Findings called to [facility] in ICU on 06/12/2019 at 1811 hours. Electronically Signed   By: Lavonia Dana M.D.   On: 06/12/2019 18:13   DG Chest Port 1 View  Result Date: 06/12/2019 CLINICAL DATA:  Status post PICC line placement.  COVID-19 positive. EXAM: PORTABLE CHEST 1 VIEW COMPARISON:  06/10/2019 FINDINGS: Left PICC line tip at mid SVC. Patient rotated right. Nasogastric tube extends beyond the inferior aspect of the film. Endotracheal tube terminates 3.7 cm above carina. Borderline cardiomegaly. Tortuous thoracic aorta. No pleural effusion or pneumothorax. Extremely low lung volumes, with resolved pulmonary interstitial prominence. Similar right greater than left infrahilar airspace disease and volume loss. IMPRESSION: Left PICC line terminating at mid SVC,  without pneumothorax. Otherwise, relatively similar appearance of the chest with low lung volumes and bibasilar/infrahilar airspace disease, favoring atelectasis. Electronically Signed   By: Abigail Miyamoto M.D.   On: 06/12/2019 15:21   [place] EKG SITE RITE  Result Date: 06/12/2019 If Site Rite image not attached, placement could not be confirmed due to current cardiac rhythm.   Assessment: 79 y.o. female 79 y.o. female who is intubated and sedated therefore unable to provide any history.  All history obtained from the chart.  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  Weaning trial attempted on 1/19 but was not successful.  Patient noted at that time to have some focal weakness and further imaging performed.  Head CT reviewed and shows a large subacute left MCA territory infarct with associated petechial hemorrhage and new right cerebellar hemisphere infarct from previous imaging  of 1/9.  Etiology likely cardioembolic. On ASA and statin.  Echocardiogram shows no cardiac source of emboli with an EF of 55-60% and mildly dilated left atrium.  A1c 6.1 (06/12/2019). Unfortunately these head CT findings will likely be significant for the patient that already had underlying cognitive and gait issues.  Also can not rule out other recent ischemic disease that was not able to be appreciated on CT.  Patient would be expected to have significant speech deficits interfering with her ability to produce and understand speech (complicating rehab efforts) and right sided weakness from her left MCA infarct.  Her other ischemic disease will only compound her deficits and further interfere with her functional recovery.    Stroke Risk Factors - OSA  Plan: 1. LDL 2. Carotid dopplers 3. PT consult, OT consult, Speech consult 4. Agree with ASA daily 5. NPO until speech evaluation 6. Telemetry monitoring 7. Frequent neuro checks 8. Agree with need for discussion with family concerning goals of care   Alexis Goodell, MD Neurology [phone] 06/13/2019, 10:33 AM

## 2019-06-13 NOTE — Progress Notes (Signed)
Name: Susan [name] MRN: [id number] DOB: 02-03-1941    ADMISSION DATE:  06/18/2019 INITIAL CONSULTATION DATE: 06/05/2019  REFERRING MD : Dr. Windell Moment   FOLLOW UP:Ventilator dependence, acute on chronic respiratory failure with hypoxia.  BRIEF PATIENT DESCRIPTION:  79 yo female recently treated for COVID-19 pneumonia now admitted with septic shock secondary to diverticular disease with perforation s/p hartmann's procedure remained  intubated mechanically ventilated postop.  She has acute on chronic respiratory failure with hypoxia.  Underlying COPD with chronic oxygen use.  SIGNIFICANT EVENTS/STUDIES:  01/16: CT Abd Pelvis revealed free air and fluid throughout the abdomen compatible with bowel perforation. Extensive inflammatory changes about the colon beginning at the splenic flexure. Changes are most profound at the sigmoid colon, consistent with diverticulitis. This is most likely source of perforation. Diffuse colonic diverticulosis. Atherosclerosis. Stable low-density lesions in the liver. These are likely benign. Patchy bilateral airspace disease likely reflects atelectasis or infection. Multilevel degenerative changes and scoliosis of the lumbar spine.  01/16: General surgery consulted pt transported for emergent surgery 01/16: Pt admitted with diverticular disease with perforation s/p hartmann's procedure remained mechanically ventilated postop, hemodynamics tenuous 01/17:Remains on ventilator, started Dopamine 1/18: -atelectasis bilaterally, leukocytosis poss due to steroids, background of ILD per pulm. 1/19-  Weaning trial today. Still no bowel sounds remains NPO, vitals stable with improved hemodynamics off pressors and good urine output.  1/20 - repeat weaning trial today, secretions improved, will discuss with surgery Relistor to move GI tract & nourishment plan & Diflucan need. Plan for extubation if possible today. If mentation not improved will image brain for CVA.  1/21  - CTH with large sub-acute CVA, discussed with neurology - poor prognosis.  Discussed with POA daughter Reubin Milan, Palliative is on case, goals of care discussion today.   Prior to Admission medications   Medication Sig Start Date End Date Taking? Authorizing Provider  ascorbic acid (VITAMIN C) 500 MG tablet Take 1 tablet (500 mg total) by mouth daily. 06/05/19   Nicole Kindred A, DO  budesonide-formoterol (SYMBICORT) 160-4.5 MCG/ACT inhaler TAKE 2 PUFFS INTO LUNGS TWICE A DAY Patient taking differently: Inhale 2 puffs into the lungs 2 (two) times daily.  05/28/19   Bedsole, Amy E, MD  dexamethasone (DECADRON) 6 MG tablet Take 1 tablet (6 mg total) by mouth daily for 7 days. 06/05/19 06/12/19  Ezekiel Slocumb, DO  diltiazem (CARDIZEM CD) 180 MG 24 hr capsule Take 1 capsule (180 mg total) by mouth daily. 03/05/19   Minna Merritts, MD  donepezil (ARICEPT) 5 MG tablet Take 5 mg by mouth daily.  05/08/19 05/07/20  [provider]  feeding supplement, ENSURE ENLIVE, (ENSURE ENLIVE) LIQD Take 237 mLs by mouth 3 (three) times daily between meals. 06/05/19   Ezekiel Slocumb, DO  guaiFENesin-dextromethorphan (ROBITUSSIN DM) 100-10 MG/5ML syrup Take 10 mLs by mouth every 4 (four) hours as needed for cough. 06/05/19   Ezekiel Slocumb, DO  Multiple Vitamin (MULTIVITAMIN WITH MINERALS) TABS tablet Take 1 tablet by mouth daily. 06/05/19   Nicole Kindred A, DO  OXYGEN Inhale 2 L into the lungs daily.    [provider]  rosuvastatin (CRESTOR) 5 MG tablet Take 1 tablet (5 mg total) by mouth daily. 03/05/19   Minna Merritts, MD  tiotropium (SPIRIVA) 18 MCG inhalation capsule Place 1 capsule (18 mcg total) into inhaler and inhale daily. 05/28/19   Bedsole, Amy E, MD  traZODone (DESYREL) 50 MG tablet TAKE 1 & 1/2 TABLETS BY MOUTH  AT [place] Patient taking differently: Take 75 mg by mouth at bedtime as needed for sleep.  04/13/18   Bedsole, Amy E, MD  ZOLOFT 25 MG tablet Take  25 mg by mouth daily.  04/10/19   [provider]   No Known Allergies   REVIEW OF SYSTEMS:   Unable to assess, pt intubated   SUBJECTIVE:  Unable to assess, pt. intubated   VITAL SIGNS: Temp:  [97.6 F (36.4 C)-98.6 F (37 C)] 98.6 F (37 C) (01/21 0800) Pulse Rate:  [49-121] 55 (01/21 0900) Resp:  [17-27] 20 (01/21 0900) BP: (113-173)/(53-98) 121/67 (01/21 0900) SpO2:  [92 %-97 %] 95 % (01/21 0900) FiO2 (%):  [30 %] 30 % (01/21 0823)  PHYSICAL EXAMINATION: General:chronically on  acutely ill appearing female, on ventilator, sedated, asynchronous when sedation lightened Neuro: sedated, not following commands, PERRL  HEENT: Orotracheally intubated, neck supple, trachea midline no crepitus Cardiovascular: sinus tach, no R/G , telemetry with PVCs Lungs: diminished throughout, even, non labored  Abdomen: no audible BS, soft, midline incision with wound vac intact, colostomy present  Musculoskeletal: normal bulk and tone, no edema  Skin: midline incision with wound vac intact, colostomy present    Recent Labs  Lab 06/10/19 0656 06/11/19 0513 06/13/19 0330  NA 143 147* 154*  K 4.7 4.1 3.5  CL 114* 116* 120*  CO2 19* 21* 26  BUN 69* 73* 53*  CREATININE 2.02* 1.73* 1.22*  GLUCOSE 159* 166* 158*   Recent Labs  Lab 06/11/19 0513 06/11/19 1355 06/13/19 0330  HGB 10.9* 11.5* 9.9*  HCT 35.1* 36.4 31.7*  WBC 13.4* 15.5* 9.2  PLT 125* 124* 108*   CT HEAD WO CONTRAST  Result Date: 06/12/2019 CLINICAL DATA:  Neurological deficit, COVID pneumonia, septic shock, question stroke, on ventilator for acute on chronic respiratory failure with hypoxia, underlying COPD EXAM: CT HEAD WITHOUT CONTRAST TECHNIQUE: Contiguous axial images were obtained from the base of the skull through the vertex without intravenous contrast. Sagittal and coronal MPR images reconstructed from axial data set. COMPARISON:  06/01/2019 FINDINGS: Brain: Generalized atrophy. New large LEFT MCA  territory infarct with diffuse edema of the LEFT hemisphere extending to midline at vertex. LEFT RIGHT midline shift of 5 mm with compression of the LEFT lateral ventricle. Additional infarct RIGHT cerebellar hemisphere. Chronic small vessel ischemic changes of deep cerebral white matter. Curvilinear foci of high attenuation within the LEFT MCA territory infarct in a gyriform pattern consistent with petechial hemorrhage. No extra-axial fluid collections or mass lesion. Vascular: Minimal atherosclerotic calcifications of internal carotid arteries at skull base Skull: Demineralized but intact Sinuses/Orbits: Small air-fluid levels within sphenoid sinus. Partial opacification of RIGHT mastoid air cells. Other: N/A IMPRESSION: Large subacute LEFT MCA territory infarct with scattered gyriform petechial hemorrhage and 5 mm of LEFT to RIGHT midline shift. Additional new infarct RIGHT cerebellar hemisphere. Findings called to [facility] in ICU on 06/12/2019 at 1811 hours. Electronically Signed   By: Lavonia Dana M.D.   On: 06/12/2019 18:13   DG Chest Port 1 View  Result Date: 06/12/2019 CLINICAL DATA:  Status post PICC line placement.  COVID-19 positive. EXAM: PORTABLE CHEST 1 VIEW COMPARISON:  06/10/2019 FINDINGS: Left PICC line tip at mid SVC. Patient rotated right. Nasogastric tube extends beyond the inferior aspect of the film. Endotracheal tube terminates 3.7 cm above carina. Borderline cardiomegaly. Tortuous thoracic aorta. No pleural effusion or pneumothorax. Extremely low lung volumes, with resolved pulmonary interstitial prominence. Similar right greater than left  infrahilar airspace disease and volume loss. IMPRESSION: Left PICC line terminating at mid SVC, without pneumothorax. Otherwise, relatively similar appearance of the chest with low lung volumes and bibasilar/infrahilar airspace disease, favoring atelectasis. Electronically Signed   By: Abigail Miyamoto M.D.   On: 06/12/2019 15:21   [place] EKG SITE  RITE  Result Date: 06/12/2019 If Site Rite image not attached, placement could not be confirmed due to current cardiac rhythm.        ASSESSMENT / PLAN:   Large subacute CVA  - discussed with neurology - appreciate input  - not tPA candidate s/p Harmans procedure  - family discourse today regarding goals of care  - overal poor prognosis  - Palliative on case - appreciate input   - asa/statin given   - PT/OT post GOC if indicated    Acute hypercapnic respiratory failure secondary to recent COVID-19 pneumonia and sedating medications  Mechanical intubation  Hx: OSA, Asthma, COPD, ILD, and Morbid Obesity  Full vent support for now-vent settings reviewed with RT SBT once all parameters met  VAP bundle implemented  Scheduled and prn bronchodilator therapy  IV steroid: stopped  Maintain airborne and contact precautions    Hypotension secondary to septic shock and hypovolemia -resolved Hx: Non-obstructive CAD, and Chronic diastolic CHF Continuous telemetry monitoring  Levophed for vasopressor support-no longer indicated as BP is wnl Hold outpatient cardizem Check 2D echo-grade 1 diastolic CHF   Acute renal failure secondary to septic shock  Lactic acidosis  Trend BMP and lactic acid  Replace electrolytes as indicated  Monitor UOP Avoid nephrotoxic medications     Transaminitis secondary to sepsis  Trend hepatic panel    Septic shock secondary to diverticular disease with perforation s/p Hartmann's procedure-06/07/2019 Trend WBC and monitor fever curve  Trend PCT  Follow cultures  Continue zosyn, add fluconazole -e. Fecalis + urine culture-pansensitive   Steroid induced hyperglycemia  CBG's q4hrs  SSI   Postop pain  Mechanical ventilation pain/discomfort  Hx: Stroke, Cerebral Amyloid Angiopathy, and Mixed Alzheimer's and Vascular Dementia Maintain RASS goal -1 to -2 Propofol and fentanyl gtts to maintain RASS goal and for pain management  WUA daily This  issue will add complexity to her management and weaning abilities  Best Practice: VTE px: SCD's SUP px: iv protonix  Diet: keep NPO for now will defer to general surgery    Critical care provider statement:    Critical care time (minutes):  109   Critical care time was exclusive of:  Separately billable procedures and  treating other patients   Critical care was necessary to treat or prevent imminent or  life-threatening deterioration of the following conditions:  COVID19, ILD, diverticular abcess s/p harmans procedure, acute hypoxemic respiratory failure.    Critical care was time spent personally by me on the following  activities:  Development of treatment plan with patient or surrogate,  discussions with consultants, evaluation of patient's response to  treatment, examination of patient, obtaining history from patient or  surrogate, ordering and performing treatments and interventions, ordering  and review of laboratory studies and re-evaluation of patient's condition   I assumed direction of critical care for this patient from another  provider in my specialty: no       Ottie Glazier, M.D.  Pulmonary & [place]

## 2019-06-13 NOTE — Progress Notes (Signed)
PHARMACY - PHYSICIAN COMMUNICATION CRITICAL VALUE ALERT - BLOOD CULTURE IDENTIFICATION (BCID)  Susan [name] is an 79 y.o. female who presented to [facility] on 06/05/2019  Assessment: 1/4 (anaerobic) GNR, no identification  Name of physician (or Provider) Contacted: Dr. Lanney Gins  Current antibiotics: Zosyn  Changes to prescribed antibiotics recommended: no change  Results for orders placed or performed during the hospital encounter of 06/04/2019  Blood Culture ID Panel (Reflexed) (Collected: 05/26/2019  9:00 AM)  Result Value Ref Range   Enterococcus species NOT DETECTED NOT DETECTED   Listeria monocytogenes NOT DETECTED NOT DETECTED   Staphylococcus species NOT DETECTED NOT DETECTED   Staphylococcus aureus (BCID) NOT DETECTED NOT DETECTED   Streptococcus species NOT DETECTED NOT DETECTED   Streptococcus agalactiae NOT DETECTED NOT DETECTED   Streptococcus pneumoniae NOT DETECTED NOT DETECTED   Streptococcus pyogenes NOT DETECTED NOT DETECTED   Acinetobacter baumannii NOT DETECTED NOT DETECTED   Enterobacteriaceae species NOT DETECTED NOT DETECTED   Enterobacter cloacae complex NOT DETECTED NOT DETECTED   Escherichia coli NOT DETECTED NOT DETECTED   Klebsiella oxytoca NOT DETECTED NOT DETECTED   Klebsiella pneumoniae NOT DETECTED NOT DETECTED   Proteus species NOT DETECTED NOT DETECTED   Serratia marcescens NOT DETECTED NOT DETECTED   Haemophilus influenzae NOT DETECTED NOT DETECTED   Neisseria meningitidis NOT DETECTED NOT DETECTED   Pseudomonas aeruginosa NOT DETECTED NOT DETECTED   Candida albicans NOT DETECTED NOT DETECTED   Candida glabrata NOT DETECTED NOT DETECTED   Candida krusei NOT DETECTED NOT DETECTED   Candida parapsilosis NOT DETECTED NOT DETECTED   Candida tropicalis NOT DETECTED NOT [place], PharmD 06/13/2019  2:46 PM

## 2019-06-13 NOTE — Progress Notes (Signed)
Assisted tele visit to patient with daughter.  Khyla Mccumbers P, RN  

## 2019-06-13 NOTE — Progress Notes (Signed)
Palliative: Susan [name] is lying quietly in bed. She is intubated/ventilated. Conference with attending and bedside nursing staff related to patient condition, needs. Unfortunately, imaging shows that she has had a stroke.  Call to daughter, Reubin Milan at [phone].  [name] and her Sister [name] are on the call.  They are both tearful stating they spoke with the physician last night understand that their mother has had a stroke.  They share that Susan [name] would never want to live in her current state, she would "not want to be hooked up to machines".  We talked about compassionate extubation, unburdening Susan [name] from life support.  We talked in detail about what this would look like, feel like.  [name] and [name] states that they would like to come and see [name] while she is "still alive".  (Conference with ICU nursing staff related to visiting guidelines.)  Susan [name] has 3 children, [name], and [name].  Conference with [facility] related to visitation needs.  We talk about timeframe for compassionate extubation.  [name] and [place] shared that they need time for decision-making about time frame.  We talk about possible video chat during extubation, also empower [name] and [name] that they can say their goodbyes when they visit tonight if they so choose.  Conference with attending, bedside nursing staff, neurologist, [facility] related to goals of care discussion, family visitation tonight, anticipated compassionate extubation 1/22, time TBD.  Plan: Compassionate extubation 1/22.  Time undecided.  Family is to visit tonight, likely due video chat for compassionate extubation tomorrow.  Family encouraged to discuss timeframe with nursing staff when they arrive tonight.  65 minutes, extended time Quinn Axe, NP Palliative Medicine Team Team Phone # [phone] Greater than 50% of this time was spent counseling and coordinating care related to the above assessment and plan.

## 2019-06-13 NOTE — Progress Notes (Addendum)
PHARMACY - TOTAL PARENTERAL NUTRITION CONSULT NOTE    Patient Measurements: Height: 5' 2.99" (160 cm) Weight: 220 lb 14.4 oz (100.2 kg) IBW/kg (Calculated) : 52.38 TPN AdjBW (KG): 63.3 Body mass index is 39.14 kg/m.  Assessment: 79 year old female with perforated diverticulitis s/p partial colectomy with end colostomy and intra-abdominal abscess drainage 1/16. Patient remains intubated, non-responsive when off sedation. Pharmacy consulted to start TPN.  Glucose / Insulin: glucose range 70-166, SSI for now Electrolytes: WNL Renal: SCr 1.73 LFTs / TGs: WNL Prealbumin / albumin: albumin 1.9 Surgeries / Procedures: Hartmann's procedure 1/16  Central access: PICC pending TPN start date: 1/20 (assuming PICC placement)  Nutritional Goals (per RD recommendation on 1/20): kCal: 2100-2400, Protein: 105-120 g, Fluid: > 1.6 L/day Goal TPN rate is 83 mL/hr (provides 100 g of protein and 1753 kcals per day)  Current Nutrition: NPO  Plan:  Increase TPN to 83 mL/hr at 1800. Start trickle feeds. Free water flushes at 200 ml q4h for increased Na. Hold lipid emulsion for first 7 days for critically ill patients per ASPEN guidelines (Start date 1/24) Electrolytes in TPN: 30mEq/L of Na, 52mEq/L of K, 57mEq/L of Ca, 105mEq/L of Mg, and 25mmol/L of Phos. Cl:Ac ratio 1:1 Add trace elements to TPN daily, MVI every Mon/Thurs in setting of shortage Add thiamine 100 mg daily x 3 days (last day 1/22) Monitor TPN labs on Mon/Thurs. Will follow magnesium, potassium and phosphorus daily for at least 3 days as patient is at risk for refeeding syndrome.  Tawnya Crook, PharmD 06/13/2019,2:40 PM

## 2019-06-14 DIAGNOSIS — Z515 Encounter for palliative care: Secondary | ICD-10-CM

## 2019-06-14 LAB — BASIC METABOLIC PANEL
Anion gap: 6 (ref 5–15)
BUN: 48 mg/dL — ABNORMAL HIGH (ref 8–23)
CO2: 25 mmol/L (ref 22–32)
Calcium: 7.2 mg/dL — ABNORMAL LOW (ref 8.9–10.3)
Chloride: 118 mmol/L — ABNORMAL HIGH (ref 98–111)
Creatinine, Ser: 1.19 mg/dL — ABNORMAL HIGH (ref 0.44–1.00)
GFR calc Af Amer: 51 mL/min — ABNORMAL LOW (ref >60)
GFR calc non Af Amer: 44 mL/min — ABNORMAL LOW (ref >60)
Glucose, Bld: 176 mg/dL — ABNORMAL HIGH (ref 70–99)
Potassium: 3.6 mmol/L (ref 3.5–5.1)
Sodium: 149 mmol/L — ABNORMAL HIGH (ref 135–145)

## 2019-06-14 LAB — MAGNESIUM: Magnesium: 2.5 mg/dL — ABNORMAL HIGH (ref 1.7–2.4)

## 2019-06-14 LAB — PHOSPHORUS: Phosphorus: 3.3 mg/dL (ref 2.5–4.6)

## 2019-06-14 LAB — GLUCOSE, CAPILLARY
Glucose-Capillary: 181 mg/dL — ABNORMAL HIGH (ref 70–99)
Glucose-Capillary: 164 mg/dL — ABNORMAL HIGH (ref 70–99)

## 2019-06-14 MED ORDER — LORAZEPAM 2 MG/ML IJ SOLN
1.0000 mg | INTRAMUSCULAR | Status: DC | PRN
  Administered 2019-06-14 (×2): 2 mg via INTRAVENOUS
  Filled 2019-06-14 (×2): qty 1

## 2019-06-18 LAB — CULTURE, BLOOD (ROUTINE X 2): Special Requests: ADEQUATE

## 2019-06-21 LAB — CULTURE, BLOOD (ROUTINE X 2): Special Requests: ADEQUATE

## 2019-06-24 NOTE — Death Summary Note (Signed)
Name: Susan [name] MRN: [id number] DOB: 10-18-1940    ADMISSION DATE:  06/18/2019 INITIAL CONSULTATION DATE: 06/10/2019  REFERRING MD : Dr. Windell Moment   FOLLOW UP:Ventilator dependence, acute on chronic respiratory failure with hypoxia.  BRIEF PATIENT DESCRIPTION:  79 yo female recently treated for COVID-19 pneumonia now admitted with septic shock secondary to diverticular disease with perforation s/p hartmann's procedure remained  intubated mechanically ventilated postop.  She has acute on chronic respiratory failure with hypoxia.  Underlying COPD with chronic oxygen use.  SIGNIFICANT EVENTS/STUDIES:  01/16: CT Abd Pelvis revealed free air and fluid throughout the abdomen compatible with bowel perforation. Extensive inflammatory changes about the colon beginning at the splenic flexure. Changes are most profound at the sigmoid colon, consistent with diverticulitis. This is most likely source of perforation. Diffuse colonic diverticulosis. Atherosclerosis. Stable low-density lesions in the liver. These are likely benign. Patchy bilateral airspace disease likely reflects atelectasis or infection. Multilevel degenerative changes and scoliosis of the lumbar spine.  01/16: General surgery consulted pt transported for emergent surgery 01/16: Pt admitted with diverticular disease with perforation s/p hartmann's procedure remained mechanically ventilated postop, hemodynamics tenuous 01/17:Remains on ventilator, started Dopamine 1/18: -atelectasis bilaterally, leukocytosis poss due to steroids, background of ILD per pulm. 1/19-  Weaning trial today. Still no bowel sounds remains NPO, vitals stable with improved hemodynamics off pressors and good urine output.  1/20 - repeat weaning trial today, secretions improved, will discuss with surgery Relistor to move GI tract & nourishment plan & Diflucan need. Plan for extubation if possible today. If mentation not improved will image brain for CVA.  1/21  - CTH with large sub-acute CVA, discussed with neurology - poor prognosis.  Discussed with POA daughter Reubin Milan, Palliative is on case, goals of care discussion today.  1/22- patient was discussed with PALS team, family meeting today with [place] discussion.  Code status changed to comfort care with plan for compassionate weaning from MV.   Patient was noted to become profoundly bradycardic and apneic.  She was evaluated by both RNs Casey Burkitt and Eyvonne Left.  Time of death 34. Patient is examined at bedside with absence of vital signs. May she rest in peace.   Prior to Admission medications   Medication Sig Start Date End Date Taking? Authorizing Provider  ascorbic acid (VITAMIN C) 500 MG tablet Take 1 tablet (500 mg total) by mouth daily. 06/05/19   Nicole Kindred A, DO  budesonide-formoterol (SYMBICORT) 160-4.5 MCG/ACT inhaler TAKE 2 PUFFS INTO LUNGS TWICE A DAY Patient taking differently: Inhale 2 puffs into the lungs 2 (two) times daily.  05/28/19   Bedsole, Amy E, MD  dexamethasone (DECADRON) 6 MG tablet Take 1 tablet (6 mg total) by mouth daily for 7 days. 06/05/19 06/12/19  Ezekiel Slocumb, DO  diltiazem (CARDIZEM CD) 180 MG 24 hr capsule Take 1 capsule (180 mg total) by mouth daily. 03/05/19   Minna Merritts, MD  donepezil (ARICEPT) 5 MG tablet Take 5 mg by mouth daily.  05/08/19 05/07/20  [provider]  feeding supplement, ENSURE ENLIVE, (ENSURE ENLIVE) LIQD Take 237 mLs by mouth 3 (three) times daily between meals. 06/05/19   Ezekiel Slocumb, DO  guaiFENesin-dextromethorphan (ROBITUSSIN DM) 100-10 MG/5ML syrup Take 10 mLs by mouth every 4 (four) hours as needed for cough. 06/05/19   Ezekiel Slocumb, DO  Multiple Vitamin (MULTIVITAMIN WITH MINERALS) TABS tablet Take 1 tablet by mouth daily. 06/05/19   Ezekiel Slocumb, DO  OXYGEN Inhale 2  L into the lungs daily.    [provider]  rosuvastatin (CRESTOR) 5 MG tablet Take 1 tablet (5 mg total) by mouth  daily. 03/05/19   Minna Merritts, MD  tiotropium (SPIRIVA) 18 MCG inhalation capsule Place 1 capsule (18 mcg total) into inhaler and inhale daily. 05/28/19   Bedsole, Amy E, MD  traZODone (DESYREL) 50 MG tablet TAKE 1 & 1/2 TABLETS BY MOUTH AT [facility] NEEDED FOR SLEEP Patient taking differently: Take 75 mg by mouth at bedtime as needed for sleep.  04/13/18   Bedsole, Amy E, MD  ZOLOFT 25 MG tablet Take 25 mg by mouth daily.  04/10/19   [provider]   No Known Allergies   REVIEW OF SYSTEMS:   Unable to assess, pt intubated   SUBJECTIVE:  Unable to assess, pt. intubated   VITAL SIGNS: Temp:  [97.9 F (36.6 C)-101.6 F (38.7 C)] 101.6 F (38.7 C) (01/22 0845) Pulse Rate:  [53-117] 105 (01/22 1500) Resp:  [13-24] 13 (01/22 1500) BP: (96-123)/(43-58) 96/43 (01/22 1100) SpO2:  [53 %-95 %] 53 % (01/22 1500) FiO2 (%):  [30 %] 30 % (01/22 0900)  PHYSICAL EXAMINATION: General:chronically on  acutely ill appearing female, on ventilator, sedated, asynchronous when sedation lightened Neuro: sedated, not following commands, PERRL  HEENT: Orotracheally intubated, neck supple, trachea midline no crepitus Cardiovascular: sinus tach, no R/G , telemetry with PVCs Lungs: diminished throughout, even, non labored  Abdomen: no audible BS, soft, midline incision with wound vac intact, colostomy present  Musculoskeletal: normal bulk and tone, no edema  Skin: midline incision with wound vac intact, colostomy present    Recent Labs  Lab 06/11/19 0513 06/13/19 0330 15-Jun-2019 0314  NA 147* 154* 149*  K 4.1 3.5 3.6  CL 116* 120* 118*  CO2 21* 26 25  BUN 73* 53* 48*  CREATININE 1.73* 1.22* 1.19*  GLUCOSE 166* 158* 176*   Recent Labs  Lab 06/11/19 0513 06/11/19 1355 06/13/19 0330  HGB 10.9* 11.5* 9.9*  HCT 35.1* 36.4 31.7*  WBC 13.4* 15.5* 9.2  PLT 125* 124* 108*   CT HEAD WO CONTRAST  Result Date: 06/12/2019 CLINICAL DATA:  Neurological deficit, COVID pneumonia, septic  shock, question stroke, on ventilator for acute on chronic respiratory failure with hypoxia, underlying COPD EXAM: CT HEAD WITHOUT CONTRAST TECHNIQUE: Contiguous axial images were obtained from the base of the skull through the vertex without intravenous contrast. Sagittal and coronal MPR images reconstructed from axial data set. COMPARISON:  06/01/2019 FINDINGS: Brain: Generalized atrophy. New large LEFT MCA territory infarct with diffuse edema of the LEFT hemisphere extending to midline at vertex. LEFT RIGHT midline shift of 5 mm with compression of the LEFT lateral ventricle. Additional infarct RIGHT cerebellar hemisphere. Chronic small vessel ischemic changes of deep cerebral white matter. Curvilinear foci of high attenuation within the LEFT MCA territory infarct in a gyriform pattern consistent with petechial hemorrhage. No extra-axial fluid collections or mass lesion. Vascular: Minimal atherosclerotic calcifications of internal carotid arteries at skull base Skull: Demineralized but intact Sinuses/Orbits: Small air-fluid levels within sphenoid sinus. Partial opacification of RIGHT mastoid air cells. Other: N/A IMPRESSION: Large subacute LEFT MCA territory infarct with scattered gyriform petechial hemorrhage and 5 mm of LEFT to RIGHT midline shift. Additional new infarct RIGHT cerebellar hemisphere. Findings called to [facility] in ICU on 06/12/2019 at 1811 hours. Electronically Signed   By: Lavonia Dana M.D.   On: 06/12/2019 18:13  Ottie Glazier, M.D.  Pulmonary & [place]

## 2019-06-24 NOTE — Progress Notes (Signed)
Palliative: Mrs. Taffe is Covid positive, viewed through the glass door to her ICU room.  She remains acutely/chronically ill, intubated/ventilated.  She is worsened over the last few days.  Call to daughter, Reubin Milan.  [name] shares that her brother and sister were able to see Mrs. Charette yesterday evening, and she had a video chat.  [name] shares that family is ready to unburden Mrs. Holness from life support, let nature take its course.  He shares that they have said their goodbyes.  States that they do not need a video chat for extubation, instead wanting to remember Mrs. Sturtevant as she was.   Conference with attending, bedside nursing staff, respiratory therapy related to family's desire for compassionate extubation, comfort measures only.  Orders adjusted.  Plan: Full comfort care.  Compassionate extubation.   Prognosis: Hours to days, Anticipate hospital death.  55 minutes, extended time Quinn Axe, NP Palliative Medicine Team Team Phone # [phone] Greater than 50% of this time was spent counseling and coordinating care related to the above assessment and plan.

## 2019-06-24 NOTE — Progress Notes (Signed)
Name: Susan [name] MRN: [id number] DOB: [birth year]    ADMISSION DATE:  06/01/2019 INITIAL CONSULTATION DATE: 05/29/2019  REFERRING MD : Dr. Windell Moment   FOLLOW UP:Ventilator dependence, acute on chronic respiratory failure with hypoxia.  BRIEF PATIENT DESCRIPTION:  79 yo female recently treated for COVID-19 pneumonia now admitted with septic shock secondary to diverticular disease with perforation s/p hartmann's procedure remained  intubated mechanically ventilated postop.  She has acute on chronic respiratory failure with hypoxia.  Underlying COPD with chronic oxygen use.  SIGNIFICANT EVENTS/STUDIES:  01/16: CT Abd Pelvis revealed free air and fluid throughout the abdomen compatible with bowel perforation. Extensive inflammatory changes about the colon beginning at the splenic flexure. Changes are most profound at the sigmoid colon, consistent with diverticulitis. This is most likely source of perforation. Diffuse colonic diverticulosis. Atherosclerosis. Stable low-density lesions in the liver. These are likely benign. Patchy bilateral airspace disease likely reflects atelectasis or infection. Multilevel degenerative changes and scoliosis of the lumbar spine.  01/16: General surgery consulted pt transported for emergent surgery 01/16: Pt admitted with diverticular disease with perforation s/p hartmann's procedure remained mechanically ventilated postop, hemodynamics tenuous 01/17:Remains on ventilator, started Dopamine 1/18: -atelectasis bilaterally, leukocytosis poss due to steroids, background of ILD per pulm. 1/19-  Weaning trial today. Still no bowel sounds remains NPO, vitals stable with improved hemodynamics off pressors and good urine output.  1/20 - repeat weaning trial today, secretions improved, will discuss with surgery Relistor to move GI tract & nourishment plan & Diflucan need. Plan for extubation if possible today. If mentation not improved will image brain for CVA.  1/21  - CTH with large sub-acute CVA, discussed with neurology - poor prognosis.  Discussed with POA daughter Reubin Milan, Palliative is on case, goals of care discussion today.  1/22- patient was discussed with PALS team, family meeting today with [place] discussion.  Code status changed to comfort care with plan for compassionate weaning from MV.   Prior to Admission medications   Medication Sig Start Date End Date Taking? Authorizing Provider  ascorbic acid (VITAMIN C) 500 MG tablet Take 1 tablet (500 mg total) by mouth daily. 06/05/19   Nicole Kindred A, DO  budesonide-formoterol (SYMBICORT) 160-4.5 MCG/ACT inhaler TAKE 2 PUFFS INTO LUNGS TWICE A DAY Patient taking differently: Inhale 2 puffs into the lungs 2 (two) times daily.  05/28/19   Bedsole, Amy E, MD  dexamethasone (DECADRON) 6 MG tablet Take 1 tablet (6 mg total) by mouth daily for 7 days. 06/05/19 06/12/19  Ezekiel Slocumb, DO  diltiazem (CARDIZEM CD) 180 MG 24 hr capsule Take 1 capsule (180 mg total) by mouth daily. 03/05/19   Minna Merritts, MD  donepezil (ARICEPT) 5 MG tablet Take 5 mg by mouth daily.  05/08/19 05/07/20  [provider]  feeding supplement, ENSURE ENLIVE, (ENSURE ENLIVE) LIQD Take 237 mLs by mouth 3 (three) times daily between meals. 06/05/19   Ezekiel Slocumb, DO  guaiFENesin-dextromethorphan (ROBITUSSIN DM) 100-10 MG/5ML syrup Take 10 mLs by mouth every 4 (four) hours as needed for cough. 06/05/19   Ezekiel Slocumb, DO  Multiple Vitamin (MULTIVITAMIN WITH MINERALS) TABS tablet Take 1 tablet by mouth daily. 06/05/19   Nicole Kindred A, DO  OXYGEN Inhale 2 L into the lungs daily.    [provider]  rosuvastatin (CRESTOR) 5 MG tablet Take 1 tablet (5 mg total) by mouth daily. 03/05/19   Minna Merritts, MD  tiotropium (SPIRIVA) 18 MCG inhalation capsule Place 1 capsule (  18 mcg total) into inhaler and inhale daily. 05/28/19   Bedsole, Amy E, MD  traZODone (DESYREL) 50 MG tablet TAKE 1 & 1/2 TABLETS BY  MOUTH AT [address] [facility] NEEDED FOR SLEEP Patient taking differently: Take 75 mg by mouth at bedtime as needed for sleep.  04/13/18   Bedsole, Amy E, MD  ZOLOFT 25 MG tablet Take 25 mg by mouth daily.  04/10/19   [provider]   No Known Allergies   REVIEW OF SYSTEMS:   Unable to assess, pt intubated   SUBJECTIVE:  Unable to assess, pt. intubated   VITAL SIGNS: Temp:  [97.9 F (36.6 C)-101.6 F (38.7 C)] 101.6 F (38.7 C) (01/22 0845) Pulse Rate:  [48-113] 54 (01/22 1100) Resp:  [13-24] 16 (01/22 1100) BP: (96-123)/(43-58) 96/43 (01/22 1100) SpO2:  [91 %-95 %] 94 % (01/22 1100) FiO2 (%):  [30 %] 30 % (01/22 0900)  PHYSICAL EXAMINATION: General:chronically on  acutely ill appearing female, on ventilator, sedated, asynchronous when sedation lightened Neuro: sedated, not following commands, PERRL  HEENT: Orotracheally intubated, neck supple, trachea midline no crepitus Cardiovascular: sinus tach, no R/G , telemetry with PVCs Lungs: diminished throughout, even, non labored  Abdomen: no audible BS, soft, midline incision with wound vac intact, colostomy present  Musculoskeletal: normal bulk and tone, no edema  Skin: midline incision with wound vac intact, colostomy present    Recent Labs  Lab 06/11/19 0513 06/13/19 0330 Jul 09, 2019 0314  NA 147* 154* 149*  K 4.1 3.5 3.6  CL 116* 120* 118*  CO2 21* 26 25  BUN 73* 53* 48*  CREATININE 1.73* 1.22* 1.19*  GLUCOSE 166* 158* 176*   Recent Labs  Lab 06/11/19 0513 06/11/19 1355 06/13/19 0330  HGB 10.9* 11.5* 9.9*  HCT 35.1* 36.4 31.7*  WBC 13.4* 15.5* 9.2  PLT 125* 124* 108*   CT HEAD WO CONTRAST  Result Date: 06/12/2019 CLINICAL DATA:  Neurological deficit, COVID pneumonia, septic shock, question stroke, on ventilator for acute on chronic respiratory failure with hypoxia, underlying COPD EXAM: CT HEAD WITHOUT CONTRAST TECHNIQUE: Contiguous axial images were obtained from the base of the skull through the vertex  without intravenous contrast. Sagittal and coronal MPR images reconstructed from axial data set. COMPARISON:  06/01/2019 FINDINGS: Brain: Generalized atrophy. New large LEFT MCA territory infarct with diffuse edema of the LEFT hemisphere extending to midline at vertex. LEFT RIGHT midline shift of 5 mm with compression of the LEFT lateral ventricle. Additional infarct RIGHT cerebellar hemisphere. Chronic small vessel ischemic changes of deep cerebral white matter. Curvilinear foci of high attenuation within the LEFT MCA territory infarct in a gyriform pattern consistent with petechial hemorrhage. No extra-axial fluid collections or mass lesion. Vascular: Minimal atherosclerotic calcifications of internal carotid arteries at skull base Skull: Demineralized but intact Sinuses/Orbits: Small air-fluid levels within sphenoid sinus. Partial opacification of RIGHT mastoid air cells. Other: N/A IMPRESSION: Large subacute LEFT MCA territory infarct with scattered gyriform petechial hemorrhage and 5 mm of LEFT to RIGHT midline shift. Additional new infarct RIGHT cerebellar hemisphere. Findings called to [facility] in ICU on 06/12/2019 at 1811 hours. Electronically Signed   By: Lavonia Dana M.D.   On: 06/12/2019 18:13   DG Chest Port 1 View  Result Date: 06/12/2019 CLINICAL DATA:  Status post PICC line placement.  COVID-19 positive. EXAM: PORTABLE CHEST 1 VIEW COMPARISON:  06/10/2019 FINDINGS: Left PICC line tip at mid SVC. Patient rotated right. Nasogastric tube extends beyond the inferior aspect of the film. Endotracheal tube terminates  3.7 cm above carina. Borderline cardiomegaly. Tortuous thoracic aorta. No pleural effusion or pneumothorax. Extremely low lung volumes, with resolved pulmonary interstitial prominence. Similar right greater than left infrahilar airspace disease and volume loss. IMPRESSION: Left PICC line terminating at mid SVC, without pneumothorax. Otherwise, relatively similar appearance of the chest with  low lung volumes and bibasilar/infrahilar airspace disease, favoring atelectasis. Electronically Signed   By: Abigail Miyamoto M.D.   On: 06/12/2019 15:21         ASSESSMENT / PLAN:   Large subacute CVA  - discussed with neurology - appreciate input  - not tPA candidate s/p Harmans procedure  - family discourse today regarding goals of care  - overal poor prognosis  - Palliative on case - appreciate input   - asa/statin given   - PT/OT post GOC if indicated  -now on comfort measures with plan for compassionate weaning from MV  Acute hypercapnic respiratory failure secondary to recent COVID-19 pneumonia and sedating medications  Mechanical intubation  Hx: OSA, Asthma, COPD, ILD, and Morbid Obesity  Full vent support for now-vent settings reviewed with RT SBT once all parameters met  VAP bundle implemented  Scheduled and prn bronchodilator therapy  IV steroid: stopped  Maintain airborne and contact precautions    Hypotension secondary to septic shock and hypovolemia -resolved Hx: Non-obstructive CAD, and Chronic diastolic CHF Continuous telemetry monitoring  Levophed for vasopressor support-no longer indicated as BP is wnl Hold outpatient cardizem Check 2D echo-grade 1 diastolic CHF   Acute renal failure secondary to septic shock  Lactic acidosis  Trend BMP and lactic acid  Replace electrolytes as indicated  Monitor UOP Avoid nephrotoxic medications     Transaminitis secondary to sepsis  Trend hepatic panel    Septic shock secondary to diverticular disease with perforation s/p Hartmann's procedure-06/06/2019 Trend WBC and monitor fever curve  Trend PCT  Follow cultures  Continue zosyn, add fluconazole -e. Fecalis + urine culture-pansensitive   Steroid induced hyperglycemia  CBG's q4hrs  SSI   Postop pain  Mechanical ventilation pain/discomfort  Hx: Stroke, Cerebral Amyloid Angiopathy, and Mixed Alzheimer's and Vascular Dementia Maintain RASS goal -1 to  -2 Propofol and fentanyl gtts to maintain RASS goal and for pain management  WUA daily This issue will add complexity to her management and weaning abilities  Best Practice: VTE px: SCD's SUP px: iv protonix  Diet: keep NPO for now will defer to general surgery    Critical care provider statement:    Critical care time (minutes):  33   Critical care time was exclusive of:  Separately billable procedures and  treating other patients   Critical care was necessary to treat or prevent imminent or  life-threatening deterioration of the following conditions:  COVID19, ILD, diverticular abcess s/p harmans procedure, acute hypoxemic respiratory failure.    Critical care was time spent personally by me on the following  activities:  Development of treatment plan with patient or surrogate,  discussions with consultants, evaluation of patient's response to  treatment, examination of patient, obtaining history from patient or  surrogate, ordering and performing treatments and interventions, ordering  and review of laboratory studies and re-evaluation of patient's condition   I assumed direction of critical care for this patient from another  provider in my specialty: no       Ottie Glazier, M.D.  Pulmonary & [place]

## 2019-06-24 NOTE — Progress Notes (Signed)
Patient transitioned to comfort care at this time with assistance of palliative care. Family discussions held, they did not want to be present during this process. Patient terminally extubated.  1210: Patient resting comfortably at this time.

## 2019-06-24 DEATH — deceased

## 2019-07-09 ENCOUNTER — Encounter: Payer: Self-pay | Admitting: *Deleted
